# Patient Record
Sex: Female | Born: 1945 | Race: White | Hispanic: No | Marital: Married | Smoking: Never smoker
Health system: Southern US, Community
[De-identification: ages and names within clinical notes are randomized; demographics above are authoritative.]

## PROBLEM LIST (undated history)

## (undated) ENCOUNTER — Emergency Department (HOSPITAL_BASED_OUTPATIENT_CLINIC_OR_DEPARTMENT_OTHER): Admission: EM | Payer: Medicare HMO

## (undated) DIAGNOSIS — I340 Nonrheumatic mitral (valve) insufficiency: Secondary | ICD-10-CM

## (undated) DIAGNOSIS — E7800 Pure hypercholesterolemia, unspecified: Secondary | ICD-10-CM

## (undated) DIAGNOSIS — J3489 Other specified disorders of nose and nasal sinuses: Secondary | ICD-10-CM

## (undated) DIAGNOSIS — R51 Headache: Secondary | ICD-10-CM

## (undated) DIAGNOSIS — F329 Major depressive disorder, single episode, unspecified: Secondary | ICD-10-CM

## (undated) DIAGNOSIS — G4730 Sleep apnea, unspecified: Secondary | ICD-10-CM

## (undated) DIAGNOSIS — I10 Essential (primary) hypertension: Secondary | ICD-10-CM

## (undated) DIAGNOSIS — I2510 Atherosclerotic heart disease of native coronary artery without angina pectoris: Secondary | ICD-10-CM

## (undated) DIAGNOSIS — F0390 Unspecified dementia without behavioral disturbance: Secondary | ICD-10-CM

## (undated) DIAGNOSIS — E119 Type 2 diabetes mellitus without complications: Secondary | ICD-10-CM

## (undated) DIAGNOSIS — E039 Hypothyroidism, unspecified: Secondary | ICD-10-CM

## (undated) DIAGNOSIS — M1990 Unspecified osteoarthritis, unspecified site: Secondary | ICD-10-CM

## (undated) DIAGNOSIS — K219 Gastro-esophageal reflux disease without esophagitis: Secondary | ICD-10-CM

## (undated) DIAGNOSIS — F418 Other specified anxiety disorders: Secondary | ICD-10-CM

## (undated) DIAGNOSIS — I700 Atherosclerosis of aorta: Secondary | ICD-10-CM

## (undated) DIAGNOSIS — R011 Cardiac murmur, unspecified: Secondary | ICD-10-CM

## (undated) HISTORY — DX: Atherosclerotic heart disease of native coronary artery without angina pectoris: I25.10

## (undated) HISTORY — PX: BREAST CYST EXCISION: SHX579

---

## 1997-07-13 ENCOUNTER — Observation Stay (HOSPITAL_COMMUNITY): Admission: EM | Admit: 1997-07-13 | Discharge: 1997-07-14 | Payer: Self-pay | Admitting: Emergency Medicine

## 1998-01-17 ENCOUNTER — Encounter: Payer: Self-pay | Admitting: Gastroenterology

## 1998-01-17 ENCOUNTER — Ambulatory Visit (HOSPITAL_COMMUNITY): Admission: RE | Admit: 1998-01-17 | Discharge: 1998-01-17 | Payer: Self-pay | Admitting: Gastroenterology

## 1998-02-07 ENCOUNTER — Ambulatory Visit (HOSPITAL_COMMUNITY): Admission: RE | Admit: 1998-02-07 | Discharge: 1998-02-07 | Payer: Self-pay

## 1998-02-28 ENCOUNTER — Ambulatory Visit (HOSPITAL_COMMUNITY): Admission: RE | Admit: 1998-02-28 | Discharge: 1998-03-01 | Payer: Self-pay

## 2010-10-15 ENCOUNTER — Other Ambulatory Visit: Payer: Self-pay | Admitting: Internal Medicine

## 2010-10-15 DIAGNOSIS — G44009 Cluster headache syndrome, unspecified, not intractable: Secondary | ICD-10-CM

## 2010-10-17 ENCOUNTER — Ambulatory Visit
Admission: RE | Admit: 2010-10-17 | Discharge: 2010-10-17 | Disposition: A | Discharge status: Home / Self Care | Payer: BC Managed Care – PPO | Source: Ambulatory Visit | Attending: Internal Medicine | Admitting: Internal Medicine

## 2010-10-17 DIAGNOSIS — G44009 Cluster headache syndrome, unspecified, not intractable: Secondary | ICD-10-CM

## 2010-12-05 ENCOUNTER — Encounter (HOSPITAL_COMMUNITY): Payer: Self-pay

## 2011-02-06 ENCOUNTER — Encounter (HOSPITAL_COMMUNITY): Payer: Self-pay

## 2011-02-07 NOTE — H&P (Signed)
Danielle [name] is an 66 y.o. female.    Chief Complaint: left Hip OA and pain   HPI: Pt is a 66 y.o. female complaining of left hip pain for 5 years. Pain had continually increased since the beginning, significantly increasing over the last couple of weeks. X-rays in the clinic show end-stage arthritic changes of the left hip. Pt has tried various conservative treatments which have failed to alleviate their symptoms including intraarticular injections and NSAIDs. Various options are discussed with the patient. Risks, benefits and expectations were discussed with the patient. Patient understand the risks, benefits and expectations and wishes to proceed with surgery.   PCP:  No primary provider on file.  D/C Plans:  Home with HHPT  Post-op Meds:   Rx given for ASA, Robaxin, Iron, Colace and MiraLax  Tranexamic Acid:  To be given  PMH: HTN Anxiety / Depression Sleep Apnea Glaucoma Heart Murmur GERD Thyroid Disease   PSH: T&A Hysterectomy, partial Removal of ovaries,  fibroid tumors Appendectomy Discectomy C5-C6 Cholecystectomy Right THA   Social History: Denies the use of tobacco. Admits to the rare alcohol drink.  Allergies:  Allergies  Allergen Reactions  . Codeine Nausea Only  . Compazine (Prochlorperazine) Swelling and Other (See Comments)    Face and tongue swelling  Ceftin - rash  Medications: No current facility-administered medications for this encounter.   Current Outpatient Prescriptions  Medication Sig Dispense Refill  . atenolol (TENORMIN) 25 MG tablet Take 25 mg by mouth every morning.        . calcium-vitamin D (OSCAL WITH D) 500-200 MG-UNIT per tablet Take 1 tablet by mouth 2 (two) times daily.        . cetirizine (ZYRTEC) 10 MG tablet Take 10 mg by mouth daily as needed. For allergies       . DULoxetine (CYMBALTA) 30 MG capsule Take 30 mg by mouth every morning.        . hydrochlorothiazide (MICROZIDE) 12.5 MG capsule Take 12.5 mg by mouth every  morning.        Marland Kitchen HYDROcodone-acetaminophen (VICODIN) 5-500 MG per tablet Take 1 tablet by mouth every 6 (six) hours as needed. For hip pain       . levothyroxine (SYNTHROID, LEVOTHROID) 112 MCG tablet Take 112 mcg by mouth every morning.        . magnesium oxide (MAG-OX) 400 MG tablet Take 400 mg by mouth 2 (two) times daily.        Marland Kitchen omeprazole (PRILOSEC) 20 MG capsule Take 20 mg by mouth daily.        . simvastatin (ZOCOR) 40 MG tablet Take 40 mg by mouth every evening.        . travoprost, benzalkonium, (TRAVATAN) 0.004 % ophthalmic solution Place 1 drop into both eyes at bedtime.         ROS: Review of Systems  Constitutional: Negative.   HENT: Negative.   Eyes: Negative.   Respiratory: Positive for shortness of breath (on exertion).   Cardiovascular: Positive for palpitations.  Gastrointestinal: Positive for heartburn.  Genitourinary: Negative.   Musculoskeletal: Positive for joint pain.  Skin: Negative.   Neurological: Negative.   Endo/Heme/Allergies: Negative.   Psychiatric/Behavioral: Negative.      Physican Exam: Physical Exam  Constitutional: She is oriented to person, place, and time and well-developed, well-nourished, and in no distress.  HENT:  Head: Normocephalic and atraumatic.  Nose: Nose normal.  Mouth/Throat: Oropharynx is clear and moist.  Eyes: Pupils are equal, round, and  reactive to light.  Neck: Neck supple. No JVD present. No tracheal deviation present. No thyromegaly present.  Cardiovascular: Normal rate, regular rhythm and intact distal pulses.   Murmur heard. Pulmonary/Chest: Effort normal and breath sounds normal. No stridor.  Abdominal: Soft. There is no tenderness. There is no guarding.  Musculoskeletal:       Left hip: She exhibits decreased range of motion (Good internal rotation. Decreased external rotation, abduction and flexion.), decreased strength, tenderness and bony tenderness. She exhibits no swelling.  Lymphadenopathy:    She has no  cervical adenopathy.  Neurological: She is alert and oriented to person, place, and time.  Skin: Skin is warm and dry. No rash noted. No erythema. No pallor.  Psychiatric: Affect normal.     Assessment/Plan Assessment: left Hip OA and pain   Plan: Patient will undergo a left total hip arthroplasty, anterior approach on 02/17/2011. Risks benefits and expectation were discussed with the patient. Patient understand risks, benefits and expectation and wishes to proceed.   Anastasio Auerbach Braileigh Landenberger   PAC  02/07/2011, 7:29 PM

## 2011-02-11 ENCOUNTER — Inpatient Hospital Stay (HOSPITAL_COMMUNITY): Admission: RE | Admit: 2011-02-11 | Payer: BC Managed Care – PPO | Source: Ambulatory Visit

## 2011-02-13 ENCOUNTER — Encounter (HOSPITAL_COMMUNITY): Payer: Self-pay

## 2011-02-13 ENCOUNTER — Encounter (HOSPITAL_COMMUNITY)
Admission: RE | Admit: 2011-02-13 | Discharge: 2011-02-13 | Disposition: A | Discharge status: Home / Self Care | Payer: Medicare Other | Source: Ambulatory Visit | Attending: Orthopedic Surgery | Admitting: Orthopedic Surgery

## 2011-02-13 ENCOUNTER — Ambulatory Visit (HOSPITAL_COMMUNITY)
Admission: RE | Admit: 2011-02-13 | Discharge: 2011-02-13 | Disposition: A | Discharge status: Home / Self Care | Payer: Medicare Other | Source: Ambulatory Visit | Attending: Orthopedic Surgery | Admitting: Orthopedic Surgery

## 2011-02-13 ENCOUNTER — Other Ambulatory Visit: Payer: Self-pay

## 2011-02-13 DIAGNOSIS — K219 Gastro-esophageal reflux disease without esophagitis: Secondary | ICD-10-CM

## 2011-02-13 DIAGNOSIS — Z01810 Encounter for preprocedural cardiovascular examination: Secondary | ICD-10-CM | POA: Insufficient documentation

## 2011-02-13 DIAGNOSIS — I10 Essential (primary) hypertension: Secondary | ICD-10-CM | POA: Diagnosis present

## 2011-02-13 DIAGNOSIS — Z01818 Encounter for other preprocedural examination: Secondary | ICD-10-CM | POA: Diagnosis not present

## 2011-02-13 DIAGNOSIS — R011 Cardiac murmur, unspecified: Secondary | ICD-10-CM

## 2011-02-13 DIAGNOSIS — Z01812 Encounter for preprocedural laboratory examination: Secondary | ICD-10-CM | POA: Diagnosis not present

## 2011-02-13 DIAGNOSIS — M1990 Unspecified osteoarthritis, unspecified site: Secondary | ICD-10-CM

## 2011-02-13 DIAGNOSIS — E039 Hypothyroidism, unspecified: Secondary | ICD-10-CM

## 2011-02-13 DIAGNOSIS — G4730 Sleep apnea, unspecified: Secondary | ICD-10-CM

## 2011-02-13 DIAGNOSIS — M169 Osteoarthritis of hip, unspecified: Secondary | ICD-10-CM | POA: Diagnosis not present

## 2011-02-13 DIAGNOSIS — Z01811 Encounter for preprocedural respiratory examination: Secondary | ICD-10-CM | POA: Diagnosis not present

## 2011-02-13 DIAGNOSIS — J3489 Other specified disorders of nose and nasal sinuses: Secondary | ICD-10-CM

## 2011-02-13 DIAGNOSIS — M1610 Unilateral primary osteoarthritis, unspecified hip: Secondary | ICD-10-CM | POA: Insufficient documentation

## 2011-02-13 DIAGNOSIS — G4733 Obstructive sleep apnea (adult) (pediatric): Secondary | ICD-10-CM | POA: Diagnosis present

## 2011-02-13 DIAGNOSIS — R51 Headache: Secondary | ICD-10-CM

## 2011-02-13 HISTORY — DX: Unspecified osteoarthritis, unspecified site: M19.90

## 2011-02-13 HISTORY — DX: Headache: R51

## 2011-02-13 HISTORY — DX: Sleep apnea, unspecified: G47.30

## 2011-02-13 HISTORY — DX: Other specified disorders of nose and nasal sinuses: J34.89

## 2011-02-13 HISTORY — PX: GUM SURGERY: SHX658

## 2011-02-13 HISTORY — PX: JOINT REPLACEMENT: SHX530

## 2011-02-13 HISTORY — PX: CERVICAL FUSION: SHX112

## 2011-02-13 HISTORY — DX: Essential (primary) hypertension: I10

## 2011-02-13 HISTORY — DX: Gastro-esophageal reflux disease without esophagitis: K21.9

## 2011-02-13 HISTORY — DX: Cardiac murmur, unspecified: R01.1

## 2011-02-13 HISTORY — PX: ABDOMINAL HYSTERECTOMY: SHX81

## 2011-02-13 HISTORY — DX: Hypothyroidism, unspecified: E03.9

## 2011-02-13 HISTORY — PX: CHOLECYSTECTOMY: SHX55

## 2011-02-13 HISTORY — PX: APPENDECTOMY: SHX54

## 2011-02-13 HISTORY — PX: TONSILLECTOMY: SUR1361

## 2011-02-13 HISTORY — PX: TUBAL LIGATION: SHX77

## 2011-02-13 LAB — DIFFERENTIAL
Basophils Absolute: 0.1 10*3/uL (ref 0.0–0.1)
Eosinophils Relative: 4 % (ref 0–5)
Lymphocytes Relative: 40 % (ref 12–46)
Neutro Abs: 2.6 10*3/uL (ref 1.7–7.7)
Neutrophils Relative %: 47 % (ref 43–77)

## 2011-02-13 LAB — URINE MICROSCOPIC-ADD ON

## 2011-02-13 LAB — URINALYSIS, ROUTINE W REFLEX MICROSCOPIC
Bilirubin Urine: NEGATIVE
Hgb urine dipstick: NEGATIVE
Ketones, ur: NEGATIVE mg/dL
Specific Gravity, Urine: 1.019 (ref 1.005–1.030)
Urobilinogen, UA: 0.2 mg/dL (ref 0.0–1.0)

## 2011-02-13 LAB — CBC
MCV: 88.3 fL (ref 78.0–100.0)
Platelets: 275 10*3/uL (ref 150–400)
RBC: 4.69 MIL/uL (ref 3.87–5.11)
RDW: 13.2 % (ref 11.5–15.5)
WBC: 5.4 10*3/uL (ref 4.0–10.5)

## 2011-02-13 LAB — BASIC METABOLIC PANEL
Calcium: 9.8 mg/dL (ref 8.4–10.5)
Creatinine, Ser: 0.64 mg/dL (ref 0.50–1.10)
GFR calc Af Amer: >90 mL/min (ref >90)
GFR calc non Af Amer: >90 mL/min (ref >90)
Sodium: 138 mEq/L (ref 135–145)

## 2011-02-13 LAB — PROTIME-INR
INR: 0.91 (ref 0.00–1.49)
Prothrombin Time: 12.4 seconds (ref 11.6–15.2)

## 2011-02-13 LAB — APTT: aPTT: 34 seconds (ref 24–37)

## 2011-02-13 LAB — SURGICAL PCR SCREEN: Staphylococcus aureus: NEGATIVE

## 2011-02-13 MED ORDER — CHLORHEXIDINE GLUCONATE 4 % EX LIQD
60.0000 mL | Freq: Once | CUTANEOUS | Status: DC
  Filled 2011-02-13: qty 60

## 2011-02-13 NOTE — Patient Instructions (Signed)
21 Danielle [name]  02/13/2011   Your procedure is scheduled on: 02-17-11  Report to [facility] at   0630     AM.  Call this number if you have problems the morning of surgery: [phone]   Remember:   Do not eat food:After Midnight.  May have clear liquids:up to 6 Hours before arrival. 11 Midnight.  Clear liquids include soda, tea, black coffee, apple or grape juice, broth.  Take these medicines the morning of surgery with A SIP OF WATER: Atenolol, Zyrtec, Cymbalta, Levothyroxine, Prislosec. Use and bring Travatan eye drops. Bring Cpap mask (not machine).   Do not wear jewelry, make-up or nail polish.  Do not wear lotions, powders, or perfumes. You may wear deodorant.  Do not shave 48 hours prior to surgery.  Do not bring valuables to the hospital.  Contacts, dentures or bridgework may not be worn into surgery.  Leave suitcase in the car. After surgery it may be brought to your room.  For patients admitted to the hospital, checkout time is 11:00 AM the day of discharge.   Patients discharged the day of surgery will not be allowed to drive home.  Name and phone number of your driver: fiancee or son  Special Instructions: CHG Shower Use Special Wash: 1/2 bottle night before surgery and 1/2 bottle morning of surgery.   Please read over the following fact sheets that you were given: MRSA Information, Blood Transfusion fact sheet , Incentive Spirometry Instruction

## 2011-02-13 NOTE — Pre-Procedure Instructions (Addendum)
02-13-11 EKG/ CXR done today. 02-13-11 Dr. Leta Jungling given update per phone-pt. States previous intubation issues with past surgeries(sleep apnea,cervical fusion,small mouth)-no specifics given.Pt. Does desire spinal anesthesia. Dr. Leta Jungling says pt. Can be seen AM of

## 2011-02-16 DIAGNOSIS — E785 Hyperlipidemia, unspecified: Secondary | ICD-10-CM | POA: Diagnosis not present

## 2011-02-16 DIAGNOSIS — E039 Hypothyroidism, unspecified: Secondary | ICD-10-CM | POA: Diagnosis not present

## 2011-02-17 ENCOUNTER — Encounter (HOSPITAL_COMMUNITY)
Admission: RE | Disposition: A | Discharge status: Home Health | Payer: Self-pay | Source: Ambulatory Visit | Attending: Orthopedic Surgery

## 2011-02-17 ENCOUNTER — Inpatient Hospital Stay (HOSPITAL_COMMUNITY): Payer: Medicare Other | Admitting: Anesthesiology

## 2011-02-17 ENCOUNTER — Ambulatory Visit (HOSPITAL_COMMUNITY): Payer: Medicare Other

## 2011-02-17 ENCOUNTER — Inpatient Hospital Stay (HOSPITAL_COMMUNITY)
Admission: RE | Admit: 2011-02-17 | Discharge: 2011-02-19 | DRG: 470 | Disposition: A | Discharge status: Home Health | Payer: Medicare Other | Source: Ambulatory Visit | Attending: Orthopedic Surgery | Admitting: Orthopedic Surgery

## 2011-02-17 ENCOUNTER — Encounter (HOSPITAL_COMMUNITY): Payer: Self-pay | Admitting: Anesthesiology

## 2011-02-17 ENCOUNTER — Encounter (HOSPITAL_COMMUNITY): Payer: Self-pay | Admitting: *Deleted

## 2011-02-17 DIAGNOSIS — M1610 Unilateral primary osteoarthritis, unspecified hip: Secondary | ICD-10-CM | POA: Diagnosis not present

## 2011-02-17 DIAGNOSIS — F341 Dysthymic disorder: Secondary | ICD-10-CM | POA: Diagnosis present

## 2011-02-17 DIAGNOSIS — Z96649 Presence of unspecified artificial hip joint: Secondary | ICD-10-CM

## 2011-02-17 DIAGNOSIS — H409 Unspecified glaucoma: Secondary | ICD-10-CM | POA: Diagnosis present

## 2011-02-17 DIAGNOSIS — Z79899 Other long term (current) drug therapy: Secondary | ICD-10-CM

## 2011-02-17 DIAGNOSIS — M169 Osteoarthritis of hip, unspecified: Principal | ICD-10-CM | POA: Diagnosis present

## 2011-02-17 DIAGNOSIS — R011 Cardiac murmur, unspecified: Secondary | ICD-10-CM | POA: Diagnosis present

## 2011-02-17 DIAGNOSIS — G4730 Sleep apnea, unspecified: Secondary | ICD-10-CM | POA: Diagnosis not present

## 2011-02-17 DIAGNOSIS — I10 Essential (primary) hypertension: Secondary | ICD-10-CM | POA: Diagnosis not present

## 2011-02-17 DIAGNOSIS — E039 Hypothyroidism, unspecified: Secondary | ICD-10-CM | POA: Diagnosis present

## 2011-02-17 DIAGNOSIS — Z471 Aftercare following joint replacement surgery: Secondary | ICD-10-CM | POA: Diagnosis not present

## 2011-02-17 HISTORY — PX: TOTAL HIP ARTHROPLASTY: SHX124

## 2011-02-17 LAB — TYPE AND SCREEN: Antibody Screen: NEGATIVE

## 2011-02-17 SURGERY — ARTHROPLASTY, HIP, TOTAL, ANTERIOR APPROACH
Anesthesia: Spinal | Site: Hip | Laterality: Left | Wound class: Clean

## 2011-02-17 MED ORDER — PANTOPRAZOLE SODIUM 40 MG PO TBEC
40.0000 mg | DELAYED_RELEASE_TABLET | Freq: Every day | ORAL | Status: DC
  Administered 2011-02-18 – 2011-02-19 (×2): 40 mg via ORAL
  Filled 2011-02-17 (×2): qty 1

## 2011-02-17 MED ORDER — FENTANYL CITRATE 0.05 MG/ML IJ SOLN
INTRAMUSCULAR | Status: DC | PRN
  Administered 2011-02-17: 100 ug via INTRAVENOUS

## 2011-02-17 MED ORDER — CEFAZOLIN SODIUM-DEXTROSE 2-3 GM-% IV SOLR: 2.0000 g | Freq: Once | INTRAVENOUS | Status: DC

## 2011-02-17 MED ORDER — ONDANSETRON HCL 4 MG/2ML IJ SOLN
INTRAMUSCULAR | Status: DC | PRN
  Administered 2011-02-17: 4 mg via INTRAVENOUS

## 2011-02-17 MED ORDER — PROMETHAZINE HCL 25 MG/ML IJ SOLN: 6.2500 mg | INTRAMUSCULAR | Status: DC | PRN

## 2011-02-17 MED ORDER — METHOCARBAMOL 100 MG/ML IJ SOLN
500.0000 mg | Freq: Four times a day (QID) | INTRAVENOUS | Status: DC | PRN
  Administered 2011-02-17: 500 mg via INTRAVENOUS
  Filled 2011-02-17: qty 5

## 2011-02-17 MED ORDER — LIDOCAINE HCL (CARDIAC) 20 MG/ML IV SOLN
INTRAVENOUS | Status: DC | PRN
  Administered 2011-02-17: 100 mg via INTRAVENOUS

## 2011-02-17 MED ORDER — ACETAMINOPHEN 10 MG/ML IV SOLN
INTRAVENOUS | Status: DC | PRN
  Administered 2011-02-17: 1000 mg via INTRAVENOUS

## 2011-02-17 MED ORDER — HETASTARCH-ELECTROLYTES 6 % IV SOLN
INTRAVENOUS | Status: DC | PRN
  Administered 2011-02-17: 10:00:00 via INTRAVENOUS

## 2011-02-17 MED ORDER — KETAMINE HCL 10 MG/ML IJ SOLN
INTRAMUSCULAR | Status: DC | PRN
  Administered 2011-02-17 (×2): 10 mg via INTRAVENOUS

## 2011-02-17 MED ORDER — PROPOFOL 10 MG/ML IV BOLUS
INTRAVENOUS | Status: DC | PRN
  Administered 2011-02-17: 20 mg via INTRAVENOUS

## 2011-02-17 MED ORDER — ZOLPIDEM TARTRATE 5 MG PO TABS: 5.0000 mg | ORAL_TABLET | Freq: Every evening | ORAL | Status: DC | PRN

## 2011-02-17 MED ORDER — ONDANSETRON HCL 4 MG/2ML IJ SOLN: 4.0000 mg | Freq: Four times a day (QID) | INTRAMUSCULAR | Status: DC | PRN

## 2011-02-17 MED ORDER — MIDAZOLAM HCL 5 MG/5ML IJ SOLN
INTRAMUSCULAR | Status: DC | PRN
  Administered 2011-02-17: 1 mg via INTRAVENOUS
  Administered 2011-02-17: 2 mg via INTRAVENOUS
  Administered 2011-02-17: 1 mg via INTRAVENOUS

## 2011-02-17 MED ORDER — RIVAROXABAN 10 MG PO TABS
10.0000 mg | ORAL_TABLET | ORAL | Status: DC
  Administered 2011-02-18 – 2011-02-19 (×2): 10 mg via ORAL
  Filled 2011-02-17 (×2): qty 1

## 2011-02-17 MED ORDER — HYDROMORPHONE HCL PF 1 MG/ML IJ SOLN
INTRAMUSCULAR | Status: AC
  Administered 2011-02-18: 1 mg via INTRAVENOUS
  Filled 2011-02-17: qty 1

## 2011-02-17 MED ORDER — CLINDAMYCIN PHOSPHATE 600 MG/50ML IV SOLN
INTRAVENOUS | Status: DC | PRN
  Administered 2011-02-17: 600 mg via INTRAVENOUS

## 2011-02-17 MED ORDER — HYDROCODONE-ACETAMINOPHEN 7.5-325 MG PO TABS
1.0000 | ORAL_TABLET | ORAL | Status: DC
  Administered 2011-02-18 – 2011-02-19 (×8): 2 via ORAL
  Filled 2011-02-17 (×8): qty 2

## 2011-02-17 MED ORDER — EPHEDRINE SULFATE 50 MG/ML IJ SOLN
INTRAMUSCULAR | Status: DC | PRN
  Administered 2011-02-17 (×3): 5 mg via INTRAVENOUS

## 2011-02-17 MED ORDER — MENTHOL 3 MG MT LOZG
1.0000 | LOZENGE | OROMUCOSAL | Status: DC | PRN
  Administered 2011-02-17: 3 mg via ORAL
  Filled 2011-02-17: qty 9

## 2011-02-17 MED ORDER — DIPHENHYDRAMINE HCL 25 MG PO CAPS
25.0000 mg | ORAL_CAPSULE | Freq: Four times a day (QID) | ORAL | Status: DC | PRN
  Administered 2011-02-18 – 2011-02-19 (×2): 25 mg via ORAL
  Filled 2011-02-17 (×2): qty 1

## 2011-02-17 MED ORDER — METHOCARBAMOL 500 MG PO TABS
500.0000 mg | ORAL_TABLET | Freq: Four times a day (QID) | ORAL | Status: DC | PRN
  Administered 2011-02-17 – 2011-02-19 (×2): 500 mg via ORAL
  Filled 2011-02-17 (×2): qty 1

## 2011-02-17 MED ORDER — METOCLOPRAMIDE HCL 10 MG PO TABS: 5.0000 mg | ORAL_TABLET | Freq: Three times a day (TID) | ORAL | Status: DC | PRN

## 2011-02-17 MED ORDER — PHENOL 1.4 % MT LIQD
1.0000 | OROMUCOSAL | Status: DC | PRN
  Filled 2011-02-17: qty 177

## 2011-02-17 MED ORDER — METOCLOPRAMIDE HCL 5 MG/ML IJ SOLN: 5.0000 mg | Freq: Three times a day (TID) | INTRAMUSCULAR | Status: DC | PRN

## 2011-02-17 MED ORDER — HYDROCODONE-ACETAMINOPHEN 7.5-325 MG PO TABS
1.0000 | ORAL_TABLET | ORAL | Status: DC
  Administered 2011-02-17 (×2): 2 via ORAL
  Filled 2011-02-17 (×2): qty 2

## 2011-02-17 MED ORDER — LORATADINE 10 MG PO TABS
10.0000 mg | ORAL_TABLET | Freq: Every day | ORAL | Status: DC
  Administered 2011-02-18 – 2011-02-19 (×2): 10 mg via ORAL
  Filled 2011-02-17 (×3): qty 1

## 2011-02-17 MED ORDER — FERROUS SULFATE 325 (65 FE) MG PO TABS
325.0000 mg | ORAL_TABLET | Freq: Three times a day (TID) | ORAL | Status: DC
  Administered 2011-02-17 – 2011-02-19 (×7): 325 mg via ORAL
  Filled 2011-02-17 (×8): qty 1

## 2011-02-17 MED ORDER — DOCUSATE SODIUM 100 MG PO CAPS
100.0000 mg | ORAL_CAPSULE | Freq: Two times a day (BID) | ORAL | Status: DC
  Administered 2011-02-17 – 2011-02-19 (×5): 100 mg via ORAL
  Filled 2011-02-17 (×6): qty 1

## 2011-02-17 MED ORDER — HYDROMORPHONE HCL PF 1 MG/ML IJ SOLN
0.2500 mg | INTRAMUSCULAR | Status: DC | PRN
  Administered 2011-02-17 (×2): 0.5 mg via INTRAVENOUS

## 2011-02-17 MED ORDER — CLINDAMYCIN PHOSPHATE 600 MG/50ML IV SOLN
600.0000 mg | Freq: Four times a day (QID) | INTRAVENOUS | Status: AC
  Administered 2011-02-17 – 2011-02-18 (×3): 600 mg via INTRAVENOUS
  Filled 2011-02-17 (×3): qty 50

## 2011-02-17 MED ORDER — LEVOTHYROXINE SODIUM 112 MCG PO TABS
112.0000 ug | ORAL_TABLET | Freq: Every day | ORAL | Status: DC
  Administered 2011-02-18 – 2011-02-19 (×2): 112 ug via ORAL
  Filled 2011-02-17 (×3): qty 1

## 2011-02-17 MED ORDER — TRANEXAMIC ACID 100 MG/ML IV SOLN
1400.0000 mg | Freq: Once | INTRAVENOUS | Status: AC
  Administered 2011-02-17: 1400 mg via INTRAVENOUS
  Filled 2011-02-17: qty 14

## 2011-02-17 MED ORDER — SODIUM CHLORIDE 0.9 % IV SOLN: INTRAVENOUS | Status: DC

## 2011-02-17 MED ORDER — DEXAMETHASONE SODIUM PHOSPHATE 10 MG/ML IJ SOLN
10.0000 mg | Freq: Once | INTRAMUSCULAR | Status: AC
  Administered 2011-02-18: 10 mg via INTRAVENOUS
  Filled 2011-02-17: qty 1

## 2011-02-17 MED ORDER — ONDANSETRON HCL 4 MG PO TABS: 4.0000 mg | ORAL_TABLET | Freq: Four times a day (QID) | ORAL | Status: DC | PRN

## 2011-02-17 MED ORDER — LACTATED RINGERS IV SOLN
INTRAVENOUS | Status: DC | PRN
  Administered 2011-02-17 (×2): via INTRAVENOUS

## 2011-02-17 MED ORDER — ACETAMINOPHEN 325 MG PO TABS: 650.0000 mg | ORAL_TABLET | Freq: Four times a day (QID) | ORAL | Status: DC | PRN

## 2011-02-17 MED ORDER — ALUM & MAG HYDROXIDE-SIMETH 200-200-20 MG/5ML PO SUSP: 30.0000 mL | ORAL | Status: DC | PRN

## 2011-02-17 MED ORDER — DEXAMETHASONE SODIUM PHOSPHATE 4 MG/ML IJ SOLN
INTRAMUSCULAR | Status: DC | PRN
  Administered 2011-02-17: 10 mg via INTRAVENOUS

## 2011-02-17 MED ORDER — SODIUM CHLORIDE 0.9 % IV SOLN
100.0000 mL/h | INTRAVENOUS | Status: DC
  Administered 2011-02-17 – 2011-02-18 (×3): 100 mL/h via INTRAVENOUS
  Filled 2011-02-17 (×9): qty 1000

## 2011-02-17 MED ORDER — POLYETHYLENE GLYCOL 3350 17 G PO PACK
17.0000 g | PACK | Freq: Two times a day (BID) | ORAL | Status: DC
  Administered 2011-02-17 – 2011-02-19 (×5): 17 g via ORAL
  Filled 2011-02-17 (×6): qty 1

## 2011-02-17 MED ORDER — SIMVASTATIN 40 MG PO TABS
40.0000 mg | ORAL_TABLET | Freq: Every evening | ORAL | Status: DC
  Administered 2011-02-18: 40 mg via ORAL
  Filled 2011-02-17 (×3): qty 1

## 2011-02-17 MED ORDER — ACETAMINOPHEN 650 MG RE SUPP: 650.0000 mg | Freq: Four times a day (QID) | RECTAL | Status: DC | PRN

## 2011-02-17 MED ORDER — BISACODYL 5 MG PO TBEC: 5.0000 mg | DELAYED_RELEASE_TABLET | Freq: Every day | ORAL | Status: DC | PRN

## 2011-02-17 MED ORDER — PROPOFOL 10 MG/ML IV EMUL
INTRAVENOUS | Status: DC | PRN
  Administered 2011-02-17: 50 ug/kg/min via INTRAVENOUS

## 2011-02-17 MED ORDER — DULOXETINE HCL 30 MG PO CPEP
30.0000 mg | ORAL_CAPSULE | Freq: Every day | ORAL | Status: DC
  Administered 2011-02-18 – 2011-02-19 (×2): 30 mg via ORAL
  Filled 2011-02-17 (×3): qty 1

## 2011-02-17 MED ORDER — TRAVOPROST (BAK FREE) 0.004 % OP SOLN
1.0000 [drp] | Freq: Every day | OPHTHALMIC | Status: DC
  Administered 2011-02-17: 1 [drp] via OPHTHALMIC
  Filled 2011-02-17: qty 2.5

## 2011-02-17 MED ORDER — ATENOLOL 25 MG PO TABS
25.0000 mg | ORAL_TABLET | Freq: Every day | ORAL | Status: DC
  Administered 2011-02-19: 25 mg via ORAL
  Filled 2011-02-17 (×3): qty 1

## 2011-02-17 MED ORDER — HYDROCHLOROTHIAZIDE 12.5 MG PO CAPS
12.5000 mg | ORAL_CAPSULE | Freq: Every day | ORAL | Status: DC
  Filled 2011-02-17 (×3): qty 1

## 2011-02-17 MED ORDER — FLEET ENEMA 7-19 GM/118ML RE ENEM: 1.0000 | ENEMA | Freq: Once | RECTAL | Status: AC | PRN

## 2011-02-17 MED ORDER — HYDROMORPHONE HCL PF 1 MG/ML IJ SOLN
0.5000 mg | INTRAMUSCULAR | Status: DC | PRN
  Administered 2011-02-17 – 2011-02-18 (×4): 1 mg via INTRAVENOUS
  Filled 2011-02-17 (×4): qty 1

## 2011-02-17 MED ORDER — CEFAZOLIN SODIUM-DEXTROSE 2-3 GM-% IV SOLR: 2.0000 g | Freq: Four times a day (QID) | INTRAVENOUS | Status: DC

## 2011-02-17 MED ORDER — DEXAMETHASONE SODIUM PHOSPHATE 10 MG/ML IJ SOLN
10.0000 mg | Freq: Once | INTRAMUSCULAR | Status: DC
  Filled 2011-02-17: qty 1

## 2011-02-17 SURGICAL SUPPLY — 42 items
ADH SKN CLS APL DERMABOND .7 (GAUZE/BANDAGES/DRESSINGS) ×1
BAG SPEC THK2 15X12 ZIP CLS (MISCELLANEOUS) ×2
BAG ZIPLOCK 12X15 (MISCELLANEOUS) ×4 IMPLANT
BLADE SAW SGTL 18X1.27X75 (BLADE) ×2 IMPLANT
CELLS DAT CNTRL 66122 CELL SVR (MISCELLANEOUS) ×1 IMPLANT
CLOTH BEACON ORANGE TIMEOUT ST (SAFETY) ×2 IMPLANT
DERMABOND ADVANCED (GAUZE/BANDAGES/DRESSINGS) ×1
DERMABOND ADVANCED .7 DNX12 (GAUZE/BANDAGES/DRESSINGS) ×1 IMPLANT
DRAPE C-ARM 42X72 X-RAY (DRAPES) ×2 IMPLANT
DRAPE STERI IOBAN 125X83 (DRAPES) ×2 IMPLANT
DRAPE U-SHAPE 47X51 STRL (DRAPES) ×6 IMPLANT
DRSG AQUACEL AG ADV 3.5X10 (GAUZE/BANDAGES/DRESSINGS) ×2 IMPLANT
DRSG TEGADERM 4X4.75 (GAUZE/BANDAGES/DRESSINGS) ×1 IMPLANT
DURAPREP 26ML APPLICATOR (WOUND CARE) ×2 IMPLANT
ELECT BLADE TIP CTD 4 INCH (ELECTRODE) ×3 IMPLANT
ELECT REM PT RETURN 9FT ADLT (ELECTROSURGICAL) ×2
ELECTRODE REM PT RTRN 9FT ADLT (ELECTROSURGICAL) ×1 IMPLANT
EVACUATOR 1/8 PVC DRAIN (DRAIN) IMPLANT
FACESHIELD LNG OPTICON STERILE (SAFETY) ×7 IMPLANT
GAUZE SPONGE 2X2 8PLY STRL LF (GAUZE/BANDAGES/DRESSINGS) ×1 IMPLANT
GLOVE BIOGEL PI IND STRL 7.5 (GLOVE) ×1 IMPLANT
GLOVE BIOGEL PI IND STRL 8 (GLOVE) ×1 IMPLANT
GLOVE BIOGEL PI INDICATOR 7.5 (GLOVE) ×1
GLOVE BIOGEL PI INDICATOR 8 (GLOVE) ×1
GLOVE ECLIPSE 8.0 STRL XLNG CF (GLOVE) ×2 IMPLANT
GLOVE ORTHO TXT STRL SZ7.5 (GLOVE) ×4 IMPLANT
GOWN BRE IMP PREV XXLGXLNG (GOWN DISPOSABLE) ×3 IMPLANT
GOWN STRL NON-REIN LRG LVL3 (GOWN DISPOSABLE) ×3 IMPLANT
KIT BASIN OR (CUSTOM PROCEDURE TRAY) ×2 IMPLANT
PACK TOTAL JOINT (CUSTOM PROCEDURE TRAY) ×2 IMPLANT
PADDING CAST COTTON 6X4 STRL (CAST SUPPLIES) ×2 IMPLANT
PENCIL BUTTON HOLSTER BLD 10FT (ELECTRODE) ×1 IMPLANT
RETRACTOR WND ALEXIS 18 MED (MISCELLANEOUS) ×1 IMPLANT
RTRCTR WOUND ALEXIS 18CM MED (MISCELLANEOUS) ×2
SPONGE GAUZE 2X2 STER 10/PKG (GAUZE/BANDAGES/DRESSINGS) ×1
SUCTION FRAZIER 12FR DISP (SUCTIONS) ×2 IMPLANT
SUT MNCRL AB 4-0 PS2 18 (SUTURE) ×2 IMPLANT
SUT VIC AB 1 CT1 36 (SUTURE) ×8 IMPLANT
SUT VIC AB 2-0 CT1 27 (SUTURE) ×4
SUT VIC AB 2-0 CT1 TAPERPNT 27 (SUTURE) ×2 IMPLANT
TOWEL OR 17X26 10 PK STRL BLUE (TOWEL DISPOSABLE) ×4 IMPLANT
TRAY FOLEY CATH 14FRSI W/METER (CATHETERS) ×2 IMPLANT

## 2011-02-17 NOTE — Anesthesia Preprocedure Evaluation (Signed)
Anesthesia Evaluation  Patient identified by MRN, date of birth, ID band Patient awake    Reviewed: Allergy & Precautions, H&P , NPO status , Patient's Chart, lab work & pertinent test results, reviewed documented beta blocker date and time   History of Anesthesia Complications (+) DIFFICULT AIRWAY  Airway  TM Distance: <3 FB Neck ROM: Limited    Dental  (+) Dental Advisory Given   Pulmonary sleep apnea and Continuous Positive Airway Pressure Ventilation ,  clear to auscultation        Cardiovascular hypertension, Pt. on medications Regular  Denies cardiac symptoms   Neuro/Psych Negative Neurological ROS  Negative Psych ROS   GI/Hepatic negative GI ROS, Neg liver ROS,   Endo/Other  Hypothyroidism Thyroid replacement  Renal/GU negative Renal ROS  Genitourinary negative   Musculoskeletal negative musculoskeletal ROS (+)   Abdominal   Peds negative pediatric ROS (+)  Hematology negative hematology ROS (+)   Anesthesia Other Findings Upper front caps  Reproductive/Obstetrics negative OB ROS                           Anesthesia Physical Anesthesia Plan  ASA: III  Anesthesia Plan: Spinal   Post-op Pain Management:    Induction: Intravenous  Airway Management Planned: Mask  Additional Equipment:   Intra-op Plan:   Post-operative Plan:   Informed Consent:   Dental advisory given  Plan Discussed with: CRNA and Surgeon  Anesthesia Plan Comments:         Anesthesia Quick Evaluation

## 2011-02-17 NOTE — Op Note (Signed)
NAME:  Danielle [name]                ACCOUNT NO.: [id number]      MEDICAL RECORD NO.: [id number]      FACILITY:  [facility]      PHYSICIAN:  Durene Romans D  DATE OF BIRTH:  August 30, 1945     DATE OF PROCEDURE:  02/17/2011                                 OPERATIVE REPORT         PREOPERATIVE DIAGNOSIS: Left  hip osteoarthritis.      POSTOPERATIVE DIAGNOSIS:  Left osteoarthritis.      PROCEDURE:  Left total hip replacement through an anterior approach   utilizing DePuy THR system, component size 52 pinnacle cup, a size 36+4 neutral   Altrex liner, a size 2Hi Tri Lock stem with a 36+1.5 delta ceramic   ball.      SURGEON:  Madlyn Frankel. Charlann Boxer, M.D.      ASSISTANT:  Lanney Gins, PA      ANESTHESIA:  General.      SPECIMENS:  None.      COMPLICATIONS:  None.      BLOOD LOSS:  500 cc     DRAINS:  One Hemovac.      INDICATION OF THE PROCEDURE:  Danielle [name] is a 66 y.o. female who had   presented to office for evaluation of left hip pain.  Radiographs revealed   progressive degenerative changes with bone-on-bone   articulation to the  hip joint.  The patient had painful limited range of   motion significantly affecting their overall quality of life.  The patient was failing to    respond to conservative measures, and at this point was ready   to proceed with more definitive measures.  The patient has noted progressive   degenerative changes in his hip, progressive problems and dysfunction   with regarding the hip prior to surgery.  Consent was obtained for   benefit of pain relief.  Specific risk of infection, DVT, component   failure, dislocation, need for revision surgery, as well discussion of   the anterior versus posterior approach were reviewed.  Consent was   obtained for benefit of anterior pain relief through an anterior   approach.      PROCEDURE IN DETAIL:  The patient was brought to operative theater.   Once adequate anesthesia, preoperative antibiotics, 600mg  of  Clindamycin administered.   The patient was positioned supine on the OSI Hanna table.  Once adequate   padding of boney process was carried out, we had predraped out the hip, and  used fluoroscopy to confirm orientation of the pelvis and position.      The left hip was then prepped and draped from proximal iliac crest to   mid thigh with shower curtain technique.      Time-out was performed identifying the patient, planned procedure, and   extremity.     An incision was then made 2 cm distal and lateral to the   anterior superior iliac spine extending over the orientation of the   tensor fascia lata muscle and sharp dissection was carried down to the   fascia of the muscle and protractor placed in the soft tissues.      The fascia was then incised.  The muscle belly was identified and swept   laterally  and retractor placed along the superior neck.  Following   cauterization of the circumflex vessels and removing some pericapsular   fat, a second cobra retractor was placed on the inferior neck.  A third   retractor was placed on the anterior acetabulum after elevating the   anterior rectus.  A L-capsulotomy was along the line of the   superior neck to the trochanteric fossa, then extended proximally and   distally.  Tag sutures were placed and the retractors were then placed   intracapsular.  We then identified the trochanteric fossa and   orientation of my neck cut, confirmed this radiographically   and then made a neck osteotomy with the femur on traction.  The femoral   head was removed without difficulty or complication.  Traction was let   off and retractors were placed posterior and anterior around the   acetabulum.      The labrum and foveal tissue were debrided.  I began reaming with a 45mm   reamer and reamed up to 51mm reamer with good bony bed preparation and a 52mm   cup was chosen.  The final 52 Pinnacle cup was then impacted under fluoroscopy  to confirm the depth of  penetration and orientation with respect to   abduction.  A screw was placed followed by the hole eliminator.  The final   36+4 Altrex liner was impacted with good visualized rim fit.  The cup was positioned anatomically within the acetabular portion of the pelvis.      At this point, the femur was rolled at 80 degrees.  Further capsule was   released off the inferior aspect of the femoral neck.  I then   released the superior capsule proximally.  The hook was placed laterally   along the femur and elevated manually and held in position with the bed   hook.  The leg was then extended and adducted with the leg rolled to 100   degrees of external rotation.  Once the proximal femur was fully   exposed, I used a box osteotome to set orientation.  I then began   broaching with the starting chili pepper broach and passed this by hand and then broached up to 2.  With the 2 broach in place I chose a high offset neck and did a trial reduction.  The offset was appropriate, leg lengths   appeared to be equal, confirmed radiographically.   Given these findings, I went ahead and dislocated the hip, repositioned all   retractors and positioned the right hip in the extended and abducted position.  The final 2Hi  Tri Lock stem was   chosen and it was impacted down to the level of neck cut.  Based on this   and the trial reduction, a 36+1.5 delta ceramic ball was chosen and   impacted onto a clean and dry trunnion, and the hip was reduced.  The   hip had been irrigated throughout the case again at this point.  I did   reapproximate the superior capsular leaflet to the anterior leaflet   using #1 Vicryl, placed a medium Hemovac drain deep.  The fascia of the   tensor fascia lata muscle was then reapproximated using #1 Vicryl.  The   remaining wound was closed with 2-0 Vicryl and running 4-0 Monocryl.   The hip was cleaned, dried, and dressed sterilely using Dermabond and   Aquacel dressing.  Drain site  dressed separately.  She was then brought  to recovery room in stable condition tolerating the procedure well.    Lanney Gins, PA-C was present for the entirety of the case involved from   preoperative positioning, perioperative retractor management, general   facilitation of the case, as well as primary wound closure as assistant.            Madlyn Frankel Charlann Boxer, M.D.            MDO/MEDQ  D:  11/04/2010  T:  11/04/2010  Job:  [id number]      Electronically Signed by Durene Romans M.D. on 11/10/2010 09:15:38 AM

## 2011-02-17 NOTE — Anesthesia Postprocedure Evaluation (Signed)
  Anesthesia Post-op Note  Patient: Danielle [name]  Procedure(s) Performed:  TOTAL HIP ARTHROPLASTY ANTERIOR APPROACH  Patient Location: PACU  Anesthesia Type: Spinal  Level of Consciousness: oriented and sedated  Airway and Oxygen Therapy: Patient Spontanous Breathing and Patient connected to nasal cannula oxygen  Post-op Pain: none  Post-op Assessment: Post-op Vital signs reviewed, Patient's Cardiovascular Status Stable, Respiratory Function Stable and Patent Airway  Post-op Vital Signs: stable  Complications: No apparent anesthesia complications

## 2011-02-17 NOTE — Anesthesia Postprocedure Evaluation (Signed)
  Anesthesia Post-op Note  Patient: Danielle [name]  Procedure(s) Performed:  TOTAL HIP ARTHROPLASTY ANTERIOR APPROACH  Patient Location: PACU  Anesthesia Type: General  Level of Consciousness: oriented and sedated  Airway and Oxygen Therapy: Patient Spontanous Breathing and Patient connected to nasal cannula oxygen  Post-op Pain: mild  Post-op Assessment: Post-op Vital signs reviewed, Patient's Cardiovascular Status Stable, Respiratory Function Stable and Patent Airway  Post-op Vital Signs: stable  Complications: No apparent anesthesia complications

## 2011-02-17 NOTE — Progress Notes (Signed)
Utilization review completed.  

## 2011-02-17 NOTE — Interval H&P Note (Signed)
History and Physical Interval Note:  02/17/2011 8:42 AM  Danielle [name]  has presented today for surgery, with the diagnosis of Osteoarthritis of the Left Hip  The various methods of treatment have been discussed with the patient and family. After consideration of risks, benefits and other options for treatment, the patient has consented to  Procedure(s:LEFT TOTAL HIP ARTHROPLASTY ANTERIOR APPROACH as a surgical intervention .  The patients' history has been reviewed, patient examined, no change in status, stable for surgery.  I have reviewed the patients' chart and labs.  Questions were answered to the patient's satisfaction.     [name]

## 2011-02-17 NOTE — Transfer of Care (Signed)
Immediate Anesthesia Transfer of Care Note  Patient: Danielle [name]  Procedure(s) Performed:  TOTAL HIP ARTHROPLASTY ANTERIOR APPROACH  Patient Location: PACU  Anesthesia Type: Regional  Level of Consciousness: awake and alert   Airway & Oxygen Therapy: Patient Spontanous Breathing and Patient connected to nasal cannula oxygen  Post-op Assessment: Report given to PACU RN and Post -op Vital signs reviewed and stable  Post vital signs: Reviewed and stable  Complications: No apparent anesthesia complications

## 2011-02-17 NOTE — Anesthesia Procedure Notes (Signed)
Spinal  Patient location during procedure: OR Start time: 02/17/2011 9:07 AM End time: 02/17/2011 9:17 AM Staffing Anesthesiologist: Lestine Box B Performed by: anesthesiologist  Preanesthetic Checklist Completed: patient identified, site marked, surgical consent, pre-op evaluation, timeout performed, IV checked, risks and benefits discussed and monitors and equipment checked Spinal Block Patient position: sitting Prep: Betadine Patient monitoring: heart rate, cardiac monitor, continuous pulse ox and blood pressure Approach: right paramedian Location: L3-4 Injection technique: single-shot Needle Needle type: Quincke  Needle gauge: 25 G Needle length: 9 cm Needle insertion depth: 3 cm Assessment Sensory level: T6 Additional Notes [id number], 2014-06

## 2011-02-18 LAB — BASIC METABOLIC PANEL
BUN: 11 mg/dL (ref 6–23)
Calcium: 8.6 mg/dL (ref 8.4–10.5)
Creatinine, Ser: 0.66 mg/dL (ref 0.50–1.10)
GFR calc non Af Amer: >90 mL/min (ref >90)
Glucose, Bld: 134 mg/dL — ABNORMAL HIGH (ref 70–99)
Potassium: 4.9 mEq/L (ref 3.5–5.1)

## 2011-02-18 LAB — CBC
HCT: 32.5 % — ABNORMAL LOW (ref 36.0–46.0)
Hemoglobin: 10.5 g/dL — ABNORMAL LOW (ref 12.0–15.0)
MCH: 29.7 pg (ref 26.0–34.0)
MCHC: 32.3 g/dL (ref 30.0–36.0)
MCV: 92.1 fL (ref 78.0–100.0)

## 2011-02-18 NOTE — Progress Notes (Signed)
Subjective: 1 Day Post-Op Procedure(s) (LRB): TOTAL HIP ARTHROPLASTY ANTERIOR APPROACH (Left)   Patient reports pain as mild. Pain is well controlled. No events.   Objective:   VITALS:   Filed Vitals:   02/18/11 0830  BP: 96/61  Pulse: 87  Temp: 97.7 F (36.5 C)  Resp: 15    Neurovascular intact Dorsiflexion/Plantar flexion intact Incision: dressing C/D/I No cellulitis present Compartment soft  LABS  Basename 02/18/11 0437  HGB 10.5*  HCT 32.5*  WBC 11.9*  PLT 231     Basename 02/18/11 0437  NA 135  K 4.9  BUN 11  CREATININE 0.66  GLUCOSE 134*     Assessment/Plan: 1 Day Post-Op Procedure(s) (LRB): TOTAL HIP ARTHROPLASTY ANTERIOR APPROACH (Left)   Foley cath d/'ed HV drain d/c'ed Advance diet Up with therapy D/C IV fluids Plan for discharge tomorrow to home if continues to do well.   Danielle [name]   PAC  02/18/2011, 9:00 AM

## 2011-02-18 NOTE — Progress Notes (Signed)
Physical Therapy Treatment Patient Details Name: Danielle [name] MRN: [id number] DOB: 1945-12-31 Today's Date: 02/18/2011  PT Assessment/Plan  PT - Assessment/Plan Comments on Treatment Session: pt feeling better this pm PT Frequency: 7X/week Follow Up Recommendations: Home health PT Equipment Recommended: None recommended by PT PT Goals  Acute Rehab PT Goals Pt will go Supine/Side to Sit: with modified independence PT Goal: Supine/Side to Sit - Progress: Progressing toward goal Pt will go Sit to Supine/Side: with modified independence PT Goal: Sit to Supine/Side - Progress: Progressing toward goal Pt will go Sit to Stand: with modified independence PT Goal: Sit to Stand - Progress: Progressing toward goal Pt will go Stand to Sit: with modified independence PT Goal: Stand to Sit - Progress: Progressing toward goal Pt will Ambulate: 51 - 150 feet;with rolling walker;with supervision;with modified independence PT Goal: Ambulate - Progress: Progressing toward goal  PT Treatment Precautions/Restrictions  Precautions Precautions:  (NONE) Restrictions Weight Bearing Restrictions: No LLE Weight Bearing: Weight bearing as tolerated Mobility (including Balance) Bed Mobility Supine to Sit: 4: Min assist;HOB flat Supine to Sit Details (indicate cue type and reason): min with LLE Sitting - Scoot to Edge of Bed: 5: Supervision Sit to Supine: HOB flat;4: Min assist Sit to Supine - Details (indicate cue type and reason): min assist with LLE Transfers Sit to Stand: 4: Min assist;From chair/3-in-1;With upper extremity assist Sit to Stand Details (indicate cue type and reason): cues for hand placement Stand to Sit: 4: Min assist;With upper extremity assist;To bed Stand to Sit Details: cues for hand placement Ambulation/Gait Ambulation/Gait Assistance: 4: Min assist;5: Supervision Ambulation/Gait Assistance Details (indicate cue type and reason): verbal cues for RW placement and  sequence Ambulation Distance (Feet): 240 Feet Assistive device: Rolling walker Gait Pattern: Step-to pattern;Antalgic Gait velocity: slow but increased from am    Exercise    End of Session PT - End of Session Equipment Utilized During Treatment: Gait belt Activity Tolerance: Patient tolerated treatment well Patient left: in bed;with call bell in reach;with family/visitor present Nurse Communication: Mobility status for transfers;Mobility status for ambulation General Behavior During Session: [facility] for tasks performed  [facility] 02/18/2011, 3:37 PM

## 2011-02-18 NOTE — Evaluation (Signed)
Physical Therapy Evaluation Patient Details Name: Danielle [name] MRN: [id number] DOB: Apr 25, 1945 Today's Date: 02/18/2011  Problem List:  Patient Active Problem List  Diagnoses  . S/P left hip replacement    Past Medical History:  Past Medical History  Diagnosis Date  . Difficult intubation 02-13-11    with surgery -multiple times  . Glaucoma 02-13-11    bil. tx. eye drops daily  . Sleep apnea 02-13-11    Hx. sleep apnea-uses cpap since 10'12  . Heart murmur 02-13-11    was told-no issues with this  . Sinus drainage 02-13-11    uses Zyrtec as needed  . Hypothyroidism 02-13-11    tx. with Levothyroxine  . GERD (gastroesophageal reflux disease) 02-13-11    tx. Omeprazole  . Headache 02-13-11    past hx. migraines, none recent  . Arthritis 02-13-11    osteoarthritis, hips, knees,s/p Cervical fusion(DDD)  . Hypertension 02-13-11    tx. Atenolol   Past Surgical History:  Past Surgical History  Procedure Date  . Abdominal hysterectomy 02-13-11  . Joint replacement 02-13-11    RTHA-a yr ago in [place]  . Cervical fusion 02-13-11    '92- retained hardware  . Cholecystectomy 02-13-11    '98-lap. galbladder removal due to stones  . Tubal ligation 02-13-11  . Appendectomy 02-13-11    Lap. removal '92  . Tonsillectomy 02-13-11    child  . Gum surgery 02-13-11    "small mouth"    PT Assessment/Plan/Recommendation PT Assessment Clinical Impression Statement: pt limited by nausea and dizziness this am; will benefit from PT to maximize independence for home PT Recommendation/Assessment: Patient will need skilled PT in the acute care venue PT Problem List: Decreased strength;Decreased range of motion;Decreased activity tolerance;Decreased mobility;Decreased knowledge of use of DME Barriers to Discharge: None PT Therapy Diagnosis : Difficulty walking PT Plan PT Frequency: 7X/week PT Treatment/Interventions: DME instruction;Gait training;Stair training;Functional mobility training;Therapeutic  activities;Therapeutic exercise;Patient/family education PT Recommendation Follow Up Recommendations: Home health PT Equipment Recommended: None recommended by PT PT Goals  Acute Rehab PT Goals PT Goal Formulation: With patient Time For Goal Achievement: 6 days Pt will go Supine/Side to Sit: with modified independence PT Goal: Supine/Side to Sit - Progress: Goal set today Pt will go Sit to Supine/Side: with modified independence PT Goal: Sit to Supine/Side - Progress: Goal set today Pt will go Sit to Stand: with modified independence PT Goal: Sit to Stand - Progress: Goal set today Pt will go Stand to Sit: with modified independence PT Goal: Stand to Sit - Progress: Goal set today Pt will Ambulate: 51 - 150 feet;with rolling walker;with supervision;with modified independence PT Goal: Ambulate - Progress: Goal set today Pt will Go Up / Down Stairs: 1-2 stairs;with min assist;with rolling walker PT Goal: Up/Down Stairs - Progress: Goal set today Pt will Perform Home Exercise Program: with supervision, verbal cues required/provided PT Goal: Perform Home Exercise Program - Progress: Goal set today  PT Evaluation Precautions/Restrictions  Precautions Precautions:  (NONE) Restrictions LLE Weight Bearing: Weight bearing as tolerated Prior Functioning  Home Living Lives With:  ( fiance) Receives Help From:  (fiance) Type of Home: Apartment Home Layout: One level Home Access: Other (comment) (curb only) Bathroom Shower/Tub: Tub/shower unit Home Adaptive Equipment: Walker - rolling;Bedside commode/3-in-1;Straight cane Prior Function Level of Independence: Independent with transfers;Requires assistive device for independence;Independent with gait;Independent with basic ADLs (uses cane occasionally) Able to Take Stairs?: Yes Cognition Cognition Arousal/Alertness: Awake/alert Overall Cognitive Status: Appears within functional limits for tasks  assessed Orientation Level: Oriented  X4 Sensation/Coordination   Extremity Assessment RUE Assessment RUE Assessment: Within Functional Limits LUE Assessment LUE Assessment: Within Functional Limits RLE Assessment RLE Assessment: Within Functional Limits (pt reports she does not "trust" R hip since THA 6yr ago) LLE Assessment LLE Assessment:  (AAROM WFL; >3/5hip, knee; ankle WFL) Mobility (including Balance) Bed Mobility Bed Mobility: Yes Supine to Sit: 4: Min assist;HOB flat Supine to Sit Details (indicate cue type and reason): min with LLE Sitting - Scoot to Edge of Bed: 5: Supervision Transfers Transfers: Yes Sit to Stand: 4: Min assist;From bed;From chair/3-in-1;With upper extremity assist Sit to Stand Details (indicate cue type and reason): cues for hand placement Stand to Sit: 4: Min assist;With armrests;With upper extremity assist;To chair/3-in-1 Stand to Sit Details: cues for hand placement Ambulation/Gait Ambulation/Gait: Yes Ambulation/Gait Assistance: 4: Min assist Ambulation/Gait Assistance Details (indicate cue type and reason): verbal cues for RW placement and sequence Ambulation Distance (Feet): 10 Feet (16) Assistive device: Rolling walker Gait Pattern: Step-to pattern;Antalgic Gait velocity: decreased    Exercise  Total Joint Exercises Ankle Circles/Pumps: AROM;10 reps;Both Quad Sets:  (further ther ex deferred d/t nausea) End of Session PT - End of Session Activity Tolerance: Treatment limited secondary to medical complications (Comment);Patient limited by fatigue (dizziness) Patient left: in chair;with call bell in reach;with family/visitor present Nurse Communication:  (nausea and dizziness; BP 101/63 EOB) General Behavior During Session: [facility] for tasks performed Cognition: [facility] for tasks performed  [facility] 02/18/2011, 11:00 AM

## 2011-02-19 LAB — CBC
HCT: 36.7 % (ref 36.0–46.0)
Hemoglobin: 11.8 g/dL — ABNORMAL LOW (ref 12.0–15.0)
MCV: 90.8 fL (ref 78.0–100.0)
RBC: 4.04 MIL/uL (ref 3.87–5.11)
WBC: 13.3 10*3/uL — ABNORMAL HIGH (ref 4.0–10.5)

## 2011-02-19 LAB — BASIC METABOLIC PANEL
BUN: 9 mg/dL (ref 6–23)
CO2: 26 mEq/L (ref 19–32)
Chloride: 106 mEq/L (ref 96–112)
Creatinine, Ser: 0.58 mg/dL (ref 0.50–1.10)
Potassium: 4.7 mEq/L (ref 3.5–5.1)

## 2011-02-19 MED ORDER — ASPIRIN EC 325 MG PO TBEC: 325.0000 mg | DELAYED_RELEASE_TABLET | Freq: Two times a day (BID) | ORAL | Status: AC

## 2011-02-19 MED ORDER — HYDROCODONE-ACETAMINOPHEN 7.5-325 MG PO TABS: 1.0000 | ORAL_TABLET | ORAL | Status: AC

## 2011-02-19 MED ORDER — FERROUS SULFATE 325 (65 FE) MG PO TABS: 325.0000 mg | ORAL_TABLET | Freq: Three times a day (TID) | ORAL | Status: DC

## 2011-02-19 MED ORDER — DIPHENHYDRAMINE HCL 25 MG PO CAPS: 25.0000 mg | ORAL_CAPSULE | Freq: Four times a day (QID) | ORAL | Status: AC | PRN

## 2011-02-19 MED ORDER — METHOCARBAMOL 500 MG PO TABS: 500.0000 mg | ORAL_TABLET | Freq: Four times a day (QID) | ORAL | Status: AC | PRN

## 2011-02-19 MED ORDER — POLYETHYLENE GLYCOL 3350 17 G PO PACK: 17.0000 g | PACK | Freq: Two times a day (BID) | ORAL | Status: AC

## 2011-02-19 MED ORDER — DSS 100 MG PO CAPS: 100.0000 mg | ORAL_CAPSULE | Freq: Two times a day (BID) | ORAL | Status: AC

## 2011-02-19 NOTE — Plan of Care (Signed)
Problem: Consults Goal: Diagnosis- Total Joint Replacement Outcome: Completed/Met Date Met:  02/19/11 Primary Total Hip Left Anterior

## 2011-02-19 NOTE — Progress Notes (Signed)
Subjective: 2 Days Post-Op Procedure(s) (LRB): TOTAL HIP ARTHROPLASTY ANTERIOR APPROACH (Left)   Patient reports pain as mild. Some redness, controlled with Benadryl. Otherwise no events.  Objective:   VITALS:   Filed Vitals:   02/19/11 0615  BP: 146/82  Pulse: 110  Temp: 97.9 F (36.6 C)  Resp: 16    Neurovascular intact Dorsiflexion/Plantar flexion intact Incision: dressing C/D/I No cellulitis present Compartment soft  LABS  Basename 02/19/11 0343 02/18/11 0437  HGB 11.8* 10.5*  HCT 36.7 32.5*  WBC 13.3* 11.9*  PLT 220 231     Basename 02/19/11 0343 02/18/11 0437  NA 140 135  K 4.7 4.9  BUN 9 11  CREATININE 0.58 0.66  GLUCOSE 135* 134*     Assessment/Plan: 2 Days Post-Op Procedure(s) (LRB): TOTAL HIP ARTHROPLASTY ANTERIOR APPROACH (Left)   Up with therapy Discharge home with home health   Anastasio Auerbach. Tyonna Talerico   PAC  02/19/2011, 7:44 AM

## 2011-02-19 NOTE — Progress Notes (Signed)
Physical Therapy Treatment Patient Details Name: Danielle [name] MRN: [id number] DOB: 06-25-1945 Today's Date: 02/19/2011  PT Assessment/Plan  PT - Assessment/Plan Comments on Treatment Session: pt feels ready to  D/C home today, progressing well PT Plan: Discharge plan remains appropriate;Frequency remains appropriate Follow Up Recommendations: Home health PT Equipment Recommended: None recommended by PT PT Goals  Acute Rehab PT Goals Pt will go Sit to Stand: with modified independence PT Goal: Sit to Stand - Progress: Met Pt will go Stand to Sit: with modified independence PT Goal: Stand to Sit - Progress: Met Pt will Ambulate: 51 - 150 feet;with rolling walker;with supervision;with modified independence PT Goal: Ambulate - Progress: Met Pt will Go Up / Down Stairs: 1-2 stairs;with min assist;with rolling walker PT Goal: Up/Down Stairs - Progress: Met Pt will Perform Home Exercise Program: with supervision, verbal cues required/provided PT Goal: Perform Home Exercise Program - Progress: Met  PT Treatment Precautions/Restrictions  Precautions Precautions:  (NONE) Restrictions Weight Bearing Restrictions: Yes LLE Weight Bearing: Weight bearing as tolerated Mobility (including Balance) Transfers Sit to Stand: 5: Supervision;6: Modified independent (Device/Increase time) Stand to Sit: 6: Modified independent (Device/Increase time) Ambulation/Gait Ambulation/Gait Assistance: 5: Supervision;6: Modified independent (Device/Increase time) Ambulation/Gait Assistance Details (indicate cue type and reason): initial cues to push RW Ambulation Distance (Feet): 170 Feet Assistive device: Rolling walker Gait Pattern: Step-through pattern;Step-to pattern Stairs: Yes Stair Management Technique: Backwards;With walker Number of Stairs: 1     Exercise  Total Joint Exercises Ankle Circles/Pumps: AROM;10 reps;Both Heel Slides: AAROM;Left;10 reps End of Session PT - End of  Session Equipment Utilized During Treatment: Gait belt Activity Tolerance: Patient tolerated treatment well Patient left: in chair;with call bell in reach;with family/visitor present General Behavior During Session: [facility] for tasks performed Cognition: [facility] for tasks performed  [facility] 02/19/2011, 9:53 AM

## 2011-02-19 NOTE — Progress Notes (Signed)
  CARE MANAGEMENT NOTE 02/19/2011  Patient:  Danielle [name], Danielle [name]   Account Number:  [id number]  Date Initiated:  02/19/2011  Documentation initiated by:  Colleen Can  Subjective/Objective Assessment:   dx Osteoarthritiis left hip; total hip replacemnt-anterior approach     Action/Plan:   Pt is planning to home in [place] where fiance and son will be caregivers. She already has RW, cane and commode seat. Home Health services were pre-arranged with Genevieve Norlander prior to admission, Choice offered' pt wants [place].   Anticipated DC Date:  02/19/2011   Anticipated DC Plan:  HOME W HOME HEALTH SERVICES  In-house referral  NA      DC Planning Services  CM consult      [facility] Choice  HOME HEALTH   Choice offered to / List presented to:  C-1 Patient   DME arranged  NA      DME agency  NA     HH arranged  HH-2 PT      [facility] agency  [facility]   Status of service:  Completed, signed off Medicare Important Message given?  NA - LOS <3 / Initial given by admissions (If response is "NO", the following Medicare IM given date fields will be blank) Date Medicare IM given:   Date Additional Medicare IM given:    Discharge Disposition:  HOME W HOME HEALTH SERVICES  Per UR Regulation:    Comments:  List of HH agencies placed in shadow chart. HH services will start 02/20/2011.

## 2011-02-19 NOTE — Evaluation (Addendum)
Occupational Therapy Evaluation Patient Details Name: Danielle [name] MRN: [id number] DOB: 27-Jun-1945 Today's Date: 02/19/2011  Problem List:  Patient Active Problem List  Diagnoses  . S/P left hip replacement    Past Medical History:  Past Medical History  Diagnosis Date  . Difficult intubation 02-13-11    with surgery -multiple times  . Glaucoma 02-13-11    bil. tx. eye drops daily  . Sleep apnea 02-13-11    Hx. sleep apnea-uses cpap since 10'12  . Heart murmur 02-13-11    was told-no issues with this  . Sinus drainage 02-13-11    uses Zyrtec as needed  . Hypothyroidism 02-13-11    tx. with Levothyroxine  . GERD (gastroesophageal reflux disease) 02-13-11    tx. Omeprazole  . Headache 02-13-11    past hx. migraines, none recent  . Arthritis 02-13-11    osteoarthritis, hips, knees,s/p Cervical fusion(DDD)  . Hypertension 02-13-11    tx. Atenolol   Past Surgical History:  Past Surgical History  Procedure Date  . Abdominal hysterectomy 02-13-11  . Joint replacement 02-13-11    RTHA-a yr ago in [place]  . Cervical fusion 02-13-11    '92- retained hardware  . Cholecystectomy 02-13-11    '98-lap. galbladder removal due to stones  . Tubal ligation 02-13-11  . Appendectomy 02-13-11    Lap. removal '92  . Tonsillectomy 02-13-11    child  . Gum surgery 02-13-11    "small mouth"    OT Assessment/Plan/Recommendation OT Assessment Clinical Impression Statement: Pt will benefit from skilled OT services to increase independence with ADL prior to discharge.  OT Recommendation/Assessment: Patient will need skilled OT in the acute care venue OT Problem List: Decreased strength;Decreased knowledge of use of DME or AE;Pain OT Therapy Diagnosis : Generalized weakness;Acute pain OT Plan OT Frequency: Min 2X/week OT Treatment/Interventions: Self-care/ADL training;Therapeutic activities;DME and/or AE instruction;Patient/family education OT Recommendation Follow Up Recommendations: No OT follow up Equipment  Recommended: Tub/shower bench Individuals Consulted Consulted and Agree with Results and Recommendations: Patient OT Goals Acute Rehab OT Goals OT Goal Formulation: With patient/family Time For Goal Achievement: 7 days ADL Goals Pt Will Perform Grooming: Standing at sink;with supervision ADL Goal: Grooming - Progress: Goal set today Pt Will Transfer to Toilet: with supervision;Ambulation;with DME;3-in-1 ADL Goal: Toilet Transfer - Progress: Goal set today Pt Will Perform Toileting - Clothing Manipulation: with supervision;Standing ADL Goal: Toileting - Clothing Manipulation - Progress: Goal set today Pt Will Perform Tub/Shower Transfer: supervision withTransfer tub bench ADL Goal: Tub/Shower Transfer - Progress: Goal set today  OT Evaluation Precautions/Restrictions  Precautions Precautions:  (NONE) Restrictions Weight Bearing Restrictions: No LLE Weight Bearing: Weight bearing as tolerated Prior Functioning Home Living Lives With:  ( fiance) Receives Help From: Other (Comment) (fiance) Type of Home: Apartment Home Layout: One level Home Access: Other (comment) (curb only) Bathroom Shower/Tub: Engineer, manufacturing systems: Standard Home Adaptive Equipment: Walker - rolling;Bedside commode/3-in-1;Straight cane Additional Comments: pt states she has a shower chair but it is stored in the back of a storage unit and is not accessible right now. Prior Function Level of Independence: Independent with transfers;Requires assistive device for independence;Independent with gait;Independent with basic ADLs (uses cane occasionally) ADL ADL Eating/Feeding: Simulated;Independent Where Assessed - Eating/Feeding: Chair Grooming: Set up;Wash/dry hands;Performed Where Assessed - Grooming: Sitting, chair Upper Body Bathing: Simulated;Chest;Right arm;Left arm;Abdomen;Set up Where Assessed - Upper Body Bathing: Sitting, bed;Unsupported Lower Body Bathing: Simulated;Minimal  assistance Where Assessed - Lower Body Bathing: Sit to stand from bed Upper Body  Dressing: Simulated;Set up Where Assessed - Upper Body Dressing: Sitting, bed;Unsupported Lower Body Dressing: Simulated;Moderate assistance Lower Body Dressing Details (indicate cue type and reason): has a reacher at home and states she knows how to use it to start pants over legs.  Where Assessed - Lower Body Dressing: Sit to stand from bed Toilet Transfer: Performed;Minimal assistance Toilet Transfer Details (indicate cue type and reason): min guard assist Toilet Transfer Method: Ambulating Toilet Transfer Equipment: Raised toilet seat with arms (or 3-in-1 over toilet);Other (comment) (RW) Toileting - Clothing Manipulation: Simulated;Minimal assistance Toileting - Clothing Manipulation Details (indicate cue type and reason): min guard assist Where Assessed - Toileting Clothing Manipulation: Standing Toileting - Hygiene: Performed;Independent Where Assessed - Toileting Hygiene: Sit on 3-in-1 or toilet Tub/Shower Transfer: Not assessed Tub/Shower Transfer Method: Not assessed Equipment Used: Rolling walker ADL Comments: Pt states she feels alittle weak but did well with activity. Her fiance came in for the end of eval. She would like a tubbech for discharge. Informed Genevieve Norlander who will discuss cost with her. Verbally reviewed and demonstrated technique for tub transfer with bench and pt verbalized  understanding. Pt has a 3in1 already. Fiance can assist with ADL as needed at discharge also. Vision/Perception    Cognition Cognition Arousal/Alertness: Awake/alert Overall Cognitive Status: Appears within functional limits for tasks assessed Orientation Level: Oriented X4 Sensation/Coordination Sensation Light Touch: Appears Intact Extremity Assessment RUE Assessment RUE Assessment: Within Functional Limits LUE Assessment LUE Assessment: Within Functional Limits Mobility  Bed Mobility Bed Mobility:  Yes Supine to Sit: 5: Supervision;HOB elevated (Comment degrees) Sitting - Scoot to Edge of Bed: 5: Supervision Transfers Sit to Stand: 5: Supervision;From bed Stand to Sit: 5: Supervision;To chair/3-in-1 Stand to Sit Details: min verbal cues for hand placement Exercises Total Joint Exercises Ankle Circles/Pumps: AROM;10 reps;Both Heel Slides: AAROM;Left;10 reps End of Session OT - End of Session Equipment Utilized During Treatment: Other (comment) (RW) Activity Tolerance: Patient tolerated treatment well Patient left: in chair;with call bell in reach;with family/visitor present General Behavior During Session: [facility] for tasks performed Cognition: [facility] for tasks performed   Lennox Laity [phone] 02/19/2011, 12:29 PM

## 2011-02-19 NOTE — Progress Notes (Signed)
Prescription for norco given to pt, placed in pt's hands.  D/c instructions given and explained to pt.  Pt stable and ready for d/c.  Pt d/c home with family at this time.

## 2011-02-21 DIAGNOSIS — I10 Essential (primary) hypertension: Secondary | ICD-10-CM | POA: Diagnosis not present

## 2011-02-21 DIAGNOSIS — F329 Major depressive disorder, single episode, unspecified: Secondary | ICD-10-CM | POA: Diagnosis not present

## 2011-02-21 DIAGNOSIS — Z471 Aftercare following joint replacement surgery: Secondary | ICD-10-CM | POA: Diagnosis not present

## 2011-02-21 DIAGNOSIS — M169 Osteoarthritis of hip, unspecified: Secondary | ICD-10-CM | POA: Diagnosis not present

## 2011-02-21 DIAGNOSIS — IMO0001 Reserved for inherently not codable concepts without codable children: Secondary | ICD-10-CM | POA: Diagnosis not present

## 2011-02-21 DIAGNOSIS — Z96649 Presence of unspecified artificial hip joint: Secondary | ICD-10-CM | POA: Diagnosis not present

## 2011-02-23 DIAGNOSIS — Z96649 Presence of unspecified artificial hip joint: Secondary | ICD-10-CM | POA: Diagnosis not present

## 2011-02-23 DIAGNOSIS — F329 Major depressive disorder, single episode, unspecified: Secondary | ICD-10-CM | POA: Diagnosis not present

## 2011-02-23 DIAGNOSIS — I10 Essential (primary) hypertension: Secondary | ICD-10-CM | POA: Diagnosis not present

## 2011-02-23 DIAGNOSIS — Z471 Aftercare following joint replacement surgery: Secondary | ICD-10-CM | POA: Diagnosis not present

## 2011-02-23 DIAGNOSIS — IMO0001 Reserved for inherently not codable concepts without codable children: Secondary | ICD-10-CM | POA: Diagnosis not present

## 2011-02-23 NOTE — Discharge Summary (Signed)
Physician Discharge Summary  Patient ID: Danielle [name] MRN: [id number] DOB/AGE: [id number] 65 y.o.  Admit date: 02/17/2011 Discharge date: 02/19/2011  Procedures:  Procedure(s) (LRB): TOTAL HIP ARTHROPLASTY ANTERIOR APPROACH (Left)  Attending Physician: Dr. Durene Romans  Admission Diagnoses: Left hip OA and pain    Discharge Diagnoses:  Principal Problem:  *S/P left hip replacement HTN  Anxiety / Depression  Sleep Apnea  Glaucoma  Heart Murmur  GERD  Thyroid Disease  HPI: Pt is a 66 y.o. female complaining of left hip pain for 5 years. Pain had continually increased since the beginning, significantly increasing over the last couple of weeks. X-rays in the clinic show end-stage arthritic changes of the left hip. Pt has tried various conservative treatments which have failed to alleviate their symptoms including intraarticular injections and NSAIDs. Various options are discussed with the patient. Risks, benefits and expectations were discussed with the patient. Patient understand the risks, benefits and expectations and wishes to proceed with surgery.   PCP: No primary provider on file.   Discharged Condition: good  Hospital Course:  Patient underwent the above stated procedure on 02/17/2011. Patient tolerated the procedure well and brought to the recovery room in good condition and subsequently to the floor.  POD #1 BP: 96/61 ; Pulse: 87 ; Temp: 97.7 F (36.5 C) ; Resp: 15  Pt's foley was removed, as well as the hemovac drain removed. IV was changed to a saline lock. Patient reports pain as mild. Pain is well controlled. No events. Neurovascular intact, dorsiflexion/plantar flexion intact, incision: dressing C/D/I, no cellulitis present and compartment soft.  LABS  Basename  02/18/11 0437   HGB  10.5*   HCT  32.5*    POD #2  BP: 146/82 ; Pulse: 110 ; Temp: 97.9 F (36.6 C) ; Resp: 16  Patient reports pain as mild. Some redness, controlled with Benadryl. Otherwise no  events. Discharged home with HHPT. Neurovascular intact, dorsiflexion/plantar flexion intact, incision: dressing C/D/I, no cellulitis present and compartment soft.  LABS  Basename  02/19/11 0343   HGB  11.8*   HCT  36.7    Discharge Exam: Extremities: Homans sign is negative, no sign of DVT, no edema, redness or tenderness in the calves or thighs and no ulcers, gangrene or trophic changes  Disposition: Home-Health Care Svc   with follow up in 2 weeks  Follow-up Information    Follow up with OLIN,Laira Penninger D in 2 weeks.   Contact information:   [facility] [address], [address] [place] [address] [phone]          Discharge Orders    Future Orders Please Complete By Expires   Diet - low sodium heart healthy      Call MD / Call 911      Comments:   If you experience chest pain or shortness of breath, CALL 911 and be transported to the hospital emergency room.  If you develope a fever above 101 F, pus (white drainage) or increased drainage or redness at the wound, or calf pain, call your surgeon's office.   Discharge instructions      Comments:   Maintain surgical dressing for 8 days, then replace with gauze and tape. Keep the area dry and clean until follow up. Follow up in 2 weeks at [facility]. Call with any questions or concerns.     Constipation Prevention      Comments:   Drink plenty of fluids.  Prune juice may be helpful.  You may use a stool softener, such as Colace (over the counter) 100 mg twice a day.  Use MiraLax (over the counter) for constipation as needed.   Increase activity slowly as tolerated      Weight Bearing as taught in Physical Therapy      Comments:   Use a walker or crutches as instructed.   Driving restrictions      Comments:   No driving for 4 weeks   Change dressing      Comments:   Maintain surgical dressing for 8 days, then replace with 4x4 guaze and tape. Keep the area dry and clean.    TED hose      Comments:   Use stockings (TED hose) for 2 weeks on both leg(s).  You may remove them at night for sleeping.      Discharge Medication List as of 02/19/2011 12:55 PM    START taking these medications   Details  aspirin EC 325 MG tablet Take 1 tablet (325 mg total) by mouth 2 (two) times daily. X 4 weeks, Starting 02/19/2011, Until Sun 03/01/11, No Print    diphenhydrAMINE (BENADRYL) 25 mg capsule Take 1 capsule (25 mg total) by mouth every 6 (six) hours as needed for itching, allergies or sleep., Starting 02/19/2011, Until Sun 03/01/11, No Print    docusate sodium 100 MG CAPS Take 100 mg by mouth 2 (two) times daily., Starting 02/19/2011, Until Sun 03/01/11, No Print    ferrous sulfate 325 (65 FE) MG tablet Take 1 tablet (325 mg total) by mouth 3 (three) times daily after meals., Starting 02/19/2011, Until Fri 02/19/12, No Print    HYDROcodone-acetaminophen (NORCO) 7.5-325 MG per tablet Take 1-2 tablets by mouth every 4 (four) hours., Starting 02/19/2011, Until Sun 03/01/11, Print    methocarbamol (ROBAXIN) 500 MG tablet Take 1 tablet (500 mg total) by mouth every 6 (six) hours as needed (muscle spasms)., Starting 02/19/2011, Until Sun 03/01/11, No Print    polyethylene glycol (MIRALAX / GLYCOLAX) packet Take 17 g by mouth 2 (two) times daily., Starting 02/19/2011, Until Sun 02/22/11, No Print      CONTINUE these medications which have NOT CHANGED   Details  atenolol (TENORMIN) 25 MG tablet Take 25 mg by mouth every morning.  , Until Discontinued, Historical Med    DULoxetine (CYMBALTA) 30 MG capsule Take 30 mg by mouth every morning.  , Until Discontinued, Historical Med    levothyroxine (SYNTHROID, LEVOTHROID) 112 MCG tablet Take 112 mcg by mouth every morning.  , Until Discontinued, Historical Med    omeprazole (PRILOSEC) 20 MG capsule Take 20 mg by mouth daily.  , Until Discontinued, Historical Med    simvastatin (ZOCOR) 40 MG tablet Take 40 mg by mouth every evening.  , Until  Discontinued, Historical Med    travoprost, benzalkonium, (TRAVATAN) 0.004 % ophthalmic solution Place 1 drop into both eyes at bedtime.  , Until Discontinued, Historical Med    calcium-vitamin D (OSCAL WITH D) 500-200 MG-UNIT per tablet Take 1 tablet by mouth 2 (two) times daily.  , Until Discontinued, Historical Med    cetirizine (ZYRTEC) 10 MG tablet Take 10 mg by mouth daily as needed. For allergies , Until Discontinued, Historical Med    hydrochlorothiazide (MICROZIDE) 12.5 MG capsule Take 12.5 mg by mouth every morning.  , Until Discontinued, Historical Med    magnesium oxide (MAG-OX) 400 MG tablet Take 400 mg by mouth 2 (two) times daily.  , Until Discontinued, Historical Med  STOP taking these medications     HYDROcodone-acetaminophen (VICODIN) 5-500 MG per tablet Comments:  Reason for Stopping:          Signed: Anastasio Auerbach. Ryosuke Ericksen   PAC  02/23/2011, 9:41 AM

## 2011-02-26 DIAGNOSIS — I10 Essential (primary) hypertension: Secondary | ICD-10-CM | POA: Diagnosis not present

## 2011-02-26 DIAGNOSIS — Z471 Aftercare following joint replacement surgery: Secondary | ICD-10-CM | POA: Diagnosis not present

## 2011-02-26 DIAGNOSIS — Z96649 Presence of unspecified artificial hip joint: Secondary | ICD-10-CM | POA: Diagnosis not present

## 2011-02-26 DIAGNOSIS — F329 Major depressive disorder, single episode, unspecified: Secondary | ICD-10-CM | POA: Diagnosis not present

## 2011-02-26 DIAGNOSIS — IMO0001 Reserved for inherently not codable concepts without codable children: Secondary | ICD-10-CM | POA: Diagnosis not present

## 2011-02-27 DIAGNOSIS — IMO0001 Reserved for inherently not codable concepts without codable children: Secondary | ICD-10-CM | POA: Diagnosis not present

## 2011-02-27 DIAGNOSIS — Z96649 Presence of unspecified artificial hip joint: Secondary | ICD-10-CM | POA: Diagnosis not present

## 2011-02-27 DIAGNOSIS — F329 Major depressive disorder, single episode, unspecified: Secondary | ICD-10-CM | POA: Diagnosis not present

## 2011-02-27 DIAGNOSIS — Z471 Aftercare following joint replacement surgery: Secondary | ICD-10-CM | POA: Diagnosis not present

## 2011-02-27 DIAGNOSIS — I10 Essential (primary) hypertension: Secondary | ICD-10-CM | POA: Diagnosis not present

## 2011-03-02 DIAGNOSIS — Z471 Aftercare following joint replacement surgery: Secondary | ICD-10-CM | POA: Diagnosis not present

## 2011-03-02 DIAGNOSIS — I10 Essential (primary) hypertension: Secondary | ICD-10-CM | POA: Diagnosis not present

## 2011-03-02 DIAGNOSIS — IMO0001 Reserved for inherently not codable concepts without codable children: Secondary | ICD-10-CM | POA: Diagnosis not present

## 2011-03-02 DIAGNOSIS — F329 Major depressive disorder, single episode, unspecified: Secondary | ICD-10-CM | POA: Diagnosis not present

## 2011-03-02 DIAGNOSIS — Z96649 Presence of unspecified artificial hip joint: Secondary | ICD-10-CM | POA: Diagnosis not present

## 2011-03-04 DIAGNOSIS — Z96649 Presence of unspecified artificial hip joint: Secondary | ICD-10-CM | POA: Diagnosis not present

## 2011-03-04 DIAGNOSIS — I10 Essential (primary) hypertension: Secondary | ICD-10-CM | POA: Diagnosis not present

## 2011-03-04 DIAGNOSIS — Z471 Aftercare following joint replacement surgery: Secondary | ICD-10-CM | POA: Diagnosis not present

## 2011-03-04 DIAGNOSIS — F329 Major depressive disorder, single episode, unspecified: Secondary | ICD-10-CM | POA: Diagnosis not present

## 2011-03-04 DIAGNOSIS — IMO0001 Reserved for inherently not codable concepts without codable children: Secondary | ICD-10-CM | POA: Diagnosis not present

## 2011-03-05 DIAGNOSIS — IMO0001 Reserved for inherently not codable concepts without codable children: Secondary | ICD-10-CM | POA: Diagnosis not present

## 2011-03-05 DIAGNOSIS — F329 Major depressive disorder, single episode, unspecified: Secondary | ICD-10-CM | POA: Diagnosis not present

## 2011-03-05 DIAGNOSIS — Z471 Aftercare following joint replacement surgery: Secondary | ICD-10-CM | POA: Diagnosis not present

## 2011-03-05 DIAGNOSIS — I10 Essential (primary) hypertension: Secondary | ICD-10-CM | POA: Diagnosis not present

## 2011-03-05 DIAGNOSIS — Z96649 Presence of unspecified artificial hip joint: Secondary | ICD-10-CM | POA: Diagnosis not present

## 2011-03-09 DIAGNOSIS — Z471 Aftercare following joint replacement surgery: Secondary | ICD-10-CM | POA: Diagnosis not present

## 2011-03-09 DIAGNOSIS — I10 Essential (primary) hypertension: Secondary | ICD-10-CM | POA: Diagnosis not present

## 2011-03-09 DIAGNOSIS — F329 Major depressive disorder, single episode, unspecified: Secondary | ICD-10-CM | POA: Diagnosis not present

## 2011-03-09 DIAGNOSIS — IMO0001 Reserved for inherently not codable concepts without codable children: Secondary | ICD-10-CM | POA: Diagnosis not present

## 2011-03-09 DIAGNOSIS — Z96649 Presence of unspecified artificial hip joint: Secondary | ICD-10-CM | POA: Diagnosis not present

## 2011-03-10 DIAGNOSIS — Z96649 Presence of unspecified artificial hip joint: Secondary | ICD-10-CM | POA: Diagnosis not present

## 2011-03-10 DIAGNOSIS — F329 Major depressive disorder, single episode, unspecified: Secondary | ICD-10-CM | POA: Diagnosis not present

## 2011-03-10 DIAGNOSIS — I10 Essential (primary) hypertension: Secondary | ICD-10-CM | POA: Diagnosis not present

## 2011-03-10 DIAGNOSIS — Z471 Aftercare following joint replacement surgery: Secondary | ICD-10-CM | POA: Diagnosis not present

## 2011-03-10 DIAGNOSIS — IMO0001 Reserved for inherently not codable concepts without codable children: Secondary | ICD-10-CM | POA: Diagnosis not present

## 2011-03-12 ENCOUNTER — Encounter (HOSPITAL_COMMUNITY): Payer: Self-pay | Admitting: Orthopedic Surgery

## 2011-03-12 DIAGNOSIS — F329 Major depressive disorder, single episode, unspecified: Secondary | ICD-10-CM | POA: Diagnosis not present

## 2011-03-12 DIAGNOSIS — Z471 Aftercare following joint replacement surgery: Secondary | ICD-10-CM | POA: Diagnosis not present

## 2011-03-12 DIAGNOSIS — Z96649 Presence of unspecified artificial hip joint: Secondary | ICD-10-CM | POA: Diagnosis not present

## 2011-03-12 DIAGNOSIS — IMO0001 Reserved for inherently not codable concepts without codable children: Secondary | ICD-10-CM | POA: Diagnosis not present

## 2011-03-12 DIAGNOSIS — I10 Essential (primary) hypertension: Secondary | ICD-10-CM | POA: Diagnosis not present

## 2011-03-16 DIAGNOSIS — Z96649 Presence of unspecified artificial hip joint: Secondary | ICD-10-CM | POA: Diagnosis not present

## 2011-03-16 DIAGNOSIS — Z471 Aftercare following joint replacement surgery: Secondary | ICD-10-CM | POA: Diagnosis not present

## 2011-03-16 DIAGNOSIS — IMO0001 Reserved for inherently not codable concepts without codable children: Secondary | ICD-10-CM | POA: Diagnosis not present

## 2011-03-16 DIAGNOSIS — F329 Major depressive disorder, single episode, unspecified: Secondary | ICD-10-CM | POA: Diagnosis not present

## 2011-03-16 DIAGNOSIS — I10 Essential (primary) hypertension: Secondary | ICD-10-CM | POA: Diagnosis not present

## 2011-03-18 DIAGNOSIS — F329 Major depressive disorder, single episode, unspecified: Secondary | ICD-10-CM | POA: Diagnosis not present

## 2011-03-18 DIAGNOSIS — IMO0001 Reserved for inherently not codable concepts without codable children: Secondary | ICD-10-CM | POA: Diagnosis not present

## 2011-03-18 DIAGNOSIS — I10 Essential (primary) hypertension: Secondary | ICD-10-CM | POA: Diagnosis not present

## 2011-03-18 DIAGNOSIS — Z471 Aftercare following joint replacement surgery: Secondary | ICD-10-CM | POA: Diagnosis not present

## 2011-03-18 DIAGNOSIS — Z96649 Presence of unspecified artificial hip joint: Secondary | ICD-10-CM | POA: Diagnosis not present

## 2011-03-19 DIAGNOSIS — IMO0001 Reserved for inherently not codable concepts without codable children: Secondary | ICD-10-CM | POA: Diagnosis not present

## 2011-03-19 DIAGNOSIS — I10 Essential (primary) hypertension: Secondary | ICD-10-CM | POA: Diagnosis not present

## 2011-03-19 DIAGNOSIS — Z471 Aftercare following joint replacement surgery: Secondary | ICD-10-CM | POA: Diagnosis not present

## 2011-03-19 DIAGNOSIS — Z96649 Presence of unspecified artificial hip joint: Secondary | ICD-10-CM | POA: Diagnosis not present

## 2011-03-19 DIAGNOSIS — F329 Major depressive disorder, single episode, unspecified: Secondary | ICD-10-CM | POA: Diagnosis not present

## 2011-04-16 DIAGNOSIS — M169 Osteoarthritis of hip, unspecified: Secondary | ICD-10-CM | POA: Diagnosis not present

## 2011-05-14 DIAGNOSIS — G4733 Obstructive sleep apnea (adult) (pediatric): Secondary | ICD-10-CM | POA: Diagnosis not present

## 2011-05-27 DIAGNOSIS — M169 Osteoarthritis of hip, unspecified: Secondary | ICD-10-CM | POA: Diagnosis not present

## 2011-06-01 DIAGNOSIS — M2550 Pain in unspecified joint: Secondary | ICD-10-CM | POA: Diagnosis not present

## 2011-06-01 DIAGNOSIS — R609 Edema, unspecified: Secondary | ICD-10-CM | POA: Diagnosis not present

## 2011-06-01 DIAGNOSIS — R5383 Other fatigue: Secondary | ICD-10-CM | POA: Diagnosis not present

## 2011-06-01 DIAGNOSIS — R5381 Other malaise: Secondary | ICD-10-CM | POA: Diagnosis not present

## 2011-06-01 DIAGNOSIS — M1990 Unspecified osteoarthritis, unspecified site: Secondary | ICD-10-CM | POA: Diagnosis not present

## 2011-06-22 ENCOUNTER — Other Ambulatory Visit: Payer: Self-pay | Admitting: Rheumatology

## 2011-06-22 DIAGNOSIS — M1990 Unspecified osteoarthritis, unspecified site: Secondary | ICD-10-CM | POA: Diagnosis not present

## 2011-06-22 DIAGNOSIS — M064 Inflammatory polyarthropathy: Secondary | ICD-10-CM | POA: Diagnosis not present

## 2011-06-22 DIAGNOSIS — M2550 Pain in unspecified joint: Secondary | ICD-10-CM | POA: Diagnosis not present

## 2011-06-26 ENCOUNTER — Ambulatory Visit
Admission: RE | Admit: 2011-06-26 | Discharge: 2011-06-26 | Disposition: A | Discharge status: Home / Self Care | Payer: No Typology Code available for payment source | Source: Ambulatory Visit | Attending: Rheumatology | Admitting: Rheumatology

## 2011-06-26 DIAGNOSIS — M25579 Pain in unspecified ankle and joints of unspecified foot: Secondary | ICD-10-CM | POA: Diagnosis not present

## 2011-06-26 DIAGNOSIS — M19079 Primary osteoarthritis, unspecified ankle and foot: Secondary | ICD-10-CM | POA: Diagnosis not present

## 2011-06-26 DIAGNOSIS — M064 Inflammatory polyarthropathy: Secondary | ICD-10-CM

## 2011-06-26 DIAGNOSIS — M7730 Calcaneal spur, unspecified foot: Secondary | ICD-10-CM | POA: Diagnosis not present

## 2011-06-26 MED ORDER — GADOBENATE DIMEGLUMINE 529 MG/ML IV SOLN
20.0000 mL | Freq: Once | INTRAVENOUS | Status: AC | PRN
  Administered 2011-06-26: 20 mL via INTRAVENOUS

## 2011-06-29 ENCOUNTER — Other Ambulatory Visit: Payer: Self-pay

## 2011-06-29 ENCOUNTER — Encounter (HOSPITAL_COMMUNITY): Payer: Self-pay

## 2011-06-29 ENCOUNTER — Emergency Department (HOSPITAL_COMMUNITY)
Admission: EM | Admit: 2011-06-29 | Discharge: 2011-06-29 | Disposition: A | Discharge status: Home / Self Care | Payer: Medicare Other | Attending: Emergency Medicine | Admitting: Emergency Medicine

## 2011-06-29 DIAGNOSIS — M7730 Calcaneal spur, unspecified foot: Secondary | ICD-10-CM | POA: Insufficient documentation

## 2011-06-29 DIAGNOSIS — I10 Essential (primary) hypertension: Secondary | ICD-10-CM | POA: Diagnosis not present

## 2011-06-29 DIAGNOSIS — M159 Polyosteoarthritis, unspecified: Secondary | ICD-10-CM | POA: Insufficient documentation

## 2011-06-29 DIAGNOSIS — G4730 Sleep apnea, unspecified: Secondary | ICD-10-CM | POA: Diagnosis not present

## 2011-06-29 DIAGNOSIS — E039 Hypothyroidism, unspecified: Secondary | ICD-10-CM | POA: Insufficient documentation

## 2011-06-29 DIAGNOSIS — R1013 Epigastric pain: Secondary | ICD-10-CM | POA: Diagnosis not present

## 2011-06-29 DIAGNOSIS — K219 Gastro-esophageal reflux disease without esophagitis: Secondary | ICD-10-CM | POA: Insufficient documentation

## 2011-06-29 DIAGNOSIS — R109 Unspecified abdominal pain: Secondary | ICD-10-CM

## 2011-06-29 LAB — COMPREHENSIVE METABOLIC PANEL
ALT: 14 U/L (ref 0–35)
BUN: 12 mg/dL (ref 6–23)
CO2: 26 mEq/L (ref 19–32)
Calcium: 10 mg/dL (ref 8.4–10.5)
Creatinine, Ser: 0.65 mg/dL (ref 0.50–1.10)
GFR calc Af Amer: >90 mL/min (ref >90)
GFR calc non Af Amer: >90 mL/min (ref >90)
Glucose, Bld: 92 mg/dL (ref 70–99)
Sodium: 139 mEq/L (ref 135–145)
Total Protein: 7.2 g/dL (ref 6.0–8.3)

## 2011-06-29 LAB — POCT I-STAT, CHEM 8
BUN: 12 mg/dL (ref 6–23)
Calcium, Ion: 1.29 mmol/L (ref 1.12–1.32)
Chloride: 103 mEq/L (ref 96–112)
Glucose, Bld: 99 mg/dL (ref 70–99)
TCO2: 25 mmol/L (ref 0–100)

## 2011-06-29 LAB — DIFFERENTIAL
Basophils Absolute: 0.1 10*3/uL (ref 0.0–0.1)
Eosinophils Relative: 4 % (ref 0–5)
Lymphocytes Relative: 30 % (ref 12–46)
Monocytes Absolute: 0.8 10*3/uL (ref 0.1–1.0)
Monocytes Relative: 9 % (ref 3–12)

## 2011-06-29 LAB — CBC
HCT: 43.7 % (ref 36.0–46.0)
HCT: 43.1 % (ref 36.0–46.0)
Hemoglobin: 15 g/dL (ref 12.0–15.0)
MCH: 29.5 pg (ref 26.0–34.0)
MCHC: 34.3 g/dL (ref 30.0–36.0)
MCV: 86.2 fL (ref 78.0–100.0)
MCV: 86 fL (ref 78.0–100.0)
RBC: 5.01 MIL/uL (ref 3.87–5.11)
RDW: 13.7 % (ref 11.5–15.5)
WBC: 8.6 10*3/uL (ref 4.0–10.5)
WBC: 8.8 10*3/uL (ref 4.0–10.5)

## 2011-06-29 MED ORDER — GI COCKTAIL ~~LOC~~
30.0000 mL | Freq: Once | ORAL | Status: AC
  Administered 2011-06-29: 30 mL via ORAL
  Filled 2011-06-29: qty 30

## 2011-06-29 NOTE — Discharge Instructions (Signed)
Take omeprazole 20 mg daily. Use Maalox 2 tablespoons after meals and at bedtime. Call Dr. Clent Ridges tomorrow to schedule an office appointment for this week. You may need to see a gastroenterologist if not improving. Return if your condition worsens for any reason

## 2011-06-29 NOTE — ED Notes (Signed)
Report received from [place], [place].  Assumed care of this patient at this time.

## 2011-06-29 NOTE — ED Notes (Signed)
Pt presented to the Er with complaint of epigastric pain onset yesterday that went away then came back with nausea. Pt has a hx of acid reflux. Pt stated that she took TUMS but did not help. Pt denies any chest discomfort.

## 2011-06-29 NOTE — ED Provider Notes (Signed)
History     CSN: [id number]  Arrival date & time 06/29/11  1012   First MD Initiated Contact with Patient 06/29/11 1340      Chief Complaint  Patient presents with  . Abdominal Pain    (Consider location/radiation/quality/duration/timing/severity/associated sxs/prior treatment) HPI Complains of Epigastric pain, nonradiating onset gradually last night. Pain feels similar to pain when she had before she got her gallbladder out. Treated herself with TUMS, without relief relief no nausea no vomiting last bowel movement yesterday, normal no chest pain symptoms not worse with exertion she has not eaten since pain started however she is slightly hungry now. Last bowel movement yesterday normal no fever no other associated symptoms. Symptoms not made worse by exertion presently symptoms mild as compared to earlier today  Past Medical History  Diagnosis Date  . Difficult intubation 02-13-11    with surgery -multiple times  . Glaucoma 02-13-11    bil. tx. eye drops daily  . Sleep apnea 02-13-11    Hx. sleep apnea-uses cpap since 10'12  . Heart murmur 02-13-11    was told-no issues with this  . Sinus drainage 02-13-11    uses Zyrtec as needed  . Hypothyroidism 02-13-11    tx. with Levothyroxine  . GERD (gastroesophageal reflux disease) 02-13-11    tx. Omeprazole  . Headache 02-13-11    past hx. migraines, none recent  . Arthritis 02-13-11    osteoarthritis, hips, knees,s/p Cervical fusion(DDD)  . Hypertension 02-13-11    tx. Atenolol    Past Surgical History  Procedure Date  . Abdominal hysterectomy 02-13-11  . Joint replacement 02-13-11    RTHA-a yr ago in [place]  . Cervical fusion 02-13-11    '92- retained hardware  . Cholecystectomy 02-13-11    '98-lap. galbladder removal due to stones  . Tubal ligation 02-13-11  . Appendectomy 02-13-11    Lap. removal '92  . Tonsillectomy 02-13-11    child  . Gum surgery 02-13-11    "small mouth"  . Total hip arthroplasty 02/17/2011    Procedure: TOTAL HIP  ARTHROPLASTY ANTERIOR APPROACH;  Surgeon: [name], MD;  Location: [facility];  Service: Orthopedics;  Laterality: Left;    No family history on file.  History  Substance Use Topics  . Smoking status: Never Smoker   . Smokeless tobacco: Not on file  . Alcohol Use: Yes     rare    OB History    Grav Para Term Preterm Abortions TAB SAB Ect Mult Living                  Review of Systems  Gastrointestinal: Positive for abdominal pain.    Allergies  Ceftin; Codeine; and Compazine  Home Medications   Current Outpatient Rx  Name Route Sig Dispense Refill  . ATENOLOL 25 MG PO TABS Oral Take 25 mg by mouth every morning.      Marland Kitchen CALCIUM CARBONATE-VITAMIN D 500-200 MG-UNIT PO TABS Oral Take 1 tablet by mouth 2 (two) times daily.      Marland Kitchen CETIRIZINE HCL 10 MG PO TABS Oral Take 10 mg by mouth daily as needed. For allergies     . DULOXETINE HCL 30 MG PO CPEP Oral Take 30 mg by mouth every morning.      Marland Kitchen FERROUS SULFATE 325 (65 FE) MG PO TABS Oral Take 1 tablet (325 mg total) by mouth 3 (three) times daily after meals.    Marland Kitchen HYDROCHLOROTHIAZIDE 12.5 MG PO CAPS Oral Take 12.5  mg by mouth every morning.      Marland Kitchen LEVOTHYROXINE SODIUM 112 MCG PO TABS Oral Take 112 mcg by mouth every morning.      Marland Kitchen MAGNESIUM OXIDE 400 MG PO TABS Oral Take 400 mg by mouth 2 (two) times daily.      Marland Kitchen OMEPRAZOLE 20 MG PO CPDR Oral Take 20 mg by mouth daily.      Marland Kitchen SIMVASTATIN 40 MG PO TABS Oral Take 40 mg by mouth every evening.      . TRAVOPROST 0.004 % OP SOLN Both Eyes Place 1 drop into both eyes at bedtime.        BP 148/78  Pulse 70  Temp 98.5 F (36.9 C) (Oral)  Resp 16  Ht 5\' 3"  (1.6 m)  Wt 210 lb (95.255 kg)  BMI 37.20 kg/m2  SpO2 95%  Physical Exam  Nursing note and vitals reviewed. Constitutional: She appears well-developed and well-nourished.  HENT:  Head: Normocephalic and atraumatic.  Eyes: Conjunctivae are normal. Pupils are equal, round, and reactive to light.  Neck: Neck supple. No  tracheal deviation present. No thyromegaly present.  Cardiovascular: Normal rate and regular rhythm.   No murmur heard. Pulmonary/Chest: Effort normal and breath sounds normal.  Abdominal: Soft. Bowel sounds are normal. She exhibits no distension and no mass. There is tenderness. There is no rebound and no guarding.       Tenderness at epigastrium, obese  Musculoskeletal: Normal range of motion. She exhibits no edema and no tenderness.  Neurological: She is alert. Coordination normal.  Skin: Skin is warm and dry. No rash noted.  Psychiatric: She has a normal mood and affect.   Date: 06/29/2011  Rate: 75  Rhythm: normal sinus rhythm  QRS Axis: normal  Intervals: normal  ST/T Wave abnormalities: normal  Conduction Disutrbances: none  Narrative Interpretation: unremarkable  Unchanged from every 2/1 /2013    ED Course  Procedures (including critical care time) 3 PM pain improved after treatment with GI cocktail 4:10 PM patient feels improved and ready to home  Labs Reviewed  POCT I-STAT, CHEM 8 - Abnormal; Notable for the following:    Hemoglobin 15.6 (*)     All other components within normal limits  CBC  DIFFERENTIAL   No results found.   No diagnosis found.  Results for orders placed during the hospital encounter of 06/29/11  CBC      Component Value Range   WBC 8.6  4.0 - 10.5 K/uL   RBC 5.07  3.87 - 5.11 MIL/uL   Hemoglobin 15.0  12.0 - 15.0 g/dL   HCT 40.9  81.1 - 91.4 %   MCV 86.2  78.0 - 100.0 fL   MCH 29.6  26.0 - 34.0 pg   MCHC 34.3  30.0 - 36.0 g/dL   RDW 78.2  95.6 - 21.3 %   Platelets 284  150 - 400 K/uL  DIFFERENTIAL      Component Value Range   Neutrophils Relative 57  43 - 77 %   Neutro Abs 4.9  1.7 - 7.7 K/uL   Lymphocytes Relative 30  12 - 46 %   Lymphs Abs 2.5  0.7 - 4.0 K/uL   Monocytes Relative 9  3 - 12 %   Monocytes Absolute 0.8  0.1 - 1.0 K/uL   Eosinophils Relative 4  0 - 5 %   Eosinophils Absolute 0.4  0.0 - 0.7 K/uL   Basophils  Relative 1  0 - 1 %  Basophils Absolute 0.1  0.0 - 0.1 K/uL  POCT I-STAT, CHEM 8      Component Value Range   Sodium 140  135 - 145 mEq/L   Potassium 4.5  3.5 - 5.1 mEq/L   Chloride 103  96 - 112 mEq/L   BUN 12  6 - 23 mg/dL   Creatinine, Ser 6.21  0.50 - 1.10 mg/dL   Glucose, Bld 99  70 - 99 mg/dL   Calcium, Ion 3.08  6.57 - 1.32 mmol/L   TCO2 25  0 - 100 mmol/L   Hemoglobin 15.6 (*) 12.0 - 15.0 g/dL   HCT 84.6  96.2 - 95.2 %  COMPREHENSIVE METABOLIC PANEL      Component Value Range   Sodium 139  135 - 145 mEq/L   Potassium 4.2  3.5 - 5.1 mEq/L   Chloride 101  96 - 112 mEq/L   CO2 26  19 - 32 mEq/L   Glucose, Bld 92  70 - 99 mg/dL   BUN 12  6 - 23 mg/dL   Creatinine, Ser 8.41  0.50 - 1.10 mg/dL   Calcium 32.4  8.4 - 40.1 mg/dL   Total Protein 7.2  6.0 - 8.3 g/dL   Albumin 4.2  3.5 - 5.2 g/dL   AST 16  0 - 37 U/L   ALT 14  0 - 35 U/L   Alkaline Phosphatase 91  39 - 117 U/L   Total Bilirubin 0.4  0.3 - 1.2 mg/dL   GFR calc non Af Amer >90  >90 mL/min   GFR calc Af Amer >90  >90 mL/min  LIPASE, BLOOD      Component Value Range   Lipase 16  11 - 59 U/L  CBC      Component Value Range   WBC 8.8  4.0 - 10.5 K/uL   RBC 5.01  3.87 - 5.11 MIL/uL   Hemoglobin 14.8  12.0 - 15.0 g/dL   HCT 02.7  25.3 - 66.4 %   MCV 86.0  78.0 - 100.0 fL   MCH 29.5  26.0 - 34.0 pg   MCHC 34.3  30.0 - 36.0 g/dL   RDW 40.3  47.4 - 25.9 %   Platelets 260  150 - 400 K/uL  POCT I-STAT TROPONIN I      Component Value Range   Troponin i, poc 0.00  0.00 - 0.08 ng/mL   Comment 3            Mr Ankle Left W Wo Contrast  06/26/2011  *RADIOLOGY REPORT*  Clinical Data: Left ankle and foot pain.  Swelling.  Query arthropathy.  MRI OF THE LEFT ANKLE WITHOUT AND WITH CONTRAST  Technique:  Multiplanar, multisequence MR imaging was performed both before and after administration of intravenous contrast.  Contrast: 20mL MULTIHANCE GADOBENATE DIMEGLUMINE 529 MG/ML IV SOLN  Comparison: 06/01/2011  Findings: Plantar  calcaneal spurring noted.  The Achilles tendon appears normal.  Subtalar joints appear unremarkable.  A type 2 accessory navicular is present without internal edema. Degenerative arthropathy with low-level associated edema noted at the articulations of the middle and lateral cuneiforms with their respective metatarsals.  Inferior tibiofibular ligaments appear intact.  The anterior talofibular ligament is slightly indistinct but overall the lateral ligamentous complex is thought to be intact.  Deltoid ligament and spring ligament appear unremarkable.  A flattened contour of the peroneus brevis posterior to the lateral malleolus suggests a short segment longitudinal tear.  The remaining flexor tendons of the  ankle appear intact.  A tiny focus of subcortical edema along the medial talar dome appears degenerative on image 8 of series 8.  The plafond appears normal.  Subcutaneous edema is present along the dorsum of the proximal foot.  A small degenerative appearing subcortical lesion is present along the base of the first metatarsal on image 4 of series 10.  No obvious erosion characteristic of gout noted.  Subcutaneous edema is present overlying the malleoli.  IMPRESSION:  1.  Plantar calcaneal spur noted without overt plantar fasciitis. 2.  Suspected longitudinal tear of a short segment of the peroneus brevis tendon below the lateral malleolus. 3.  Degenerative lesions and degenerative appearing subcortical edema noted along the medial portion of the Lisfranc joint, without erosive findings specific for gout. 4.  Subcutaneous edema dorsal to the ankle and superficial to the malleoli.  Original Report Authenticated By: Dellia Cloud, M.D.     MDM  Strongly doubt cardiac etiology of symptoms i.e. highly atypical symptoms, normal EKG normal troponin after approximately one day of symptoms Symptoms of suggestive of GI etiology Differential diagnosis includes gastritis, peptic ulcer disease, esophagitis,  nonspecific Plan Maalox 2 tablespoons after meals and at bedtime Patient is to use omeprazole 20 mg daily she uses it now only as an as needed basis Followup with Dr. Clent Ridges Diagnosis abdominal pain        Doug Sou, MD 06/29/11 1617

## 2011-06-29 NOTE — ED Notes (Signed)
Patient stable. Patient verbalized understanding of discharge instructions.  Follow-up discussed with the patient at this time.

## 2011-06-29 NOTE — ED Notes (Signed)
Pt very tense and jumpy unable to get IV after 3 trys

## 2011-06-29 NOTE — ED Notes (Signed)
Mid upper  Epigastric pain since testerday nausea no vomiting no radiation

## 2011-07-01 ENCOUNTER — Other Ambulatory Visit: Payer: Self-pay | Admitting: Gastroenterology

## 2011-07-01 DIAGNOSIS — K219 Gastro-esophageal reflux disease without esophagitis: Secondary | ICD-10-CM | POA: Diagnosis not present

## 2011-07-01 DIAGNOSIS — A048 Other specified bacterial intestinal infections: Secondary | ICD-10-CM | POA: Diagnosis not present

## 2011-07-01 DIAGNOSIS — R1013 Epigastric pain: Secondary | ICD-10-CM

## 2011-07-01 DIAGNOSIS — R109 Unspecified abdominal pain: Secondary | ICD-10-CM | POA: Diagnosis not present

## 2011-07-06 ENCOUNTER — Ambulatory Visit
Admission: RE | Admit: 2011-07-06 | Discharge: 2011-07-06 | Disposition: A | Discharge status: Home / Self Care | Payer: Medicare Other | Source: Ambulatory Visit | Attending: Gastroenterology | Admitting: Gastroenterology

## 2011-07-06 DIAGNOSIS — K219 Gastro-esophageal reflux disease without esophagitis: Secondary | ICD-10-CM | POA: Diagnosis not present

## 2011-07-06 DIAGNOSIS — R1013 Epigastric pain: Secondary | ICD-10-CM

## 2011-07-06 DIAGNOSIS — R110 Nausea: Secondary | ICD-10-CM | POA: Diagnosis not present

## 2011-07-06 DIAGNOSIS — K7689 Other specified diseases of liver: Secondary | ICD-10-CM | POA: Diagnosis not present

## 2011-08-14 DIAGNOSIS — E039 Hypothyroidism, unspecified: Secondary | ICD-10-CM | POA: Diagnosis not present

## 2011-08-14 DIAGNOSIS — G5600 Carpal tunnel syndrome, unspecified upper limb: Secondary | ICD-10-CM | POA: Diagnosis not present

## 2011-08-14 DIAGNOSIS — R609 Edema, unspecified: Secondary | ICD-10-CM | POA: Diagnosis not present

## 2011-08-14 DIAGNOSIS — K219 Gastro-esophageal reflux disease without esophagitis: Secondary | ICD-10-CM | POA: Diagnosis not present

## 2011-08-14 DIAGNOSIS — E669 Obesity, unspecified: Secondary | ICD-10-CM | POA: Diagnosis not present

## 2011-08-14 DIAGNOSIS — E785 Hyperlipidemia, unspecified: Secondary | ICD-10-CM | POA: Diagnosis not present

## 2011-08-14 DIAGNOSIS — F329 Major depressive disorder, single episode, unspecified: Secondary | ICD-10-CM | POA: Diagnosis not present

## 2011-08-14 DIAGNOSIS — M1990 Unspecified osteoarthritis, unspecified site: Secondary | ICD-10-CM | POA: Diagnosis not present

## 2011-08-14 DIAGNOSIS — M2550 Pain in unspecified joint: Secondary | ICD-10-CM | POA: Diagnosis not present

## 2011-08-14 DIAGNOSIS — I10 Essential (primary) hypertension: Secondary | ICD-10-CM | POA: Diagnosis not present

## 2011-11-13 ENCOUNTER — Other Ambulatory Visit: Payer: Self-pay | Admitting: Family Medicine

## 2011-11-13 ENCOUNTER — Other Ambulatory Visit: Payer: Self-pay | Admitting: Internal Medicine

## 2011-11-13 DIAGNOSIS — Z23 Encounter for immunization: Secondary | ICD-10-CM | POA: Diagnosis not present

## 2011-11-13 DIAGNOSIS — E669 Obesity, unspecified: Secondary | ICD-10-CM | POA: Diagnosis not present

## 2011-11-13 DIAGNOSIS — Z1231 Encounter for screening mammogram for malignant neoplasm of breast: Secondary | ICD-10-CM

## 2011-11-13 DIAGNOSIS — M949 Disorder of cartilage, unspecified: Secondary | ICD-10-CM | POA: Diagnosis not present

## 2011-11-13 DIAGNOSIS — I10 Essential (primary) hypertension: Secondary | ICD-10-CM | POA: Diagnosis not present

## 2011-11-13 DIAGNOSIS — M899 Disorder of bone, unspecified: Secondary | ICD-10-CM

## 2011-11-13 DIAGNOSIS — E785 Hyperlipidemia, unspecified: Secondary | ICD-10-CM | POA: Diagnosis not present

## 2011-11-16 DIAGNOSIS — M1990 Unspecified osteoarthritis, unspecified site: Secondary | ICD-10-CM | POA: Diagnosis not present

## 2011-11-16 DIAGNOSIS — M2550 Pain in unspecified joint: Secondary | ICD-10-CM | POA: Diagnosis not present

## 2011-11-16 DIAGNOSIS — Z79899 Other long term (current) drug therapy: Secondary | ICD-10-CM | POA: Diagnosis not present

## 2011-11-16 DIAGNOSIS — G5600 Carpal tunnel syndrome, unspecified upper limb: Secondary | ICD-10-CM | POA: Diagnosis not present

## 2011-12-23 ENCOUNTER — Ambulatory Visit
Admission: RE | Admit: 2011-12-23 | Discharge: 2011-12-23 | Disposition: A | Discharge status: Home / Self Care | Payer: Medicare Other | Source: Ambulatory Visit | Attending: Internal Medicine | Admitting: Internal Medicine

## 2011-12-23 DIAGNOSIS — M949 Disorder of cartilage, unspecified: Secondary | ICD-10-CM

## 2011-12-23 DIAGNOSIS — Z1231 Encounter for screening mammogram for malignant neoplasm of breast: Secondary | ICD-10-CM | POA: Diagnosis not present

## 2011-12-23 DIAGNOSIS — M899 Disorder of bone, unspecified: Secondary | ICD-10-CM | POA: Diagnosis not present

## 2012-08-27 IMAGING — RF DG UGI W/ HIGH DENSITY W/KUB
19 of 24 series · 19 of 24 positions shown · non-contrast
Comparison: Today's ultrasound..

CLINICAL DATA: Epigastric pain.

UPPER GI SERIES WITH KUB
TECHNIQUE: Routine upper GI series was performed with thin and
high density
Fluoroscopy Time: 4.3 minutes

[Series 1: run · 1 of 1 slices shown (1 of 19)]
[im 1/1]
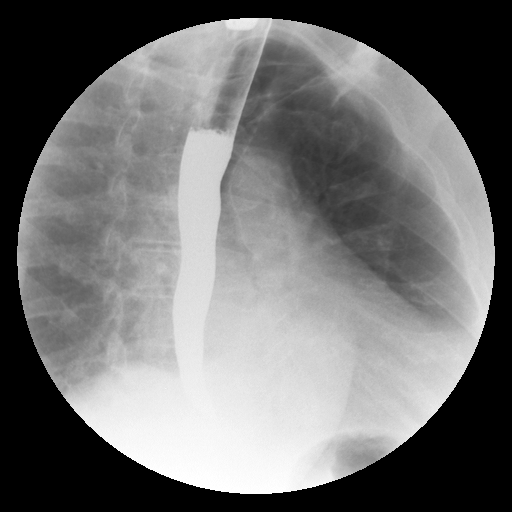

[Series 2: run · 1 of 1 slices shown (2 of 19)]
[im 1/1]
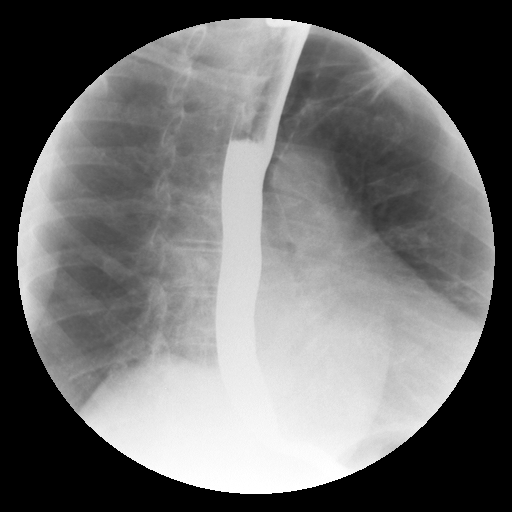

[Series 4: run · 1 of 1 slices shown (3 of 19)]
[im 1/1]
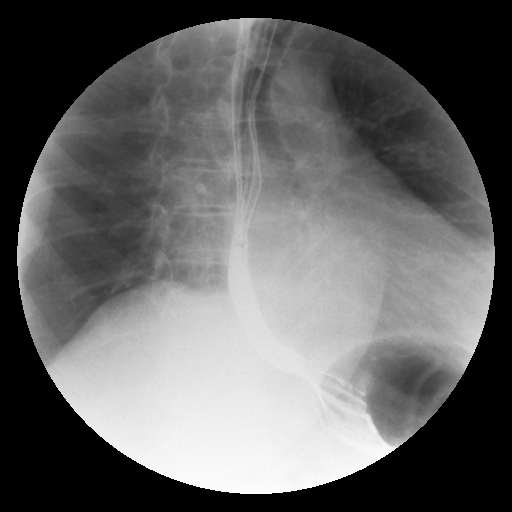

[Series 5: run · 1 of 1 slices shown (4 of 19)]
[im 1/1]
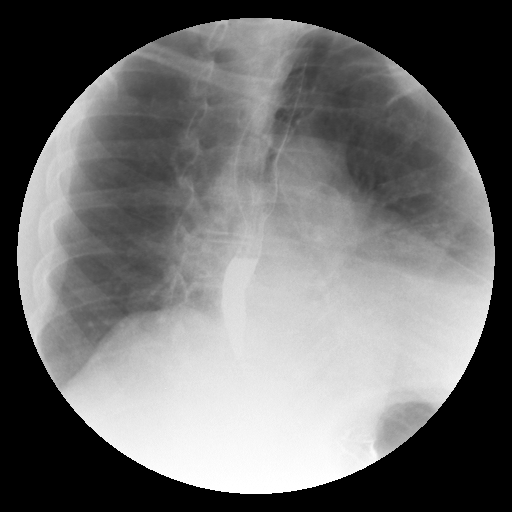

[Series 6: run · 1 of 1 slices shown (5 of 19)]
[im 1/1]
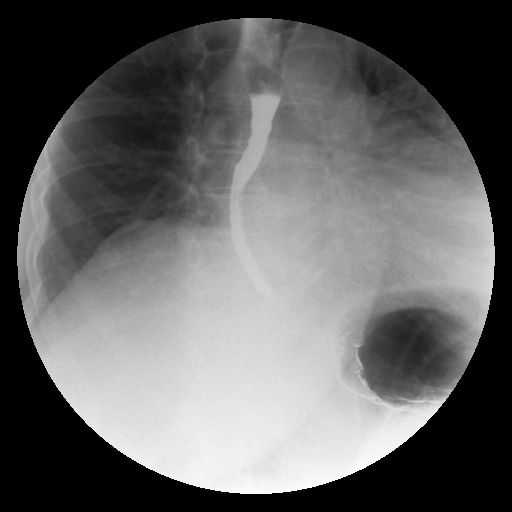

[Series 7: run · 1 of 1 slices shown (6 of 19)]
[im 1/1]
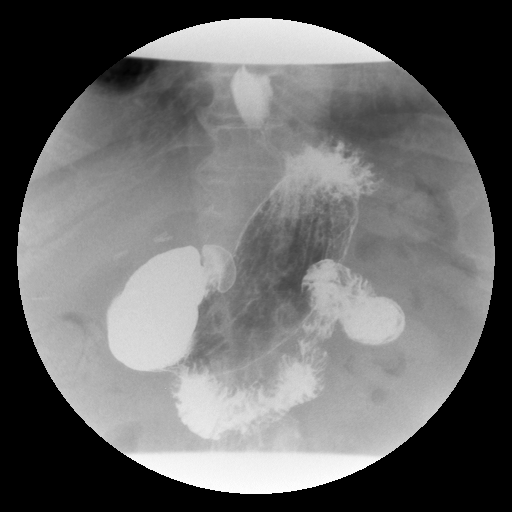

[Series 9: run · 1 of 1 slices shown (7 of 19)]
[im 1/1]
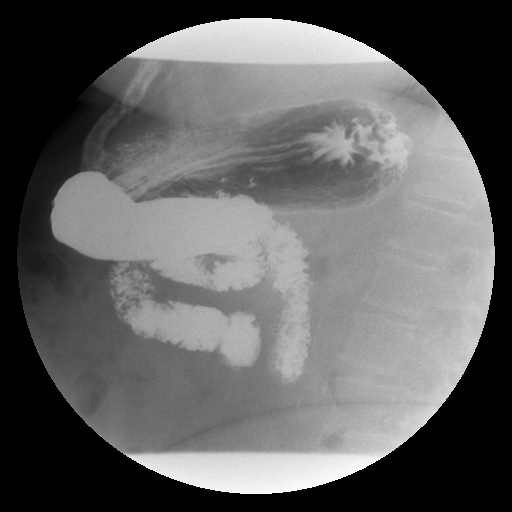

[Series 10: run · 1 of 1 slices shown (8 of 19)]
[im 1/1]
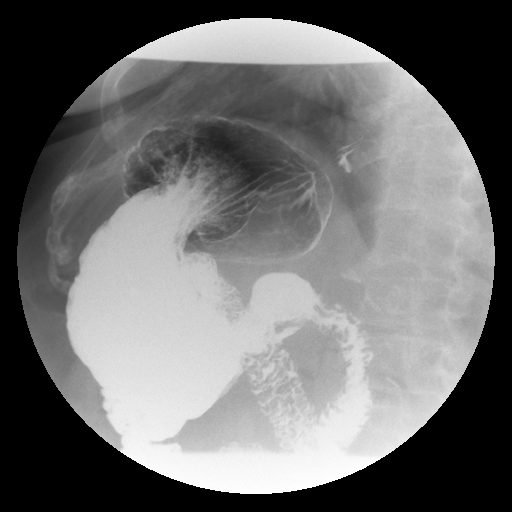

[Series 11: run · 1 of 1 slices shown (9 of 19)]
[im 1/1]
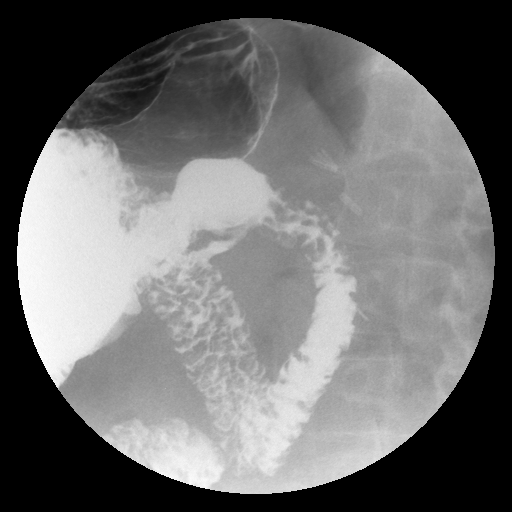

[Series 13: run · 1 of 1 slices shown (10 of 19)]
[im 1/1]
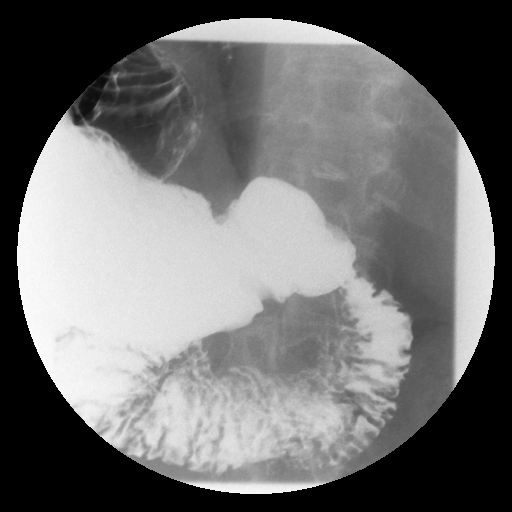

[Series 14: run · 1 of 1 slices shown (11 of 19)]
[im 1/1]
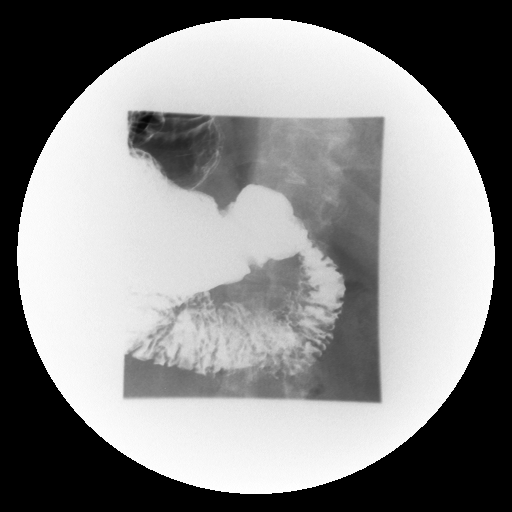

[Series 15: run · 1 of 1 slices shown (12 of 19)]
[im 1/1]
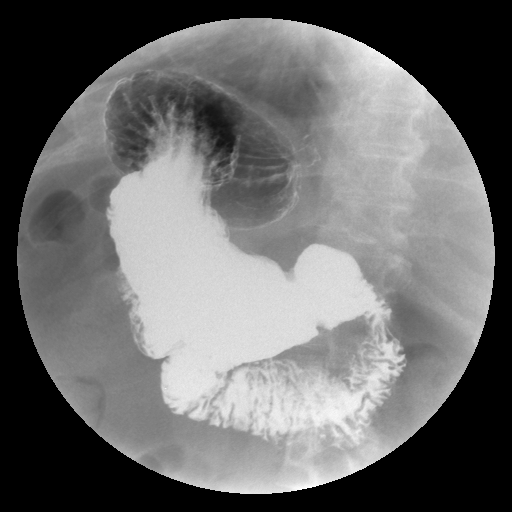

[Series 16: run · 1 of 1 slices shown (13 of 19)]
[im 1/1]
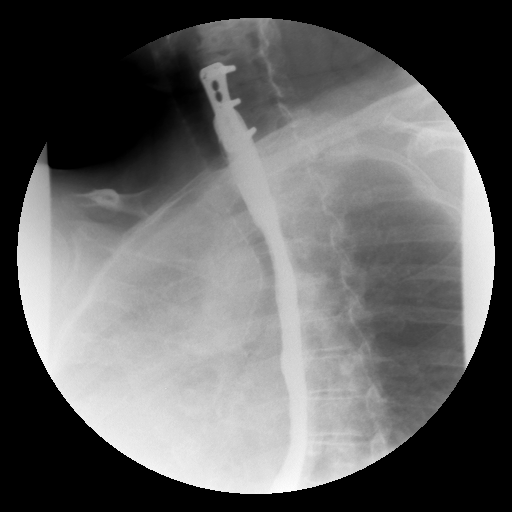

[Series 18: run · 1 of 1 slices shown (14 of 19)]
[im 1/1]
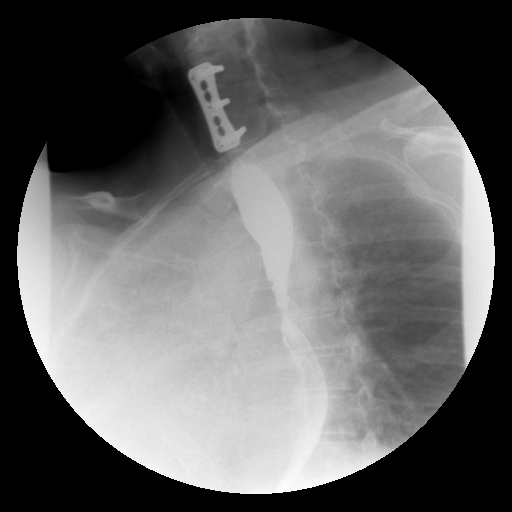

[Series 19: run · 1 of 1 slices shown (15 of 19)]
[im 1/1]
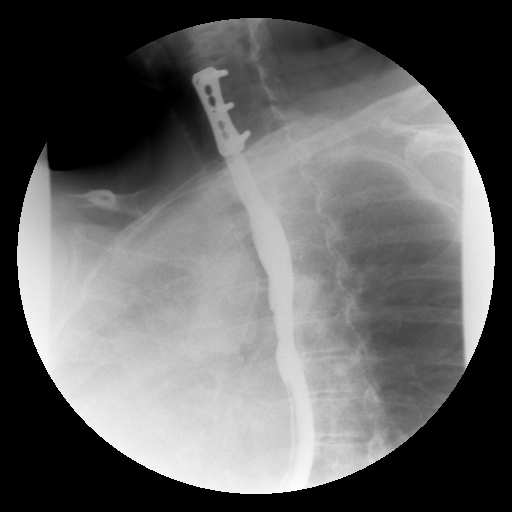

[Series 20: run · 1 of 1 slices shown (16 of 19)]
[im 1/1]
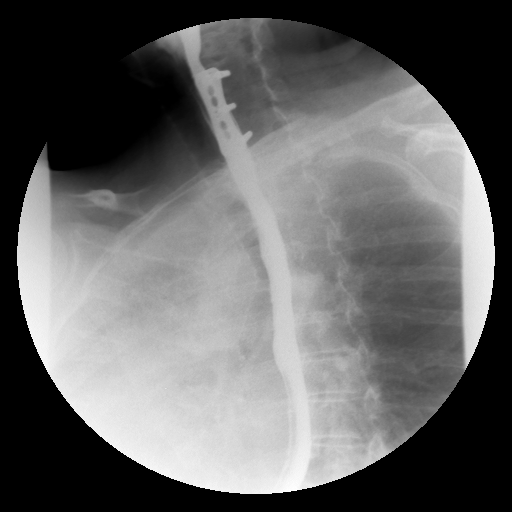

[Series 21: run · 1 of 1 slices shown (17 of 19)]
[im 1/1]
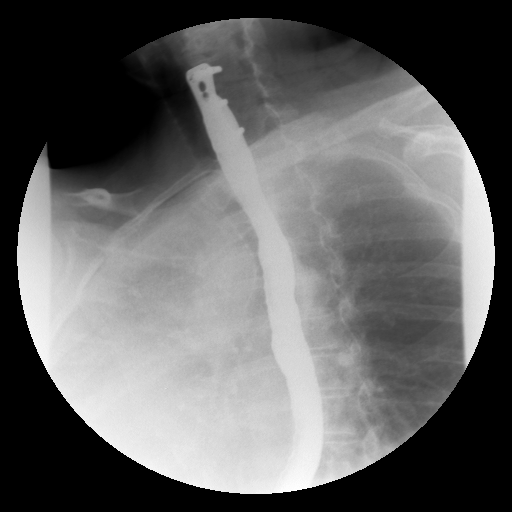

[Series 23: run · 1 of 1 slices shown (18 of 19)]
[im 1/1]
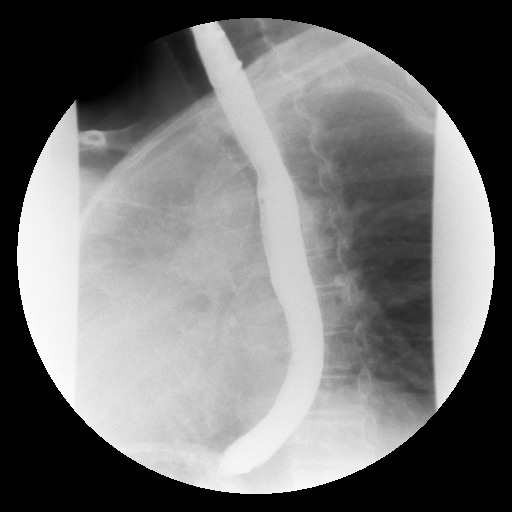

[Series 24: run · 1 of 1 slices shown (19 of 19)]
[im 1/1]
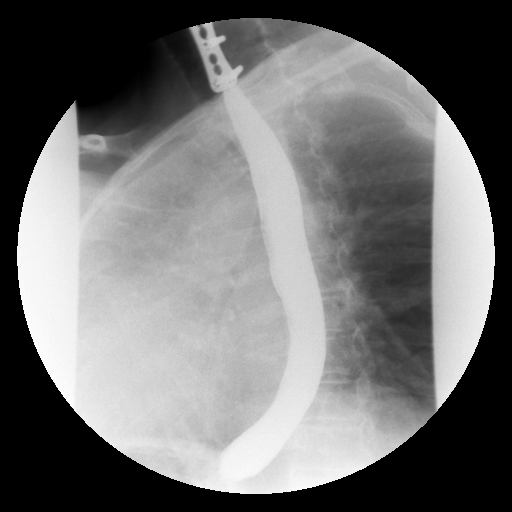

[19 of 24 positions shown; findings below may reference images not displayed]

FINDINGS: Pre procedure scout film demonstrates bilateral hip
arthroplasties.  Non obstructive bowel gas pattern.
Cholecystectomy clips.  Increased density projecting over the left
iliac wing is likely within the overlying bowel.  Absent on
02/17/2011.

Double contrast evaluation of the esophagus demonstrates no mucosal
abnormality.

Double contrast evaluation of the stomach demonstrates no evidence
of ulcer or mass.  The duodenal bulb and C-loop are within normal
limits.

Evaluation of primary peristalsis demonstrates incomplete primary
peristaltic wave with contrast stasis throughout the mid and lower
thoracic esophagus.

Full column evaluation of the esophagus demonstrates surgical
changes in the lower cervical spine.  No focal narrowing or
stricture.

There is minimal gastroesophageal reflux on the "water siphon
test."
IMPRESSION: 1. Moderate esophageal dysmotility, likely presbyesophagus.
2.  Minimal gastroesophageal reflux.

## 2012-09-28 ENCOUNTER — Other Ambulatory Visit: Payer: Self-pay | Admitting: Internal Medicine

## 2012-09-28 ENCOUNTER — Other Ambulatory Visit: Payer: Self-pay | Admitting: *Deleted

## 2012-09-28 DIAGNOSIS — F329 Major depressive disorder, single episode, unspecified: Secondary | ICD-10-CM | POA: Diagnosis not present

## 2012-09-28 DIAGNOSIS — N6320 Unspecified lump in the left breast, unspecified quadrant: Secondary | ICD-10-CM

## 2012-09-28 DIAGNOSIS — L723 Sebaceous cyst: Secondary | ICD-10-CM | POA: Diagnosis not present

## 2012-09-28 DIAGNOSIS — E039 Hypothyroidism, unspecified: Secondary | ICD-10-CM | POA: Diagnosis not present

## 2012-09-28 DIAGNOSIS — I10 Essential (primary) hypertension: Secondary | ICD-10-CM | POA: Diagnosis not present

## 2012-09-28 DIAGNOSIS — K219 Gastro-esophageal reflux disease without esophagitis: Secondary | ICD-10-CM | POA: Diagnosis not present

## 2012-09-28 DIAGNOSIS — E785 Hyperlipidemia, unspecified: Secondary | ICD-10-CM | POA: Diagnosis not present

## 2012-09-28 DIAGNOSIS — R609 Edema, unspecified: Secondary | ICD-10-CM | POA: Diagnosis not present

## 2012-09-28 DIAGNOSIS — N63 Unspecified lump in unspecified breast: Secondary | ICD-10-CM | POA: Diagnosis not present

## 2012-10-12 ENCOUNTER — Other Ambulatory Visit: Payer: Medicare Other

## 2012-10-18 ENCOUNTER — Ambulatory Visit
Admission: RE | Admit: 2012-10-18 | Discharge: 2012-10-18 | Disposition: A | Discharge status: Home / Self Care | Payer: Medicare Other | Source: Ambulatory Visit | Attending: Internal Medicine | Admitting: Internal Medicine

## 2012-10-18 DIAGNOSIS — N6320 Unspecified lump in the left breast, unspecified quadrant: Secondary | ICD-10-CM

## 2012-10-18 DIAGNOSIS — N6009 Solitary cyst of unspecified breast: Secondary | ICD-10-CM | POA: Diagnosis not present

## 2012-11-04 DIAGNOSIS — Z23 Encounter for immunization: Secondary | ICD-10-CM | POA: Diagnosis not present

## 2013-01-11 ENCOUNTER — Other Ambulatory Visit: Payer: Self-pay

## 2013-01-11 DIAGNOSIS — Z1231 Encounter for screening mammogram for malignant neoplasm of breast: Secondary | ICD-10-CM

## 2013-01-24 DIAGNOSIS — Z23 Encounter for immunization: Secondary | ICD-10-CM | POA: Diagnosis not present

## 2013-01-24 DIAGNOSIS — E039 Hypothyroidism, unspecified: Secondary | ICD-10-CM | POA: Diagnosis not present

## 2013-01-24 DIAGNOSIS — Z0000 Encounter for general adult medical examination without abnormal findings: Secondary | ICD-10-CM | POA: Diagnosis not present

## 2013-01-24 DIAGNOSIS — I10 Essential (primary) hypertension: Secondary | ICD-10-CM | POA: Diagnosis not present

## 2013-01-24 DIAGNOSIS — K219 Gastro-esophageal reflux disease without esophagitis: Secondary | ICD-10-CM | POA: Diagnosis not present

## 2013-01-24 DIAGNOSIS — F329 Major depressive disorder, single episode, unspecified: Secondary | ICD-10-CM | POA: Diagnosis not present

## 2013-02-07 ENCOUNTER — Ambulatory Visit
Admission: RE | Admit: 2013-02-07 | Discharge: 2013-02-07 | Disposition: A | Discharge status: Home / Self Care | Payer: Medicare Other | Source: Ambulatory Visit

## 2013-02-07 DIAGNOSIS — Z1231 Encounter for screening mammogram for malignant neoplasm of breast: Secondary | ICD-10-CM | POA: Diagnosis not present

## 2013-02-09 ENCOUNTER — Other Ambulatory Visit: Payer: Self-pay | Admitting: Internal Medicine

## 2013-02-09 DIAGNOSIS — R928 Other abnormal and inconclusive findings on diagnostic imaging of breast: Secondary | ICD-10-CM

## 2013-02-15 ENCOUNTER — Ambulatory Visit
Admission: RE | Admit: 2013-02-15 | Discharge: 2013-02-15 | Disposition: A | Discharge status: Home / Self Care | Payer: Medicare Other | Source: Ambulatory Visit | Attending: Internal Medicine | Admitting: Internal Medicine

## 2013-02-15 ENCOUNTER — Other Ambulatory Visit: Payer: Self-pay | Admitting: Internal Medicine

## 2013-02-15 DIAGNOSIS — R928 Other abnormal and inconclusive findings on diagnostic imaging of breast: Secondary | ICD-10-CM

## 2013-02-23 ENCOUNTER — Other Ambulatory Visit: Payer: Self-pay | Admitting: Internal Medicine

## 2013-02-23 ENCOUNTER — Ambulatory Visit
Admission: RE | Admit: 2013-02-23 | Discharge: 2013-02-23 | Disposition: A | Discharge status: Home / Self Care | Payer: Medicare Other | Source: Ambulatory Visit | Attending: Internal Medicine | Admitting: Internal Medicine

## 2013-02-23 DIAGNOSIS — M25579 Pain in unspecified ankle and joints of unspecified foot: Secondary | ICD-10-CM | POA: Diagnosis not present

## 2013-02-23 DIAGNOSIS — M79609 Pain in unspecified limb: Secondary | ICD-10-CM

## 2013-03-20 DIAGNOSIS — N390 Urinary tract infection, site not specified: Secondary | ICD-10-CM | POA: Diagnosis not present

## 2013-04-21 ENCOUNTER — Emergency Department (HOSPITAL_COMMUNITY): Payer: Medicare Other

## 2013-04-21 ENCOUNTER — Encounter (HOSPITAL_COMMUNITY): Payer: Self-pay | Admitting: Emergency Medicine

## 2013-04-21 ENCOUNTER — Emergency Department (HOSPITAL_COMMUNITY)
Admission: EM | Admit: 2013-04-21 | Discharge: 2013-04-21 | Disposition: A | Discharge status: Home / Self Care | Payer: Medicare Other | Attending: Emergency Medicine | Admitting: Emergency Medicine

## 2013-04-21 DIAGNOSIS — S8263XA Displaced fracture of lateral malleolus of unspecified fibula, initial encounter for closed fracture: Secondary | ICD-10-CM | POA: Insufficient documentation

## 2013-04-21 DIAGNOSIS — M1710 Unilateral primary osteoarthritis, unspecified knee: Secondary | ICD-10-CM | POA: Insufficient documentation

## 2013-04-21 DIAGNOSIS — Z7982 Long term (current) use of aspirin: Secondary | ICD-10-CM | POA: Insufficient documentation

## 2013-04-21 DIAGNOSIS — S92309A Fracture of unspecified metatarsal bone(s), unspecified foot, initial encounter for closed fracture: Secondary | ICD-10-CM | POA: Insufficient documentation

## 2013-04-21 DIAGNOSIS — G43909 Migraine, unspecified, not intractable, without status migrainosus: Secondary | ICD-10-CM | POA: Diagnosis not present

## 2013-04-21 DIAGNOSIS — K219 Gastro-esophageal reflux disease without esophagitis: Secondary | ICD-10-CM | POA: Diagnosis not present

## 2013-04-21 DIAGNOSIS — I10 Essential (primary) hypertension: Secondary | ICD-10-CM | POA: Insufficient documentation

## 2013-04-21 DIAGNOSIS — H409 Unspecified glaucoma: Secondary | ICD-10-CM | POA: Insufficient documentation

## 2013-04-21 DIAGNOSIS — IMO0002 Reserved for concepts with insufficient information to code with codable children: Secondary | ICD-10-CM | POA: Insufficient documentation

## 2013-04-21 DIAGNOSIS — Z79899 Other long term (current) drug therapy: Secondary | ICD-10-CM | POA: Insufficient documentation

## 2013-04-21 DIAGNOSIS — E039 Hypothyroidism, unspecified: Secondary | ICD-10-CM | POA: Diagnosis not present

## 2013-04-21 DIAGNOSIS — Y9301 Activity, walking, marching and hiking: Secondary | ICD-10-CM | POA: Insufficient documentation

## 2013-04-21 DIAGNOSIS — X500XXA Overexertion from strenuous movement or load, initial encounter: Secondary | ICD-10-CM | POA: Insufficient documentation

## 2013-04-21 DIAGNOSIS — G4730 Sleep apnea, unspecified: Secondary | ICD-10-CM | POA: Diagnosis not present

## 2013-04-21 DIAGNOSIS — M169 Osteoarthritis of hip, unspecified: Secondary | ICD-10-CM | POA: Insufficient documentation

## 2013-04-21 DIAGNOSIS — W108XXA Fall (on) (from) other stairs and steps, initial encounter: Secondary | ICD-10-CM | POA: Insufficient documentation

## 2013-04-21 DIAGNOSIS — M1610 Unilateral primary osteoarthritis, unspecified hip: Secondary | ICD-10-CM | POA: Insufficient documentation

## 2013-04-21 DIAGNOSIS — R011 Cardiac murmur, unspecified: Secondary | ICD-10-CM | POA: Diagnosis not present

## 2013-04-21 DIAGNOSIS — W1809XA Striking against other object with subsequent fall, initial encounter: Secondary | ICD-10-CM | POA: Insufficient documentation

## 2013-04-21 DIAGNOSIS — Y9289 Other specified places as the place of occurrence of the external cause: Secondary | ICD-10-CM | POA: Insufficient documentation

## 2013-04-21 MED ORDER — HYDROCODONE-ACETAMINOPHEN 5-325 MG PO TABS
1.0000 | ORAL_TABLET | ORAL | Status: DC | PRN
Start: 2013-04-21 — End: 2015-05-16

## 2013-04-21 MED ORDER — HYDROCODONE-ACETAMINOPHEN 5-325 MG PO TABS
2.0000 | ORAL_TABLET | Freq: Once | ORAL | Status: AC
  Administered 2013-04-21: 2 via ORAL
  Filled 2013-04-21: qty 2

## 2013-04-21 NOTE — Discharge Instructions (Signed)
Take motrin for pain.   For severe pain, take vicodin as prescribed.   You cannot drive until see orthopedic doctor.   Do not take off splint. Don't get it wet.   Use crutches.   See orthopedic doctor next week.   Return to ER if you have severe pain, toes turning blue.

## 2013-04-21 NOTE — ED Provider Notes (Signed)
CSN: [id number]     Arrival date & time 04/21/13  1552 History   First MD Initiated Contact with Patient 04/21/13 1631     Chief Complaint  Patient presents with  . Fall  . Ankle Pain     (Consider location/radiation/quality/duration/timing/severity/associated sxs/prior Treatment) The history is provided by the patient.  Danielle [name] is a 68 y.o. female hx of sleep apnea, GERD here with fall. She twisted her right ankle when going down the steps and fell down two steps. Also fell backwards and hit the back of her head. Unable to walk afterwards. Denies chest pain, abdominal pain, back pain. Mild headache but no vomiting.     Past Medical History  Diagnosis Date  . Difficult intubation 02-13-11    with surgery -multiple times  . Glaucoma 02-13-11    bil. tx. eye drops daily  . Sleep apnea 02-13-11    Hx. sleep apnea-uses cpap since 10'12  . Heart murmur 02-13-11    was told-no issues with this  . Sinus drainage 02-13-11    uses Zyrtec as needed  . Hypothyroidism 02-13-11    tx. with Levothyroxine  . GERD (gastroesophageal reflux disease) 02-13-11    tx. Omeprazole  . Headache(784.0) 02-13-11    past hx. migraines, none recent  . Arthritis 02-13-11    osteoarthritis, hips, knees,s/p Cervical fusion(DDD)  . Hypertension 02-13-11    tx. Atenolol   Past Surgical History  Procedure Laterality Date  . Abdominal hysterectomy  02-13-11  . Joint replacement  02-13-11    RTHA-a yr ago in [place]  . Cervical fusion  02-13-11    '92- retained hardware  . Cholecystectomy  02-13-11    '98-lap. galbladder removal due to stones  . Tubal ligation  02-13-11  . Appendectomy  02-13-11    Lap. removal '92  . Tonsillectomy  02-13-11    child  . Gum surgery  02-13-11    "small mouth"  . Total hip arthroplasty  02/17/2011    Procedure: TOTAL HIP ARTHROPLASTY ANTERIOR APPROACH;  Surgeon: Mauri Pole, MD;  Location: [facility];  Service: Orthopedics;  Laterality: Left;   No family history on file. History  Substance  Use Topics  . Smoking status: Never Smoker   . Smokeless tobacco: Not on file  . Alcohol Use: Yes     Comment: rare   OB History   Grav Para Term Preterm Abortions TAB SAB Ect Mult Living                 Review of Systems  Musculoskeletal:       R ankle pain   All other systems reviewed and are negative.     Allergies  Ceftin; Codeine; and Compazine  Home Medications   Current Outpatient Rx  Name  Route  Sig  Dispense  Refill  . aspirin EC 81 MG tablet   Oral   Take 81 mg by mouth every evening.         Marland Kitchen atenolol (TENORMIN) 25 MG tablet   Oral   Take 25 mg by mouth every morning.           . calcium-vitamin D (OSCAL WITH D) 500-200 MG-UNIT per tablet   Oral   Take 1 tablet by mouth 2 (two) times daily.           . cetirizine (ZYRTEC) 10 MG tablet   Oral   Take 10 mg by mouth daily as needed. For allergies          .  DULoxetine (CYMBALTA) 60 MG capsule   Oral   Take 60 mg by mouth daily.         Marland Kitchen levothyroxine (SYNTHROID, LEVOTHROID) 112 MCG tablet   Oral   Take 112 mcg by mouth every morning.           Marland Kitchen omeprazole (PRILOSEC) 20 MG capsule   Oral   Take 20 mg by mouth daily as needed. For heart burn         . rosuvastatin (CRESTOR) 10 MG tablet   Oral   Take 10 mg by mouth daily.         . travoprost, benzalkonium, (TRAVATAN) 0.004 % ophthalmic solution   Both Eyes   Place 1 drop into both eyes at bedtime.           Marland Kitchen HYDROcodone-acetaminophen (NORCO/VICODIN) 5-325 MG per tablet   Oral   Take 1-2 tablets by mouth every 4 (four) hours as needed.   15 tablet   0    BP 115/56  Pulse 93  Temp(Src) 98.2 F (36.8 C) (Oral)  Resp 18  SpO2 91% Physical Exam  Nursing note and vitals reviewed. Constitutional: She is oriented to person, place, and time. She appears well-developed and well-nourished.  Uncomfortable   HENT:  Head: Normocephalic.  Mouth/Throat: Oropharynx is clear and moist.  No scalp hematoma   Eyes:  Conjunctivae are normal. Pupils are equal, round, and reactive to light.  Neck: Normal range of motion. Neck supple.  Cardiovascular: Normal rate, regular rhythm and normal heart sounds.   Pulmonary/Chest: Effort normal and breath sounds normal. No respiratory distress. She has no wheezes. She has no rales.  Abdominal: Soft. Bowel sounds are normal. She exhibits no distension. There is no tenderness. There is no rebound and no guarding.  Musculoskeletal:  R ankle swollen, tender on the lateral malleolus. Also tender 3rd to 5th toes. No tenderness tib/fib/knee/femur/hip. 2+ pulses   Neurological: She is alert and oriented to person, place, and time. No cranial nerve deficit. Coordination normal.  Skin: Skin is warm and dry.  Psychiatric: She has a normal mood and affect. Her behavior is normal. Judgment and thought content normal.    ED Course  Procedures (including critical care time) Labs Review Labs Reviewed - No data to display Imaging Review Dg Ankle Complete Right  04/21/2013   CLINICAL DATA:  Fall.  Ankle pain.  EXAM: RIGHT ANKLE - COMPLETE 3+ VIEW  COMPARISON:  Right foot radiographs 06/01/2011  FINDINGS: There is soft tissue swelling about the ankle, particularly laterally. There is a nondisplaced fracture through the lateral malleolus. Distal tibia appears intact. No dislocation is seen. Bones are osteopenic. Small plantar calcaneal enthesophyte is noted.  IMPRESSION: Nondisplaced lateral malleolus fracture.  Soft tissue swelling.   Electronically Signed   By: Logan Bores   On: 04/21/2013 17:40   Dg Foot Complete Right  04/21/2013   CLINICAL DATA:  Patient fell down steps today and injured right foot and ankle, pain through third fourth and fifth metatarsals of right foot  EXAM: RIGHT FOOT COMPLETE - 3+ VIEW  COMPARISON:  None.  FINDINGS: Small heel spur. Minimally displaced fracture involving the head of the fifth metatarsal with an oblique fracture through the distal aspect of the  bone. Distal fracture fragment is minimally displaced medially. No other fractures. Mild degenerative change first metatarsophalangeal joint.  IMPRESSION: Minimally displaced fracture distal fifth metatarsal extending into the metatarsophalangeal joint.   Electronically Signed   By: Skipper Cliche  M.D.   On: 04/21/2013 17:32     EKG Interpretation None      MDM   Final diagnoses:  Lateral malleolar fracture  Metatarsal bone fracture   Danielle [name] is a 68 y.o. female here with R ankle and foot injury. Did hit her head but not on anticoagulants and has normal neuro exam so no need for CT. xrays showed lateral malleolus fracture and minimally displaced fracture and  distal fifth metatarsal fracture. I called Dr. Berenice Primas, who recommend splint and outpatient f/u.    Wandra Arthurs, MD 04/21/13 [phone]

## 2013-04-21 NOTE — ED Notes (Signed)
Patient reports she fell when she was walking down steps and missed the bottom two. The left ankle is visibly swollen but patient reports this is "normal" for her. The right is grossly swollen compared to the left and patient is unable to move her last three toes. Bilateral ankles elevated. Norco given. Patient resting comfortably at this time.

## 2013-04-21 NOTE — ED Notes (Signed)
Pt was walking down a dark hallway and didn't realize there where two steps down and fell twisting her right ankle, which is swollen upon assessment in triage.  Pt states that she hit her head on concrete steps but denies LOC or hematoma.

## 2013-04-26 DIAGNOSIS — M25579 Pain in unspecified ankle and joints of unspecified foot: Secondary | ICD-10-CM | POA: Diagnosis not present

## 2013-05-16 DIAGNOSIS — N390 Urinary tract infection, site not specified: Secondary | ICD-10-CM | POA: Diagnosis not present

## 2013-05-17 DIAGNOSIS — M25579 Pain in unspecified ankle and joints of unspecified foot: Secondary | ICD-10-CM | POA: Diagnosis not present

## 2013-06-14 DIAGNOSIS — M25579 Pain in unspecified ankle and joints of unspecified foot: Secondary | ICD-10-CM | POA: Diagnosis not present

## 2013-07-25 DIAGNOSIS — I10 Essential (primary) hypertension: Secondary | ICD-10-CM | POA: Diagnosis not present

## 2013-07-25 DIAGNOSIS — L723 Sebaceous cyst: Secondary | ICD-10-CM | POA: Diagnosis not present

## 2013-07-25 DIAGNOSIS — Z1331 Encounter for screening for depression: Secondary | ICD-10-CM | POA: Diagnosis not present

## 2013-07-25 DIAGNOSIS — F4321 Adjustment disorder with depressed mood: Secondary | ICD-10-CM | POA: Diagnosis not present

## 2013-07-27 ENCOUNTER — Encounter (INDEPENDENT_AMBULATORY_CARE_PROVIDER_SITE_OTHER): Payer: Self-pay | Admitting: General Surgery

## 2013-07-27 ENCOUNTER — Ambulatory Visit (INDEPENDENT_AMBULATORY_CARE_PROVIDER_SITE_OTHER): Payer: Medicare Other | Admitting: General Surgery

## 2013-07-27 VITALS — BP 112/80 | HR 64 | Temp 99.4°F | Resp 14 | Ht 62.0 in | Wt 192.6 lb

## 2013-07-27 DIAGNOSIS — N6089 Other benign mammary dysplasias of unspecified breast: Secondary | ICD-10-CM | POA: Diagnosis not present

## 2013-07-27 DIAGNOSIS — N6082 Other benign mammary dysplasias of left breast: Secondary | ICD-10-CM

## 2013-07-27 MED ORDER — DOXYCYCLINE HYCLATE 100 MG PO TABS: 100.0000 mg | ORAL_TABLET | Freq: Two times a day (BID) | ORAL | Status: DC

## 2013-07-27 NOTE — Patient Instructions (Signed)
Epidermal Cyst An epidermal cyst is sometimes called a sebaceous cyst, epidermal inclusion cyst, or infundibular cyst. These cysts usually contain a substance that looks "pasty" or "cheesy" and may have a bad smell. This substance is a protein called keratin. Epidermal cysts are usually found on the face, neck, or trunk. They may also occur in the vaginal area or other parts of the genitalia of both men and women. Epidermal cysts are usually small, painless, slow-growing bumps or lumps that move freely under the skin. It is important not to try to pop them. This may cause an infection and lead to tenderness and swelling. CAUSES  Epidermal cysts may be caused by a deep penetrating injury to the skin or a plugged hair follicle, often associated with acne. SYMPTOMS  Epidermal cysts can become inflamed and cause:  Redness.  Tenderness.  Increased temperature of the skin over the bumps or lumps.  Grayish-white, bad smelling material that drains from the bump or lump. DIAGNOSIS  Epidermal cysts are easily diagnosed by your caregiver during an exam. Rarely, a tissue sample (biopsy) may be taken to rule out other conditions that may resemble epidermal cysts. TREATMENT   Epidermal cysts often get better and disappear on their own. They are rarely ever cancerous.  If a cyst becomes infected, it may become inflamed and tender. This may require opening and draining the cyst. Treatment with antibiotics may be necessary. When the infection is gone, the cyst may be removed with minor surgery.  Small, inflamed cysts can often be treated with antibiotics or by injecting steroid medicines.  Sometimes, epidermal cysts become large and bothersome. If this happens, surgical removal in your caregiver's office may be necessary. HOME CARE INSTRUCTIONS  Only take over-the-counter or prescription medicines as directed by your caregiver.  Take your antibiotics as directed. Finish them even if you start to feel  better. SEEK MEDICAL CARE IF:   Your cyst becomes tender, red, or swollen.  Your condition is not improving or is getting worse.  You have any other questions or concerns. MAKE SURE YOU:  Understand these instructions.  Will watch your condition.  Will get help right away if you are not doing well or get worse. Document Released: 11/30/2003 Document Revised: 03/23/2011 Document Reviewed: 07/07/2010 [employer] Patient Information 2015 [employer]. This information is not intended to replace advice given to you by your health care provider. Make sure you discuss any questions you have with your health care provider.  

## 2013-07-27 NOTE — Progress Notes (Signed)
Patient ID: Danielle [name], female   DOB: [birth year], 68 y.o.   MRN: [id number]  Chief Complaint  Patient presents with  . Mass    new pt- eval chest wall mass    HPI Danielle [name] is a 68 y.o. female.   HPI 68 yo female referred by Dr Kelton Pillar for evaluation of left breast cyst. Patient states that this cyst has been there for at least 6 months. It initially started as the size of a pencil eraser tip. But it has increased in size over the past couple months. It does cause her some discomfort especially where her bra rubs against it. It has never drained any fluid. It has been slightly red for the past couple of days. It does itch. She denies any fever, chills, weight loss. She denies any other soft tissue lumps. She denies any lymphadenopathy. She states that she had a mammogram and it was normal. Past Medical History  Diagnosis Date  . Difficult intubation 02-13-11    with surgery -multiple times  . Glaucoma 02-13-11    bil. tx. eye drops daily  . Sleep apnea 02-13-11    Hx. sleep apnea-uses cpap since 10'12  . Heart murmur 02-13-11    was told-no issues with this  . Sinus drainage 02-13-11    uses Zyrtec as needed  . Hypothyroidism 02-13-11    tx. with Levothyroxine  . GERD (gastroesophageal reflux disease) 02-13-11    tx. Omeprazole  . Headache(784.0) 02-13-11    past hx. migraines, none recent  . Arthritis 02-13-11    osteoarthritis, hips, knees,s/p Cervical fusion(DDD)  . Hypertension 02-13-11    tx. Atenolol    Past Surgical History  Procedure Laterality Date  . Abdominal hysterectomy  02-13-11  . Joint replacement  02-13-11    RTHA-a yr ago in [place]  . Cervical fusion  02-13-11    '92- retained hardware  . Cholecystectomy  02-13-11    '98-lap. galbladder removal due to stones  . Tubal ligation  02-13-11  . Appendectomy  02-13-11    Lap. removal '92  . Tonsillectomy  02-13-11    child  . Gum surgery  02-13-11    "small mouth"  . Total hip arthroplasty  02/17/2011    Procedure: TOTAL  HIP ARTHROPLASTY ANTERIOR APPROACH;  Surgeon: Mauri Pole, MD;  Location: [facility];  Service: Orthopedics;  Laterality: Left;    Family History  Problem Relation Age of Onset  . Cancer Mother     lung    Social History History  Substance Use Topics  . Smoking status: Never Smoker   . Smokeless tobacco: Not on file  . Alcohol Use: Yes     Comment: rare    Allergies  Allergen Reactions  . Ceftin [Cefuroxime Axetil] Hives and Swelling    Swelling of face  . Codeine Nausea Only  . Compazine [Prochlorperazine] Swelling and Other (See Comments)    Face and tongue swelling    Current Outpatient Prescriptions  Medication Sig Dispense Refill  . aspirin EC 81 MG tablet Take 81 mg by mouth every evening.      Marland Kitchen atenolol (TENORMIN) 25 MG tablet Take 25 mg by mouth every morning.        . calcium-vitamin D (OSCAL WITH D) 500-200 MG-UNIT per tablet Take 1 tablet by mouth 2 (two) times daily.        . cetirizine (ZYRTEC) 10 MG tablet Take 10 mg by mouth daily as needed. For allergies       .  DULoxetine (CYMBALTA) 60 MG capsule Take 60 mg by mouth daily.      . hydrochlorothiazide (HYDRODIURIL) 50 MG tablet Take 50 mg by mouth daily.      Marland Kitchen HYDROcodone-acetaminophen (NORCO/VICODIN) 5-325 MG per tablet Take 1-2 tablets by mouth every 4 (four) hours as needed.  15 tablet  0  . levothyroxine (SYNTHROID, LEVOTHROID) 112 MCG tablet Take 112 mcg by mouth every morning.        Marland Kitchen omeprazole (PRILOSEC) 20 MG capsule Take 20 mg by mouth daily as needed. For heart burn      . rosuvastatin (CRESTOR) 10 MG tablet Take 10 mg by mouth daily.      . travoprost, benzalkonium, (TRAVATAN) 0.004 % ophthalmic solution Place 1 drop into both eyes at bedtime.        . vitamin B-12 (CYANOCOBALAMIN) 500 MCG tablet Take 500 mcg by mouth daily.      Marland Kitchen doxycycline (VIBRA-TABS) 100 MG tablet Take 1 tablet (100 mg total) by mouth 2 (two) times daily.  10 tablet  0   No current facility-administered medications for  this visit.    Review of Systems Review of Systems  Constitutional: Negative for fever, activity change, appetite change and unexpected weight change.  HENT: Negative for nosebleeds and trouble swallowing.        Some nasal congestion  Eyes: Negative for photophobia and visual disturbance.  Respiratory: Negative for chest tightness and shortness of breath.   Cardiovascular: Negative for chest pain and leg swelling.       Denies CP, SOB, orthopnea, PND, DOE  Genitourinary: Negative for dysuria and difficulty urinating.  Musculoskeletal: Negative for arthralgias.       Some OA pains  Skin: Negative for pallor and rash.  Neurological: Negative for dizziness, seizures, facial asymmetry and numbness.       Denies TIA and amaurosis fugax   Hematological: Negative for adenopathy. Does not bruise/bleed easily.  Psychiatric/Behavioral: Negative for behavioral problems and agitation.    Blood pressure 112/80, pulse 64, temperature 99.4 F (37.4 C), temperature source Oral, resp. rate 14, height 5\' 2"  (1.575 m), weight 192 lb 9.6 oz (87.363 kg).  Physical Exam Physical Exam  Vitals reviewed. Constitutional: She is oriented to person, place, and time. She appears well-developed and well-nourished. No distress.  HENT:  Head: Normocephalic and atraumatic.  Right Ear: External ear normal.  Left Ear: External ear normal.  Eyes: Conjunctivae are normal. No scleral icterus.  Neck: Normal range of motion. Neck supple. No tracheal deviation present. No thyromegaly present.  Cardiovascular: Normal rate and normal heart sounds.   Pulmonary/Chest: Effort normal and breath sounds normal. No stridor. No respiratory distress. She has no wheezes. Left breast exhibits no inverted nipple, no mass, no nipple discharge, no skin change and no tenderness. Breasts are symmetrical.    Left medial breast/chest wall epidermoid inclusion cyst measuring 2 x 3 cm. There is a slight erythema of about 1 cm. There is  no fluctuance. It is mobile. A well circumscribed. Central pore.  Musculoskeletal: She exhibits no edema and no tenderness.  Lymphadenopathy:    She has no cervical adenopathy.       Left axillary: No lateral adenopathy present.      Right: No supraclavicular adenopathy present.       Left: No supraclavicular adenopathy present.  Neurological: She is alert and oriented to person, place, and time. She exhibits normal muscle tone.  Skin: Skin is warm and dry. No rash noted. She is  not diaphoretic. No erythema.  Psychiatric: She has a normal mood and affect. Her behavior is normal. Judgment and thought content normal.    Data Reviewed preop anesthesia note PCP office note  Assessment    Medial Left Breast Epidermoid Inclusion Cyst (sebaceous cyst)     Plan    We discussed the etiology and management of sebaceous cysts (epidermoid inclusion cysts). The patient was given educational material. We discussed that these lesions can become infected at times. We discussed the signs and symptoms of infection. We discussed the only way to eliminate these lesions is to surgically excise the cyst and its wall in its entirety.   We discussed observation versus surgical excision. We discussed the risks and benefits of surgery including but not limited to bleeding, infection, injury to surrounding structures, scarring, cosmetic concerns, blood clot formation, anesthesia issues, possible recurrence, and the typical postoperative course.   The patient has elected to proceed with excision of the chest wall sebaceous cyst in the near future. I am going to give her 5 days of doxycycline because of the mild cellulitis.  The scheduler's for contact her in the next few days to schedule surgery. We will do this under monitored anesthesia care  Leighton Ruff. Redmond Pulling, MD, FACS General, Bariatric, & Minimally Invasive Surgery [facility], [place]          [facility] M 07/27/2013, 10:43 AM

## 2013-08-24 DIAGNOSIS — Z01818 Encounter for other preprocedural examination: Secondary | ICD-10-CM | POA: Diagnosis not present

## 2013-08-29 ENCOUNTER — Other Ambulatory Visit (INDEPENDENT_AMBULATORY_CARE_PROVIDER_SITE_OTHER): Payer: Self-pay

## 2013-08-29 DIAGNOSIS — L723 Sebaceous cyst: Secondary | ICD-10-CM | POA: Diagnosis not present

## 2013-08-29 MED ORDER — HYDROCODONE-ACETAMINOPHEN 5-325 MG PO TABS: 1.0000 | ORAL_TABLET | ORAL | Status: DC | PRN

## 2014-03-15 DIAGNOSIS — I10 Essential (primary) hypertension: Secondary | ICD-10-CM | POA: Diagnosis not present

## 2014-03-15 DIAGNOSIS — R6889 Other general symptoms and signs: Secondary | ICD-10-CM | POA: Diagnosis not present

## 2014-03-15 DIAGNOSIS — Z23 Encounter for immunization: Secondary | ICD-10-CM | POA: Diagnosis not present

## 2014-03-15 DIAGNOSIS — E785 Hyperlipidemia, unspecified: Secondary | ICD-10-CM | POA: Diagnosis not present

## 2014-03-15 DIAGNOSIS — F329 Major depressive disorder, single episode, unspecified: Secondary | ICD-10-CM | POA: Diagnosis not present

## 2014-04-18 ENCOUNTER — Other Ambulatory Visit: Payer: Self-pay

## 2014-04-18 DIAGNOSIS — Z1231 Encounter for screening mammogram for malignant neoplasm of breast: Secondary | ICD-10-CM

## 2014-05-30 ENCOUNTER — Ambulatory Visit
Admission: RE | Admit: 2014-05-30 | Discharge: 2014-05-30 | Disposition: A | Discharge status: Home / Self Care | Payer: Medicare Other | Source: Ambulatory Visit

## 2014-05-30 ENCOUNTER — Ambulatory Visit: Payer: Medicare Other

## 2014-05-30 DIAGNOSIS — Z1231 Encounter for screening mammogram for malignant neoplasm of breast: Secondary | ICD-10-CM

## 2014-06-01 ENCOUNTER — Other Ambulatory Visit: Payer: Self-pay | Admitting: Internal Medicine

## 2014-06-01 DIAGNOSIS — R928 Other abnormal and inconclusive findings on diagnostic imaging of breast: Secondary | ICD-10-CM

## 2014-06-07 ENCOUNTER — Ambulatory Visit
Admission: RE | Admit: 2014-06-07 | Discharge: 2014-06-07 | Disposition: A | Discharge status: Home / Self Care | Payer: Medicare Other | Source: Ambulatory Visit | Attending: Internal Medicine | Admitting: Internal Medicine

## 2014-06-07 DIAGNOSIS — N6002 Solitary cyst of left breast: Secondary | ICD-10-CM | POA: Diagnosis not present

## 2014-06-07 DIAGNOSIS — R928 Other abnormal and inconclusive findings on diagnostic imaging of breast: Secondary | ICD-10-CM

## 2014-06-07 DIAGNOSIS — N63 Unspecified lump in breast: Secondary | ICD-10-CM | POA: Diagnosis not present

## 2014-09-20 DIAGNOSIS — R600 Localized edema: Secondary | ICD-10-CM | POA: Diagnosis not present

## 2014-09-20 DIAGNOSIS — F329 Major depressive disorder, single episode, unspecified: Secondary | ICD-10-CM | POA: Diagnosis not present

## 2014-09-20 DIAGNOSIS — M79674 Pain in right toe(s): Secondary | ICD-10-CM | POA: Diagnosis not present

## 2014-09-20 DIAGNOSIS — M8588 Other specified disorders of bone density and structure, other site: Secondary | ICD-10-CM | POA: Diagnosis not present

## 2014-09-20 DIAGNOSIS — N898 Other specified noninflammatory disorders of vagina: Secondary | ICD-10-CM | POA: Diagnosis not present

## 2014-09-20 DIAGNOSIS — E039 Hypothyroidism, unspecified: Secondary | ICD-10-CM | POA: Diagnosis not present

## 2014-09-20 DIAGNOSIS — Z1389 Encounter for screening for other disorder: Secondary | ICD-10-CM | POA: Diagnosis not present

## 2014-09-20 DIAGNOSIS — E785 Hyperlipidemia, unspecified: Secondary | ICD-10-CM | POA: Diagnosis not present

## 2014-09-20 DIAGNOSIS — I10 Essential (primary) hypertension: Secondary | ICD-10-CM | POA: Diagnosis not present

## 2014-09-20 DIAGNOSIS — Z0000 Encounter for general adult medical examination without abnormal findings: Secondary | ICD-10-CM | POA: Diagnosis not present

## 2014-09-24 DIAGNOSIS — M859 Disorder of bone density and structure, unspecified: Secondary | ICD-10-CM | POA: Diagnosis not present

## 2014-09-24 DIAGNOSIS — M8589 Other specified disorders of bone density and structure, multiple sites: Secondary | ICD-10-CM | POA: Diagnosis not present

## 2014-11-12 ENCOUNTER — Ambulatory Visit
Admission: RE | Admit: 2014-11-12 | Discharge: 2014-11-12 | Disposition: A | Discharge status: Home / Self Care | Payer: Medicare Other | Source: Ambulatory Visit | Attending: Nurse Practitioner | Admitting: Nurse Practitioner

## 2014-11-12 ENCOUNTER — Other Ambulatory Visit: Payer: Self-pay | Admitting: Nurse Practitioner

## 2014-11-12 DIAGNOSIS — M25561 Pain in right knee: Secondary | ICD-10-CM

## 2014-11-12 DIAGNOSIS — Z23 Encounter for immunization: Secondary | ICD-10-CM | POA: Diagnosis not present

## 2014-11-12 DIAGNOSIS — M7989 Other specified soft tissue disorders: Secondary | ICD-10-CM | POA: Diagnosis not present

## 2014-11-22 DIAGNOSIS — M25561 Pain in right knee: Secondary | ICD-10-CM | POA: Diagnosis not present

## 2015-01-10 DIAGNOSIS — M25561 Pain in right knee: Secondary | ICD-10-CM | POA: Diagnosis not present

## 2015-01-22 DIAGNOSIS — M25561 Pain in right knee: Secondary | ICD-10-CM | POA: Diagnosis not present

## 2015-01-28 DIAGNOSIS — M94261 Chondromalacia, right knee: Secondary | ICD-10-CM | POA: Diagnosis not present

## 2015-01-28 DIAGNOSIS — M25561 Pain in right knee: Secondary | ICD-10-CM | POA: Diagnosis not present

## 2015-01-28 DIAGNOSIS — S83281A Other tear of lateral meniscus, current injury, right knee, initial encounter: Secondary | ICD-10-CM | POA: Diagnosis not present

## 2015-02-06 DIAGNOSIS — M4806 Spinal stenosis, lumbar region: Secondary | ICD-10-CM | POA: Diagnosis not present

## 2015-02-06 DIAGNOSIS — M25562 Pain in left knee: Secondary | ICD-10-CM | POA: Diagnosis not present

## 2015-02-06 DIAGNOSIS — M2241 Chondromalacia patellae, right knee: Secondary | ICD-10-CM | POA: Diagnosis not present

## 2015-02-06 DIAGNOSIS — M2242 Chondromalacia patellae, left knee: Secondary | ICD-10-CM | POA: Diagnosis not present

## 2015-02-06 DIAGNOSIS — M4316 Spondylolisthesis, lumbar region: Secondary | ICD-10-CM | POA: Diagnosis not present

## 2015-02-06 DIAGNOSIS — M25561 Pain in right knee: Secondary | ICD-10-CM | POA: Diagnosis not present

## 2015-02-08 DIAGNOSIS — M25561 Pain in right knee: Secondary | ICD-10-CM | POA: Diagnosis not present

## 2015-02-08 DIAGNOSIS — M2242 Chondromalacia patellae, left knee: Secondary | ICD-10-CM | POA: Diagnosis not present

## 2015-02-08 DIAGNOSIS — M2241 Chondromalacia patellae, right knee: Secondary | ICD-10-CM | POA: Diagnosis not present

## 2015-02-08 DIAGNOSIS — M25562 Pain in left knee: Secondary | ICD-10-CM | POA: Diagnosis not present

## 2015-02-18 DIAGNOSIS — M2241 Chondromalacia patellae, right knee: Secondary | ICD-10-CM | POA: Diagnosis not present

## 2015-02-18 DIAGNOSIS — M2242 Chondromalacia patellae, left knee: Secondary | ICD-10-CM | POA: Diagnosis not present

## 2015-02-18 DIAGNOSIS — M25562 Pain in left knee: Secondary | ICD-10-CM | POA: Diagnosis not present

## 2015-02-18 DIAGNOSIS — M25561 Pain in right knee: Secondary | ICD-10-CM | POA: Diagnosis not present

## 2015-02-20 DIAGNOSIS — M25562 Pain in left knee: Secondary | ICD-10-CM | POA: Diagnosis not present

## 2015-02-20 DIAGNOSIS — M2241 Chondromalacia patellae, right knee: Secondary | ICD-10-CM | POA: Diagnosis not present

## 2015-02-20 DIAGNOSIS — M25561 Pain in right knee: Secondary | ICD-10-CM | POA: Diagnosis not present

## 2015-02-20 DIAGNOSIS — M2242 Chondromalacia patellae, left knee: Secondary | ICD-10-CM | POA: Diagnosis not present

## 2015-02-26 DIAGNOSIS — M25561 Pain in right knee: Secondary | ICD-10-CM | POA: Diagnosis not present

## 2015-02-26 DIAGNOSIS — M2241 Chondromalacia patellae, right knee: Secondary | ICD-10-CM | POA: Diagnosis not present

## 2015-02-26 DIAGNOSIS — M25562 Pain in left knee: Secondary | ICD-10-CM | POA: Diagnosis not present

## 2015-02-26 DIAGNOSIS — M2242 Chondromalacia patellae, left knee: Secondary | ICD-10-CM | POA: Diagnosis not present

## 2015-02-28 DIAGNOSIS — M2242 Chondromalacia patellae, left knee: Secondary | ICD-10-CM | POA: Diagnosis not present

## 2015-02-28 DIAGNOSIS — M2241 Chondromalacia patellae, right knee: Secondary | ICD-10-CM | POA: Diagnosis not present

## 2015-02-28 DIAGNOSIS — M25561 Pain in right knee: Secondary | ICD-10-CM | POA: Diagnosis not present

## 2015-02-28 DIAGNOSIS — M25562 Pain in left knee: Secondary | ICD-10-CM | POA: Diagnosis not present

## 2015-03-27 DIAGNOSIS — M4806 Spinal stenosis, lumbar region: Secondary | ICD-10-CM | POA: Diagnosis not present

## 2015-03-27 DIAGNOSIS — M4310 Spondylolisthesis, site unspecified: Secondary | ICD-10-CM | POA: Diagnosis not present

## 2015-04-15 DIAGNOSIS — M25562 Pain in left knee: Secondary | ICD-10-CM | POA: Diagnosis not present

## 2015-04-15 DIAGNOSIS — M2242 Chondromalacia patellae, left knee: Secondary | ICD-10-CM | POA: Diagnosis not present

## 2015-04-15 DIAGNOSIS — M2241 Chondromalacia patellae, right knee: Secondary | ICD-10-CM | POA: Diagnosis not present

## 2015-04-15 DIAGNOSIS — M25561 Pain in right knee: Secondary | ICD-10-CM | POA: Diagnosis not present

## 2015-04-16 DIAGNOSIS — M2242 Chondromalacia patellae, left knee: Secondary | ICD-10-CM | POA: Diagnosis not present

## 2015-04-16 DIAGNOSIS — M2241 Chondromalacia patellae, right knee: Secondary | ICD-10-CM | POA: Diagnosis not present

## 2015-04-16 DIAGNOSIS — M25562 Pain in left knee: Secondary | ICD-10-CM | POA: Diagnosis not present

## 2015-04-16 DIAGNOSIS — M25561 Pain in right knee: Secondary | ICD-10-CM | POA: Diagnosis not present

## 2015-04-18 DIAGNOSIS — M25562 Pain in left knee: Secondary | ICD-10-CM | POA: Diagnosis not present

## 2015-04-18 DIAGNOSIS — M25561 Pain in right knee: Secondary | ICD-10-CM | POA: Diagnosis not present

## 2015-04-18 DIAGNOSIS — M2241 Chondromalacia patellae, right knee: Secondary | ICD-10-CM | POA: Diagnosis not present

## 2015-04-18 DIAGNOSIS — M2242 Chondromalacia patellae, left knee: Secondary | ICD-10-CM | POA: Diagnosis not present

## 2015-04-26 DIAGNOSIS — M2242 Chondromalacia patellae, left knee: Secondary | ICD-10-CM | POA: Diagnosis not present

## 2015-04-26 DIAGNOSIS — M25562 Pain in left knee: Secondary | ICD-10-CM | POA: Diagnosis not present

## 2015-04-26 DIAGNOSIS — M2241 Chondromalacia patellae, right knee: Secondary | ICD-10-CM | POA: Diagnosis not present

## 2015-04-26 DIAGNOSIS — M25561 Pain in right knee: Secondary | ICD-10-CM | POA: Diagnosis not present

## 2015-04-29 DIAGNOSIS — M25562 Pain in left knee: Secondary | ICD-10-CM | POA: Diagnosis not present

## 2015-04-29 DIAGNOSIS — M2242 Chondromalacia patellae, left knee: Secondary | ICD-10-CM | POA: Diagnosis not present

## 2015-04-29 DIAGNOSIS — M2241 Chondromalacia patellae, right knee: Secondary | ICD-10-CM | POA: Diagnosis not present

## 2015-04-29 DIAGNOSIS — M25561 Pain in right knee: Secondary | ICD-10-CM | POA: Diagnosis not present

## 2015-04-30 DIAGNOSIS — M25561 Pain in right knee: Secondary | ICD-10-CM | POA: Diagnosis not present

## 2015-04-30 DIAGNOSIS — M2242 Chondromalacia patellae, left knee: Secondary | ICD-10-CM | POA: Diagnosis not present

## 2015-04-30 DIAGNOSIS — M2241 Chondromalacia patellae, right knee: Secondary | ICD-10-CM | POA: Diagnosis not present

## 2015-04-30 DIAGNOSIS — M25562 Pain in left knee: Secondary | ICD-10-CM | POA: Diagnosis not present

## 2015-05-02 DIAGNOSIS — M2241 Chondromalacia patellae, right knee: Secondary | ICD-10-CM | POA: Diagnosis not present

## 2015-05-02 DIAGNOSIS — M2242 Chondromalacia patellae, left knee: Secondary | ICD-10-CM | POA: Diagnosis not present

## 2015-05-02 DIAGNOSIS — M25562 Pain in left knee: Secondary | ICD-10-CM | POA: Diagnosis not present

## 2015-05-02 DIAGNOSIS — M25561 Pain in right knee: Secondary | ICD-10-CM | POA: Diagnosis not present

## 2015-05-06 DIAGNOSIS — M2242 Chondromalacia patellae, left knee: Secondary | ICD-10-CM | POA: Diagnosis not present

## 2015-05-06 DIAGNOSIS — M2241 Chondromalacia patellae, right knee: Secondary | ICD-10-CM | POA: Diagnosis not present

## 2015-05-06 DIAGNOSIS — M25561 Pain in right knee: Secondary | ICD-10-CM | POA: Diagnosis not present

## 2015-05-06 DIAGNOSIS — M25562 Pain in left knee: Secondary | ICD-10-CM | POA: Diagnosis not present

## 2015-05-07 DIAGNOSIS — M25561 Pain in right knee: Secondary | ICD-10-CM | POA: Diagnosis not present

## 2015-05-07 DIAGNOSIS — M25562 Pain in left knee: Secondary | ICD-10-CM | POA: Diagnosis not present

## 2015-05-07 DIAGNOSIS — M2242 Chondromalacia patellae, left knee: Secondary | ICD-10-CM | POA: Diagnosis not present

## 2015-05-07 DIAGNOSIS — M2241 Chondromalacia patellae, right knee: Secondary | ICD-10-CM | POA: Diagnosis not present

## 2015-05-09 DIAGNOSIS — L255 Unspecified contact dermatitis due to plants, except food: Secondary | ICD-10-CM | POA: Diagnosis not present

## 2015-05-16 ENCOUNTER — Emergency Department (HOSPITAL_COMMUNITY): Payer: Medicare Other

## 2015-05-16 ENCOUNTER — Observation Stay (HOSPITAL_COMMUNITY)
Admission: EM | Admit: 2015-05-16 | Discharge: 2015-05-17 | Disposition: A | Discharge status: Home / Self Care | Payer: Medicare Other | Attending: Specialist | Admitting: Specialist

## 2015-05-16 ENCOUNTER — Encounter (HOSPITAL_COMMUNITY): Payer: Self-pay | Admitting: *Deleted

## 2015-05-16 DIAGNOSIS — E039 Hypothyroidism, unspecified: Secondary | ICD-10-CM | POA: Diagnosis not present

## 2015-05-16 DIAGNOSIS — Z9989 Dependence on other enabling machines and devices: Secondary | ICD-10-CM | POA: Insufficient documentation

## 2015-05-16 DIAGNOSIS — Z79899 Other long term (current) drug therapy: Secondary | ICD-10-CM | POA: Insufficient documentation

## 2015-05-16 DIAGNOSIS — S82121A Displaced fracture of lateral condyle of right tibia, initial encounter for closed fracture: Secondary | ICD-10-CM | POA: Diagnosis not present

## 2015-05-16 DIAGNOSIS — Z96642 Presence of left artificial hip joint: Secondary | ICD-10-CM | POA: Diagnosis not present

## 2015-05-16 DIAGNOSIS — Z79891 Long term (current) use of opiate analgesic: Secondary | ICD-10-CM | POA: Diagnosis not present

## 2015-05-16 DIAGNOSIS — S82209A Unspecified fracture of shaft of unspecified tibia, initial encounter for closed fracture: Secondary | ICD-10-CM | POA: Diagnosis present

## 2015-05-16 DIAGNOSIS — G4730 Sleep apnea, unspecified: Secondary | ICD-10-CM | POA: Diagnosis not present

## 2015-05-16 DIAGNOSIS — E7800 Pure hypercholesterolemia, unspecified: Secondary | ICD-10-CM | POA: Diagnosis not present

## 2015-05-16 DIAGNOSIS — H409 Unspecified glaucoma: Secondary | ICD-10-CM | POA: Diagnosis not present

## 2015-05-16 DIAGNOSIS — S82141A Displaced bicondylar fracture of right tibia, initial encounter for closed fracture: Principal | ICD-10-CM | POA: Insufficient documentation

## 2015-05-16 DIAGNOSIS — W010XXA Fall on same level from slipping, tripping and stumbling without subsequent striking against object, initial encounter: Secondary | ICD-10-CM | POA: Diagnosis not present

## 2015-05-16 DIAGNOSIS — K219 Gastro-esophageal reflux disease without esophagitis: Secondary | ICD-10-CM | POA: Insufficient documentation

## 2015-05-16 DIAGNOSIS — M170 Bilateral primary osteoarthritis of knee: Secondary | ICD-10-CM | POA: Insufficient documentation

## 2015-05-16 DIAGNOSIS — Z7982 Long term (current) use of aspirin: Secondary | ICD-10-CM | POA: Diagnosis not present

## 2015-05-16 DIAGNOSIS — I10 Essential (primary) hypertension: Secondary | ICD-10-CM | POA: Diagnosis not present

## 2015-05-16 DIAGNOSIS — M160 Bilateral primary osteoarthritis of hip: Secondary | ICD-10-CM | POA: Insufficient documentation

## 2015-05-16 DIAGNOSIS — M25561 Pain in right knee: Secondary | ICD-10-CM | POA: Diagnosis present

## 2015-05-16 DIAGNOSIS — S8991XA Unspecified injury of right lower leg, initial encounter: Secondary | ICD-10-CM | POA: Diagnosis not present

## 2015-05-16 DIAGNOSIS — W108XXA Fall (on) (from) other stairs and steps, initial encounter: Secondary | ICD-10-CM | POA: Insufficient documentation

## 2015-05-16 DIAGNOSIS — T148 Other injury of unspecified body region: Secondary | ICD-10-CM | POA: Diagnosis not present

## 2015-05-16 DIAGNOSIS — M7989 Other specified soft tissue disorders: Secondary | ICD-10-CM | POA: Diagnosis not present

## 2015-05-16 DIAGNOSIS — S82143A Displaced bicondylar fracture of unspecified tibia, initial encounter for closed fracture: Secondary | ICD-10-CM | POA: Diagnosis present

## 2015-05-16 HISTORY — DX: Pure hypercholesterolemia, unspecified: E78.00

## 2015-05-16 LAB — BASIC METABOLIC PANEL
Anion gap: 12 (ref 5–15)
BUN: 23 mg/dL — ABNORMAL HIGH (ref 6–20)
CALCIUM: 8.8 mg/dL — AB (ref 8.9–10.3)
CHLORIDE: 100 mmol/L — AB (ref 101–111)
CO2: 26 mmol/L (ref 22–32)
Creatinine, Ser: 0.81 mg/dL (ref 0.44–1.00)
GFR calc Af Amer: >60 mL/min (ref >60)
GFR calc non Af Amer: >60 mL/min (ref >60)
Glucose, Bld: 136 mg/dL — ABNORMAL HIGH (ref 65–99)
Potassium: 3.9 mmol/L (ref 3.5–5.1)
Sodium: 138 mmol/L (ref 135–145)

## 2015-05-16 LAB — CBC
HEMATOCRIT: 42.8 % (ref 36.0–46.0)
HEMOGLOBIN: 14.1 g/dL (ref 12.0–15.0)
MCH: 30.1 pg (ref 26.0–34.0)
MCHC: 32.9 g/dL (ref 30.0–36.0)
MCV: 91.3 fL (ref 78.0–100.0)
Platelets: 209 10*3/uL (ref 150–400)
RBC: 4.69 MIL/uL (ref 3.87–5.11)
RDW: 13.7 % (ref 11.5–15.5)
WBC: 15.5 10*3/uL — ABNORMAL HIGH (ref 4.0–10.5)

## 2015-05-16 MED ORDER — LEVOTHYROXINE SODIUM 112 MCG PO TABS
112.0000 ug | ORAL_TABLET | Freq: Every day | ORAL | Status: DC
  Administered 2015-05-17: 112 ug via ORAL
  Filled 2015-05-16 (×2): qty 1

## 2015-05-16 MED ORDER — TRAVOPROST 0.004 % OP SOLN: 1.0000 [drp] | Freq: Every day | OPHTHALMIC | Status: DC

## 2015-05-16 MED ORDER — ONDANSETRON HCL 4 MG/2ML IJ SOLN
4.0000 mg | Freq: Once | INTRAMUSCULAR | Status: AC
  Administered 2015-05-16: 4 mg via INTRAVENOUS
  Filled 2015-05-16: qty 2

## 2015-05-16 MED ORDER — PANTOPRAZOLE SODIUM 40 MG PO TBEC
40.0000 mg | DELAYED_RELEASE_TABLET | Freq: Every day | ORAL | Status: DC
  Administered 2015-05-16 – 2015-05-17 (×2): 40 mg via ORAL
  Filled 2015-05-16 (×2): qty 1

## 2015-05-16 MED ORDER — SODIUM CHLORIDE 0.9% FLUSH
3.0000 mL | Freq: Two times a day (BID) | INTRAVENOUS | Status: DC
  Administered 2015-05-16 – 2015-05-17 (×2): 3 mL via INTRAVENOUS

## 2015-05-16 MED ORDER — SENNOSIDES-DOCUSATE SODIUM 8.6-50 MG PO TABS: 1.0000 | ORAL_TABLET | Freq: Every evening | ORAL | Status: DC | PRN

## 2015-05-16 MED ORDER — HYDROCHLOROTHIAZIDE 25 MG PO TABS
25.0000 mg | ORAL_TABLET | Freq: Every day | ORAL | Status: DC
  Administered 2015-05-16 – 2015-05-17 (×2): 25 mg via ORAL
  Filled 2015-05-16 (×2): qty 1

## 2015-05-16 MED ORDER — SODIUM CHLORIDE 0.9 % IV SOLN: 250.0000 mL | INTRAVENOUS | Status: DC | PRN

## 2015-05-16 MED ORDER — OXYCODONE HCL 5 MG PO TABS
5.0000 mg | ORAL_TABLET | ORAL | Status: DC | PRN
  Administered 2015-05-16 – 2015-05-17 (×2): 5 mg via ORAL
  Filled 2015-05-16 (×2): qty 1

## 2015-05-16 MED ORDER — MORPHINE SULFATE (PF) 4 MG/ML IV SOLN
4.0000 mg | Freq: Once | INTRAVENOUS | Status: AC
  Administered 2015-05-16: 4 mg via INTRAVENOUS
  Filled 2015-05-16: qty 1

## 2015-05-16 MED ORDER — LEVOTHYROXINE SODIUM 112 MCG PO CAPS: 112.0000 ug | ORAL_CAPSULE | Freq: Every day | ORAL | Status: DC

## 2015-05-16 MED ORDER — DULOXETINE HCL 60 MG PO CPEP
60.0000 mg | ORAL_CAPSULE | Freq: Every day | ORAL | Status: DC
  Administered 2015-05-16 – 2015-05-17 (×2): 60 mg via ORAL
  Filled 2015-05-16 (×2): qty 1

## 2015-05-16 MED ORDER — BISACODYL 5 MG PO TBEC
5.0000 mg | DELAYED_RELEASE_TABLET | Freq: Every day | ORAL | Status: DC | PRN
  Filled 2015-05-16: qty 1

## 2015-05-16 MED ORDER — ROSUVASTATIN CALCIUM 20 MG PO TABS
20.0000 mg | ORAL_TABLET | Freq: Every day | ORAL | Status: DC
  Administered 2015-05-16 – 2015-05-17 (×2): 20 mg via ORAL
  Filled 2015-05-16 (×2): qty 1

## 2015-05-16 MED ORDER — MAGNESIUM CITRATE PO SOLN
1.0000 | Freq: Once | ORAL | Status: DC | PRN
Start: 2015-05-16 — End: 2015-05-17

## 2015-05-16 MED ORDER — CYANOCOBALAMIN 500 MCG PO TABS
500.0000 ug | ORAL_TABLET | Freq: Every day | ORAL | Status: DC
  Administered 2015-05-16 – 2015-05-17 (×2): 500 ug via ORAL
  Filled 2015-05-16 (×2): qty 1

## 2015-05-16 MED ORDER — OXYCODONE HCL 5 MG PO TABS
5.0000 mg | ORAL_TABLET | Freq: Once | ORAL | Status: AC
  Administered 2015-05-16: 5 mg via ORAL
  Filled 2015-05-16: qty 1

## 2015-05-16 MED ORDER — LATANOPROST 0.005 % OP SOLN
1.0000 [drp] | Freq: Every day | OPHTHALMIC | Status: DC
  Administered 2015-05-16: 1 [drp] via OPHTHALMIC
  Filled 2015-05-16: qty 2.5

## 2015-05-16 MED ORDER — TRAVOPROST (BAK FREE) 0.004 % OP SOLN
1.0000 [drp] | Freq: Every day | OPHTHALMIC | Status: DC
  Filled 2015-05-16: qty 2.5

## 2015-05-16 MED ORDER — CALCIUM CARBONATE-VITAMIN D 500-200 MG-UNIT PO TABS
1.0000 | ORAL_TABLET | Freq: Two times a day (BID) | ORAL | Status: DC
  Administered 2015-05-16 – 2015-05-17 (×2): 1 via ORAL
  Filled 2015-05-16 (×3): qty 1

## 2015-05-16 MED ORDER — ASPIRIN EC 325 MG PO TBEC
325.0000 mg | DELAYED_RELEASE_TABLET | Freq: Every day | ORAL | Status: DC
  Filled 2015-05-16: qty 1

## 2015-05-16 MED ORDER — HYDROMORPHONE HCL 1 MG/ML IJ SOLN
0.5000 mg | INTRAMUSCULAR | Status: DC | PRN
  Administered 2015-05-16 – 2015-05-17 (×4): 0.5 mg via INTRAVENOUS
  Filled 2015-05-16 (×4): qty 1

## 2015-05-16 MED ORDER — DOCUSATE SODIUM 100 MG PO CAPS
100.0000 mg | ORAL_CAPSULE | Freq: Two times a day (BID) | ORAL | Status: DC
  Administered 2015-05-16 – 2015-05-17 (×2): 100 mg via ORAL

## 2015-05-16 MED ORDER — SODIUM CHLORIDE 0.9% FLUSH: 3.0000 mL | INTRAVENOUS | Status: DC | PRN

## 2015-05-16 MED ORDER — VITAMIN B-12 500 MCG PO TABS: 500.0000 ug | ORAL_TABLET | Freq: Every day | ORAL | Status: DC

## 2015-05-16 MED ORDER — ENOXAPARIN SODIUM 40 MG/0.4ML ~~LOC~~ SOLN
40.0000 mg | SUBCUTANEOUS | Status: DC
  Administered 2015-05-17: 40 mg via SUBCUTANEOUS
  Filled 2015-05-16 (×2): qty 0.4

## 2015-05-16 MED ORDER — ATENOLOL 25 MG PO TABS
25.0000 mg | ORAL_TABLET | Freq: Every day | ORAL | Status: DC
  Administered 2015-05-17: 25 mg via ORAL
  Filled 2015-05-16: qty 1

## 2015-05-16 MED ORDER — LORATADINE 10 MG PO TABS
10.0000 mg | ORAL_TABLET | Freq: Every day | ORAL | Status: DC
  Administered 2015-05-16 – 2015-05-17 (×2): 10 mg via ORAL
  Filled 2015-05-16 (×2): qty 1

## 2015-05-16 NOTE — ED Provider Notes (Signed)
CSN: [id number]     Arrival date & time 05/16/15  [id number] History   First MD Initiated Contact with Patient 05/16/15 0645     Chief Complaint  Patient presents with  . Knee Pain   (Consider location/radiation/quality/duration/timing/severity/associated sxs/prior Treatment) HPI  70 y.o. female presents to the Emergency Department today via EMS s/p mechanical fall and complaining of right knee pain. Pt states that she was ambulating down the stairs when she slipped on the carpet due to being barefooted. She went down three steps. No head trauma/LOC. Notes brunt of impact on right knee with her weight collapsing on top of it and bending backwards. Notes hx of meniscal tear and currently undergoing PT for that knee. No surgeries to the area. Notes pain is 9/10 and throbbing with localized tenderness to lateral aspect. Notes nausea en route via EMS and given zofran. No CP/SOB/ABD pain. No emesis. No headaches. No fevers. No other symptoms noted.   Past Medical History  Diagnosis Date  . Difficult intubation 02-13-11    with surgery -multiple times  . Glaucoma 02-13-11    bil. tx. eye drops daily  . Sleep apnea 02-13-11    Hx. sleep apnea-uses cpap since 10'12  . Heart murmur 02-13-11    was told-no issues with this  . Sinus drainage 02-13-11    uses Zyrtec as needed  . Hypothyroidism 02-13-11    tx. with Levothyroxine  . GERD (gastroesophageal reflux disease) 02-13-11    tx. Omeprazole  . Headache(784.0) 02-13-11    past hx. migraines, none recent  . Arthritis 02-13-11    osteoarthritis, hips, knees,s/p Cervical fusion(DDD)  . Hypertension 02-13-11    tx. Atenolol  . Hypercholesterolemia    Past Surgical History  Procedure Laterality Date  . Abdominal hysterectomy  02-13-11  . Joint replacement  02-13-11    RTHA-a yr ago in [place]  . Cervical fusion  02-13-11    '92- retained hardware  . Cholecystectomy  02-13-11    '98-lap. galbladder removal due to stones  . Tubal ligation  02-13-11  . Appendectomy   02-13-11    Lap. removal '92  . Tonsillectomy  02-13-11    child  . Gum surgery  02-13-11    "small mouth"  . Total hip arthroplasty  02/17/2011    Procedure: TOTAL HIP ARTHROPLASTY ANTERIOR APPROACH;  Surgeon: Mauri Pole, MD;  Location: [facility];  Service: Orthopedics;  Laterality: Left;   Family History  Problem Relation Age of Onset  . Cancer Mother     lung   Social History  Substance Use Topics  . Smoking status: Never Smoker   . Smokeless tobacco: None  . Alcohol Use: Yes     Comment: rare   OB History    No data available     Review of Systems  Constitutional: Negative for fever.  Gastrointestinal: Positive for nausea. Negative for vomiting.  Musculoskeletal: Positive for joint swelling and arthralgias.  ROS reviewed and all are negative for acute change except as noted in the HPI.  Allergies  Ceftin; Codeine; and Compazine  Home Medications   Prior to Admission medications   Medication Sig Start Date End Date Taking? Authorizing Provider  aspirin EC 81 MG tablet Take 81 mg by mouth every evening.    Historical Provider, MD  atenolol (TENORMIN) 25 MG tablet Take 25 mg by mouth every morning.      Historical Provider, MD  calcium-vitamin D (OSCAL WITH D) 500-200 MG-UNIT per tablet Take 1  tablet by mouth 2 (two) times daily.      Historical Provider, MD  cetirizine (ZYRTEC) 10 MG tablet Take 10 mg by mouth daily as needed. For allergies     Historical Provider, MD  doxycycline (VIBRA-TABS) 100 MG tablet Take 1 tablet (100 mg total) by mouth 2 (two) times daily. 07/27/13   Greer Pickerel, MD  DULoxetine (CYMBALTA) 60 MG capsule Take 60 mg by mouth daily.    Historical Provider, MD  hydrochlorothiazide (HYDRODIURIL) 50 MG tablet Take 50 mg by mouth daily.    Historical Provider, MD  HYDROcodone-acetaminophen (NORCO/VICODIN) 5-325 MG per tablet Take 1-2 tablets by mouth every 4 (four) hours as needed. 04/21/13   Wandra Arthurs, MD  HYDROcodone-acetaminophen (NORCO/VICODIN) 5-325 MG  per tablet Take 1-2 tablets by mouth every 4 (four) hours as needed for moderate pain or severe pain. 08/29/13   Greer Pickerel, MD  levothyroxine (SYNTHROID, LEVOTHROID) 112 MCG tablet Take 112 mcg by mouth every morning.      Historical Provider, MD  omeprazole (PRILOSEC) 20 MG capsule Take 20 mg by mouth daily as needed. For heart burn    Historical Provider, MD  rosuvastatin (CRESTOR) 10 MG tablet Take 10 mg by mouth daily.    Historical Provider, MD  travoprost, benzalkonium, (TRAVATAN) 0.004 % ophthalmic solution Place 1 drop into both eyes at bedtime.      Historical Provider, MD  vitamin B-12 (CYANOCOBALAMIN) 500 MCG tablet Take 500 mcg by mouth daily.    Historical Provider, MD   BP 116/54 mmHg  Pulse 73  Temp(Src) 97.8 F (36.6 C) (Oral)  Resp 18  SpO2 96%   Physical Exam  Constitutional: She is oriented to person, place, and time. She appears well-developed and well-nourished.  HENT:  Head: Normocephalic and atraumatic.  Eyes: EOM are normal.  Neck: Normal range of motion.  Cardiovascular: Normal rate and regular rhythm.   Pulmonary/Chest: Effort normal.  Abdominal: Soft.  Musculoskeletal:       Right knee: She exhibits decreased range of motion and swelling. She exhibits no ecchymosis and no deformity.  Negative anterior/poster drawer bilaterally. Negative ballottement test. No varus or valgus laxity. No crepitus. Pain with flexion and extension. TTP on lateral aspect with mild swelling noted. Distal pulses intact. Cap refill <2sec. Motor/sensation intact. Compartments soft    Neurological: She is alert and oriented to person, place, and time.  Skin: Skin is warm and dry.  Psychiatric: She has a normal mood and affect. Her behavior is normal. Thought content normal.  Nursing note and vitals reviewed.  ED Course  Procedures (including critical care time) Labs Review Labs Reviewed - No data to display  Imaging Review Ct Knee Right Wo Contrast  05/16/2015  CLINICAL DATA:   [place] today and injured right knee. Pain and swelling. Evaluate lateral tibial plateau fracture. EXAM: CT OF THE right KNEE WITHOUT CONTRAST TECHNIQUE: Multidetector CT imaging of the right knee was performed according to the standard protocol. Multiplanar CT image reconstructions were also generated. COMPARISON:  Radiographs 05/16/2015 FINDINGS: There are vertical/longitudinal fractures involving the lateral tibial plateau without significant displacement. There is minimal depression involving the posterior aspect of the tibial plateau estimated at 3 mm. The medial tibial plateau is intact. No femur, fibula or patella fracture. Associated large lipohemarthrosis. The quadriceps pelvic tendons are intact. No obvious ligamentous injury. IMPRESSION: Longitudinal/vertical fractures involving the lateral tibial plateau with minimal depression posteriorly estimated at 3 mm. No other significant bony findings. Associated large lipohemarthrosis. Grossly intact  ligamentous structures. Electronically Signed   By: Marijo Sanes M.D.   On: 05/16/2015 09:06   Dg Knee Complete 4 Views Right  05/16/2015  CLINICAL DATA:  Fall.  Initial evaluation. EXAM: RIGHT KNEE - COMPLETE 4+ VIEW COMPARISON:  11/12/2014. FINDINGS: Moderate knee joint effusion noted. Slight depressed fracture of the lateral tibial plateau appears to be present. Linear lucency noted Reading longitudinally in the upper tibia on oblique view only is most likely related overlying fat plane. Diffuse degenerative change present. IMPRESSION: 1. Slightly displaced fracture of the lateral tibial plateau. Moderate knee joint effusion. 2. Tricompartment degenerative change. Electronically Signed   By: Marcello Moores  Register   On: 05/16/2015 07:51   I have personally reviewed and evaluated these images and lab results as part of my medical decision-making.   EKG Interpretation None      MDM  I have reviewed and evaluated the relevant imaging studies.  I have reviewed  the relevant previous healthcare records. I obtained HPI from historian.  ED Course:  Assessment: Pt is a 70yF who presents s/p mechanical fall vis EMS. Localized right knee pain on lateral aspect. Hx of meniscal tear with PT and no surgeries. On exam, pt in NAD. Nontoxic/nonseptic appearing. VSS. Afebrile. Right knee limited ROM due to pain. No laxity of ligaments appreciated. TTP on lateral aspect with mild swelling noted. XR showed lateral tibial plateau fracture with minimal displacement. Pt is neurovascularly intact. Compartments are soft. Given analgesia in ED. Plan is to DC home with knee immobilizer and crutches. CT of right knee ordered.   Consult with Ortho Dr. Tonita Cong who will see patient on admission due to pt unable to ambulate due to pain.    Disposition/Plan:  Admit Pt acknowledges and agrees with plan  Supervising Physician Sharlett Iles, MD   Final diagnoses:  Tibial plateau fracture, right, closed, initial encounter      Shary Decamp, PA-C 05/16/15 [place], MD 05/16/15 2142

## 2015-05-16 NOTE — Care Management Obs Status (Signed)
[facility] NOTIFICATION   Patient Details  Name: Danielle [name] MRN: [id number] Date of Birth: July 23, 1945   Medicare Observation Status Notification Given:   (r/o right knee fracture)    Robbie Lis, RN 05/16/2015, 12:18 PM

## 2015-05-16 NOTE — ED Notes (Signed)
MADE TWO UNSUCCESSFUL BLOOD DRAW ATTEMPTS

## 2015-05-16 NOTE — ED Notes (Signed)
Attempted to ambulated patient with the help of NT. Patient able to sit on the side of the bed. Patient unable to get to a standing position with the use of crutches or assistance from myself or NT. PA made aware.

## 2015-05-16 NOTE — ED Notes (Signed)
Bed: [id number] Expected date:  Expected time:  Means of arrival:  Comments: EMS 70 yo female fall down 3 stairs-pain right knee

## 2015-05-16 NOTE — ED Notes (Signed)
Patient transported to CT 

## 2015-05-16 NOTE — H&P (Signed)
FLORNCE [name] is an 70 y.o. female.   Chief Complaint: Right knee pain HPI: [place] this AM  Past Medical History  Diagnosis Date  . Difficult intubation 02-13-11    with surgery -multiple times  . Glaucoma 02-13-11    bil. tx. eye drops daily  . Sleep apnea 02-13-11    Hx. sleep apnea-uses cpap since 10'12  . Heart murmur 02-13-11    was told-no issues with this  . Sinus drainage 02-13-11    uses Zyrtec as needed  . Hypothyroidism 02-13-11    tx. with Levothyroxine  . GERD (gastroesophageal reflux disease) 02-13-11    tx. Omeprazole  . Headache(784.0) 02-13-11    past hx. migraines, none recent  . Arthritis 02-13-11    osteoarthritis, hips, knees,s/p Cervical fusion(DDD)  . Hypertension 02-13-11    tx. Atenolol  . Hypercholesterolemia     Past Surgical History  Procedure Laterality Date  . Abdominal hysterectomy  02-13-11  . Joint replacement  02-13-11    RTHA-a yr ago in [place]  . Cervical fusion  02-13-11    '92- retained hardware  . Cholecystectomy  02-13-11    '98-lap. galbladder removal due to stones  . Tubal ligation  02-13-11  . Appendectomy  02-13-11    Lap. removal '92  . Tonsillectomy  02-13-11    child  . Gum surgery  02-13-11    "small mouth"  . Total hip arthroplasty  02/17/2011    Procedure: TOTAL HIP ARTHROPLASTY ANTERIOR APPROACH;  Surgeon: Mauri Pole, MD;  Location: [facility];  Service: Orthopedics;  Laterality: Left;    Family History  Problem Relation Age of Onset  . Cancer Mother     lung   Social History:  reports that she has never smoked. She does not have any smokeless tobacco history on file. She reports that she drinks alcohol. She reports that she does not use illicit drugs.  Allergies:  Allergies  Allergen Reactions  . Ceftin [Cefuroxime Axetil] Hives and Swelling    Swelling of face  . Codeine Nausea Only  . Compazine [Prochlorperazine] Swelling and Other (See Comments)    Face and tongue swelling     (Not in a hospital admission)  Results for orders  placed or performed during the hospital encounter of 05/16/15 (from the past 48 hour(s))  CBC     Status: Abnormal   Collection Time: 05/16/15 10:57 AM  Result Value Ref Range   WBC 15.5 (H) 4.0 - 10.5 K/uL   RBC 4.69 3.87 - 5.11 MIL/uL   Hemoglobin 14.1 12.0 - 15.0 g/dL   HCT 42.8 36.0 - 46.0 %   MCV 91.3 78.0 - 100.0 fL   MCH 30.1 26.0 - 34.0 pg   MCHC 32.9 30.0 - 36.0 g/dL   RDW 13.7 11.5 - 15.5 %   Platelets 209 150 - 400 K/uL  Basic metabolic panel     Status: Abnormal   Collection Time: 05/16/15 10:57 AM  Result Value Ref Range   Sodium 138 135 - 145 mmol/L   Potassium 3.9 3.5 - 5.1 mmol/L   Chloride 100 (L) 101 - 111 mmol/L   CO2 26 22 - 32 mmol/L   Glucose, Bld 136 (H) 65 - 99 mg/dL   BUN 23 (H) 6 - 20 mg/dL   Creatinine, Ser 0.81 0.44 - 1.00 mg/dL   Calcium 8.8 (L) 8.9 - 10.3 mg/dL   GFR calc non Af Amer >60 >60 mL/min   GFR calc Af Amer >  60 >60 mL/min    Comment: (NOTE) The eGFR has been calculated using the CKD EPI equation. This calculation has not been validated in all clinical situations. eGFR's persistently <60 mL/min signify possible Chronic Kidney Disease.    Anion gap 12 5 - 15   Ct Knee Right Wo Contrast  05/16/2015  CLINICAL DATA:  [place] today and injured right knee. Pain and swelling. Evaluate lateral tibial plateau fracture. EXAM: CT OF THE right KNEE WITHOUT CONTRAST TECHNIQUE: Multidetector CT imaging of the right knee was performed according to the standard protocol. Multiplanar CT image reconstructions were also generated. COMPARISON:  Radiographs 05/16/2015 FINDINGS: There are vertical/longitudinal fractures involving the lateral tibial plateau without significant displacement. There is minimal depression involving the posterior aspect of the tibial plateau estimated at 3 mm. The medial tibial plateau is intact. No femur, fibula or patella fracture. Associated large lipohemarthrosis. The quadriceps pelvic tendons are intact. No obvious ligamentous injury.  IMPRESSION: Longitudinal/vertical fractures involving the lateral tibial plateau with minimal depression posteriorly estimated at 3 mm. No other significant bony findings. Associated large lipohemarthrosis. Grossly intact ligamentous structures. Electronically Signed   By: Marijo Sanes M.D.   On: 05/16/2015 09:06   Dg Knee Complete 4 Views Right  05/16/2015  CLINICAL DATA:  Fall.  Initial evaluation. EXAM: RIGHT KNEE - COMPLETE 4+ VIEW COMPARISON:  11/12/2014. FINDINGS: Moderate knee joint effusion noted. Slight depressed fracture of the lateral tibial plateau appears to be present. Linear lucency noted Reading longitudinally in the upper tibia on oblique view only is most likely related overlying fat plane. Diffuse degenerative change present. IMPRESSION: 1. Slightly displaced fracture of the lateral tibial plateau. Moderate knee joint effusion. 2. Tricompartment degenerative change. Electronically Signed   By: Marcello Moores  Register   On: 05/16/2015 07:51    Review of Systems  Musculoskeletal: Positive for joint pain.  All other systems reviewed and are negative.   Blood pressure 104/49, pulse 81, temperature 98.1 F (36.7 C), temperature source Oral, resp. rate 18, SpO2 96 %. Physical Exam  Vitals reviewed. Constitutional: She is oriented to person, place, and time. She appears well-developed.  HENT:  Head: Normocephalic.  Eyes: Pupils are equal, round, and reactive to light.  Neck: Normal range of motion.  Cardiovascular: Normal rate.   Respiratory: Effort normal.  GI: Soft.  Musculoskeletal:  Right knee pain with palpation. Compartments soft. Neurovascularly intact.   Neurological: She is alert and oriented to person, place, and time.  Skin: Skin is warm and dry.  Psychiatric: She has a normal mood and affect.     Assessment/Plan Minimally displaced tibial plateau fracture. Ambulatory disfunction. Plan conservative. NWB. Admit. D/C disposition. Discussed with  patient.  Danielle Hai, MD 05/16/2015, 11:52 AM

## 2015-05-16 NOTE — ED Notes (Signed)
Pt can go to floor at 12:38

## 2015-05-16 NOTE — ED Notes (Signed)
Pt arrives to the ER via EMS after falling down approx 3 stairs this am; pt states that she just slipped and lost her footing; pt c/o rt knee pain; pt states that her leg went behind her and out to the side; pt c/o pain to the outer knee area; pt denies LOC; pt denies neck or back pain; pt c/o nausea en route to the ER and was given 4mg  Zofran IV; pt denies nausea currently; pt refused pain medications en route due to making her nauseous

## 2015-05-16 NOTE — Care Management Note (Signed)
Case Management Note  Patient Details  Name: Danielle [name] MRN: [id number] Date of Birth: 10-04-1945  Subjective/Objective:         Right knee injury pt states She informed CM after reviewing home health list for [facility] that she believes [place] provided services to her after past surgeries and her choice would be to have them follow her for d/c needs if they can verify they are her previous agency            Action/Plan: CM reviewed in details medicare guidelines, home health [facility]) (length of stay in home, types of [facility] staff available, coverage, primary caregiver, up to 24 hrs before services may be started) and Private duty nursing (PDN-coverage, length of stay in the home types of staff available). CM reviewed availability of [facility] to assist pcp to get pt to snf (if desired disposition) from the community level. CM provided pt/family with a list of [place] home health agencies and PDN.   CM provided pt with pt belonging bag to place MOON and magazines in Pt provided with 2 ice packs per her request and assisted with raising side rails to re position her in bed  Sent message to [facility] to check to see if they provided pt previous services and to follow for d/c needs Tim replied pt to be followed for d/c needs    Expected Discharge Date:   (unknown)               Expected Discharge Plan:     In-House Referral:   na  Discharge planning Services   home health   Post Acute Care Choice:   home health  Choice offered to:   pt  DME Arranged:    DME Agency:   [facility]   HH Arranged:    [place] Agency:   [facility]  Status of Service:   completed      Additional Comments:  Robbie Lis, RN 05/16/2015, 12:30 PM

## 2015-05-17 DIAGNOSIS — W010XXA Fall on same level from slipping, tripping and stumbling without subsequent striking against object, initial encounter: Secondary | ICD-10-CM | POA: Diagnosis not present

## 2015-05-17 DIAGNOSIS — S82141A Displaced bicondylar fracture of right tibia, initial encounter for closed fracture: Secondary | ICD-10-CM | POA: Diagnosis not present

## 2015-05-17 DIAGNOSIS — S82121A Displaced fracture of lateral condyle of right tibia, initial encounter for closed fracture: Secondary | ICD-10-CM | POA: Diagnosis not present

## 2015-05-17 MED ORDER — METOCLOPRAMIDE HCL 5 MG/ML IJ SOLN
5.0000 mg | Freq: Four times a day (QID) | INTRAMUSCULAR | Status: DC | PRN
  Administered 2015-05-17: 5 mg via INTRAVENOUS
  Administered 2015-05-17: 10 mg via INTRAVENOUS
  Filled 2015-05-17 (×2): qty 2

## 2015-05-17 MED ORDER — DOCUSATE SODIUM 100 MG PO CAPS: 100.0000 mg | ORAL_CAPSULE | Freq: Two times a day (BID) | ORAL | Status: DC

## 2015-05-17 MED ORDER — SODIUM CHLORIDE 0.9 % IV SOLN
8.0000 mg | Freq: Four times a day (QID) | INTRAVENOUS | Status: DC | PRN
  Filled 2015-05-17: qty 4

## 2015-05-17 MED ORDER — OXYCODONE-ACETAMINOPHEN 5-325 MG PO TABS: 1.0000 | ORAL_TABLET | Freq: Four times a day (QID) | ORAL | Status: DC | PRN

## 2015-05-17 NOTE — Clinical Social Work Placement (Signed)
   CLINICAL SOCIAL WORK PLACEMENT  NOTE  Date:  05/17/2015  Patient Details  Name: Danielle [name] MRN: [id number] Date of Birth: 05-Jul-1945  Clinical Social Work is seeking post-discharge placement for this patient at the [place] level of care (*CSW will initial, date and re-position this form in  chart as items are completed):      Patient/family provided with [place] Work Department's list of facilities offering this level of care within the geographic area requested by the patient (or if unable, by the patient's family).      Patient/family informed of their freedom to choose among providers that offer the needed level of care, that participate in Medicare, Medicaid or managed care program needed by the patient, have an available bed and are willing to accept the patient.      Patient/family informed of [facility]'s ownership interest in [facility] and [facility], as well as of the fact that they are under no obligation to receive care at these facilities.  PASRR submitted to EDS on 05/17/15     PASRR number received on 05/17/15     Existing PASRR number confirmed on       FL2 transmitted to all facilities in geographic area requested by pt/family on 05/17/15     FL2 transmitted to all facilities within larger geographic area on       Patient informed that his/her managed care company has contracts with or will negotiate with certain facilities, including the following:            Patient/family informed of bed offers received.  Patient chooses bed at       Physician recommends and patient chooses bed at      Patient to be transferred to   on  .  Patient to be transferred to facility by       Patient family notified on   of transfer.  Name of family member notified:        PHYSICIAN Please sign FL2     Additional Comment:    _______________________________________________ Drema Balzarine D, LCSW 05/17/2015,  2:07 PM

## 2015-05-17 NOTE — NC FL2 (Deleted)
[place] LEVEL OF CARE SCREENING TOOL     IDENTIFICATION  Patient Name: Danielle [name] Birthdate: December 22, 1945 Sex: female Admission Date (Current Location): 05/16/2015  [facility] and [place] Number:  Herbalist and Address:  [facility],  [place] [address], [place]      Provider Number: [id number]  Attending Physician Name and Address:  Susa Day, MD  Relative Name and Phone Number:       Current Level of Care: Hospital Recommended Level of Care: [place] Prior Approval Number:    Date Approved/Denied:   PASRR Number:    Discharge Plan: SNF    Current Diagnoses: Patient Active Problem List   Diagnosis Date Noted  . Tibial plateau fracture 05/16/2015  . Closed tibial fracture 05/16/2015  . Sebaceous cyst of breast 07/27/2013  . 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    Orientation RESPIRATION BLADDER Height & Weight     Self, Time, Situation, Place  Normal, Other (Comment) (CPAP at bedtime) Incontinent Weight: 180 lb (81.647 kg) Height:  5\' 4"  (162.6 cm)  BEHAVIORAL SYMPTOMS/MOOD NEUROLOGICAL BOWEL NUTRITION STATUS      Continent    AMBULATORY STATUS COMMUNICATION OF NEEDS Skin   Limited Assist Verbally Normal                       Personal Care Assistance Level of Assistance  Bathing, Feeding, Dressing Bathing Assistance: Limited assistance Feeding assistance: Independent Dressing Assistance: Limited assistance     Functional Limitations Info  Sight, Hearing, Speech Sight Info: Adequate Hearing Info: Adequate Speech Info: Adequate    SPECIAL CARE FACTORS FREQUENCY   PT                    Contractures      Additional Factors Info   Allergies: Ceftin, Codeine, Compazine               Current Medications (05/17/2015):  This is the current hospital active medication list Current Facility-Administered Medications  Medication Dose Route Frequency Provider Last Rate Last  Dose  . 0.9 %  sodium chloride infusion  250 mL Intravenous PRN Susa Day, MD      . atenolol (TENORMIN) tablet 25 mg  25 mg Oral Daily Susa Day, MD   25 mg at 05/17/15 1020  . bisacodyl (DULCOLAX) EC tablet 5 mg  5 mg Oral Daily PRN Susa Day, MD      . calcium-vitamin D (OSCAL WITH D) 500-200 MG-UNIT per tablet 1 tablet  1 tablet Oral BID Susa Day, MD   1 tablet at 05/17/15 1020  . cyanocobalamin tablet 500 mcg  500 mcg Oral Daily Susa Day, MD   500 mcg at 05/17/15 1020  . docusate sodium (COLACE) capsule 100 mg  100 mg Oral BID Susa Day, MD   100 mg at 05/17/15 1019  . DULoxetine (CYMBALTA) DR capsule 60 mg  60 mg Oral Daily Susa Day, MD   60 mg at 05/17/15 1020  . enoxaparin (LOVENOX) injection 40 mg  40 mg Subcutaneous Q24H Susa Day, MD   40 mg at 05/17/15 [id number]  . hydrochlorothiazide (HYDRODIURIL) tablet 25 mg  25 mg Oral Daily Susa Day, MD   25 mg at 05/17/15 1020  . HYDROmorphone (DILAUDID) injection 0.5 mg  0.5 mg Intravenous Q3H PRN Susa Day, MD   0.5 mg at 05/17/15 0823  . latanoprost (XALATAN) 0.005 % ophthalmic solution 1 drop  1  drop Both Eyes QHS Susa Day, MD   1 drop at 05/16/15 2203  . levothyroxine (SYNTHROID, LEVOTHROID) tablet 112 mcg  112 mcg Oral QAC breakfast Susa Day, MD   112 mcg at 05/17/15 [phone]  . loratadine (CLARITIN) tablet 10 mg  10 mg Oral Daily Susa Day, MD   10 mg at 05/17/15 1020  . magnesium citrate solution 1 Bottle  1 Bottle Oral Once PRN Susa Day, MD      . metoCLOPramide (REGLAN) injection 5-10 mg  5-10 mg Intravenous Q6H PRN Cecilie Kicks, PA-C   10 mg at 05/17/15 [id number]  . ondansetron (ZOFRAN) 8 mg in sodium chloride 0.9 % 50 mL IVPB  8 mg Intravenous Q6H PRN Cecilie Kicks, PA-C      . oxyCODONE (Oxy IR/ROXICODONE) immediate release tablet 5 mg  5 mg Oral Q4H PRN Susa Day, MD   5 mg at 05/17/15 1226  . pantoprazole (PROTONIX) EC tablet 40 mg  40 mg Oral Daily Susa Day, MD   40  mg at 05/17/15 1020  . rosuvastatin (CRESTOR) tablet 20 mg  20 mg Oral Daily Susa Day, MD   20 mg at 05/17/15 1019  . senna-docusate (Senokot-S) tablet 1 tablet  1 tablet Oral QHS PRN Susa Day, MD      . sodium chloride flush (NS) 0.9 % injection 3 mL  3 mL Intravenous Q12H Susa Day, MD   3 mL at 05/17/15 1024  . sodium chloride flush (NS) 0.9 % injection 3 mL  3 mL Intravenous PRN Susa Day, MD         Discharge Medications: Please see discharge summary for a list of discharge medications.  Relevant Imaging Results:  Relevant Lab Results:   Additional Information  [id number]  **Patient would like to private pay one week of SNF. Pt will likely be ready for discharge on Saturday.  [place], [place], [place]

## 2015-05-17 NOTE — Progress Notes (Signed)
CSW met with pt to confirm d/c plan. Pt reports that she will go home at d/c. Pt declined paying out of pocket for ST Rehab. RNCM will assist with d/c / planning.  Werner Lean LCSW [phone]

## 2015-05-17 NOTE — Evaluation (Signed)
Physical Therapy Evaluation Patient Details Name: Danielle [name] MRN: [id number] DOB: 1945-06-19 Today's Date: 05/17/2015   History of Present Illness  Pt s/p fall with R tibial plateau fx and with hx of R THR and Cervical fusion  Clinical Impression  Pt with hx of R tib plateau fx and presents with functional mobility limitations 2* pain, nausea and NWB on R LE.  Pt plans dc home and would benefit from use of short-term WC and follow up HHPT.    Follow Up Recommendations Home health PT    Equipment Recommendations  Wheelchair (measurements PT) (with elevating leg rests)    Recommendations for Other Services       Precautions / Restrictions Precautions Precautions: Fall Required Braces or Orthoses: Knee Immobilizer - Right Knee Immobilizer - Right: Other (comment) (No specific orders listed) Restrictions Weight Bearing Restrictions: Yes RLE Weight Bearing: Non weight bearing      Mobility  Bed Mobility Overal bed mobility: Needs Assistance Bed Mobility: Supine to Sit;Sit to Supine     Supine to sit: Min assist Sit to supine: Min assist   General bed mobility comments: cues for sequence and use of L LE to self assist.  Min assist to manage R LE  Transfers Overall transfer level: Needs assistance Equipment used: Rolling walker (2 wheeled) Transfers: Sit to/from Stand Sit to Stand: Min assist         General transfer comment: cues for LE management and use of UEs to self assist  Ambulation/Gait             General Gait Details: Pt stood only at EOB but returned to sitting with c/o overwhelming nausea.  Pt maintained NWB on R while standing  Stairs            Wheelchair Mobility    Modified Rankin (Stroke Patients Only)       Balance Overall balance assessment: Needs assistance Sitting-balance support: Feet supported;No upper extremity supported Sitting balance-Leahy Scale: Good     Standing balance support: Bilateral upper extremity  supported Standing balance-Leahy Scale: Poor                               Pertinent Vitals/Pain Pain Assessment: 0-10 Pain Score: 3  Pain Location: R knee Pain Descriptors / Indicators: Aching Pain Intervention(s): Limited activity within patient's tolerance;Monitored during session;Premedicated before session;Ice applied    Home Living Family/patient expects to be discharged to:: Private residence Living Arrangements: Spouse/significant other Available Help at Discharge: Family Type of Home: House Home Access: Stairs to enter   Technical brewer of Steps: 1 Home Layout: Able to live on main level with bedroom/bathroom Home Equipment: Walker - 2 wheels;Bedside commode      Prior Function Level of Independence: Independent               Hand Dominance        Extremity/Trunk Assessment   Upper Extremity Assessment: Overall WFL for tasks assessed           Lower Extremity Assessment: RLE deficits/detail      Cervical / Trunk Assessment: Normal  Communication   Communication: No difficulties  Cognition Arousal/Alertness: Awake/alert Behavior During Therapy: WFL for tasks assessed/performed Overall Cognitive Status: Within Functional Limits for tasks assessed                      General Comments      Exercises General Exercises -  Lower Extremity Ankle Circles/Pumps: AROM;15 reps;Supine      Assessment/Plan    PT Assessment Patient needs continued PT services  PT Diagnosis Difficulty walking   PT Problem List Decreased strength;Decreased range of motion;Decreased activity tolerance;Decreased balance;Decreased mobility;Decreased knowledge of use of DME;Pain;Obesity;Decreased knowledge of precautions  PT Treatment Interventions DME instruction;Gait training;Stair training;Functional mobility training;Therapeutic activities;Therapeutic exercise;Balance training;Patient/family education   PT Goals (Current goals can be found  in the Care Plan section) Acute Rehab PT Goals Patient Stated Goal: Regain IND PT Goal Formulation: With patient Time For Goal Achievement: 05/20/15 Potential to Achieve Goals: Fair    Frequency Min 3X/week   Barriers to discharge        Co-evaluation               End of Session Equipment Utilized During Treatment: Gait belt;Right knee immobilizer Activity Tolerance: Other (comment) (ltd by nausea) Patient left: in bed;with call bell/phone within reach Nurse Communication: Mobility status;Other (comment) (nausea)    Functional Assessment Tool Used: Clinical judgement Functional Limitation: Mobility: Walking and moving around Mobility: Walking and Moving Around Current Status [phone]): At least 20 percent but less than 40 percent impaired, limited or restricted Mobility: Walking and Moving Around Goal Status [phone]): At least 1 percent but less than 20 percent impaired, limited or restricted    Time: 1058-1130 PT Time Calculation (min) (ACUTE ONLY): 32 min   Charges:   PT Evaluation $PT Eval Low Complexity: 1 Procedure     PT G Codes:   PT G-Codes **NOT FOR INPATIENT CLASS** Functional Assessment Tool Used: Clinical judgement Functional Limitation: Mobility: Walking and moving around Mobility: Walking and Moving Around Current Status [id number]): At least 20 percent but less than 40 percent impaired, limited or restricted Mobility: Walking and Moving Around Goal Status [phone]): At least 1 percent but less than 20 percent impaired, limited or restricted    Jamere Stidham 05/17/2015, 3:13 PM

## 2015-05-17 NOTE — Progress Notes (Signed)
[place] is providing the following services: Wheelchair (to be delivered to patient's hospital room)  If patient discharges after hours, please call [phone].   Linward Headland 05/17/2015, 3:39 PM

## 2015-05-17 NOTE — Progress Notes (Signed)
CSW consulted for possible SNF placement.  PT recommendations are pending. Pt has been admitted under Observation status. CSW met with pt to explain that her insurance will not cover SNF placement due to Observation status. CSW offered to assist with pvt pay placement if PT recommends SNF. Pt declined placement if insurance will not cover cost. CSW will remain available to assist with d/c planning in case pt changes her mind in the event PT does recommend ST Rehab placement.  Werner Lean LCSW [phone]

## 2015-05-17 NOTE — NC FL2 (Signed)
[place] LEVEL OF CARE SCREENING TOOL     IDENTIFICATION  Patient Name: Danielle [name] Birthdate: 12/30/1945 Sex: female Admission Date (Current Location): 05/16/2015  [facility] and [place] Number:  Herbalist and Address:  [facility],  [place], [place]      Provider Number: [id number]  Attending Physician Name and Address:  Susa Day, MD  Relative Name and Phone Number:       Current Level of Care: Hospital Recommended Level of Care: [place] Prior Approval Number:    Date Approved/Denied:   PASRR Number:   [id number]   Discharge Plan: SNF    Current Diagnoses: Patient Active Problem List   Diagnosis Date Noted  . Tibial plateau fracture 05/16/2015  . Closed tibial fracture 05/16/2015  . Sebaceous cyst of breast 07/27/2013  . 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    Orientation RESPIRATION BLADDER Height & Weight     Self, Time, Situation, Place  Normal, Other (Comment) (CPAP at bedtime) Incontinent Weight: 180 lb (81.647 kg) Height:  5\' 4"  (162.6 cm)  BEHAVIORAL SYMPTOMS/MOOD NEUROLOGICAL BOWEL NUTRITION STATUS      Continent    AMBULATORY STATUS COMMUNICATION OF NEEDS Skin   Limited Assist Verbally Normal                       Personal Care Assistance Level of Assistance  Bathing, Feeding, Dressing Bathing Assistance: Limited assistance Feeding assistance: Independent Dressing Assistance: Limited assistance     Functional Limitations Info  Sight, Hearing, Speech Sight Info: Adequate Hearing Info: Adequate Speech Info: Adequate    SPECIAL CARE FACTORS FREQUENCY                       Contractures      Additional Factors Info  Allergies   Allergies Info: Ceftin, Codeine, Compazine           Current Medications (05/17/2015):  This is the current hospital active medication list Current Facility-Administered Medications  Medication Dose Route Frequency  Provider Last Rate Last Dose  . 0.9 %  sodium chloride infusion  250 mL Intravenous PRN Susa Day, MD      . atenolol (TENORMIN) tablet 25 mg  25 mg Oral Daily Susa Day, MD   25 mg at 05/17/15 1020  . bisacodyl (DULCOLAX) EC tablet 5 mg  5 mg Oral Daily PRN Susa Day, MD      . calcium-vitamin D (OSCAL WITH D) 500-200 MG-UNIT per tablet 1 tablet  1 tablet Oral BID Susa Day, MD   1 tablet at 05/17/15 1020  . cyanocobalamin tablet 500 mcg  500 mcg Oral Daily Susa Day, MD   500 mcg at 05/17/15 1020  . docusate sodium (COLACE) capsule 100 mg  100 mg Oral BID Susa Day, MD   100 mg at 05/17/15 1019  . DULoxetine (CYMBALTA) DR capsule 60 mg  60 mg Oral Daily Susa Day, MD   60 mg at 05/17/15 1020  . enoxaparin (LOVENOX) injection 40 mg  40 mg Subcutaneous Q24H Susa Day, MD   40 mg at 05/17/15 [id number]  . hydrochlorothiazide (HYDRODIURIL) tablet 25 mg  25 mg Oral Daily Susa Day, MD   25 mg at 05/17/15 1020  . HYDROmorphone (DILAUDID) injection 0.5 mg  0.5 mg Intravenous Q3H PRN Susa Day, MD   0.5 mg at 05/17/15 0823  . latanoprost (XALATAN) 0.005 % ophthalmic solution 1 drop  1 drop Both Eyes QHS Susa Day, MD   1 drop at 05/16/15 2203  . levothyroxine (SYNTHROID, LEVOTHROID) tablet 112 mcg  112 mcg Oral QAC breakfast Susa Day, MD   112 mcg at 05/17/15 [phone]  . loratadine (CLARITIN) tablet 10 mg  10 mg Oral Daily Susa Day, MD   10 mg at 05/17/15 1020  . magnesium citrate solution 1 Bottle  1 Bottle Oral Once PRN Susa Day, MD      . metoCLOPramide (REGLAN) injection 5-10 mg  5-10 mg Intravenous Q6H PRN Cecilie Kicks, PA-C   10 mg at 05/17/15 [id number]  . ondansetron (ZOFRAN) 8 mg in sodium chloride 0.9 % 50 mL IVPB  8 mg Intravenous Q6H PRN Cecilie Kicks, PA-C      . oxyCODONE (Oxy IR/ROXICODONE) immediate release tablet 5 mg  5 mg Oral Q4H PRN Susa Day, MD   5 mg at 05/17/15 1226  . pantoprazole (PROTONIX) EC tablet 40 mg  40 mg Oral Daily  Susa Day, MD   40 mg at 05/17/15 1020  . rosuvastatin (CRESTOR) tablet 20 mg  20 mg Oral Daily Susa Day, MD   20 mg at 05/17/15 1019  . senna-docusate (Senokot-S) tablet 1 tablet  1 tablet Oral QHS PRN Susa Day, MD      . sodium chloride flush (NS) 0.9 % injection 3 mL  3 mL Intravenous Q12H Susa Day, MD   3 mL at 05/17/15 1024  . sodium chloride flush (NS) 0.9 % injection 3 mL  3 mL Intravenous PRN Susa Day, MD         Discharge Medications: Please see discharge summary for a list of discharge medications.  Relevant Imaging Results:  Relevant Lab Results:   Additional Information SSN: [id number]  [place], [place]

## 2015-05-17 NOTE — Discharge Summary (Signed)
Patient ID: Danielle [name] MRN: [id number] DOB/AGE: 1945/02/22 70 y.o.  Admit date: 05/16/2015 Discharge date: 05/17/2015  Admission Diagnoses:  Active Problems:   Tibial plateau fracture   Closed tibial fracture   Discharge Diagnoses:  Same  Past Medical History  Diagnosis Date  . Difficult intubation 02-13-11    with surgery -multiple times  . Glaucoma 02-13-11    bil. tx. eye drops daily  . Sleep apnea 02-13-11    Hx. sleep apnea-uses cpap since 10'12  . Heart murmur 02-13-11    was told-no issues with this  . Sinus drainage 02-13-11    uses Zyrtec as needed  . Hypothyroidism 02-13-11    tx. with Levothyroxine  . GERD (gastroesophageal reflux disease) 02-13-11    tx. Omeprazole  . Headache(784.0) 02-13-11    past hx. migraines, none recent  . Arthritis 02-13-11    osteoarthritis, hips, knees,s/p Cervical fusion(DDD)  . Hypertension 02-13-11    tx. Atenolol  . Hypercholesterolemia     Surgeries:  on * No surgery found *   Consultants: Treatment Team:  Susa Day, MD  Discharged Condition: Improved  Hospital Course: Danielle [name] is an 70 y.o. female who was admitted 05/16/2015 for non-operative treatment of right lateral tibial plateau fracture Patient has severe unremitting pain that affects sleep, daily activities, and work/hobbies.   Patient was given sequential compression devices, early ambulation, and chemoprophylaxis to prevent DVT.  Patient benefited maximally from hospital stay and there were no complications.    Recent vital signs: Patient Vitals for the past 24 hrs:  BP Temp Temp src Pulse Resp SpO2 Height Weight  05/17/15 1420 (!) 119/56 mmHg 99.2 F (37.3 C) Oral 94 18 96 % - -  05/17/15 0529 116/73 mmHg 99 F (37.2 C) Oral (!) 104 18 90 % - -  05/17/15 0300 - - - - - - 5\' 4"  (1.626 m) 81.647 kg (180 lb)  05/16/15 2227 - - - - - 90 % - -  05/16/15 2223 131/82 mmHg 98.6 F (37 C) Oral (!) 112 18 (!) 85 % - -     Recent laboratory studies:  Recent  Labs  05/16/15 1057  WBC 15.5*  HGB 14.1  HCT 42.8  PLT 209  NA 138  K 3.9  CL 100*  CO2 26  BUN 23*  CREATININE 0.81  GLUCOSE 136*  CALCIUM 8.8*     Discharge Medications:     Medication List    TAKE these medications        atenolol 25 MG tablet  Commonly known as:  TENORMIN  Take 25 mg by mouth every morning.     calcium-vitamin D 500-200 MG-UNIT tablet  Commonly known as:  OSCAL WITH D  Take 1 tablet by mouth 2 (two) times daily.     cetirizine 10 MG tablet  Commonly known as:  ZYRTEC  Take 10 mg by mouth daily as needed for allergies.     docusate sodium 100 MG capsule  Commonly known as:  COLACE  Take 1 capsule (100 mg total) by mouth 2 (two) times daily.     DULoxetine 60 MG capsule  Commonly known as:  CYMBALTA  Take 60 mg by mouth daily.     hydrochlorothiazide 25 MG tablet  Commonly known as:  HYDRODIURIL  Take 25 mg by mouth daily.     omeprazole 20 MG capsule  Commonly known as:  PRILOSEC  Take 20 mg by mouth daily as needed (for heartburn).  oxyCODONE-acetaminophen 5-325 MG tablet  Commonly known as:  ROXICET  Take 1 tablet by mouth every 6 (six) hours as needed for severe pain.     rosuvastatin 20 MG tablet  Commonly known as:  CRESTOR  Take 20 mg by mouth daily.     TIROSINT 112 MCG Caps  Generic drug:  Levothyroxine Sodium  Take 112 mcg by mouth daily before breakfast.     travoprost (benzalkonium) 0.004 % ophthalmic solution  Commonly known as:  TRAVATAN  Place 1 drop into both eyes at bedtime.     vitamin B-12 500 MCG tablet  Commonly known as:  CYANOCOBALAMIN  Take 500 mcg by mouth daily. Reported on 05/16/2015        Diagnostic Studies: Ct Knee Right Wo Contrast  05/16/2015  CLINICAL DATA:  [place] today and injured right knee. Pain and swelling. Evaluate lateral tibial plateau fracture. EXAM: CT OF THE right KNEE WITHOUT CONTRAST TECHNIQUE: Multidetector CT imaging of the right knee was performed according to the standard  protocol. Multiplanar CT image reconstructions were also generated. COMPARISON:  Radiographs 05/16/2015 FINDINGS: There are vertical/longitudinal fractures involving the lateral tibial plateau without significant displacement. There is minimal depression involving the posterior aspect of the tibial plateau estimated at 3 mm. The medial tibial plateau is intact. No femur, fibula or patella fracture. Associated large lipohemarthrosis. The quadriceps pelvic tendons are intact. No obvious ligamentous injury. IMPRESSION: Longitudinal/vertical fractures involving the lateral tibial plateau with minimal depression posteriorly estimated at 3 mm. No other significant bony findings. Associated large lipohemarthrosis. Grossly intact ligamentous structures. Electronically Signed   By: Marijo Sanes M.D.   On: 05/16/2015 09:06   Dg Knee Complete 4 Views Right  05/16/2015  CLINICAL DATA:  Fall.  Initial evaluation. EXAM: RIGHT KNEE - COMPLETE 4+ VIEW COMPARISON:  11/12/2014. FINDINGS: Moderate knee joint effusion noted. Slight depressed fracture of the lateral tibial plateau appears to be present. Linear lucency noted Reading longitudinally in the upper tibia on oblique view only is most likely related overlying fat plane. Diffuse degenerative change present. IMPRESSION: 1. Slightly displaced fracture of the lateral tibial plateau. Moderate knee joint effusion. 2. Tricompartment degenerative change. Electronically Signed   By: Marcello Moores  Register   On: 05/16/2015 07:51    Disposition: 01-Home or Self Care vs. SNF      Discharge Instructions    Non weight bearing    Complete by:  As directed   Laterality:  right  Extremity:  Lower           Follow-up Information    Follow up with [facility].   Why:  Home Health Physical Therapy   Contact information:   [address] [place] [address] [phone]        Signed: Cecilie Kicks. 05/17/2015, 2:48 PM

## 2015-05-17 NOTE — Care Management Note (Signed)
Case Management Note  Patient Details  Name: Danielle [name] MRN: [id number] Date of Birth: 03/17/1945  Subjective/Objective:     R minimally displaced tibial plateau fx               Action/Plan: Discharge Planning:  NCM spoke to pt and friend, Ed Engineer, technical sales at bedside. And offered choice for [facility]. [facility] was arranged by previous NCM. Pt agreeable to [facility] for [facility] PT. Pt states she has RW at home. Requesting light weight manual wheelchair for home. Contacted [facility] DME rep for wheelchair. NCM contacted [employer] to check rehab benefits, per [employer] pt would need 3 night IP qualifying stay for them to cover rehab. Pt states she is willing to see what cost will be for a one week stay at rehab and she pay out of pocket. Contacted CSW with updates.    Expected Discharge Date:  05/18/2015            Expected Discharge Plan:  [place]  In-House Referral:  Clinical Social Work  Discharge planning Services  CM Consult  Post Acute Care Choice:  Home Health Choice offered to:  Patient  DME Arranged:  Youth worker wheelchair with seat cushion DME Agency:  [place]:  PT [place] Agency:  [place]  Status of Service:  Completed, signed off  Medicare Important Message Given:    Date Medicare IM Given:    Medicare IM give by:    Date Additional Medicare IM Given:    Additional Medicare Important Message give by:     If discussed at [place] of Stay Meetings, dates discussed:    Additional Comments:  Erenest Rasher, RN 05/17/2015, 1:20 PM

## 2015-05-17 NOTE — Care Management (Signed)
Required Medicare progress note for Wheelchair:  Patient suffers from Right minimally displaced tibial plateau fracture which impairs their ability to perform daily activities like toileting, walking in the home. A walker or cane will not resolve  issue with performing activities of daily living. A wheelchair will allow patient to safely perform daily activities.  Patient self-propels the wheelchair while engaging in frequent activities such as meals which cannot be performed in a standard or lightweight wheelchair due to the weight of the chair. Accessories: elevating leg rests (ELRs), wheel locks, extensions and anti-tippers. Needs leg extention for right leg, seat cushion   Jonnie Finner RN CCM Case Mgmt phone [phone]

## 2015-05-17 NOTE — Progress Notes (Addendum)
Subjective: R minimally displaced tibial plateau fx Pt was unable to be D/C'd home from ER Reports she couldn't tolerate knee immobilizer due to pain Dilaudid caused nausea. Hasn't had pain meds since 8pm last night noting nausea this AM. Has not eaten yet. Has not been OOB yet. Reports her bedroom is upstairs at home but has option to sleep on sofa.  Objective: Vital signs in last 24 hours: Temp:  [98.1 F (36.7 C)-99 F (37.2 C)] 99 F (37.2 C) (05/05 0529) Pulse Rate:  [75-112] 104 (05/05 0529) Resp:  [16-18] 18 (05/05 0529) BP: (97-131)/(42-82) 116/73 mmHg (05/05 0529) SpO2:  [85 %-99 %] 90 % (05/05 0529) Weight:  [81.647 kg (180 lb)] 81.647 kg (180 lb) (05/05 0300)  Intake/Output from previous day: 05/04 0701 - 05/05 0700 In: 720 [P.O.:720] Out: 350 [Urine:350] Intake/Output this shift:     Recent Labs  05/16/15 1057  HGB 14.1    Recent Labs  05/16/15 1057  WBC 15.5*  RBC 4.69  HCT 42.8  PLT 209    Recent Labs  05/16/15 1057  NA 138  K 3.9  CL 100*  CO2 26  BUN 23*  CREATININE 0.81  GLUCOSE 136*  CALCIUM 8.8*   No results for input(s): LABPT, INR in the last 72 hours.  Neurologically intact ABD soft Neurovascular intact Sensation intact distally Intact pulses distally Dorsiflexion/Plantar flexion intact No cellulitis present Compartment soft no sign of DVT  Assessment/Plan: Minimally displaced R lateral tibial plateau fx- treating nonsurgically Remain NWB RLE Immobilizer for support RLE as tolerated Ice and elevate to reduce swelling Up with PT today for eval then plan for D/C home with HHPT or to SNF depending on PT recommendations Social work consulted for possible SNF placement Rx on chart Will discuss with Dr. Mliss Fritz, JACLYN M. 05/17/2015, 8:26 AM   No pain with dorsiflexion or plantarflexion of foot or great toe. Compartments soft .NVI. tol no IV. Will get WC

## 2015-05-21 DIAGNOSIS — M170 Bilateral primary osteoarthritis of knee: Secondary | ICD-10-CM | POA: Diagnosis not present

## 2015-05-21 DIAGNOSIS — M519 Unspecified thoracic, thoracolumbar and lumbosacral intervertebral disc disorder: Secondary | ICD-10-CM | POA: Diagnosis not present

## 2015-05-21 DIAGNOSIS — H409 Unspecified glaucoma: Secondary | ICD-10-CM | POA: Diagnosis not present

## 2015-05-21 DIAGNOSIS — M160 Bilateral primary osteoarthritis of hip: Secondary | ICD-10-CM | POA: Diagnosis not present

## 2015-05-21 DIAGNOSIS — W19XXXD Unspecified fall, subsequent encounter: Secondary | ICD-10-CM | POA: Diagnosis not present

## 2015-05-21 DIAGNOSIS — S82141D Displaced bicondylar fracture of right tibia, subsequent encounter for closed fracture with routine healing: Secondary | ICD-10-CM | POA: Diagnosis not present

## 2015-05-24 DIAGNOSIS — F419 Anxiety disorder, unspecified: Secondary | ICD-10-CM | POA: Diagnosis not present

## 2015-05-24 DIAGNOSIS — F329 Major depressive disorder, single episode, unspecified: Secondary | ICD-10-CM | POA: Diagnosis not present

## 2015-05-27 DIAGNOSIS — M160 Bilateral primary osteoarthritis of hip: Secondary | ICD-10-CM | POA: Diagnosis not present

## 2015-05-27 DIAGNOSIS — S82141D Displaced bicondylar fracture of right tibia, subsequent encounter for closed fracture with routine healing: Secondary | ICD-10-CM | POA: Diagnosis not present

## 2015-05-27 DIAGNOSIS — H409 Unspecified glaucoma: Secondary | ICD-10-CM | POA: Diagnosis not present

## 2015-05-27 DIAGNOSIS — M170 Bilateral primary osteoarthritis of knee: Secondary | ICD-10-CM | POA: Diagnosis not present

## 2015-05-27 DIAGNOSIS — M519 Unspecified thoracic, thoracolumbar and lumbosacral intervertebral disc disorder: Secondary | ICD-10-CM | POA: Diagnosis not present

## 2015-05-27 DIAGNOSIS — W19XXXD Unspecified fall, subsequent encounter: Secondary | ICD-10-CM | POA: Diagnosis not present

## 2015-05-30 ENCOUNTER — Encounter (HOSPITAL_COMMUNITY): Payer: Self-pay | Admitting: Emergency Medicine

## 2015-05-30 ENCOUNTER — Observation Stay (HOSPITAL_COMMUNITY): Payer: Medicare Other

## 2015-05-30 ENCOUNTER — Emergency Department (HOSPITAL_COMMUNITY): Payer: Medicare Other

## 2015-05-30 ENCOUNTER — Inpatient Hospital Stay (HOSPITAL_COMMUNITY)
Admission: EM | Admit: 2015-05-30 | Discharge: 2015-06-03 | DRG: 291 | Disposition: A | Discharge status: Home Health | Payer: Medicare Other | Attending: Internal Medicine | Admitting: Internal Medicine

## 2015-05-30 DIAGNOSIS — Z888 Allergy status to other drugs, medicaments and biological substances status: Secondary | ICD-10-CM

## 2015-05-30 DIAGNOSIS — J9601 Acute respiratory failure with hypoxia: Secondary | ICD-10-CM | POA: Diagnosis not present

## 2015-05-30 DIAGNOSIS — W1830XA Fall on same level, unspecified, initial encounter: Secondary | ICD-10-CM | POA: Diagnosis present

## 2015-05-30 DIAGNOSIS — E785 Hyperlipidemia, unspecified: Secondary | ICD-10-CM | POA: Diagnosis present

## 2015-05-30 DIAGNOSIS — F418 Other specified anxiety disorders: Secondary | ICD-10-CM | POA: Diagnosis present

## 2015-05-30 DIAGNOSIS — G4730 Sleep apnea, unspecified: Secondary | ICD-10-CM | POA: Diagnosis present

## 2015-05-30 DIAGNOSIS — J189 Pneumonia, unspecified organism: Secondary | ICD-10-CM | POA: Diagnosis not present

## 2015-05-30 DIAGNOSIS — A419 Sepsis, unspecified organism: Secondary | ICD-10-CM | POA: Diagnosis not present

## 2015-05-30 DIAGNOSIS — I700 Atherosclerosis of aorta: Secondary | ICD-10-CM

## 2015-05-30 DIAGNOSIS — R600 Localized edema: Secondary | ICD-10-CM

## 2015-05-30 DIAGNOSIS — S82141A Displaced bicondylar fracture of right tibia, initial encounter for closed fracture: Secondary | ICD-10-CM | POA: Diagnosis not present

## 2015-05-30 DIAGNOSIS — I110 Hypertensive heart disease with heart failure: Principal | ICD-10-CM | POA: Diagnosis present

## 2015-05-30 DIAGNOSIS — I10 Essential (primary) hypertension: Secondary | ICD-10-CM | POA: Diagnosis not present

## 2015-05-30 DIAGNOSIS — F32A Depression, unspecified: Secondary | ICD-10-CM | POA: Diagnosis present

## 2015-05-30 DIAGNOSIS — R0600 Dyspnea, unspecified: Secondary | ICD-10-CM | POA: Diagnosis present

## 2015-05-30 DIAGNOSIS — Z885 Allergy status to narcotic agent status: Secondary | ICD-10-CM

## 2015-05-30 DIAGNOSIS — R609 Edema, unspecified: Secondary | ICD-10-CM | POA: Diagnosis present

## 2015-05-30 DIAGNOSIS — G4733 Obstructive sleep apnea (adult) (pediatric): Secondary | ICD-10-CM | POA: Diagnosis present

## 2015-05-30 DIAGNOSIS — Z981 Arthrodesis status: Secondary | ICD-10-CM

## 2015-05-30 DIAGNOSIS — F329 Major depressive disorder, single episode, unspecified: Secondary | ICD-10-CM | POA: Diagnosis present

## 2015-05-30 DIAGNOSIS — R0602 Shortness of breath: Secondary | ICD-10-CM | POA: Diagnosis not present

## 2015-05-30 DIAGNOSIS — S82143A Displaced bicondylar fracture of unspecified tibia, initial encounter for closed fracture: Secondary | ICD-10-CM | POA: Diagnosis present

## 2015-05-30 DIAGNOSIS — S82291D Other fracture of shaft of right tibia, subsequent encounter for closed fracture with routine healing: Secondary | ICD-10-CM | POA: Diagnosis not present

## 2015-05-30 DIAGNOSIS — E039 Hypothyroidism, unspecified: Secondary | ICD-10-CM | POA: Diagnosis present

## 2015-05-30 DIAGNOSIS — Z79899 Other long term (current) drug therapy: Secondary | ICD-10-CM

## 2015-05-30 DIAGNOSIS — I5030 Unspecified diastolic (congestive) heart failure: Secondary | ICD-10-CM | POA: Diagnosis not present

## 2015-05-30 DIAGNOSIS — K219 Gastro-esophageal reflux disease without esophagitis: Secondary | ICD-10-CM | POA: Insufficient documentation

## 2015-05-30 DIAGNOSIS — E669 Obesity, unspecified: Secondary | ICD-10-CM | POA: Diagnosis present

## 2015-05-30 DIAGNOSIS — R05 Cough: Secondary | ICD-10-CM | POA: Diagnosis not present

## 2015-05-30 DIAGNOSIS — S82209A Unspecified fracture of shaft of unspecified tibia, initial encounter for closed fracture: Secondary | ICD-10-CM | POA: Diagnosis present

## 2015-05-30 DIAGNOSIS — F419 Anxiety disorder, unspecified: Secondary | ICD-10-CM | POA: Diagnosis present

## 2015-05-30 DIAGNOSIS — Z881 Allergy status to other antibiotic agents status: Secondary | ICD-10-CM

## 2015-05-30 DIAGNOSIS — E7800 Pure hypercholesterolemia, unspecified: Secondary | ICD-10-CM | POA: Diagnosis present

## 2015-05-30 DIAGNOSIS — Z9049 Acquired absence of other specified parts of digestive tract: Secondary | ICD-10-CM

## 2015-05-30 HISTORY — DX: Other specified anxiety disorders: F41.8

## 2015-05-30 HISTORY — DX: Atherosclerosis of aorta: I70.0

## 2015-05-30 LAB — CBC
HCT: 38 % (ref 36.0–46.0)
Hemoglobin: 12.6 g/dL (ref 12.0–15.0)
MCH: 30.4 pg (ref 26.0–34.0)
MCHC: 33.2 g/dL (ref 30.0–36.0)
MCV: 91.6 fL (ref 78.0–100.0)
PLATELETS: 358 10*3/uL (ref 150–400)
RBC: 4.15 MIL/uL (ref 3.87–5.11)
RDW: 13.9 % (ref 11.5–15.5)
WBC: 6.5 10*3/uL (ref 4.0–10.5)

## 2015-05-30 LAB — BASIC METABOLIC PANEL
Anion gap: 8 (ref 5–15)
BUN: 13 mg/dL (ref 6–20)
CALCIUM: 9.4 mg/dL (ref 8.9–10.3)
CHLORIDE: 102 mmol/L (ref 101–111)
CO2: 28 mmol/L (ref 22–32)
CREATININE: 0.91 mg/dL (ref 0.44–1.00)
GFR calc non Af Amer: >60 mL/min (ref >60)
Glucose, Bld: 159 mg/dL — ABNORMAL HIGH (ref 65–99)
Potassium: 4 mmol/L (ref 3.5–5.1)
SODIUM: 138 mmol/L (ref 135–145)

## 2015-05-30 LAB — URINALYSIS, ROUTINE W REFLEX MICROSCOPIC
Bilirubin Urine: NEGATIVE
Glucose, UA: NEGATIVE mg/dL
Hgb urine dipstick: NEGATIVE
Ketones, ur: NEGATIVE mg/dL
NITRITE: NEGATIVE
PROTEIN: NEGATIVE mg/dL
Specific Gravity, Urine: 1.019 (ref 1.005–1.030)
pH: 6 (ref 5.0–8.0)

## 2015-05-30 LAB — TROPONIN I: Troponin I: <0.03 ng/mL (ref <0.031)

## 2015-05-30 LAB — PROTIME-INR
INR: 1.06 (ref 0.00–1.49)
PROTHROMBIN TIME: 13.6 s (ref 11.6–15.2)

## 2015-05-30 LAB — URINE MICROSCOPIC-ADD ON

## 2015-05-30 LAB — BRAIN NATRIURETIC PEPTIDE: B NATRIURETIC PEPTIDE 5: 11.3 pg/mL (ref 0.0–100.0)

## 2015-05-30 MED ORDER — OXYCODONE-ACETAMINOPHEN 5-325 MG PO TABS
1.0000 | ORAL_TABLET | Freq: Four times a day (QID) | ORAL | Status: DC | PRN
  Administered 2015-05-31 – 2015-06-01 (×2): 1 via ORAL
  Filled 2015-05-30 (×4): qty 1

## 2015-05-30 MED ORDER — ENOXAPARIN SODIUM 40 MG/0.4ML ~~LOC~~ SOLN
40.0000 mg | Freq: Every day | SUBCUTANEOUS | Status: DC
  Administered 2015-05-30 – 2015-06-02 (×4): 40 mg via SUBCUTANEOUS
  Filled 2015-05-30 (×4): qty 0.4

## 2015-05-30 MED ORDER — DOCUSATE SODIUM 100 MG PO CAPS
100.0000 mg | ORAL_CAPSULE | Freq: Two times a day (BID) | ORAL | Status: DC | PRN
  Administered 2015-06-02: 100 mg via ORAL
  Filled 2015-05-30: qty 1

## 2015-05-30 MED ORDER — CALCIUM CARBONATE-VITAMIN D 500-200 MG-UNIT PO TABS
1.0000 | ORAL_TABLET | Freq: Two times a day (BID) | ORAL | Status: DC
  Administered 2015-05-31 – 2015-06-03 (×7): 1 via ORAL
  Filled 2015-05-30 (×7): qty 1

## 2015-05-30 MED ORDER — LEVOFLOXACIN IN D5W 750 MG/150ML IV SOLN
750.0000 mg | Freq: Every day | INTRAVENOUS | Status: DC
  Administered 2015-05-30 – 2015-06-01 (×3): 750 mg via INTRAVENOUS
  Filled 2015-05-30 (×3): qty 150

## 2015-05-30 MED ORDER — ALPRAZOLAM 0.25 MG PO TABS
0.2500 mg | ORAL_TABLET | Freq: Once | ORAL | Status: AC
  Administered 2015-05-30: 0.25 mg via ORAL
  Filled 2015-05-30: qty 1

## 2015-05-30 MED ORDER — VANCOMYCIN HCL 10 G IV SOLR
1500.0000 mg | Freq: Once | INTRAVENOUS | Status: AC
  Administered 2015-05-30: 1500 mg via INTRAVENOUS
  Filled 2015-05-30: qty 1500

## 2015-05-30 MED ORDER — SODIUM CHLORIDE 0.9 % IV SOLN
INTRAVENOUS | Status: DC
  Administered 2015-05-30 – 2015-05-31 (×2): via INTRAVENOUS

## 2015-05-30 MED ORDER — DM-GUAIFENESIN ER 30-600 MG PO TB12
1.0000 | ORAL_TABLET | Freq: Two times a day (BID) | ORAL | Status: DC
  Administered 2015-05-30 – 2015-06-03 (×8): 1 via ORAL
  Filled 2015-05-30 (×8): qty 1

## 2015-05-30 MED ORDER — DEXTROSE 5 % IV SOLN
2.0000 g | Freq: Once | INTRAVENOUS | Status: AC
  Administered 2015-05-30: 2 g via INTRAVENOUS
  Filled 2015-05-30: qty 2

## 2015-05-30 MED ORDER — SODIUM CHLORIDE 0.9 % IV BOLUS (SEPSIS)
500.0000 mL | Freq: Once | INTRAVENOUS | Status: AC
  Administered 2015-05-30: 500 mL via INTRAVENOUS

## 2015-05-30 MED ORDER — PANTOPRAZOLE SODIUM 40 MG PO TBEC
40.0000 mg | DELAYED_RELEASE_TABLET | Freq: Every day | ORAL | Status: DC
  Administered 2015-05-30 – 2015-06-03 (×5): 40 mg via ORAL
  Filled 2015-05-30 (×5): qty 1

## 2015-05-30 MED ORDER — ALPRAZOLAM 0.25 MG PO TABS
0.2500 mg | ORAL_TABLET | Freq: Two times a day (BID) | ORAL | Status: DC | PRN
  Administered 2015-05-31 – 2015-06-01 (×2): 0.25 mg via ORAL
  Filled 2015-05-30 (×2): qty 1

## 2015-05-30 MED ORDER — CYANOCOBALAMIN 500 MCG PO TABS
500.0000 ug | ORAL_TABLET | Freq: Every day | ORAL | Status: DC
  Administered 2015-05-31 – 2015-06-03 (×4): 500 ug via ORAL
  Filled 2015-05-30 (×4): qty 1

## 2015-05-30 MED ORDER — LEVOTHYROXINE SODIUM 112 MCG PO TABS
112.0000 ug | ORAL_TABLET | Freq: Every day | ORAL | Status: DC
  Administered 2015-05-31: 112 ug via ORAL
  Filled 2015-05-30 (×2): qty 1

## 2015-05-30 MED ORDER — LORATADINE 10 MG PO TABS: 10.0000 mg | ORAL_TABLET | Freq: Every day | ORAL | Status: DC

## 2015-05-30 MED ORDER — DULOXETINE HCL 60 MG PO CPEP
60.0000 mg | ORAL_CAPSULE | Freq: Every day | ORAL | Status: DC
  Administered 2015-05-30 – 2015-06-03 (×5): 60 mg via ORAL
  Filled 2015-05-30 (×5): qty 1

## 2015-05-30 MED ORDER — ROSUVASTATIN CALCIUM 20 MG PO TABS
20.0000 mg | ORAL_TABLET | Freq: Every day | ORAL | Status: DC
  Administered 2015-05-31 – 2015-06-03 (×4): 20 mg via ORAL
  Filled 2015-05-30 (×4): qty 1

## 2015-05-30 MED ORDER — ATENOLOL 25 MG PO TABS
25.0000 mg | ORAL_TABLET | Freq: Every day | ORAL | Status: DC
  Administered 2015-05-31 – 2015-06-03 (×4): 25 mg via ORAL
  Filled 2015-05-30 (×5): qty 1

## 2015-05-30 MED ORDER — IPRATROPIUM-ALBUTEROL 0.5-2.5 (3) MG/3ML IN SOLN: 3.0000 mL | RESPIRATORY_TRACT | Status: DC | PRN

## 2015-05-30 MED ORDER — HYDRALAZINE HCL 20 MG/ML IJ SOLN: 5.0000 mg | INTRAMUSCULAR | Status: DC | PRN

## 2015-05-30 MED ORDER — VANCOMYCIN HCL 10 G IV SOLR: 1250.0000 mg | Freq: Two times a day (BID) | INTRAVENOUS | Status: DC

## 2015-05-30 MED ORDER — LORATADINE 10 MG PO TABS
10.0000 mg | ORAL_TABLET | Freq: Every day | ORAL | Status: DC
  Administered 2015-05-30 – 2015-06-03 (×5): 10 mg via ORAL
  Filled 2015-05-30 (×5): qty 1

## 2015-05-30 MED ORDER — IPRATROPIUM-ALBUTEROL 0.5-2.5 (3) MG/3ML IN SOLN
3.0000 mL | Freq: Once | RESPIRATORY_TRACT | Status: AC
Start: 2015-05-30 — End: 2015-05-30
  Administered 2015-05-30: 3 mL via RESPIRATORY_TRACT
  Filled 2015-05-30: qty 3

## 2015-05-30 MED ORDER — ALBUTEROL SULFATE (2.5 MG/3ML) 0.083% IN NEBU: 5.0000 mg | INHALATION_SOLUTION | Freq: Once | RESPIRATORY_TRACT | Status: DC

## 2015-05-30 NOTE — ED Notes (Signed)
Patient transported to CT 

## 2015-05-30 NOTE — ED Notes (Signed)
Unable to obtain 2nd set of blood culture, pt requested not to be stuck again.

## 2015-05-30 NOTE — ED Provider Notes (Signed)
CSN: [id number]     Arrival date & time 05/30/15  1600 History   First MD Initiated Contact with Patient 05/30/15 1726     Chief Complaint  Patient presents with  . Shortness of Breath     (Consider location/radiation/quality/duration/timing/severity/associated sxs/prior Treatment) HPI   Danielle [name] is a 70 year old femaleHistory of sleep apnea, hypertension, hypercholesterolemia, GERD, hypothyroidism, presents emergency department for evaluation of shortness of breath. 2 weeks ago she was in same ER status post mechanical fall where she was found to have a tibia plateau fracture, and was admitted overnight. today she was following up with her orthopedic surgeon, and complained of 1 week of progressive shortness of breath with dry cough with generalized weakness and fatigue with exertion. Dr. Maxie Better sent her to the ER to rule out PE.  She has not been taking a baby aspirin, she has never had any DVTs or PEs before, no anticoagulant use. She denies any pulmonary disease history including no asthma, no smoking history.  She does have history of obstructive sleep apnea. She denies any productive cough, fever, wheeze. She states that 3 nights ago she experienced sudden onset central chest pain that was severe and sharp, without radiation, lasting several hours w/o rating or alleviating factors, pain spontaneously resolved. She has had no recurrence of chest pain and currently denies chest pain.  She reports some swelling of her bilateral lower extremities, she states that her left lower extremities always swollen she denies any erythema, and currently she states that both of her feet are improved from her baseline edema.  Her shortness of breath is worse with exertion and she believes it is slightly worse when laying flat.  She denies fevers, sweats or chills but states she has been having hot flashes that feel like boiling water being poured over her, she states this is new over the past 2-3 weeks.   She also complains of urinary frequency which she attributes towards her HCTZ.  She denies dysuria, hematuria, flank pain, nausea, vomiting.  No other acute complaints  Past Medical History  Diagnosis Date  . Difficult intubation 02-13-11    with surgery -multiple times  . Glaucoma 02-13-11    bil. tx. eye drops daily  . Sleep apnea 02-13-11    Hx. sleep apnea-uses cpap since 10'12  . Heart murmur 02-13-11    was told-no issues with this  . Sinus drainage 02-13-11    uses Zyrtec as needed  . Hypothyroidism 02-13-11    tx. with Levothyroxine  . GERD (gastroesophageal reflux disease) 02-13-11    tx. Omeprazole  . Headache(784.0) 02-13-11    past hx. migraines, none recent  . Arthritis 02-13-11    osteoarthritis, hips, knees,s/p Cervical fusion(DDD)  . Hypertension 02-13-11    tx. Atenolol  . Hypercholesterolemia   . Depression with anxiety    Past Surgical History  Procedure Laterality Date  . Abdominal hysterectomy  02-13-11  . Joint replacement  02-13-11    RTHA-a yr ago in [place]  . Cervical fusion  02-13-11    '92- retained hardware  . Cholecystectomy  02-13-11    '98-lap. galbladder removal due to stones  . Tubal ligation  02-13-11  . Appendectomy  02-13-11    Lap. removal '92  . Tonsillectomy  02-13-11    child  . Gum surgery  02-13-11    "small mouth"  . Total hip arthroplasty  02/17/2011    Procedure: TOTAL HIP ARTHROPLASTY ANTERIOR APPROACH;  Surgeon: Mauri Pole, MD;  Location: [facility];  Service: Orthopedics;  Laterality: Left;   Family History  Problem Relation Age of Onset  . Cancer Mother     lung   Social History  Substance Use Topics  . Smoking status: Never Smoker   . Smokeless tobacco: None  . Alcohol Use: Yes     Comment: rare   OB History    No data available     Review of Systems  Constitutional: Positive for fatigue. Negative for fever, chills, diaphoresis, activity change and appetite change.  Eyes: Negative.   Endocrine: Negative.   Skin: Negative.  Negative for  color change, pallor, rash and wound.  Neurological: Negative.  Negative for numbness.  All other systems reviewed and are negative.     Allergies  Ceftin; Codeine; and Compazine  Home Medications   Prior to Admission medications   Medication Sig Start Date End Date Taking? Authorizing Provider  ALPRAZolam (XANAX) 0.25 MG tablet TK 1 T PO BID PRN ANXIETY 05/24/15  Yes Historical Provider, MD  atenolol (TENORMIN) 25 MG tablet Take 25 mg by mouth every morning.     Yes Historical Provider, MD  calcium-vitamin D (OSCAL WITH D) 500-200 MG-UNIT per tablet Take 1 tablet by mouth 2 (two) times daily.     Yes Historical Provider, MD  cetirizine (ZYRTEC) 10 MG tablet Take 10 mg by mouth daily as needed for allergies.    Yes Historical Provider, MD  DULoxetine (CYMBALTA) 60 MG capsule Take 60 mg by mouth daily.   Yes Historical Provider, MD  hydrochlorothiazide (HYDRODIURIL) 25 MG tablet Take 25 mg by mouth daily.   Yes Historical Provider, MD  Levothyroxine Sodium (TIROSINT) 112 MCG CAPS Take 112 mcg by mouth daily before breakfast.   Yes Historical Provider, MD  loratadine (ALLERGY) 10 MG tablet Take 10 mg by mouth daily.   Yes Historical Provider, MD  omeprazole (PRILOSEC) 20 MG capsule Take 20 mg by mouth daily as needed (for heartburn).    Yes Historical Provider, MD  rosuvastatin (CRESTOR) 20 MG tablet Take 20 mg by mouth daily.   Yes Historical Provider, MD  vitamin B-12 (CYANOCOBALAMIN) 500 MCG tablet Take 500 mcg by mouth daily. Reported on 05/16/2015   Yes Historical Provider, MD  docusate sodium (COLACE) 100 MG capsule Take 1 capsule (100 mg total) by mouth 2 (two) times daily. Patient not taking: Reported on 05/30/2015 05/17/15   Cecilie Kicks, PA-C  ondansetron (ZOFRAN-ODT) 8 MG disintegrating tablet DIS 1 T ON THE TONGUE Q 6 H PRF NAUSEA 05/17/15   Historical Provider, MD  oxyCODONE-acetaminophen (ROXICET) 5-325 MG tablet Take 1 tablet by mouth every 6 (six) hours as needed for severe  pain. 05/17/15   Cecilie Kicks, PA-C   BP 107/54 mmHg  Pulse 115  Temp(Src) 98.6 F (37 C) (Oral)  Resp 27  Ht 5\' 3"  (1.6 m)  Wt 90.719 kg  BMI 35.44 kg/m2  SpO2 90% Physical Exam  Constitutional: She is oriented to person, place, and time. She appears well-developed and well-nourished. No distress.  HENT:  Head: Normocephalic and atraumatic.  Nose: Nose normal.  Mouth/Throat: Oropharynx is clear and moist.  Eyes: Conjunctivae and EOM are normal. Pupils are equal, round, and reactive to light. Right eye exhibits no discharge. Left eye exhibits no discharge. No scleral icterus.  Neck: Normal range of motion. No JVD present. No tracheal deviation present. No thyromegaly present.  Cardiovascular: Normal rate, regular rhythm, normal heart sounds and intact distal pulses.  Exam  reveals no gallop and no friction rub.   No murmur heard. Mild bilateral pedal edema without pitting edema, no pretibial edema  Pulmonary/Chest: Tachypnea noted. No respiratory distress. She has no wheezes. She has rales. She exhibits no tenderness.  Coarse bibasilar rales, patient can speak only 1-2 words at a time after minimal exertion, tachypneic at rest, no rhonchi, no wheeze  Abdominal: Soft. Bowel sounds are normal. She exhibits no distension and no mass. There is no tenderness. There is no rebound and no guarding.  Musculoskeletal: She exhibits tenderness.  Right LE in foam brace  Lymphadenopathy:    She has no cervical adenopathy.  Neurological: She is alert and oriented to person, place, and time. She has normal reflexes. No cranial nerve deficit. She exhibits normal muscle tone. Coordination normal.  Skin: Skin is warm and dry. No rash noted. She is not diaphoretic. No erythema. No pallor.  Psychiatric: She has a normal mood and affect. Her behavior is normal. Judgment and thought content normal.  Nursing note and vitals reviewed.   ED Course  Procedures (including critical care time) Labs  Review Labs Reviewed  BASIC METABOLIC PANEL - Abnormal; Notable for the following:    Glucose, Bld 159 (*)    All other components within normal limits  URINALYSIS, ROUTINE W REFLEX MICROSCOPIC (NOT AT [facility]) - Abnormal; Notable for the following:    APPearance CLOUDY (*)    Leukocytes, UA MODERATE (*)    All other components within normal limits  URINE MICROSCOPIC-ADD ON - Abnormal; Notable for the following:    Squamous Epithelial / LPF 6-30 (*)    Bacteria, UA MANY (*)    All other components within normal limits  CULTURE, BLOOD (ROUTINE X 2)  CULTURE, BLOOD (ROUTINE X 2)  RESPIRATORY PANEL BY PCR  CULTURE, EXPECTORATED SPUTUM-ASSESSMENT  GRAM STAIN  URINE CULTURE  CBC  TROPONIN I  PROTIME-INR  BRAIN NATRIURETIC PEPTIDE  HIV ANTIBODY (ROUTINE TESTING)  STREP PNEUMONIAE URINARY ANTIGEN  LEGIONELLA PNEUMOPHILA SEROGP 1 UR AG  LACTIC ACID, PLASMA  LACTIC ACID, PLASMA  PROCALCITONIN  TSH  LIPID PANEL    Imaging Review Dg Chest 2 View  05/30/2015  CLINICAL DATA:  Shortness of breath and dry cough for 1 week. History of hypertension. EXAM: CHEST  2 VIEW COMPARISON:  Chest x-ray dated 02/13/2011. FINDINGS: Study is hypoinspiratory with crowding of the perihilar and bibasilar bronchovascular markings. Given the low lung volumes, lungs appear clear. No confluent opacity to suggest a developing pneumonia. No pleural effusion or pneumothorax seen. Heart size is within normal limits. Overall cardiomediastinal silhouette is stable in size and configuration. Anterior cervical fusion hardware appears stable in position. No acute osseous abnormality seen. IMPRESSION: Low lung volumes. No evidence of acute cardiopulmonary abnormality. No pneumonia seen. Electronically Signed   By: Franki Cabot M.D.   On: 05/30/2015 17:34   Ct Angio Chest Pe W/cm &/or Wo Cm  05/30/2015  CLINICAL DATA:  Shortness of breath and dyspnea with exertion. Tibial plateau fracture 2 weeks ago. EXAM: CT ANGIOGRAPHY  CHEST WITH CONTRAST TECHNIQUE: Multidetector CT imaging of the chest was performed using the standard protocol during bolus administration of intravenous contrast. Multiplanar CT image reconstructions and MIPs were obtained to evaluate the vascular anatomy. CONTRAST:  65 cc Isovue 370 COMPARISON:  None. FINDINGS: Mediastinum/Lymph Nodes: Some of the most peripheral segmental and subsegmental pulmonary artery branches are difficult to definitively characterize due to patient breathing motion artifact. There is no pulmonary embolism identified within the main,  lobar or central segmental pulmonary arteries. Mild cardiomegaly. No pericardial effusion. Scattered atherosclerotic changes are seen along the walls of the normal-caliber thoracic aorta. No aortic aneurysm or dissection. No mass or enlarged lymph nodes seen within the mediastinum. Trachea and central bronchi are unremarkable. Lungs/Pleura: Small patchy consolidations are seen throughout both lungs, some ground-glass density. No pleural effusion or pneumothorax. Upper abdomen: No acute findings.  Status post cholecystectomy. Musculoskeletal: Anterior cervical fusion hardware partially imaged in the lower cervical spine. Mild degenerative spurring throughout the thoracic spine. No acute - appearing osseous abnormality. Review of the MIP images confirms the above findings. IMPRESSION: 1. No pulmonary embolism seen, with mild study limitations detailed above. 2. Mild cardiomegaly. 3. No aortic aneurysm or dissection. 4. Patchy small consolidations scattered throughout both lungs, some of which are ground-glass density. The consolidations at each lung base are most likely atelectasis, less likely pneumonia or aspiration. Differential for the remainder of the scattered small consolidations (most being ground-glass density) includes atypical pneumonias such as viral or fungal, interstitial pneumonias, edema related to volume overload/CHF, chronic interstitial  diseases, hypersensitivity pneumonitis, and respiratory bronchiolitis. Electronically Signed   By: Franki Cabot M.D.   On: 05/30/2015 19:07   I have personally reviewed and evaluated these images and lab results as part of my medical decision-making.   EKG Interpretation None      MDM   70 y/o female, s/p tibial plateau fracture 2 weeks ago,  Sent to the ER by Dr. Maxie Better, her orthopedist surgeon, for rule out PE for 1 week of progressive shortness of breath, dyspnea on exertion weakness and fatigue with minimal exertion, dry cough.  BMP, CBC, trop, Pt-INR, BNP, chest x-ray, EKG, monitoring and CT angio ordered Pt appears very SOB after transferring into ER gurney.  At ER triage initial pulse ox is 91% on room air she is mildly tachycardic heart rate 105.  She is tachypneic at time of exam, and later required supplemental O2 to maintain sat's at rest.  Urinalysis is pertinent for immediate bacteria, moderate leukocytes, although it is a contaminated sample. Chemistry unremarkable, cell counts normal, no leukocytosis.  CT engineer negative for pulmonary embolism, small patchy consolidations scattered throughout both lungs, with consolidation of lung bases. Radiology favors lung base consolidation and atelectasis and remaining consolidations include wide differential such as atypical pneumonia, interstitial pneumonia, fluid overload/CHF edema, chronic interstitial disease, etc. Patient has been recently in the hospital and has had a dry cough, blood cultures obtained and patient was given age IV antibiotics, more likely atypical pneumonia. BNP resulted was negative, CHF unlikely.  Pt will be admitted for dyspnea with hypoxia.  Dr. Blaine Hamper to admit to tele.  Final diagnoses:  Dyspnea  Sepsis ([place])  Hypothyroidism, unspecified hypothyroidism type       Delsa Grana, PA-C 05/30/15 2224  Charlesetta Shanks, MD 05/30/15 2325

## 2015-05-30 NOTE — ED Notes (Signed)
Pt being sent by Bean MD r/o PE.  C/o SOB and dyspnea w/ exertion.  Pt had tibial plateau fracture x 2 weeks ago and has not been taking aspirin.

## 2015-05-30 NOTE — ED Notes (Signed)
Pt reports sob for the past week accompanied by a dry cough. Also having weakness/fatigue with movement.

## 2015-05-30 NOTE — Progress Notes (Signed)
Pharmacy Antibiotic Note  Danielle [name] is a 70 y.o. female admitted on 05/30/2015 with suspected HCAP. Aztreonam is ordered x 1 and pharmacy has been consulted for Vancomycin dosing.  Plan: Vancomycin 1500mg  x 1 then 1250mg  IV every 12 hours.  Goal trough 15-20 mcg/mL.  F/u admission orders for antibiotics. Measure Vanc trough at steady state. Follow up renal fxn, culture results, and clinical course.   Height: 5\' 3"  (160 cm) Weight: 200 lb (90.719 kg) IBW/kg (Calculated) : 52.4  Temp (24hrs), Avg:98.8 F (37.1 C), Min:98.6 F (37 C), Max:98.9 F (37.2 C)   Recent Labs Lab 05/30/15 1730  WBC 6.5  CREATININE 0.91    Estimated Creatinine Clearance: 62.4 mL/min (by C-G formula based on Cr of 0.91).    Allergies  Allergen Reactions  . Ceftin [Cefuroxime Axetil] Hives and Swelling    Swelling of face  . Codeine Nausea Only  . Compazine [Prochlorperazine] Swelling and Other (See Comments)    Face and tongue swelling    Antimicrobials this admission: Azactam x 1  Vanc 5/18 >>   Dose adjustments this admission:  Microbiology results: BCx: UCx:  Sputum:  MRSA PCR:  Thank you for allowing pharmacy to be a part of this patient's care.  Romeo Rabon, PharmD, pager [phone]. 05/30/2015,8:36 PM.

## 2015-05-30 NOTE — Discharge Instructions (Addendum)
Nonweightbearing Aspirin 325mg  twice daily to prevent clots

## 2015-05-30 NOTE — H&P (Addendum)
History and Physical    Danielle [name] [id number] DOB: [birth year] DOA: 05/30/2015  Referring MD/NP/PA:   PCP: Lottie Dawson, MD   Patient coming from:  At his baseline, she is independent for most of his ADL.        Chief Complaint: Shortness of breath and generalized weakness  HPI: Danielle [name] is a 70 y.o. female with medical history significant of hypertension, hyperlipidemia, GERD, hypothyroidism, depression, anxiety, OSA on CPAP, history of difficult intubation, recent right tibial plateau fracture (did not have surgery), who presents with shortness of breath and generalized weakness.  Pt states that she was admitted to the hospital from 5/4-5/5 due to right tibial plateau fracture secondary to mechanical fall. She did not require surgery and has brace in right leg. Pt states that in the past 3 days, she has been having shortness of breath and dry cough. The shortness of breath is aggravated by exertion. She has generalized weakness, but no unilateral weakness, numbness or tingliness in her extremities. She has mild left foot swelling, which has been going on before leg fracture. Patient has dry cough, but no fever or chills. She states that she had one episode of chest pain 3 days ago, which lasted for about 2-3 hours, then resolved spontaneously. Patient states that her chest pain was severe, located in substernal area, nonradiating. She did not have new episodes of chest pain since then. Patient has mild nausea, but no vomiting, abdominal pain or diarrhea. She denies dysuria or burning on urination. She states that she always has urinary frequency, which is probably due to HCTZ use.  ED Course: pt was found to have negative troponin, BNP 11.3, INR 1.06, WBCs 6.5, temperature normal, has tachycardia, no tachypnea, oxygen desaturated to 91% on room air, positive urinalysis with moderate amount of leukocytes, electrolytes renal function okay. Chest x-ray showed low lung  volume, but no infiltration. CT angiogram is negative for PE, but showed possible atypical pneumonia (some of the most peripheral segmental and subsegmental pulmonary artery branches are difficult to definitivelycharacterize due to patient breathing motion artifact. There is no pulmonary embolism identified within the main, lobar or central segmental pulmonary arteries). Pt is placed on tele bed for obs.  Review of Systems:   General: no fevers, chills, no changes in body weight, has poor appetite, has fatigue HEENT: no blurry vision, hearing changes or sore throat Pulm: has dyspnea, coughing, no wheezing CV: has chest pain, no palpitations Abd: has nausea, no vomiting, abdominal pain, diarrhea, constipation GU: no dysuria, burning on urination, has increased urinary frequency, nohematuria  Ext: has left foot edema Neuro: no unilateral weakness, numbness, or tingling, no vision change or hearing loss Skin: no rash MSK: has right left pain from recent tibial plateau fracture. Heme: No easy bruising.  Travel history: No recent long distant travel.  Allergy:  Allergies  Allergen Reactions  . Ceftin [Cefuroxime Axetil] Hives and Swelling    Swelling of face  . Codeine Nausea Only  . Compazine [Prochlorperazine] Swelling and Other (See Comments)    Face and tongue swelling    Past Medical History  Diagnosis Date  . Difficult intubation 02-13-11    with surgery -multiple times  . Glaucoma 02-13-11    bil. tx. eye drops daily  . Sleep apnea 02-13-11    Hx. sleep apnea-uses cpap since 10'12  . Heart murmur 02-13-11    was told-no issues with this  . Sinus drainage 02-13-11    uses Zyrtec as  needed  . Hypothyroidism 02-13-11    tx. with Levothyroxine  . GERD (gastroesophageal reflux disease) 02-13-11    tx. Omeprazole  . Headache(784.0) 02-13-11    past hx. migraines, none recent  . Arthritis 02-13-11    osteoarthritis, hips, knees,s/p Cervical fusion(DDD)  . Hypertension 02-13-11    tx. Atenolol    . Hypercholesterolemia   . Depression with anxiety     Past Surgical History  Procedure Laterality Date  . Abdominal hysterectomy  02-13-11  . Joint replacement  02-13-11    RTHA-a yr ago in [place]  . Cervical fusion  02-13-11    '92- retained hardware  . Cholecystectomy  02-13-11    '98-lap. galbladder removal due to stones  . Tubal ligation  02-13-11  . Appendectomy  02-13-11    Lap. removal '92  . Tonsillectomy  02-13-11    child  . Gum surgery  02-13-11    "small mouth"  . Total hip arthroplasty  02/17/2011    Procedure: TOTAL HIP ARTHROPLASTY ANTERIOR APPROACH;  Surgeon: Mauri Pole, MD;  Location: [facility];  Service: Orthopedics;  Laterality: Left;    Social History:  reports that she has never smoked. She does not have any smokeless tobacco history on file. She reports that she drinks alcohol. She reports that she does not use illicit drugs.  Family History:  Family History  Problem Relation Age of Onset  . Cancer Mother     lung     Prior to Admission medications   Medication Sig Start Date End Date Taking? Authorizing Provider  ALPRAZolam (XANAX) 0.25 MG tablet TK 1 T PO BID PRN ANXIETY 05/24/15  Yes Historical Provider, MD  atenolol (TENORMIN) 25 MG tablet Take 25 mg by mouth every morning.     Yes Historical Provider, MD  calcium-vitamin D (OSCAL WITH D) 500-200 MG-UNIT per tablet Take 1 tablet by mouth 2 (two) times daily.     Yes Historical Provider, MD  cetirizine (ZYRTEC) 10 MG tablet Take 10 mg by mouth daily as needed for allergies.    Yes Historical Provider, MD  DULoxetine (CYMBALTA) 60 MG capsule Take 60 mg by mouth daily.   Yes Historical Provider, MD  hydrochlorothiazide (HYDRODIURIL) 25 MG tablet Take 25 mg by mouth daily.   Yes Historical Provider, MD  Levothyroxine Sodium (TIROSINT) 112 MCG CAPS Take 112 mcg by mouth daily before breakfast.   Yes Historical Provider, MD  loratadine (ALLERGY) 10 MG tablet Take 10 mg by mouth daily.   Yes Historical Provider, MD   omeprazole (PRILOSEC) 20 MG capsule Take 20 mg by mouth daily as needed (for heartburn).    Yes Historical Provider, MD  rosuvastatin (CRESTOR) 20 MG tablet Take 20 mg by mouth daily.   Yes Historical Provider, MD  vitamin B-12 (CYANOCOBALAMIN) 500 MCG tablet Take 500 mcg by mouth daily. Reported on 05/16/2015   Yes Historical Provider, MD  docusate sodium (COLACE) 100 MG capsule Take 1 capsule (100 mg total) by mouth 2 (two) times daily. Patient not taking: Reported on 05/30/2015 05/17/15   Cecilie Kicks, PA-C  ondansetron (ZOFRAN-ODT) 8 MG disintegrating tablet DIS 1 T ON THE TONGUE Q 6 H PRF NAUSEA 05/17/15   Historical Provider, MD  oxyCODONE-acetaminophen (ROXICET) 5-325 MG tablet Take 1 tablet by mouth every 6 (six) hours as needed for severe pain. 05/17/15   Cecilie Kicks, PA-C    Physical Exam: Filed Vitals:   05/30/15 1634 05/30/15 1800 05/30/15 2001 05/30/15 2059  BP: 113/54 91/30 108/32 107/54  Pulse: 105 99 108 115  Temp: 98.9 F (37.2 C)  98.6 F (37 C)   TempSrc: Oral  Oral   Resp: 20 13 19 27   Height: 5\' 3"  (1.6 m)     Weight: 90.719 kg (200 lb)     SpO2: 91% 95% 98% 90%   General: Not in acute distress HEENT:       Eyes: PERRL, EOMI, no scleral icterus.       ENT: No discharge from the ears and nose, no pharynx injection, no tonsillar enlargement.        Neck: No JVD, no bruit, no mass felt. Heme: No neck lymph node enlargement. Cardiac: S1/S2, RRR,Tachycardia, No murmurs, No gallops or rubs. Pulm: has coarse breathing sound. No rales, wheezing, rhonchi or rubs. Abd: Soft, nondistended, nontender, no rebound pain, no organomegaly, BS present. GU: No hematuria Ext: has mild left ankle edema. 2+DP/PT pulse bilaterally. Musculoskeletal: has brace in right leg for her recent tibial plateau fracture, still has mild tenderness. Skin: No rashes.  Neuro: Alert, oriented X3, cranial nerves II-XII grossly intact, moves all extremities normally. Psych: Patient is not  psychotic, no suicidal or hemocidal ideation.  Labs on Admission: I have personally reviewed following labs and imaging studies  CBC:  Recent Labs Lab 05/30/15 1730  WBC 6.5  HGB 12.6  HCT 38.0  MCV 91.6  PLT 123456   Basic Metabolic Panel:  Recent Labs Lab 05/30/15 1730  NA 138  K 4.0  CL 102  CO2 28  GLUCOSE 159*  BUN 13  CREATININE 0.91  CALCIUM 9.4   GFR: Estimated Creatinine Clearance: 62.4 mL/min (by C-G formula based on Cr of 0.91). Liver Function Tests: No results for input(s): AST, ALT, ALKPHOS, BILITOT, PROT, ALBUMIN in the last 168 hours. No results for input(s): LIPASE, AMYLASE in the last 168 hours. No results for input(s): AMMONIA in the last 168 hours. Coagulation Profile:  Recent Labs Lab 05/30/15 1730  INR 1.06   Cardiac Enzymes:  Recent Labs Lab 05/30/15 1742  TROPONINI <0.03   BNP (last 3 results) No results for input(s): PROBNP in the last 8760 hours. HbA1C: No results for input(s): HGBA1C in the last 72 hours. CBG: No results for input(s): GLUCAP in the last 168 hours. Lipid Profile: No results for input(s): CHOL, HDL, LDLCALC, TRIG, CHOLHDL, LDLDIRECT in the last 72 hours. Thyroid Function Tests: No results for input(s): TSH, T4TOTAL, FREET4, T3FREE, THYROIDAB in the last 72 hours. Anemia Panel: No results for input(s): VITAMINB12, FOLATE, FERRITIN, TIBC, IRON, RETICCTPCT in the last 72 hours. Urine analysis:    Component Value Date/Time   COLORURINE YELLOW 05/30/2015 1744   APPEARANCEUR CLOUDY* 05/30/2015 1744   LABSPEC 1.019 05/30/2015 1744   PHURINE 6.0 05/30/2015 1744   GLUCOSEU NEGATIVE 05/30/2015 1744   HGBUR NEGATIVE 05/30/2015 1744   [place] 05/30/2015 1744   [place] 05/30/2015 1744   PROTEINUR NEGATIVE 05/30/2015 1744   UROBILINOGEN 0.2 02/13/2011 1018   NITRITE NEGATIVE 05/30/2015 1744   LEUKOCYTESUR MODERATE* 05/30/2015 1744   Sepsis Labs: @LABRCNTIP (procalcitonin:4,lacticidven:4) )No  results found for this or any previous visit (from the past 240 hour(s)).   Radiological Exams on Admission: Dg Chest 2 View  05/30/2015  CLINICAL DATA:  Shortness of breath and dry cough for 1 week. History of hypertension. EXAM: CHEST  2 VIEW COMPARISON:  Chest x-ray dated 02/13/2011. FINDINGS: Study is hypoinspiratory with crowding of the perihilar and bibasilar bronchovascular markings. Given the  low lung volumes, lungs appear clear. No confluent opacity to suggest a developing pneumonia. No pleural effusion or pneumothorax seen. Heart size is within normal limits. Overall cardiomediastinal silhouette is stable in size and configuration. Anterior cervical fusion hardware appears stable in position. No acute osseous abnormality seen. IMPRESSION: Low lung volumes. No evidence of acute cardiopulmonary abnormality. No pneumonia seen. Electronically Signed   By: Franki Cabot M.D.   On: 05/30/2015 17:34   Ct Angio Chest Pe W/cm &/or Wo Cm  05/30/2015  CLINICAL DATA:  Shortness of breath and dyspnea with exertion. Tibial plateau fracture 2 weeks ago. EXAM: CT ANGIOGRAPHY CHEST WITH CONTRAST TECHNIQUE: Multidetector CT imaging of the chest was performed using the standard protocol during bolus administration of intravenous contrast. Multiplanar CT image reconstructions and MIPs were obtained to evaluate the vascular anatomy. CONTRAST:  65 cc Isovue 370 COMPARISON:  None. FINDINGS: Mediastinum/Lymph Nodes: Some of the most peripheral segmental and subsegmental pulmonary artery branches are difficult to definitively characterize due to patient breathing motion artifact. There is no pulmonary embolism identified within the main, lobar or central segmental pulmonary arteries. Mild cardiomegaly. No pericardial effusion. Scattered atherosclerotic changes are seen along the walls of the normal-caliber thoracic aorta. No aortic aneurysm or dissection. No mass or enlarged lymph nodes seen within the mediastinum. Trachea  and central bronchi are unremarkable. Lungs/Pleura: Small patchy consolidations are seen throughout both lungs, some ground-glass density. No pleural effusion or pneumothorax. Upper abdomen: No acute findings.  Status post cholecystectomy. Musculoskeletal: Anterior cervical fusion hardware partially imaged in the lower cervical spine. Mild degenerative spurring throughout the thoracic spine. No acute - appearing osseous abnormality. Review of the MIP images confirms the above findings. IMPRESSION: 1. No pulmonary embolism seen, with mild study limitations detailed above. 2. Mild cardiomegaly. 3. No aortic aneurysm or dissection. 4. Patchy small consolidations scattered throughout both lungs, some of which are ground-glass density. The consolidations at each lung base are most likely atelectasis, less likely pneumonia or aspiration. Differential for the remainder of the scattered small consolidations (most being ground-glass density) includes atypical pneumonias such as viral or fungal, interstitial pneumonias, edema related to volume overload/CHF, chronic interstitial diseases, hypersensitivity pneumonitis, and respiratory bronchiolitis. Electronically Signed   By: Franki Cabot M.D.   On: 05/30/2015 19:07     EKG: Independently reviewed. Sinus rhythm, QTC 455 come tachycardia, no ischemic change  Assessment/Plan Principal Problem:   Acute respiratory failure with hypoxia (HCC) Active Problems:   Tibial plateau fracture   Closed tibial fracture   Sleep apnea   Hypertension   Hypercholesterolemia   GERD (gastroesophageal reflux disease)   Atypical pneumonia   Dyspnea   Hypothyroidism   Depression with anxiety   Acute respiratory failure with hypoxia ([place]): this is likely due to atypical infection. Patient has normal BNP and no JVD, less likely to have congestive heart failure. CT angiogram is negative for PE though study was limited due to breathing motion artifact. Pt has tachycardia, but no  fever or leukocytosis. She does not meet criteria for sepsis, but pending lactate level. If lactate level is elevated, she will meet criteria for sepsis. Her blood pressure is soft 91/30, which improved to 108/32 after 500 mL normal saline bolus.   - Will place pt on Telemetry Bed for obs - IV Vancomycin and cefepime were started in ED, will change to Levaquin.  - Mucinex for cough  - DuoNeb Neb prn for SOB - Urine legionella and S. pneumococcal antigen - Follow up blood  culture x2, sputum culture and respiratory virus panel - will get Procalcitonin and trend lactic acid level - IVF: 500 mL of NS bolus in ED, followed by 100 mL per hour of NS  - LE venous Doppler  Addendum: lactic acid came elevated at 2.1, plus tachycardia, patient needs criteria for sepsis. -will given 1.5 L of NS now, and trend lactate level  Tibial plateau fracture: CT-scan on 05/16/15 showed longitudinal/vertical fractures involving the lateral tibial plateau with minimal depression posteriorly estimated at 3 mm. She was seen by ortho, Dr.Beane, did not require surgery. Has brace on. -po/ot -prn percoct for pain -pt may need some anticoagulants for short-term DVT prophylaxis at discharge  HTN: -hold HCTZ since pt's bp is soft -continue atenolol atenolol -IV hydralazine when necessary  OSA: -CPAP  HLD: Last LDL was not on record -Continue home medications: Crestor -Check FLP  GERD: -Protonix  Hypothyroidism: Last TSH was not on record -Continue home Synthroid -Check TSH  Depression and anxiety: Stable, no suicidal or homicidal ideations. -Continue home medications: When necessary Xanax, Cymbalta   DVT ppx:  SQ Lovenox Code Status: Full code Family Communication:  Yes, patient's son at bed side Disposition Plan:  Anticipate discharge back to previous home environment Consults called:  none Admission status: Obs / tele   Date of Service 05/30/2015    Ivor Costa [facility] Pager  [phone]  If 7PM-7AM, please contact night-coverage [email or web address] Password [email or web address] 05/30/2015, 10:06 PM

## 2015-05-31 ENCOUNTER — Observation Stay (HOSPITAL_BASED_OUTPATIENT_CLINIC_OR_DEPARTMENT_OTHER): Payer: Medicare Other

## 2015-05-31 DIAGNOSIS — Z79899 Other long term (current) drug therapy: Secondary | ICD-10-CM | POA: Diagnosis not present

## 2015-05-31 DIAGNOSIS — S82141A Displaced bicondylar fracture of right tibia, initial encounter for closed fracture: Secondary | ICD-10-CM | POA: Diagnosis present

## 2015-05-31 DIAGNOSIS — Z888 Allergy status to other drugs, medicaments and biological substances status: Secondary | ICD-10-CM | POA: Diagnosis not present

## 2015-05-31 DIAGNOSIS — Z885 Allergy status to narcotic agent status: Secondary | ICD-10-CM | POA: Diagnosis not present

## 2015-05-31 DIAGNOSIS — Z981 Arthrodesis status: Secondary | ICD-10-CM | POA: Diagnosis not present

## 2015-05-31 DIAGNOSIS — S82291D Other fracture of shaft of right tibia, subsequent encounter for closed fracture with routine healing: Secondary | ICD-10-CM | POA: Diagnosis not present

## 2015-05-31 DIAGNOSIS — W1830XA Fall on same level, unspecified, initial encounter: Secondary | ICD-10-CM | POA: Diagnosis present

## 2015-05-31 DIAGNOSIS — I5030 Unspecified diastolic (congestive) heart failure: Secondary | ICD-10-CM | POA: Diagnosis present

## 2015-05-31 DIAGNOSIS — J9601 Acute respiratory failure with hypoxia: Secondary | ICD-10-CM | POA: Diagnosis not present

## 2015-05-31 DIAGNOSIS — J189 Pneumonia, unspecified organism: Secondary | ICD-10-CM

## 2015-05-31 DIAGNOSIS — F418 Other specified anxiety disorders: Secondary | ICD-10-CM | POA: Diagnosis present

## 2015-05-31 DIAGNOSIS — E039 Hypothyroidism, unspecified: Secondary | ICD-10-CM | POA: Diagnosis present

## 2015-05-31 DIAGNOSIS — Z881 Allergy status to other antibiotic agents status: Secondary | ICD-10-CM | POA: Diagnosis not present

## 2015-05-31 DIAGNOSIS — E669 Obesity, unspecified: Secondary | ICD-10-CM | POA: Diagnosis present

## 2015-05-31 DIAGNOSIS — K219 Gastro-esophageal reflux disease without esophagitis: Secondary | ICD-10-CM

## 2015-05-31 DIAGNOSIS — R600 Localized edema: Secondary | ICD-10-CM | POA: Diagnosis not present

## 2015-05-31 DIAGNOSIS — R609 Edema, unspecified: Secondary | ICD-10-CM | POA: Diagnosis present

## 2015-05-31 DIAGNOSIS — F419 Anxiety disorder, unspecified: Secondary | ICD-10-CM | POA: Diagnosis present

## 2015-05-31 DIAGNOSIS — E7800 Pure hypercholesterolemia, unspecified: Secondary | ICD-10-CM | POA: Diagnosis present

## 2015-05-31 DIAGNOSIS — F329 Major depressive disorder, single episode, unspecified: Secondary | ICD-10-CM | POA: Diagnosis present

## 2015-05-31 DIAGNOSIS — E785 Hyperlipidemia, unspecified: Secondary | ICD-10-CM | POA: Diagnosis present

## 2015-05-31 DIAGNOSIS — R0602 Shortness of breath: Secondary | ICD-10-CM | POA: Diagnosis not present

## 2015-05-31 DIAGNOSIS — R0600 Dyspnea, unspecified: Secondary | ICD-10-CM | POA: Diagnosis not present

## 2015-05-31 DIAGNOSIS — I110 Hypertensive heart disease with heart failure: Secondary | ICD-10-CM | POA: Diagnosis present

## 2015-05-31 DIAGNOSIS — Z9049 Acquired absence of other specified parts of digestive tract: Secondary | ICD-10-CM | POA: Diagnosis not present

## 2015-05-31 DIAGNOSIS — G4733 Obstructive sleep apnea (adult) (pediatric): Secondary | ICD-10-CM | POA: Diagnosis present

## 2015-05-31 LAB — RESPIRATORY PANEL BY PCR
Adenovirus: NOT DETECTED
BORDETELLA PERTUSSIS-RVPCR: NOT DETECTED
CORONAVIRUS HKU1-RVPPCR: NOT DETECTED
CORONAVIRUS OC43-RVPPCR: NOT DETECTED
Chlamydophila pneumoniae: NOT DETECTED
Coronavirus 229E: NOT DETECTED
Coronavirus NL63: NOT DETECTED
INFLUENZA A H1 2009-RVPPR: NOT DETECTED
INFLUENZA A H1-RVPPCR: NOT DETECTED
INFLUENZA A H3-RVPPCR: NOT DETECTED
INFLUENZA A-RVPPCR: NOT DETECTED
Influenza B: NOT DETECTED
MYCOPLASMA PNEUMONIAE-RVPPCR: NOT DETECTED
Metapneumovirus: NOT DETECTED
PARAINFLUENZA VIRUS 1-RVPPCR: NOT DETECTED
Parainfluenza Virus 2: NOT DETECTED
Parainfluenza Virus 3: NOT DETECTED
Parainfluenza Virus 4: NOT DETECTED
RESPIRATORY SYNCYTIAL VIRUS-RVPPCR: NOT DETECTED
Rhinovirus / Enterovirus: NOT DETECTED

## 2015-05-31 LAB — TSH: TSH: 5.024 u[IU]/mL — AB (ref 0.350–4.500)

## 2015-05-31 LAB — PROCALCITONIN: Procalcitonin: <0.1 ng/mL

## 2015-05-31 LAB — LIPID PANEL
CHOLESTEROL: 148 mg/dL (ref 0–200)
HDL: 43 mg/dL (ref >40)
LDL Cholesterol: 76 mg/dL (ref 0–99)
TRIGLYCERIDES: 145 mg/dL (ref <150)
Total CHOL/HDL Ratio: 3.4 RATIO
VLDL: 29 mg/dL (ref 0–40)

## 2015-05-31 LAB — GLUCOSE, CAPILLARY: Glucose-Capillary: 126 mg/dL — ABNORMAL HIGH (ref 65–99)

## 2015-05-31 LAB — LACTIC ACID, PLASMA
LACTIC ACID, VENOUS: 1.5 mmol/L (ref 0.5–2.0)
LACTIC ACID, VENOUS: 2.1 mmol/L — AB (ref 0.5–2.0)

## 2015-05-31 LAB — STREP PNEUMONIAE URINARY ANTIGEN: Strep Pneumo Urinary Antigen: NEGATIVE

## 2015-05-31 MED ORDER — ACETAMINOPHEN 325 MG PO TABS: 650.0000 mg | ORAL_TABLET | Freq: Four times a day (QID) | ORAL | Status: DC | PRN

## 2015-05-31 MED ORDER — SODIUM CHLORIDE 0.9 % IV BOLUS (SEPSIS)
1500.0000 mL | Freq: Once | INTRAVENOUS | Status: AC
  Administered 2015-05-31: 1500 mL via INTRAVENOUS

## 2015-05-31 MED ORDER — LEVOTHYROXINE SODIUM 25 MCG PO TABS
125.0000 ug | ORAL_TABLET | Freq: Every day | ORAL | Status: DC
  Administered 2015-06-01 – 2015-06-03 (×3): 125 ug via ORAL
  Filled 2015-05-31 (×3): qty 1

## 2015-05-31 MED ORDER — IBUPROFEN 200 MG PO TABS
400.0000 mg | ORAL_TABLET | Freq: Four times a day (QID) | ORAL | Status: DC | PRN
  Administered 2015-05-31: 400 mg via ORAL
  Filled 2015-05-31: qty 2

## 2015-05-31 MED ORDER — FUROSEMIDE 10 MG/ML IJ SOLN
20.0000 mg | Freq: Two times a day (BID) | INTRAMUSCULAR | Status: DC
  Administered 2015-05-31 (×2): 20 mg via INTRAVENOUS
  Filled 2015-05-31 (×2): qty 2

## 2015-05-31 MED ORDER — POTASSIUM CHLORIDE CRYS ER 20 MEQ PO TBCR
40.0000 meq | EXTENDED_RELEASE_TABLET | Freq: Every day | ORAL | Status: DC
  Administered 2015-05-31: 40 meq via ORAL
  Filled 2015-05-31 (×2): qty 2

## 2015-05-31 NOTE — Progress Notes (Signed)
VASCULAR LAB PRELIMINARY  PRELIMINARY  PRELIMINARY  PRELIMINARY  Bilateral lower extremity venous duplex  completed.    Preliminary report:  Bilateral:  No evidence of DVT, superficial thrombosis, or Baker's Cyst.    Chancellor Vanderloop, RVT 05/31/2015, 9:53 AM

## 2015-05-31 NOTE — Progress Notes (Addendum)
Occupational Therapy Evaluation Patient Details Name: Danielle [name] MRN: [id number] DOB: 1946-01-09 Today's Date: 05/31/2015    History of Present Illness 70 y.o. female with medical history significant of hypertension, hyperlipidemia, GERD, hypothyroidism, depression, anxiety, OSA on CPAP, history of difficult intubation, recent right tibial plateau fracture (did not have surgery) and admitted for acute respiratory failure   Clinical Impression   Patient presents to OT with decreased ADL independence and safety due to above diagnoses and below functional limitations. She will benefit from skilled OT to maximize function and to facilitate a safe discharge. OT will follow.  Patient reports DOE/SOB with minimal activity and may benefit from energy conservation education.    Follow Up Recommendations  Supervision/Assistance - 24 hour;Home health OT    Equipment Recommendations  3 in 1 bedside comode;Tub/shower bench    Recommendations for Other Services       Precautions / Restrictions Precautions Precautions: Fall Required Braces or Orthoses: Knee Immobilizer - Right Restrictions Weight Bearing Restrictions: Yes RLE Weight Bearing: Non weight bearing      Mobility Bed Mobility            General bed mobility comments: up in recliner eating lunch  Transfers                 Balance                                            ADL Overall ADL's : Needs assistance/impaired Eating/Feeding: Independent;Sitting                                     General ADL Comments: Limited evaluation due to patient fatigued from PT session a few minutes prior to OT evaluation. Patient reports she has been manging ADLs at home with help from her family. She has been sleeping in a recliner in her family room and has access to half bath on first floor. She has gone upstairs with son's assistance in order to take a shower, but this is very  effortful. She reports she is getting a stair lift installed but that wiill not be completed for a couple of weeks. She reports she is limited both by her NWB status and her SOB/fatigue/weakness. May benefit from energy conservation education. Patient also reports her husband, who is her primary caregiver, has a "bad back" and she is worried he will get hurt trying to help her.     Vision     Perception     Praxis      Pertinent Vitals/Pain Pain Assessment: No/denies pain     Hand Dominance Left   Extremity/Trunk Assessment Upper Extremity Assessment Upper Extremity Assessment: Overall WFL for tasks assessed   Lower Extremity Assessment Lower Extremity Assessment: Defer to PT evaluation RLE Deficits / Details: remained in KI       Communication Communication Communication: No difficulties   Cognition Arousal/Alertness: Awake/alert Behavior During Therapy: WFL for tasks assessed/performed Overall Cognitive Status: Within Functional Limits for tasks assessed                     General Comments       Exercises       Shoulder Instructions      Home Living Family/patient expects to be discharged to:: Private residence  Living Arrangements: Spouse/significant other Available Help at Discharge: Family Type of Home: House Home Access: Stairs to enter [employer] of Steps: 1   Home Layout: Able to live on main level with bedroom/bathroom;1/2 bath on main level         Bathroom Toilet: Standard     Home Equipment: Walker - 2 wheels;Bedside commode;Wheelchair - Media planner Comments: they are in process of installing a stair lift inside home      Prior Functioning/Environment Level of Independence: Independent with assistive device(s);Needs assistance    ADL's / Homemaking Assistance Needed: family has been assisting with BADLs/IADLs        OT Diagnosis: Generalized weakness   OT Problem List: Decreased  strength;Decreased activity tolerance;Impaired balance (sitting and/or standing);Decreased knowledge of use of DME or AE;Decreased knowledge of precautions;Cardiopulmonary status limiting activity   OT Treatment/Interventions: Self-care/ADL training;Energy conservation;DME and/or AE instruction;Therapeutic activities;Patient/family education    OT Goals(Current goals can be found in the care plan section) Acute Rehab OT Goals Patient Stated Goal: none stated OT Goal Formulation: With patient Time For Goal Achievement: 06/14/15 Potential to Achieve Goals: Good  OT Frequency: Min 2X/week   Barriers to D/C:            Co-evaluation              End of Session Equipment Utilized During Treatment: Right knee immobilizer  Activity Tolerance: Patient limited by fatigue Patient left: in chair;with call bell/phone within reach;with chair alarm set;with family/visitor present   Time: [id number] OT Time Calculation (min): 12 min Charges:  OT General Charges $OT Visit: 1 Procedure OT Evaluation $OT Eval Low Complexity: 1 Procedure G-Codes: OT G-codes **NOT FOR INPATIENT CLASS** Functional Assessment Tool Used: clinical judgment Functional Limitation: Self care Self Care Current Status [id number]): At least 40 percent but less than 60 percent impaired, limited or restricted Self Care Goal Status [id number]): At least 1 percent but less than 20 percent impaired, limited or restricted  Danielle [name] A 05/31/2015, 1:57 PM

## 2015-05-31 NOTE — Procedures (Signed)
Placed patient on CPAP 5cmH20 for the night.  Patient is tolerating well at this time.

## 2015-05-31 NOTE — Evaluation (Signed)
Physical Therapy Evaluation Patient Details Name: Danielle [name] MRN: [id number] DOB: 19-Dec-1945 Today's Date: 05/31/2015   History of Present Illness  70 y.o. female with medical history significant of hypertension, hyperlipidemia, GERD, hypothyroidism, depression, anxiety, OSA on CPAP, history of difficult intubation, recent right tibial plateau fracture (did not have surgery) and admitted for acute respiratory failure  Clinical Impression  Pt admitted with above diagnosis. Pt currently with functional limitations due to the deficits listed below (see PT Problem List).  Pt will benefit from skilled PT to increase their independence and safety with mobility to allow discharge to the venue listed below.   Pt assisted to [facility] and then recliner.  Pt reports DOE and SOB with pivot to [facility].  Pt states she has been doing well with mobility at home since recent tibial plateau fx - has RW, w/c, scooter.     Follow Up Recommendations No PT follow up    Equipment Recommendations  None recommended by PT    Recommendations for Other Services       Precautions / Restrictions Precautions Precautions: Fall Required Braces or Orthoses: Knee Immobilizer - Right Restrictions Weight Bearing Restrictions: Yes RLE Weight Bearing: Non weight bearing      Mobility  Bed Mobility Overal bed mobility: Needs Assistance Bed Mobility: Supine to Sit     Supine to sit: Min assist;HOB elevated     General bed mobility comments: assist for R LE per pt request  Transfers Overall transfer level: Needs assistance Equipment used: Rolling walker (2 wheeled) Transfers: Sit to/from Stand Sit to Stand: Min guard         General transfer comment: verbal cues for UE positioning, maintains NWB well, pivoted to [facility] with RW with pt briefly used, better pivoting with armrests, pivoted back to bed and then to recliner, upon first pivot pt with increased WOB and feeling SOB so applied 2L O2 [place] (O2 line had been  connected to bipap machine) (RN aware)  Ambulation/Gait                Stairs            Wheelchair Mobility    Modified Rankin (Stroke Patients Only)       Balance                                             Pertinent Vitals/Pain Pain Assessment: No/denies pain    Home Living Family/patient expects to be discharged to:: Private residence Living Arrangements: Spouse/significant other Available Help at Discharge: Family Type of Home: House Home Access: Stairs to enter   Technical brewer of Steps: 1 Home Layout: Able to live on main level with bedroom/bathroom Home Equipment: Environmental consultant - 2 wheels;Bedside commode;Wheelchair - Press photographer      Prior Function Level of Independence: Independent with assistive device(s)               Hand Dominance        Extremity/Trunk Assessment               Lower Extremity Assessment: RLE deficits/detail RLE Deficits / Details: remained in [place]       Communication   Communication: No difficulties  Cognition Arousal/Alertness: Awake/alert Behavior During Therapy: WFL for tasks assessed/performed Overall Cognitive Status: Within Functional Limits for tasks assessed  General Comments      Exercises        Assessment/Plan    PT Assessment Patient needs continued PT services  PT Diagnosis Difficulty walking   PT Problem List Decreased strength;Decreased range of motion;Decreased activity tolerance;Decreased mobility;Decreased knowledge of use of DME;Decreased knowledge of precautions;Cardiopulmonary status limiting activity  PT Treatment Interventions DME instruction;Gait training;Stair training;Functional mobility training;Therapeutic activities;Therapeutic exercise;Balance training;Patient/family education;Wheelchair mobility training   PT Goals (Current goals can be found in the Care Plan section) Acute Rehab PT Goals PT Goal  Formulation: With patient Time For Goal Achievement: 06/07/15 Potential to Achieve Goals: Good    Frequency Min 3X/week   Barriers to discharge        Co-evaluation               End of Session Equipment Utilized During Treatment: Right knee immobilizer;Oxygen Activity Tolerance: Patient tolerated treatment well Patient left: with call bell/phone within reach;in chair;with nursing/sitter in room      Functional Assessment Tool Used: Clinical judgement Functional Limitation: Mobility: Walking and moving around Mobility: Walking and Moving Around Current Status [id number]): At least 20 percent but less than 40 percent impaired, limited or restricted Mobility: Walking and Moving Around Goal Status [phone]): At least 1 percent but less than 20 percent impaired, limited or restricted    Time: 1025-1100 PT Time Calculation (min) (ACUTE ONLY): 35 min   Charges:   PT Evaluation $PT Eval Low Complexity: 1 Procedure     PT G Codes:   PT G-Codes **NOT FOR INPATIENT CLASS** Functional Assessment Tool Used: Clinical judgement Functional Limitation: Mobility: Walking and moving around Mobility: Walking and Moving Around Current Status [id number]): At least 20 percent but less than 40 percent impaired, limited or restricted Mobility: Walking and Moving Around Goal Status [phone]): At least 1 percent but less than 20 percent impaired, limited or restricted    Ardine Iacovelli,KATHrine E 05/31/2015, 12:43 PM Carmelia Bake, PT, DPT 05/31/2015 Pager: [phone]

## 2015-05-31 NOTE — Procedures (Signed)
Patient refused CPAP for tonight.  She stated she did not tolerate it well last night.  Patient aware if she changes her mind to ask RN to call Respiratory.

## 2015-05-31 NOTE — Progress Notes (Signed)
CRITICAL VALUE ALERT  Critical value received:  Lactic acid 2.1  Date of notification:  0115  Time of notification:  0001  Critical value read back:Yes.    Nurse who received alert:  J.Anastasios Melander  MD notified (1st page):  K.Kirby  Time of first page:  0120  MD notified (2nd page):  Time of second page:  Responding MD:  X.NIU  Time MD responded:  [id number]

## 2015-05-31 NOTE — Progress Notes (Signed)
PROGRESS NOTE    Danielle [name]  [id number] DOB: Jun 10, 1945 DOA: 05/30/2015 PCP: Lottie Dawson, MD  Brief Narrative: Danielle [name] is a 70 y.o. female with medical history significant of hypertension, hyperlipidemia, GERD, hypothyroidism, depression, anxiety, OSA on CPAP, history of difficult intubation, recent right tibial plateau fracture (did not have surgery), who presented with shortness of breath and generalized weakness. Pt stated that she was admitted to the hospital from 5/4-5/5 due to right tibial plateau fracture secondary to mechanical fall. She did not require surgery and has brace in right leg. Pt reported that in the past 3 days, she has been having shortness of breath and dry cough, aggravated by exertion  Assessment & Plan:   Acute respiratory failure with hypoxia ([place]): - this is likely due to atypical infection - BNP is normal, but she is obese, hence could be falsely low, trace edema in legs and sacrum noted -stop IVF, IV lasix -low dose today, check ECHO -continue IV levaquin -lactate normalized, procalcitonin <0.10 -CTA negative for PE  Recent Tibial plateau fracture: CT-scan on 05/16/15 showed longitudinal/vertical fractures involving the lateral tibial plateau with minimal depression posteriorly estimated at 3 mm. She was managed by ortho, Dr.Beane, did not require surgery. Has brace on. -PT/OT, pain control -lovenox for DVT prophylaxis  HTN: -continue atenolol   OSA: -CPAP  HLD: -Continue Crestor  GERD: -Protonix  Hypothyroidism:  -TSH up, will increase synthroid dose to 170mcg  Depression and anxiety: . -Continue PRN Xanax, Cymbalta  DVT ppx: SQ Lovenox  Code Status: Full code Family Communication: None at bed side Disposition Plan: Home in few days  Antimicrobials: levaquin  Subjective: Still with some dyspnea Objective: Filed Vitals:   05/30/15 2353 05/31/15 0621 05/31/15 1046 05/31/15 1259  BP:  129/50 107/57  102/54  Pulse: 105 106  92  Temp:  97.5 F (36.4 C)  98.1 F (36.7 C)  TempSrc:  Oral  Oral  Resp: 20 20  20   Height:      Weight:      SpO2: 97% 95%  93%    Intake/Output Summary (Last 24 hours) at 05/31/15 1327 Last data filed at 05/31/15 1259  Gross per 24 hour  Intake   2116 ml  Output   1376 ml  Net    740 ml   Filed Weights   05/30/15 1634 05/30/15 2248  Weight: 90.719 kg (200 lb) 102.3 kg (225 lb 8.5 oz)    Examination:  General exam: Appears calm and comfortable, no distress Respiratory system: few basilar ronchi, Respiratory effort normal. Cardiovascular system: S1 & S2 heard, RRR. No JVD, murmurs, rubs, gallops or clicks. No pedal edema. Gastrointestinal system: Abdomen is nondistended, soft and nontender. No organomegaly or masses felt. Normal bowel sounds heard. Central nervous system: Alert and oriented. No focal neurological deficits. Extremities: R leg immobilized Skin: No rashes, lesions or ulcers Psychiatry: Judgement and insight appear normal. Mood & affect appropriate.     Data Reviewed: I have personally reviewed following labs and imaging studies  CBC:  Recent Labs Lab 05/30/15 1730  WBC 6.5  HGB 12.6  HCT 38.0  MCV 91.6  PLT [id number]   Basic Metabolic Panel:  Recent Labs Lab 05/30/15 1730  NA 138  K 4.0  CL 102  CO2 28  GLUCOSE 159*  BUN 13  CREATININE 0.91  CALCIUM 9.4   GFR: Estimated Creatinine Clearance: 66.7 mL/min (by C-G formula based on Cr of 0.91). Liver Function Tests: No results for  input(s): AST, ALT, ALKPHOS, BILITOT, PROT, ALBUMIN in the last 168 hours. No results for input(s): LIPASE, AMYLASE in the last 168 hours. No results for input(s): AMMONIA in the last 168 hours. Coagulation Profile:  Recent Labs Lab 05/30/15 1730  INR 1.06   Cardiac Enzymes:  Recent Labs Lab 05/30/15 1742  TROPONINI <0.03   BNP (last 3 results) No results for input(s): PROBNP in the last 8760 hours. HbA1C: No results for  input(s): HGBA1C in the last 72 hours. CBG: No results for input(s): GLUCAP in the last 168 hours. Lipid Profile:  Recent Labs  05/31/15 0509  CHOL 148  HDL 43  LDLCALC 76  TRIG 145  CHOLHDL 3.4   Thyroid Function Tests:  Recent Labs  05/31/15 0509  TSH 5.024*   Anemia Panel: No results for input(s): VITAMINB12, FOLATE, FERRITIN, TIBC, IRON, RETICCTPCT in the last 72 hours. Urine analysis:    Component Value Date/Time   COLORURINE YELLOW 05/30/2015 1744   APPEARANCEUR CLOUDY* 05/30/2015 1744   LABSPEC 1.019 05/30/2015 1744   PHURINE 6.0 05/30/2015 1744   GLUCOSEU NEGATIVE 05/30/2015 1744   HGBUR NEGATIVE 05/30/2015 1744   BILIRUBINUR NEGATIVE 05/30/2015 1744   [place] 05/30/2015 1744   PROTEINUR NEGATIVE 05/30/2015 1744   UROBILINOGEN 0.2 02/13/2011 1018   NITRITE NEGATIVE 05/30/2015 1744   LEUKOCYTESUR MODERATE* 05/30/2015 1744   Sepsis Labs: @LABRCNTIP (procalcitonin:4,lacticidven:4)  ) Recent Results (from the past 240 hour(s))  Respiratory Panel by PCR     Status: None   Collection Time: 05/30/15 11:02 PM  Result Value Ref Range Status   Adenovirus NOT DETECTED NOT DETECTED Final   Coronavirus 229E NOT DETECTED NOT DETECTED Final   Coronavirus HKU1 NOT DETECTED NOT DETECTED Final   Coronavirus NL63 NOT DETECTED NOT DETECTED Final   Coronavirus OC43 NOT DETECTED NOT DETECTED Final   Metapneumovirus NOT DETECTED NOT DETECTED Final   Rhinovirus / Enterovirus NOT DETECTED NOT DETECTED Final   Influenza A NOT DETECTED NOT DETECTED Final   Influenza A H1 NOT DETECTED NOT DETECTED Final   Influenza A H1 2009 NOT DETECTED NOT DETECTED Final   Influenza A H3 NOT DETECTED NOT DETECTED Final   Influenza B NOT DETECTED NOT DETECTED Final   Parainfluenza Virus 1 NOT DETECTED NOT DETECTED Final   Parainfluenza Virus 2 NOT DETECTED NOT DETECTED Final   Parainfluenza Virus 3 NOT DETECTED NOT DETECTED Final   Parainfluenza Virus 4 NOT DETECTED NOT DETECTED  Final   Respiratory Syncytial Virus NOT DETECTED NOT DETECTED Final   Bordetella pertussis NOT DETECTED NOT DETECTED Final   Chlamydophila pneumoniae NOT DETECTED NOT DETECTED Final   Mycoplasma pneumoniae NOT DETECTED NOT DETECTED Final         Radiology Studies: Dg Chest 2 View  05/30/2015  CLINICAL DATA:  Shortness of breath and dry cough for 1 week. History of hypertension. EXAM: CHEST  2 VIEW COMPARISON:  Chest x-ray dated 02/13/2011. FINDINGS: Study is hypoinspiratory with crowding of the perihilar and bibasilar bronchovascular markings. Given the low lung volumes, lungs appear clear. No confluent opacity to suggest a developing pneumonia. No pleural effusion or pneumothorax seen. Heart size is within normal limits. Overall cardiomediastinal silhouette is stable in size and configuration. Anterior cervical fusion hardware appears stable in position. No acute osseous abnormality seen. IMPRESSION: Low lung volumes. No evidence of acute cardiopulmonary abnormality. No pneumonia seen. Electronically Signed   By: Franki Cabot M.D.   On: 05/30/2015 17:34   Ct Angio Chest Pe W/cm &/or  Wo Cm  05/30/2015  CLINICAL DATA:  Shortness of breath and dyspnea with exertion. Tibial plateau fracture 2 weeks ago. EXAM: CT ANGIOGRAPHY CHEST WITH CONTRAST TECHNIQUE: Multidetector CT imaging of the chest was performed using the standard protocol during bolus administration of intravenous contrast. Multiplanar CT image reconstructions and MIPs were obtained to evaluate the vascular anatomy. CONTRAST:  65 cc Isovue 370 COMPARISON:  None. FINDINGS: Mediastinum/Lymph Nodes: Some of the most peripheral segmental and subsegmental pulmonary artery branches are difficult to definitively characterize due to patient breathing motion artifact. There is no pulmonary embolism identified within the main, lobar or central segmental pulmonary arteries. Mild cardiomegaly. No pericardial effusion. Scattered atherosclerotic changes  are seen along the walls of the normal-caliber thoracic aorta. No aortic aneurysm or dissection. No mass or enlarged lymph nodes seen within the mediastinum. Trachea and central bronchi are unremarkable. Lungs/Pleura: Small patchy consolidations are seen throughout both lungs, some ground-glass density. No pleural effusion or pneumothorax. Upper abdomen: No acute findings.  Status post cholecystectomy. Musculoskeletal: Anterior cervical fusion hardware partially imaged in the lower cervical spine. Mild degenerative spurring throughout the thoracic spine. No acute - appearing osseous abnormality. Review of the MIP images confirms the above findings. IMPRESSION: 1. No pulmonary embolism seen, with mild study limitations detailed above. 2. Mild cardiomegaly. 3. No aortic aneurysm or dissection. 4. Patchy small consolidations scattered throughout both lungs, some of which are ground-glass density. The consolidations at each lung base are most likely atelectasis, less likely pneumonia or aspiration. Differential for the remainder of the scattered small consolidations (most being ground-glass density) includes atypical pneumonias such as viral or fungal, interstitial pneumonias, edema related to volume overload/CHF, chronic interstitial diseases, hypersensitivity pneumonitis, and respiratory bronchiolitis. Electronically Signed   By: Franki Cabot M.D.   On: 05/30/2015 19:07        Scheduled Meds: . atenolol  25 mg Oral Daily  . calcium-vitamin D  1 tablet Oral BID  . cyanocobalamin  500 mcg Oral Daily  . dextromethorphan-guaiFENesin  1 tablet Oral BID  . DULoxetine  60 mg Oral Daily  . enoxaparin (LOVENOX) injection  40 mg Subcutaneous QHS  . furosemide  20 mg Intravenous Q12H  . levofloxacin (LEVAQUIN) IV  750 mg Intravenous QHS  . levothyroxine  112 mcg Oral QAC breakfast  . loratadine  10 mg Oral Daily  . pantoprazole  40 mg Oral Daily  . potassium chloride  40 mEq Oral Daily  . rosuvastatin  20 mg  Oral Daily   Continuous Infusions:       Time spent: 109min    Domenic Polite, MD [facility] Pager [phone]  If 7PM-7AM, please contact night-coverage [email or web address] Password [email or web address] 05/31/2015, 1:27 PM

## 2015-06-01 ENCOUNTER — Inpatient Hospital Stay (HOSPITAL_COMMUNITY): Payer: Medicare Other

## 2015-06-01 DIAGNOSIS — I340 Nonrheumatic mitral (valve) insufficiency: Secondary | ICD-10-CM

## 2015-06-01 DIAGNOSIS — R0600 Dyspnea, unspecified: Secondary | ICD-10-CM

## 2015-06-01 HISTORY — DX: Nonrheumatic mitral (valve) insufficiency: I34.0

## 2015-06-01 LAB — CBC
HEMATOCRIT: 32.4 % — AB (ref 36.0–46.0)
Hemoglobin: 10.6 g/dL — ABNORMAL LOW (ref 12.0–15.0)
MCH: 30.5 pg (ref 26.0–34.0)
MCHC: 32.7 g/dL (ref 30.0–36.0)
MCV: 93.4 fL (ref 78.0–100.0)
PLATELETS: 325 10*3/uL (ref 150–400)
RBC: 3.47 MIL/uL — ABNORMAL LOW (ref 3.87–5.11)
RDW: 14.1 % (ref 11.5–15.5)
WBC: 5.5 10*3/uL (ref 4.0–10.5)

## 2015-06-01 LAB — HIV ANTIBODY (ROUTINE TESTING W REFLEX): HIV SCREEN 4TH GENERATION: NONREACTIVE

## 2015-06-01 LAB — BASIC METABOLIC PANEL
ANION GAP: 4 — AB (ref 5–15)
BUN: 12 mg/dL (ref 6–20)
CALCIUM: 8.7 mg/dL — AB (ref 8.9–10.3)
CO2: 29 mmol/L (ref 22–32)
CREATININE: 0.98 mg/dL (ref 0.44–1.00)
Chloride: 107 mmol/L (ref 101–111)
GFR calc Af Amer: >60 mL/min (ref >60)
GFR, EST NON AFRICAN AMERICAN: 58 mL/min — AB (ref >60)
GLUCOSE: 128 mg/dL — AB (ref 65–99)
Potassium: 3.6 mmol/L (ref 3.5–5.1)
Sodium: 140 mmol/L (ref 135–145)

## 2015-06-01 LAB — ECHOCARDIOGRAM COMPLETE
Height: 63 in
WEIGHTICAEL: 3608.49 [oz_av]

## 2015-06-01 LAB — URINE CULTURE

## 2015-06-01 MED ORDER — FUROSEMIDE 40 MG PO TABS
40.0000 mg | ORAL_TABLET | Freq: Every day | ORAL | Status: AC
  Administered 2015-06-01: 40 mg via ORAL
  Filled 2015-06-01: qty 1

## 2015-06-01 MED ORDER — IBUPROFEN 200 MG PO TABS
400.0000 mg | ORAL_TABLET | ORAL | Status: DC | PRN
  Administered 2015-06-02 (×2): 400 mg via ORAL
  Filled 2015-06-01 (×2): qty 2

## 2015-06-01 MED ORDER — PERFLUTREN LIPID MICROSPHERE
1.0000 mL | INTRAVENOUS | Status: AC | PRN
  Administered 2015-06-01: 2 mL via INTRAVENOUS
  Filled 2015-06-01: qty 10

## 2015-06-01 MED ORDER — POTASSIUM CHLORIDE CRYS ER 20 MEQ PO TBCR
40.0000 meq | EXTENDED_RELEASE_TABLET | Freq: Two times a day (BID) | ORAL | Status: AC
  Administered 2015-06-01 (×2): 40 meq via ORAL
  Filled 2015-06-01: qty 2

## 2015-06-01 NOTE — Progress Notes (Signed)
  Echocardiogram 2D Echocardiogram with Defnity 3mL has been performed.  Darlina Sicilian M 06/01/2015, 10:08 AM

## 2015-06-01 NOTE — Progress Notes (Signed)
Pt refused CPAP.  States that she was unable to tolerate our CPAP last night.  Pt will have someone bring her CPAP from home tomorrow.

## 2015-06-01 NOTE — Progress Notes (Signed)
PROGRESS NOTE    Danielle [name]  [id number] DOB: 1945/11/19 DOA: 05/30/2015 PCP: Lottie Dawson, MD  Brief Narrative: Danielle [name] is a 70 y.o. female with medical history significant of hypertension, hyperlipidemia, GERD, hypothyroidism, depression, anxiety, OSA on CPAP, history of difficult intubation, recent right tibial plateau fracture (did not have surgery), who presented with shortness of breath and generalized weakness. Pt stated that she was admitted to the hospital from 5/4-5/5 due to right tibial plateau fracture secondary to mechanical fall. She did not require surgery and has brace in right leg. Pt reported that in the past 3 days, she has been having shortness of breath and dry cough, aggravated by exertion  Assessment & Plan:   Acute respiratory failure with hypoxia ([place]): - this is likely due to atypical infection and mild diastolic CHF - BNP is normal, but she is obese, hence could be falsely low, trace edema in legs and sacrum noted -improved with Abx and dose of IV lasix -FU ECHO, PO lasix today -continue IV levaquin -lactate normalized, procalcitonin <0.10 -CTA negative for PE  Recent Tibial plateau fracture: CT-scan on 05/16/15 showed longitudinal/vertical fractures involving the lateral tibial plateau with minimal depression posteriorly estimated at 3 mm. She was managed by ortho, Dr.Beane, did not require surgery. Has brace on. -PT/OT, pain control -lovenox for DVT prophylaxis  HTN: -continue atenolol   OSA: -CPAP  HLD: -Continue Crestor  GERD: -Protonix  Hypothyroidism:  -TSH up, increased synthroid dose to 117mcg  Depression and anxiety: . -Continue PRN Xanax, Cymbalta  DVT ppx: SQ Lovenox  Code Status: Full code Family Communication: None at bed side Disposition Plan: Home ? Tomorrow if stable  Antimicrobials: levaquin  Subjective: Breathing better  Objective: Filed Vitals:   05/31/15 1259 05/31/15 2112 06/01/15  0555 06/01/15 1028  BP: 102/54 116/49 100/65   Pulse: 92 90 83   Temp: 98.1 F (36.7 C) 98.4 F (36.9 C) 97.9 F (36.6 C)   TempSrc: Oral Oral Oral   Resp: 20 20 22    Height:      Weight:      SpO2: 93% 100% 99% 96%    Intake/Output Summary (Last 24 hours) at 06/01/15 1244 Last data filed at 05/31/15 1655  Gross per 24 hour  Intake    120 ml  Output   1050 ml  Net   -930 ml   Filed Weights   05/30/15 1634 05/30/15 2248  Weight: 90.719 kg (200 lb) 102.3 kg (225 lb 8.5 oz)    Examination:  General exam: Appears calm and comfortable, no distress Respiratory system: rare basilar ronchi, Respiratory effort normal. Cardiovascular system: S1 & S2 heard, RRR. No JVD, murmurs, rubs, gallops or clicks. No pedal edema. Gastrointestinal system: Abdomen is nondistended, soft and nontender. No organomegaly or masses felt. Normal bowel sounds heard. Central nervous system: Alert and oriented. No focal neurological deficits. Extremities: R leg immobilized Skin: No rashes, lesions or ulcers Psychiatry: Judgement and insight appear normal. Mood & affect appropriate.     Data Reviewed: I have personally reviewed following labs and imaging studies  CBC:  Recent Labs Lab 05/30/15 1730 06/01/15 0530  WBC 6.5 5.5  HGB 12.6 10.6*  HCT 38.0 32.4*  MCV 91.6 93.4  PLT 358 [id number]   Basic Metabolic Panel:  Recent Labs Lab 05/30/15 1730 06/01/15 0530  NA 138 140  K 4.0 3.6  CL 102 107  CO2 28 29  GLUCOSE 159* 128*  BUN 13 12  CREATININE 0.91  0.98  CALCIUM 9.4 8.7*   GFR: Estimated Creatinine Clearance: 61.9 mL/min (by C-G formula based on Cr of 0.98). Liver Function Tests: No results for input(s): AST, ALT, ALKPHOS, BILITOT, PROT, ALBUMIN in the last 168 hours. No results for input(s): LIPASE, AMYLASE in the last 168 hours. No results for input(s): AMMONIA in the last 168 hours. Coagulation Profile:  Recent Labs Lab 05/30/15 1730  INR 1.06   Cardiac Enzymes:  Recent  Labs Lab 05/30/15 1742  TROPONINI <0.03   BNP (last 3 results) No results for input(s): PROBNP in the last 8760 hours. HbA1C: No results for input(s): HGBA1C in the last 72 hours. CBG:  Recent Labs Lab 05/31/15 2115  GLUCAP 126*   Lipid Profile:  Recent Labs  05/31/15 0509  CHOL 148  HDL 43  LDLCALC 76  TRIG 145  CHOLHDL 3.4   Thyroid Function Tests:  Recent Labs  05/31/15 0509  TSH 5.024*   Anemia Panel: No results for input(s): VITAMINB12, FOLATE, FERRITIN, TIBC, IRON, RETICCTPCT in the last 72 hours. Urine analysis:    Component Value Date/Time   COLORURINE YELLOW 05/30/2015 1744   APPEARANCEUR CLOUDY* 05/30/2015 1744   LABSPEC 1.019 05/30/2015 1744   PHURINE 6.0 05/30/2015 1744   GLUCOSEU NEGATIVE 05/30/2015 1744   HGBUR NEGATIVE 05/30/2015 1744   [place] 05/30/2015 1744   [place] 05/30/2015 1744   PROTEINUR NEGATIVE 05/30/2015 1744   UROBILINOGEN 0.2 02/13/2011 1018   NITRITE NEGATIVE 05/30/2015 1744   LEUKOCYTESUR MODERATE* 05/30/2015 1744   Sepsis Labs: @LABRCNTIP (procalcitonin:4,lacticidven:4)  ) Recent Results (from the past 240 hour(s))  Culture, blood (Routine X 2) w Reflex to ID Panel     Status: None (Preliminary result)   Collection Time: 05/30/15  8:23 PM  Result Value Ref Range Status   Specimen Description RIGHT ANTECUBITAL  Final   Special Requests BOTTLES DRAWN AEROBIC AND ANAEROBIC 5CC  Final   Culture   Final    NO GROWTH 1 DAY Performed at [facility]    Report Status PENDING  Incomplete  Respiratory Panel by PCR     Status: None   Collection Time: 05/30/15 11:02 PM  Result Value Ref Range Status   Adenovirus NOT DETECTED NOT DETECTED Final   Coronavirus 229E NOT DETECTED NOT DETECTED Final   Coronavirus HKU1 NOT DETECTED NOT DETECTED Final   Coronavirus NL63 NOT DETECTED NOT DETECTED Final   Coronavirus OC43 NOT DETECTED NOT DETECTED Final   Metapneumovirus NOT DETECTED NOT DETECTED Final    Rhinovirus / Enterovirus NOT DETECTED NOT DETECTED Final   Influenza A NOT DETECTED NOT DETECTED Final   Influenza A H1 NOT DETECTED NOT DETECTED Final   Influenza A H1 2009 NOT DETECTED NOT DETECTED Final   Influenza A H3 NOT DETECTED NOT DETECTED Final   Influenza B NOT DETECTED NOT DETECTED Final   Parainfluenza Virus 1 NOT DETECTED NOT DETECTED Final   Parainfluenza Virus 2 NOT DETECTED NOT DETECTED Final   Parainfluenza Virus 3 NOT DETECTED NOT DETECTED Final   Parainfluenza Virus 4 NOT DETECTED NOT DETECTED Final   Respiratory Syncytial Virus NOT DETECTED NOT DETECTED Final   Bordetella pertussis NOT DETECTED NOT DETECTED Final   Chlamydophila pneumoniae NOT DETECTED NOT DETECTED Final   Mycoplasma pneumoniae NOT DETECTED NOT DETECTED Final  Urine culture     Status: Abnormal   Collection Time: 05/30/15 11:02 PM  Result Value Ref Range Status   Specimen Description URINE, CLEAN CATCH  Final   Special  Requests NONE  Final   Culture MULTIPLE SPECIES PRESENT, SUGGEST RECOLLECTION (A)  Final   Report Status 06/01/2015 FINAL  Final  Culture, blood (Routine X 2) w Reflex to ID Panel     Status: None (Preliminary result)   Collection Time: 05/31/15  5:09 AM  Result Value Ref Range Status   Specimen Description BLOOD LEFT ANTECUBITAL  Final   Special Requests BOTTLES DRAWN AEROBIC ONLY 4ML  Final   Culture   Final    NO GROWTH 1 DAY Performed at [facility]    Report Status PENDING  Incomplete         Radiology Studies: Dg Chest 2 View  05/30/2015  CLINICAL DATA:  Shortness of breath and dry cough for 1 week. History of hypertension. EXAM: CHEST  2 VIEW COMPARISON:  Chest x-ray dated 02/13/2011. FINDINGS: Study is hypoinspiratory with crowding of the perihilar and bibasilar bronchovascular markings. Given the low lung volumes, lungs appear clear. No confluent opacity to suggest a developing pneumonia. No pleural effusion or pneumothorax seen. Heart size is within  normal limits. Overall cardiomediastinal silhouette is stable in size and configuration. Anterior cervical fusion hardware appears stable in position. No acute osseous abnormality seen. IMPRESSION: Low lung volumes. No evidence of acute cardiopulmonary abnormality. No pneumonia seen. Electronically Signed   By: Franki Cabot M.D.   On: 05/30/2015 17:34   Ct Angio Chest Pe W/cm &/or Wo Cm  05/30/2015  CLINICAL DATA:  Shortness of breath and dyspnea with exertion. Tibial plateau fracture 2 weeks ago. EXAM: CT ANGIOGRAPHY CHEST WITH CONTRAST TECHNIQUE: Multidetector CT imaging of the chest was performed using the standard protocol during bolus administration of intravenous contrast. Multiplanar CT image reconstructions and MIPs were obtained to evaluate the vascular anatomy. CONTRAST:  65 cc Isovue 370 COMPARISON:  None. FINDINGS: Mediastinum/Lymph Nodes: Some of the most peripheral segmental and subsegmental pulmonary artery branches are difficult to definitively characterize due to patient breathing motion artifact. There is no pulmonary embolism identified within the main, lobar or central segmental pulmonary arteries. Mild cardiomegaly. No pericardial effusion. Scattered atherosclerotic changes are seen along the walls of the normal-caliber thoracic aorta. No aortic aneurysm or dissection. No mass or enlarged lymph nodes seen within the mediastinum. Trachea and central bronchi are unremarkable. Lungs/Pleura: Small patchy consolidations are seen throughout both lungs, some ground-glass density. No pleural effusion or pneumothorax. Upper abdomen: No acute findings.  Status post cholecystectomy. Musculoskeletal: Anterior cervical fusion hardware partially imaged in the lower cervical spine. Mild degenerative spurring throughout the thoracic spine. No acute - appearing osseous abnormality. Review of the MIP images confirms the above findings. IMPRESSION: 1. No pulmonary embolism seen, with mild study limitations  detailed above. 2. Mild cardiomegaly. 3. No aortic aneurysm or dissection. 4. Patchy small consolidations scattered throughout both lungs, some of which are ground-glass density. The consolidations at each lung base are most likely atelectasis, less likely pneumonia or aspiration. Differential for the remainder of the scattered small consolidations (most being ground-glass density) includes atypical pneumonias such as viral or fungal, interstitial pneumonias, edema related to volume overload/CHF, chronic interstitial diseases, hypersensitivity pneumonitis, and respiratory bronchiolitis. Electronically Signed   By: Franki Cabot M.D.   On: 05/30/2015 19:07        Scheduled Meds: . atenolol  25 mg Oral Daily  . calcium-vitamin D  1 tablet Oral BID  . cyanocobalamin  500 mcg Oral Daily  . dextromethorphan-guaiFENesin  1 tablet Oral BID  . DULoxetine  60 mg Oral  Daily  . enoxaparin (LOVENOX) injection  40 mg Subcutaneous QHS  . levofloxacin (LEVAQUIN) IV  750 mg Intravenous QHS  . levothyroxine  125 mcg Oral QAC breakfast  . loratadine  10 mg Oral Daily  . pantoprazole  40 mg Oral Daily  . potassium chloride  40 mEq Oral BID  . rosuvastatin  20 mg Oral Daily   Continuous Infusions:    LOS: 1 day    Time spent: 29min    Domenic Polite, MD [facility] Pager [phone]  If 7PM-7AM, please contact night-coverage [email or web address] Password [facility] 06/01/2015, 12:44 PM

## 2015-06-02 LAB — BASIC METABOLIC PANEL
ANION GAP: 7 (ref 5–15)
BUN: 12 mg/dL (ref 6–20)
CHLORIDE: 107 mmol/L (ref 101–111)
CO2: 25 mmol/L (ref 22–32)
CREATININE: 0.92 mg/dL (ref 0.44–1.00)
Calcium: 8.7 mg/dL — ABNORMAL LOW (ref 8.9–10.3)
GFR calc non Af Amer: >60 mL/min (ref >60)
Glucose, Bld: 132 mg/dL — ABNORMAL HIGH (ref 65–99)
Potassium: 4.2 mmol/L (ref 3.5–5.1)
SODIUM: 139 mmol/L (ref 135–145)

## 2015-06-02 LAB — CBC
HEMATOCRIT: 32.5 % — AB (ref 36.0–46.0)
HEMOGLOBIN: 10.7 g/dL — AB (ref 12.0–15.0)
MCH: 29.4 pg (ref 26.0–34.0)
MCHC: 32.9 g/dL (ref 30.0–36.0)
MCV: 89.3 fL (ref 78.0–100.0)
Platelets: 348 10*3/uL (ref 150–400)
RBC: 3.64 MIL/uL — AB (ref 3.87–5.11)
RDW: 13.7 % (ref 11.5–15.5)
WBC: 5.5 10*3/uL (ref 4.0–10.5)

## 2015-06-02 LAB — LEGIONELLA PNEUMOPHILA SEROGP 1 UR AG: L. pneumophila Serogp 1 Ur Ag: NEGATIVE

## 2015-06-02 MED ORDER — SENNOSIDES-DOCUSATE SODIUM 8.6-50 MG PO TABS: 1.0000 | ORAL_TABLET | Freq: Two times a day (BID) | ORAL | Status: DC | PRN

## 2015-06-02 MED ORDER — LEVOFLOXACIN 500 MG PO TABS: 500.0000 mg | ORAL_TABLET | Freq: Every day | ORAL | Status: DC

## 2015-06-02 MED ORDER — LEVOFLOXACIN 500 MG PO TABS
500.0000 mg | ORAL_TABLET | Freq: Every day | ORAL | Status: DC
  Administered 2015-06-02: 500 mg via ORAL
  Filled 2015-06-02: qty 1

## 2015-06-02 NOTE — Progress Notes (Signed)
SATURATION QUALIFICATIONS: (This note is used to comply with regulatory documentation for home oxygen)  Patient Saturations on Room Air at Rest = 86%  Patient Saturations on 2 Liters of oxygen while Ambulating = 96%  Please briefly explain why patient needs home oxygen:

## 2015-06-02 NOTE — Progress Notes (Signed)
Patient does not have a home CPAP machine in her room and she states that no one brought it to her today. However, the hospital CPAP and equipment remain at her bedside for use, but she continues to decline. RT will continue to follow.

## 2015-06-02 NOTE — Progress Notes (Signed)
PROGRESS NOTE    PRESTYN [name]  [id number] DOB: [birth year] DOA: 05/30/2015 PCP: Lottie Dawson, MD  Brief Narrative: Danielle [name] is a 70 y.o. female with medical history significant of hypertension, hyperlipidemia, GERD, hypothyroidism, depression, anxiety, OSA on CPAP, history of difficult intubation, recent right tibial plateau fracture (did not have surgery), who presented with shortness of breath and generalized weakness. Pt stated that she was admitted to the hospital from 5/4-5/5 due to right tibial plateau fracture secondary to mechanical fall. She did not require surgery and has brace in right leg. Pt reported that in the past 3 days, she has been having shortness of breath and dry cough, aggravated by exertion  Assessment & Plan:   Acute respiratory failure with hypoxia ([place]): - this is likely due to atypical infection and mild diastolic CHF - BNP is normal, but she is obese, hence could be falsely low, trace edema in legs and sacrum noted -improved with Abx and dose of IV lasix -2D ECHO with normal EF and grade 1DD, stop lasix now since BP soft, resume HCTZ at discharge -change IV levaquin to PO -lactate normalized, procalcitonin <0.10 -CTA negative for PE  Recent Tibial plateau fracture: CT-scan on 05/16/15 showed longitudinal/vertical fractures involving the lateral tibial plateau with minimal depression posteriorly estimated at 3 mm. She was managed by ortho, Dr.Beane, did not require surgery. Has brace on. -PT/OT, pain control -lovenox for DVT prophylaxis  HTN: -continue atenolol   OSA: -CPAP  HLD: -Continue Crestor  GERD: -Protonix  Hypothyroidism:  -TSH up, increased synthroid dose to 152mcg  Depression and anxiety: . -Continue PRN Xanax, Cymbalta  DVT ppx: SQ Lovenox  Code Status: Full code Family Communication: None at bed side Disposition Plan: Home ? Tomorrow if stable  Antimicrobials: levaquin  Subjective: Breathing  better, didn't get much sleep last pm  Objective: Filed Vitals:   06/02/15 0500 06/02/15 0918 06/02/15 0919 06/02/15 1009  BP: 94/48   106/50  Pulse: 103   93  Temp: 98.7 F (37.1 C)     TempSrc: Oral     Resp: 18     Height:      Weight:      SpO2: 97% 85% 92% 96%    Intake/Output Summary (Last 24 hours) at 06/02/15 1223 Last data filed at 06/02/15 [id number]  Gross per 24 hour  Intake    780 ml  Output   2300 ml  Net  -1520 ml   Filed Weights   05/30/15 1634 05/30/15 2248  Weight: 90.719 kg (200 lb) 102.3 kg (225 lb 8.5 oz)    Examination:  General exam: Appears calm and comfortable, no distress Respiratory system: rare basilar ronchi, Respiratory effort normal. Cardiovascular system: S1 & S2 heard, RRR. No JVD, murmurs, rubs, gallops or clicks. No pedal edema. Gastrointestinal system: Abdomen is nondistended, soft and nontender. No organomegaly or masses felt. Normal bowel sounds heard. Central nervous system: Alert and oriented. No focal neurological deficits. Extremities: R leg immobilized Skin: No rashes, lesions or ulcers Psychiatry: Judgement and insight appear normal. Mood & affect appropriate.     Data Reviewed: I have personally reviewed following labs and imaging studies  CBC:  Recent Labs Lab 05/30/15 1730 06/01/15 0530 06/02/15 0537  WBC 6.5 5.5 5.5  HGB 12.6 10.6* 10.7*  HCT 38.0 32.4* 32.5*  MCV 91.6 93.4 89.3  PLT 358 325 0000000   Basic Metabolic Panel:  Recent Labs Lab 05/30/15 1730 06/01/15 0530 06/02/15 0537  NA 138 140  139  K 4.0 3.6 4.2  CL 102 107 107  CO2 28 29 25   GLUCOSE 159* 128* 132*  BUN 13 12 12   CREATININE 0.91 0.98 0.92  CALCIUM 9.4 8.7* 8.7*   GFR: Estimated Creatinine Clearance: 66 mL/min (by C-G formula based on Cr of 0.92). Liver Function Tests: No results for input(s): AST, ALT, ALKPHOS, BILITOT, PROT, ALBUMIN in the last 168 hours. No results for input(s): LIPASE, AMYLASE in the last 168 hours. No results for  input(s): AMMONIA in the last 168 hours. Coagulation Profile:  Recent Labs Lab 05/30/15 1730  INR 1.06   Cardiac Enzymes:  Recent Labs Lab 05/30/15 1742  TROPONINI <0.03   BNP (last 3 results) No results for input(s): PROBNP in the last 8760 hours. HbA1C: No results for input(s): HGBA1C in the last 72 hours. CBG:  Recent Labs Lab 05/31/15 2115  GLUCAP 126*   Lipid Profile:  Recent Labs  05/31/15 0509  CHOL 148  HDL 43  LDLCALC 76  TRIG 145  CHOLHDL 3.4   Thyroid Function Tests:  Recent Labs  05/31/15 0509  TSH 5.024*   Anemia Panel: No results for input(s): VITAMINB12, FOLATE, FERRITIN, TIBC, IRON, RETICCTPCT in the last 72 hours. Urine analysis:    Component Value Date/Time   COLORURINE YELLOW 05/30/2015 1744   APPEARANCEUR CLOUDY* 05/30/2015 1744   LABSPEC 1.019 05/30/2015 1744   PHURINE 6.0 05/30/2015 [place] 05/30/2015 1744   HGBUR NEGATIVE 05/30/2015 1744   [place] 05/30/2015 1744   [place] 05/30/2015 1744   PROTEINUR NEGATIVE 05/30/2015 1744   UROBILINOGEN 0.2 02/13/2011 1018   NITRITE NEGATIVE 05/30/2015 1744   LEUKOCYTESUR MODERATE* 05/30/2015 1744   Sepsis Labs: @LABRCNTIP (procalcitonin:4,lacticidven:4)  ) Recent Results (from the past 240 hour(s))  Culture, blood (Routine X 2) w Reflex to ID Panel     Status: None (Preliminary result)   Collection Time: 05/30/15  8:23 PM  Result Value Ref Range Status   Specimen Description RIGHT ANTECUBITAL  Final   Special Requests BOTTLES DRAWN AEROBIC AND ANAEROBIC 5CC  Final   Culture   Final    NO GROWTH 2 DAYS Performed at [facility]    Report Status PENDING  Incomplete  Respiratory Panel by PCR     Status: None   Collection Time: 05/30/15 11:02 PM  Result Value Ref Range Status   Adenovirus NOT DETECTED NOT DETECTED Final   Coronavirus 229E NOT DETECTED NOT DETECTED Final   Coronavirus HKU1 NOT DETECTED NOT DETECTED Final    Coronavirus NL63 NOT DETECTED NOT DETECTED Final   Coronavirus OC43 NOT DETECTED NOT DETECTED Final   Metapneumovirus NOT DETECTED NOT DETECTED Final   Rhinovirus / Enterovirus NOT DETECTED NOT DETECTED Final   Influenza A NOT DETECTED NOT DETECTED Final   Influenza A H1 NOT DETECTED NOT DETECTED Final   Influenza A H1 2009 NOT DETECTED NOT DETECTED Final   Influenza A H3 NOT DETECTED NOT DETECTED Final   Influenza B NOT DETECTED NOT DETECTED Final   Parainfluenza Virus 1 NOT DETECTED NOT DETECTED Final   Parainfluenza Virus 2 NOT DETECTED NOT DETECTED Final   Parainfluenza Virus 3 NOT DETECTED NOT DETECTED Final   Parainfluenza Virus 4 NOT DETECTED NOT DETECTED Final   Respiratory Syncytial Virus NOT DETECTED NOT DETECTED Final   Bordetella pertussis NOT DETECTED NOT DETECTED Final   Chlamydophila pneumoniae NOT DETECTED NOT DETECTED Final   Mycoplasma pneumoniae NOT DETECTED NOT DETECTED Final  Urine culture  Status: Abnormal   Collection Time: 05/30/15 11:02 PM  Result Value Ref Range Status   Specimen Description URINE, CLEAN CATCH  Final   Special Requests NONE  Final   Culture MULTIPLE SPECIES PRESENT, SUGGEST RECOLLECTION (A)  Final   Report Status 06/01/2015 FINAL  Final  Culture, blood (Routine X 2) w Reflex to ID Panel     Status: None (Preliminary result)   Collection Time: 05/31/15  5:09 AM  Result Value Ref Range Status   Specimen Description BLOOD LEFT ANTECUBITAL  Final   Special Requests BOTTLES DRAWN AEROBIC ONLY 4ML  Final   Culture   Final    NO GROWTH 2 DAYS Performed at [facility]    Report Status PENDING  Incomplete         Radiology Studies: No results found.      Scheduled Meds: . atenolol  25 mg Oral Daily  . calcium-vitamin D  1 tablet Oral BID  . cyanocobalamin  500 mcg Oral Daily  . dextromethorphan-guaiFENesin  1 tablet Oral BID  . DULoxetine  60 mg Oral Daily  . enoxaparin (LOVENOX) injection  40 mg Subcutaneous QHS  .  levofloxacin  500 mg Oral q1800  . levothyroxine  125 mcg Oral QAC breakfast  . loratadine  10 mg Oral Daily  . pantoprazole  40 mg Oral Daily  . rosuvastatin  20 mg Oral Daily   Continuous Infusions:    LOS: 2 days    Time spent: 43min    Domenic Polite, MD [facility] Pager [phone]  If 7PM-7AM, please contact night-coverage [email or web address] Password [facility] 06/02/2015, 12:23 PM

## 2015-06-03 DIAGNOSIS — F418 Other specified anxiety disorders: Secondary | ICD-10-CM

## 2015-06-03 MED ORDER — LEVOFLOXACIN 500 MG PO TABS: 500.0000 mg | ORAL_TABLET | Freq: Every day | ORAL | Status: DC

## 2015-06-03 MED ORDER — LEVOTHYROXINE SODIUM 125 MCG PO TABS: 125.0000 ug | ORAL_TABLET | Freq: Every day | ORAL | Status: DC

## 2015-06-03 NOTE — Progress Notes (Signed)
Pt selected [facility] for [facility]. Referral given to in house rep.

## 2015-06-03 NOTE — Care Management Important Message (Signed)
Important Message  Patient Details  Name: Danielle [name] MRN: [id number] Date of Birth: 12/21/1945   Medicare Important Message Given:  Yes    Camillo Flaming 06/03/2015, 10:26 AMImportant Message  Patient Details  Name: Danielle [name] MRN: [id number] Date of Birth: [birth year]   Medicare Important Message Given:  Yes    Camillo Flaming 06/03/2015, 10:26 AM

## 2015-06-03 NOTE — Progress Notes (Signed)
Occupational Therapy Treatment Patient Details Name: Danielle [name] MRN: [id number] DOB: 1945-12-22 Today's Date: 06/03/2015    History of present illness 70 y.o. female with medical history significant of hypertension, hyperlipidemia, GERD, hypothyroidism, depression, anxiety, OSA on CPAP, history of difficult intubation, recent right tibial plateau fracture (did not have surgery) and admitted for acute respiratory failure   OT comments  Energy conservation handout provided  Follow Up Recommendations  Supervision/Assistance - 24 hour;Home health OT    Equipment Recommendations  3 in 1 bedside comode;Tub/shower bench       Precautions / Restrictions Precautions Precautions: Fall Restrictions Weight Bearing Restrictions: Yes RLE Weight Bearing: Non weight bearing       Mobility Bed Mobility               General bed mobility comments: pt in chair  Transfers Overall transfer level: Needs assistance Equipment used: Rolling walker (2 wheeled) Transfers: Sit to/from Stand Sit to Stand: Min guard                  ADL Overall ADL's : Needs assistance/impaired                                       General ADL Comments: OT session focused on energy conservation strategies. Hand out provided.  Pt will benefit from OT as reccomended.  Pt with many questions regarding ADL activity that would be best addressed in her home.                 Cognition   Behavior During Therapy: WFL for tasks assessed/performed Overall Cognitive Status: Within Functional Limits for tasks assessed                               General Comments  Session focused on energy conservation strategies    Pertinent Vitals/ Pain       Pain Assessment: No/denies pain         Frequency Min 2X/week     Progress Toward Goals  OT Goals(current goals can now be found in the care plan section)  Progress towards OT goals: Progressing toward goals     Plan  Discharge plan needs to be updated       End of Session Equipment Utilized During Treatment: Right knee immobilizer   Activity Tolerance Patient limited by fatigue   Patient Left in chair;with call bell/phone within reach;with chair alarm set;with family/visitor present           Time: [id number] OT Time Calculation (min): 23 min  Charges: OT General Charges $OT Visit: 1 Procedure OT Treatments $Self Care/Home Management : 23-37 mins  Justise Ehmann, Thereasa Parkin 06/03/2015, 3:52 PM

## 2015-06-04 DIAGNOSIS — S82141D Displaced bicondylar fracture of right tibia, subsequent encounter for closed fracture with routine healing: Secondary | ICD-10-CM | POA: Diagnosis not present

## 2015-06-04 DIAGNOSIS — W19XXXD Unspecified fall, subsequent encounter: Secondary | ICD-10-CM | POA: Diagnosis not present

## 2015-06-04 DIAGNOSIS — M170 Bilateral primary osteoarthritis of knee: Secondary | ICD-10-CM | POA: Diagnosis not present

## 2015-06-04 DIAGNOSIS — M160 Bilateral primary osteoarthritis of hip: Secondary | ICD-10-CM | POA: Diagnosis not present

## 2015-06-04 DIAGNOSIS — H409 Unspecified glaucoma: Secondary | ICD-10-CM | POA: Diagnosis not present

## 2015-06-04 DIAGNOSIS — M519 Unspecified thoracic, thoracolumbar and lumbosacral intervertebral disc disorder: Secondary | ICD-10-CM | POA: Diagnosis not present

## 2015-06-05 ENCOUNTER — Encounter: Payer: Self-pay | Admitting: *Deleted

## 2015-06-05 ENCOUNTER — Other Ambulatory Visit: Payer: Self-pay | Admitting: *Deleted

## 2015-06-05 LAB — CULTURE, BLOOD (ROUTINE X 2)
CULTURE: NO GROWTH
CULTURE: NO GROWTH

## 2015-06-05 NOTE — Patient Outreach (Signed)
Transition of care call #1 Pt has a tibial fx a couple of weeks ago and now has had a complication of atypicla pneumonia and new dx of mild diastolic dysfunction. She has her meds, home health has been established. I will follow pt for 30 days making weekly transition of care calls. If when pt home health plan is finished I would be able to see her if she would require this.  I will advise Dr. Felipa Eth that pt would benefit from being established with a cardiologist.  I will call pt next Wednesday.  Deloria Lair [facility] [place] [phone]

## 2015-06-05 NOTE — Patient Outreach (Signed)
Transition of care call #1 attempted. No one was home or was able to answer the phone. I left a message and asked for pt to return my call at her convenience.  Danielle [name] [facility] [phone]

## 2015-06-08 DIAGNOSIS — M170 Bilateral primary osteoarthritis of knee: Secondary | ICD-10-CM | POA: Diagnosis not present

## 2015-06-08 DIAGNOSIS — M519 Unspecified thoracic, thoracolumbar and lumbosacral intervertebral disc disorder: Secondary | ICD-10-CM | POA: Diagnosis not present

## 2015-06-08 DIAGNOSIS — H409 Unspecified glaucoma: Secondary | ICD-10-CM | POA: Diagnosis not present

## 2015-06-08 DIAGNOSIS — S82141D Displaced bicondylar fracture of right tibia, subsequent encounter for closed fracture with routine healing: Secondary | ICD-10-CM | POA: Diagnosis not present

## 2015-06-08 DIAGNOSIS — W19XXXD Unspecified fall, subsequent encounter: Secondary | ICD-10-CM | POA: Diagnosis not present

## 2015-06-08 DIAGNOSIS — M160 Bilateral primary osteoarthritis of hip: Secondary | ICD-10-CM | POA: Diagnosis not present

## 2015-06-11 ENCOUNTER — Other Ambulatory Visit: Payer: Self-pay | Admitting: Geriatric Medicine

## 2015-06-11 ENCOUNTER — Ambulatory Visit
Admission: RE | Admit: 2015-06-11 | Discharge: 2015-06-11 | Disposition: A | Discharge status: Home / Self Care | Payer: Medicare Other | Source: Ambulatory Visit | Attending: Geriatric Medicine | Admitting: Geriatric Medicine

## 2015-06-11 DIAGNOSIS — R0602 Shortness of breath: Secondary | ICD-10-CM | POA: Diagnosis not present

## 2015-06-11 DIAGNOSIS — I519 Heart disease, unspecified: Secondary | ICD-10-CM | POA: Diagnosis not present

## 2015-06-11 DIAGNOSIS — E039 Hypothyroidism, unspecified: Secondary | ICD-10-CM | POA: Diagnosis not present

## 2015-06-11 DIAGNOSIS — J988 Other specified respiratory disorders: Secondary | ICD-10-CM

## 2015-06-11 DIAGNOSIS — I10 Essential (primary) hypertension: Secondary | ICD-10-CM | POA: Diagnosis not present

## 2015-06-12 ENCOUNTER — Other Ambulatory Visit: Payer: Self-pay | Admitting: *Deleted

## 2015-06-12 DIAGNOSIS — M170 Bilateral primary osteoarthritis of knee: Secondary | ICD-10-CM | POA: Diagnosis not present

## 2015-06-12 DIAGNOSIS — M160 Bilateral primary osteoarthritis of hip: Secondary | ICD-10-CM | POA: Diagnosis not present

## 2015-06-12 DIAGNOSIS — S82141D Displaced bicondylar fracture of right tibia, subsequent encounter for closed fracture with routine healing: Secondary | ICD-10-CM | POA: Diagnosis not present

## 2015-06-12 DIAGNOSIS — W19XXXD Unspecified fall, subsequent encounter: Secondary | ICD-10-CM | POA: Diagnosis not present

## 2015-06-12 DIAGNOSIS — H409 Unspecified glaucoma: Secondary | ICD-10-CM | POA: Diagnosis not present

## 2015-06-12 DIAGNOSIS — M519 Unspecified thoracic, thoracolumbar and lumbosacral intervertebral disc disorder: Secondary | ICD-10-CM | POA: Diagnosis not present

## 2015-06-12 NOTE — Patient Outreach (Signed)
Transition of care call #2 - Pt is doing well. She saw Dr. Felipa Eth yesterday. He feels like she does not need to see a cardiologist or pulmonologist at this time. She only has some SOB with exertion. She will see her orthopedist tomorrow. She is progressing well with her PT.  I reminded her I am available for questions in between calls if she needs me. I will talk to her next Wednesday.  Deloria Lair [facility] [phone]

## 2015-06-13 DIAGNOSIS — S82141D Displaced bicondylar fracture of right tibia, subsequent encounter for closed fracture with routine healing: Secondary | ICD-10-CM | POA: Diagnosis not present

## 2015-06-14 NOTE — Discharge Summary (Signed)
Physician Discharge Summary  [name] [id number] DOB: 01-24-1945 DOA: 05/30/2015  PCP: Lottie Dawson, MD  Admit date: 05/30/2015 Discharge date: 06/03/2015  Time spent: 45 minutes  Recommendations for Outpatient Follow-up:  1. PCP Dr.Shamleffer in 1 week, please wean off Home O2 at FU 2. FU with Dr.Beane for Tibial plateau fracture   Discharge Diagnoses:  Principal Problem:   Acute respiratory failure with hypoxia ([name]) Active Problems:   Tibial plateau fracture   Closed tibial fracture   Sleep apnea   Hypertension   Hypercholesterolemia   GERD (gastroesophageal reflux disease)   Atypical pneumonia   Dyspnea   Hypothyroidism   Depression with anxiety   Discharge Condition: stable  Diet recommendation: low sodium  Filed Weights   05/30/15 1634 05/30/15 2248  Weight: 90.719 kg (200 lb) 102.3 kg (225 lb 8.5 oz)    History of present illness:  HPI: Danielle [name] is a 70 y.o. female with medical history significant of hypertension, hyperlipidemia, GERD, hypothyroidism, depression, anxiety, OSA on CPAP, history of difficult intubation, recent right tibial plateau fracture (did not have surgery), who presented with shortness of breath and generalized weakness. Pt stated that she was admitted to the hospital from 5/4-5/5 due to right tibial plateau fracture secondary to mechanical fall. She did not require surgery and was wearing brace in right leg.  Hospital Course:  Acute respiratory failure with hypoxia [facility]): - this is likely due to atypical infection and mild diastolic CHF - BNP is normal, but she is obese, hence could be falsely low, trace edema in legs and sacrum noted -improved with Abx and a few doses of IV lasix -2D ECHO with normal EF and grade 1DD, stopped lasix after 2days since BP soft, resume HCTZ at discharge -changed IV levaquin to PO -lactate normalized, procalcitonin <0.10 -CTA negative for PE -discharged home in stable  condition, desaturated to 87% with activity and hence set up with home O2 prior to DC, may need this for 1-2weeks only  Recent Tibial plateau fracture: CT-scan on 05/16/15 showed longitudinal/vertical fractures involving the lateral tibial plateau with minimal depression posteriorly estimated at 3 mm. She was managed by ortho, Dr.Beane, did not require surgery. Has brace on. -PT/OT eval completed, pain controlled -FU with Dr.Beane  HTN: -continue atenolol   OSA: -CPAP  HLD: -Continue Crestor  GERD: -Protonix  Hypothyroidism:  -TSH up, increased synthroid dose to 187mcg  Depression and anxiety: . -Continue PRN Xanax, Cymbalta  Discharge Exam: Filed Vitals:   06/03/15 1200 06/03/15 1351  BP: 100/55 110/86  Pulse: 87 83  Temp:  98.7 F (37.1 C)  Resp:  18    General: AAOx3 Cardiovascular: S1S2/RRR Respiratory: improved air movement  Discharge Instructions   Discharge Instructions    Diet - low sodium heart healthy    Complete by:  As directed      Diet - low sodium heart healthy    Complete by:  As directed      Discharge instructions    Complete by:  As directed   Activity as recommended by Orthopedics     Increase activity slowly    Complete by:  As directed           Discharge Medication List as of 06/03/2015  2:28 PM    START taking these medications   Details  levothyroxine (SYNTHROID, LEVOTHROID) 125 MCG tablet Take 1 tablet (125 mcg total) by mouth daily before breakfast., Starting 06/03/2015, Until Discontinued, Normal    senna-docusate (SENOKOT-S)  8.6-50 MG tablet Take 1 tablet by mouth 2 (two) times daily as needed for mild constipation., Starting 06/02/2015, Until Discontinued, Print      CONTINUE these medications which have CHANGED   Details  levofloxacin (LEVAQUIN) 500 MG tablet Take 1 tablet (500 mg total) by mouth daily. For 70more days, Starting 06/03/2015, Until Discontinued, Normal      CONTINUE these medications which have NOT CHANGED    Details  ALPRAZolam (XANAX) 0.25 MG tablet TK 1 T PO BID PRN ANXIETY, Historical Med    atenolol (TENORMIN) 25 MG tablet Take 25 mg by mouth every morning.  , Until Discontinued, Historical Med    calcium-vitamin D (OSCAL WITH D) 500-200 MG-UNIT per tablet Take 1 tablet by mouth 2 (two) times daily.  , Until Discontinued, Historical Med    cetirizine (ZYRTEC) 10 MG tablet Take 10 mg by mouth daily as needed for allergies. , Until Discontinued, Historical Med    DULoxetine (CYMBALTA) 60 MG capsule Take 60 mg by mouth daily., Until Discontinued, Historical Med    hydrochlorothiazide (HYDRODIURIL) 25 MG tablet Take 25 mg by mouth daily., Until Discontinued, Historical Med    omeprazole (PRILOSEC) 20 MG capsule Take 20 mg by mouth daily as needed (for heartburn). , Until Discontinued, Historical Med    rosuvastatin (CRESTOR) 20 MG tablet Take 20 mg by mouth daily., Until Discontinued, Historical Med    vitamin B-12 (CYANOCOBALAMIN) 500 MCG tablet Take 500 mcg by mouth daily. Reported on 05/16/2015, Until Discontinued, Historical Med    loratadine (ALLERGY) 10 MG tablet Take 10 mg by mouth daily., Until Discontinued, Historical Med    ondansetron (ZOFRAN-ODT) 8 MG disintegrating tablet DIS 1 T ON THE TONGUE Q 6 H PRF NAUSEA, Historical Med    oxyCODONE-acetaminophen (ROXICET) 5-325 MG tablet Take 1 tablet by mouth every 6 (six) hours as needed for severe pain., Starting 05/17/2015, Until Discontinued, Print      STOP taking these medications     Levothyroxine Sodium (TIROSINT) 112 MCG CAPS      docusate sodium (COLACE) 100 MG capsule        Allergies  Allergen Reactions  . Ceftin [Cefuroxime Axetil] Hives and Swelling    Swelling of face  . Codeine Nausea Only  . Compazine [Prochlorperazine] Swelling and Other (See Comments)    Face and tongue swelling   Follow-up Information    Follow up with Shamleffer, Herschell Dimes, MD. Schedule an appointment as soon as possible for a visit  in 1 week.   Specialty:  Internal Medicine   Contact information:   [place]  [address] [place] [address] [phone]        The results of significant diagnostics from this hospitalization (including imaging, microbiology, ancillary and laboratory) are listed below for reference.    Significant Diagnostic Studies: Dg Chest 2 View  06/11/2015  CLINICAL DATA:  Shortness of breath.  Recent history of pneumonia. EXAM: CHEST  2 VIEW COMPARISON:  05/30/2015.  CT 05/30/2015. FINDINGS: Cardiomegaly with normal pulmonary vascularity. Patchy infiltrates are noted throughout both lungs. No interval clearing. No prominent pleural effusion. No pneumothorax. Prior cervical thoracic spine fusion. IMPRESSION: Persistent multifocal bilateral pulmonary infiltrates without interval clearing. Continued follow-up chest x-rays recommended to demonstrate clearing. Electronically Signed   By: Marcello Moores  Register   On: 06/11/2015 12:24   Dg Chest 2 View  05/30/2015  CLINICAL DATA:  Shortness of breath and dry cough for 1 week. History of hypertension. EXAM: CHEST  2  VIEW COMPARISON:  Chest x-ray dated 02/13/2011. FINDINGS: Study is hypoinspiratory with crowding of the perihilar and bibasilar bronchovascular markings. Given the low lung volumes, lungs appear clear. No confluent opacity to suggest a developing pneumonia. No pleural effusion or pneumothorax seen. Heart size is within normal limits. Overall cardiomediastinal silhouette is stable in size and configuration. Anterior cervical fusion hardware appears stable in position. No acute osseous abnormality seen. IMPRESSION: Low lung volumes. No evidence of acute cardiopulmonary abnormality. No pneumonia seen. Electronically Signed   By: Franki Cabot M.D.   On: 05/30/2015 17:34   Ct Angio Chest Pe W/cm &/or Wo Cm  05/30/2015  CLINICAL DATA:  Shortness of breath and dyspnea with exertion. Tibial plateau fracture 2 weeks ago. EXAM: CT ANGIOGRAPHY CHEST WITH  CONTRAST TECHNIQUE: Multidetector CT imaging of the chest was performed using the standard protocol during bolus administration of intravenous contrast. Multiplanar CT image reconstructions and MIPs were obtained to evaluate the vascular anatomy. CONTRAST:  65 cc Isovue 370 COMPARISON:  None. FINDINGS: Mediastinum/Lymph Nodes: Some of the most peripheral segmental and subsegmental pulmonary artery branches are difficult to definitively characterize due to patient breathing motion artifact. There is no pulmonary embolism identified within the main, lobar or central segmental pulmonary arteries. Mild cardiomegaly. No pericardial effusion. Scattered atherosclerotic changes are seen along the walls of the normal-caliber thoracic aorta. No aortic aneurysm or dissection. No mass or enlarged lymph nodes seen within the mediastinum. Trachea and central bronchi are unremarkable. Lungs/Pleura: Small patchy consolidations are seen throughout both lungs, some ground-glass density. No pleural effusion or pneumothorax. Upper abdomen: No acute findings.  Status post cholecystectomy. Musculoskeletal: Anterior cervical fusion hardware partially imaged in the lower cervical spine. Mild degenerative spurring throughout the thoracic spine. No acute - appearing osseous abnormality. Review of the MIP images confirms the above findings. IMPRESSION: 1. No pulmonary embolism seen, with mild study limitations detailed above. 2. Mild cardiomegaly. 3. No aortic aneurysm or dissection. 4. Patchy small consolidations scattered throughout both lungs, some of which are ground-glass density. The consolidations at each lung base are most likely atelectasis, less likely pneumonia or aspiration. Differential for the remainder of the scattered small consolidations (most being ground-glass density) includes atypical pneumonias such as viral or fungal, interstitial pneumonias, edema related to volume overload/CHF, chronic interstitial diseases,  hypersensitivity pneumonitis, and respiratory bronchiolitis. Electronically Signed   By: Franki Cabot M.D.   On: 05/30/2015 19:07   Ct Knee Right Wo Contrast  05/16/2015  CLINICAL DATA:  [place] today and injured right knee. Pain and swelling. Evaluate lateral tibial plateau fracture. EXAM: CT OF THE right KNEE WITHOUT CONTRAST TECHNIQUE: Multidetector CT imaging of the right knee was performed according to the standard protocol. Multiplanar CT image reconstructions were also generated. COMPARISON:  Radiographs 05/16/2015 FINDINGS: There are vertical/longitudinal fractures involving the lateral tibial plateau without significant displacement. There is minimal depression involving the posterior aspect of the tibial plateau estimated at 3 mm. The medial tibial plateau is intact. No femur, fibula or patella fracture. Associated large lipohemarthrosis. The quadriceps pelvic tendons are intact. No obvious ligamentous injury. IMPRESSION: Longitudinal/vertical fractures involving the lateral tibial plateau with minimal depression posteriorly estimated at 3 mm. No other significant bony findings. Associated large lipohemarthrosis. Grossly intact ligamentous structures. Electronically Signed   By: Marijo Sanes M.D.   On: 05/16/2015 09:06   Dg Knee Complete 4 Views Right  05/16/2015  CLINICAL DATA:  Fall.  Initial evaluation. EXAM: RIGHT KNEE - COMPLETE 4+ VIEW COMPARISON:  11/12/2014. FINDINGS:  Moderate knee joint effusion noted. Slight depressed fracture of the lateral tibial plateau appears to be present. Linear lucency noted Reading longitudinally in the upper tibia on oblique view only is most likely related overlying fat plane. Diffuse degenerative change present. IMPRESSION: 1. Slightly displaced fracture of the lateral tibial plateau. Moderate knee joint effusion. 2. Tricompartment degenerative change. Electronically Signed   By: Marcello Moores  Register   On: 05/16/2015 07:51    Microbiology: No results found for this  or any previous visit (from the past 240 hour(s)).   Labs: Basic Metabolic Panel: No results for input(s): NA, K, CL, CO2, GLUCOSE, BUN, CREATININE, CALCIUM, MG, PHOS in the last 168 hours. Liver Function Tests: No results for input(s): AST, ALT, ALKPHOS, BILITOT, PROT, ALBUMIN in the last 168 hours. No results for input(s): LIPASE, AMYLASE in the last 168 hours. No results for input(s): AMMONIA in the last 168 hours. CBC: No results for input(s): WBC, NEUTROABS, HGB, HCT, MCV, PLT in the last 168 hours. Cardiac Enzymes: No results for input(s): CKTOTAL, CKMB, CKMBINDEX, TROPONINI in the last 168 hours. BNP: BNP (last 3 results)  Recent Labs  05/30/15 1730  BNP 11.3    ProBNP (last 3 results) No results for input(s): PROBNP in the last 8760 hours.  CBG: No results for input(s): GLUCAP in the last 168 hours.     SignedDomenic Polite MD.  [facility] 06/14/2015, 3:08 PM

## 2015-06-27 ENCOUNTER — Other Ambulatory Visit: Payer: Self-pay | Admitting: Internal Medicine

## 2015-06-27 ENCOUNTER — Other Ambulatory Visit: Payer: Self-pay | Admitting: *Deleted

## 2015-06-27 ENCOUNTER — Ambulatory Visit
Admission: RE | Admit: 2015-06-27 | Discharge: 2015-06-27 | Disposition: A | Discharge status: Home / Self Care | Payer: Medicare Other | Source: Ambulatory Visit | Attending: Internal Medicine | Admitting: Internal Medicine

## 2015-06-27 DIAGNOSIS — J988 Other specified respiratory disorders: Secondary | ICD-10-CM

## 2015-06-27 DIAGNOSIS — R05 Cough: Secondary | ICD-10-CM | POA: Diagnosis not present

## 2015-06-27 NOTE — Patient Outreach (Signed)
Transition of care calls 3 & 4  I talked with Ms. Danielle [name] this am. She states she is doing well. She is out of her leg immobilizer and walking with a walker still. Her breathing has improved. She has seen Dr. Felipa Eth and her orthopedist.   She reminded me we did speak last Wednesday, June 7th, however I did not document this visit in the EMR, as I was driving and only made a written note. She did report to me at that time that she had had a Chest X Ray which showed improvement. Her breathing is constantly improving. She has more energy. She is using the walker and an Transport planner.  I have advised her I will be checking in on her 2 more times. I will call her again next week.  Deloria Lair [facility] [place] [phone]

## 2015-06-28 ENCOUNTER — Ambulatory Visit: Payer: Self-pay | Admitting: *Deleted

## 2015-07-04 ENCOUNTER — Other Ambulatory Visit: Payer: Self-pay | Admitting: *Deleted

## 2015-07-04 DIAGNOSIS — S82141D Displaced bicondylar fracture of right tibia, subsequent encounter for closed fracture with routine healing: Secondary | ICD-10-CM | POA: Diagnosis not present

## 2015-07-04 DIAGNOSIS — M7542 Impingement syndrome of left shoulder: Secondary | ICD-10-CM | POA: Diagnosis not present

## 2015-07-04 NOTE — Patient Outreach (Signed)
Transition of care call #5 - Pt is currently at the orthopedist office waiting to be seen. She reports she is doing well. She denies further SOB and has been able to discontinue her O2 use. Her Chest X-Ray from last week revealed no residual signs of pneumonia. She is hoping that today her orthopedist, Dr.Beane will allow her to be out of the immobilizer and start PT.  I will call her next week and have reminded her to call me if any problems arise.  Danielle [name] [facility] [place] [phone]

## 2015-07-09 DIAGNOSIS — W19XXXD Unspecified fall, subsequent encounter: Secondary | ICD-10-CM | POA: Diagnosis not present

## 2015-07-09 DIAGNOSIS — S82141D Displaced bicondylar fracture of right tibia, subsequent encounter for closed fracture with routine healing: Secondary | ICD-10-CM | POA: Diagnosis not present

## 2015-07-09 DIAGNOSIS — M519 Unspecified thoracic, thoracolumbar and lumbosacral intervertebral disc disorder: Secondary | ICD-10-CM | POA: Diagnosis not present

## 2015-07-09 DIAGNOSIS — M160 Bilateral primary osteoarthritis of hip: Secondary | ICD-10-CM | POA: Diagnosis not present

## 2015-07-09 DIAGNOSIS — H409 Unspecified glaucoma: Secondary | ICD-10-CM | POA: Diagnosis not present

## 2015-07-09 DIAGNOSIS — M170 Bilateral primary osteoarthritis of knee: Secondary | ICD-10-CM | POA: Diagnosis not present

## 2015-07-11 ENCOUNTER — Other Ambulatory Visit: Payer: Self-pay | Admitting: *Deleted

## 2015-07-11 DIAGNOSIS — S82141D Displaced bicondylar fracture of right tibia, subsequent encounter for closed fracture with routine healing: Secondary | ICD-10-CM | POA: Diagnosis not present

## 2015-07-11 DIAGNOSIS — M7542 Impingement syndrome of left shoulder: Secondary | ICD-10-CM | POA: Diagnosis not present

## 2015-07-12 ENCOUNTER — Encounter: Payer: Self-pay | Admitting: *Deleted

## 2015-07-12 NOTE — Patient Outreach (Signed)
Final Transition of care call. Pt reports she is doing well. No further problems with SOB. She is taking her meds routinely and keeping up with her medical appts. She has started PT and is doing well.   I have advised her that this is my last call. I emphasized it is important to seek medical assistance early to avoid hospitalization. I have reminded her she may call me in the future if she has questions.  Deloria Lair [facility] [place] [phone]

## 2015-07-17 DIAGNOSIS — M7542 Impingement syndrome of left shoulder: Secondary | ICD-10-CM | POA: Diagnosis not present

## 2015-07-17 DIAGNOSIS — S82141D Displaced bicondylar fracture of right tibia, subsequent encounter for closed fracture with routine healing: Secondary | ICD-10-CM | POA: Diagnosis not present

## 2015-07-18 DIAGNOSIS — M7542 Impingement syndrome of left shoulder: Secondary | ICD-10-CM | POA: Diagnosis not present

## 2015-07-18 DIAGNOSIS — S82141D Displaced bicondylar fracture of right tibia, subsequent encounter for closed fracture with routine healing: Secondary | ICD-10-CM | POA: Diagnosis not present

## 2015-07-19 DIAGNOSIS — M7542 Impingement syndrome of left shoulder: Secondary | ICD-10-CM | POA: Diagnosis not present

## 2015-07-19 DIAGNOSIS — S82141D Displaced bicondylar fracture of right tibia, subsequent encounter for closed fracture with routine healing: Secondary | ICD-10-CM | POA: Diagnosis not present

## 2015-07-23 DIAGNOSIS — S82141D Displaced bicondylar fracture of right tibia, subsequent encounter for closed fracture with routine healing: Secondary | ICD-10-CM | POA: Diagnosis not present

## 2015-07-23 DIAGNOSIS — M7542 Impingement syndrome of left shoulder: Secondary | ICD-10-CM | POA: Diagnosis not present

## 2015-07-25 ENCOUNTER — Other Ambulatory Visit: Payer: Self-pay | Admitting: Geriatric Medicine

## 2015-07-25 ENCOUNTER — Other Ambulatory Visit: Payer: Self-pay | Admitting: Interventional Cardiology

## 2015-07-25 DIAGNOSIS — Z1231 Encounter for screening mammogram for malignant neoplasm of breast: Secondary | ICD-10-CM

## 2015-07-26 DIAGNOSIS — S82141D Displaced bicondylar fracture of right tibia, subsequent encounter for closed fracture with routine healing: Secondary | ICD-10-CM | POA: Diagnosis not present

## 2015-07-26 DIAGNOSIS — M7542 Impingement syndrome of left shoulder: Secondary | ICD-10-CM | POA: Diagnosis not present

## 2015-07-30 DIAGNOSIS — S82141D Displaced bicondylar fracture of right tibia, subsequent encounter for closed fracture with routine healing: Secondary | ICD-10-CM | POA: Diagnosis not present

## 2015-07-30 DIAGNOSIS — M7542 Impingement syndrome of left shoulder: Secondary | ICD-10-CM | POA: Diagnosis not present

## 2015-08-01 DIAGNOSIS — M7501 Adhesive capsulitis of right shoulder: Secondary | ICD-10-CM | POA: Diagnosis not present

## 2015-08-01 DIAGNOSIS — S82141D Displaced bicondylar fracture of right tibia, subsequent encounter for closed fracture with routine healing: Secondary | ICD-10-CM | POA: Diagnosis not present

## 2015-08-01 DIAGNOSIS — M7542 Impingement syndrome of left shoulder: Secondary | ICD-10-CM | POA: Diagnosis not present

## 2015-08-01 DIAGNOSIS — M7541 Impingement syndrome of right shoulder: Secondary | ICD-10-CM | POA: Diagnosis not present

## 2015-08-05 DIAGNOSIS — D1722 Benign lipomatous neoplasm of skin and subcutaneous tissue of left arm: Secondary | ICD-10-CM | POA: Diagnosis not present

## 2015-08-05 DIAGNOSIS — G5601 Carpal tunnel syndrome, right upper limb: Secondary | ICD-10-CM | POA: Diagnosis not present

## 2015-08-05 DIAGNOSIS — M25522 Pain in left elbow: Secondary | ICD-10-CM | POA: Diagnosis not present

## 2015-08-06 DIAGNOSIS — M7542 Impingement syndrome of left shoulder: Secondary | ICD-10-CM | POA: Diagnosis not present

## 2015-08-06 DIAGNOSIS — S82141D Displaced bicondylar fracture of right tibia, subsequent encounter for closed fracture with routine healing: Secondary | ICD-10-CM | POA: Diagnosis not present

## 2015-08-07 ENCOUNTER — Ambulatory Visit
Admission: RE | Admit: 2015-08-07 | Discharge: 2015-08-07 | Disposition: A | Discharge status: Home / Self Care | Payer: Medicare Other | Source: Ambulatory Visit | Attending: Geriatric Medicine | Admitting: Geriatric Medicine

## 2015-08-07 DIAGNOSIS — Z1231 Encounter for screening mammogram for malignant neoplasm of breast: Secondary | ICD-10-CM | POA: Diagnosis not present

## 2015-08-08 DIAGNOSIS — S82141D Displaced bicondylar fracture of right tibia, subsequent encounter for closed fracture with routine healing: Secondary | ICD-10-CM | POA: Diagnosis not present

## 2015-08-08 DIAGNOSIS — M7542 Impingement syndrome of left shoulder: Secondary | ICD-10-CM | POA: Diagnosis not present

## 2015-08-09 DIAGNOSIS — M7542 Impingement syndrome of left shoulder: Secondary | ICD-10-CM | POA: Diagnosis not present

## 2015-08-09 DIAGNOSIS — S82141D Displaced bicondylar fracture of right tibia, subsequent encounter for closed fracture with routine healing: Secondary | ICD-10-CM | POA: Diagnosis not present

## 2015-08-12 DIAGNOSIS — S82141D Displaced bicondylar fracture of right tibia, subsequent encounter for closed fracture with routine healing: Secondary | ICD-10-CM | POA: Diagnosis not present

## 2015-08-12 DIAGNOSIS — M7542 Impingement syndrome of left shoulder: Secondary | ICD-10-CM | POA: Diagnosis not present

## 2015-08-13 DIAGNOSIS — S82141D Displaced bicondylar fracture of right tibia, subsequent encounter for closed fracture with routine healing: Secondary | ICD-10-CM | POA: Diagnosis not present

## 2015-08-13 DIAGNOSIS — M7542 Impingement syndrome of left shoulder: Secondary | ICD-10-CM | POA: Diagnosis not present

## 2015-08-22 DIAGNOSIS — M7542 Impingement syndrome of left shoulder: Secondary | ICD-10-CM | POA: Diagnosis not present

## 2015-08-22 DIAGNOSIS — S82141D Displaced bicondylar fracture of right tibia, subsequent encounter for closed fracture with routine healing: Secondary | ICD-10-CM | POA: Diagnosis not present

## 2015-08-27 DIAGNOSIS — S82141D Displaced bicondylar fracture of right tibia, subsequent encounter for closed fracture with routine healing: Secondary | ICD-10-CM | POA: Diagnosis not present

## 2015-08-27 DIAGNOSIS — M7542 Impingement syndrome of left shoulder: Secondary | ICD-10-CM | POA: Diagnosis not present

## 2015-09-06 DIAGNOSIS — S82141D Displaced bicondylar fracture of right tibia, subsequent encounter for closed fracture with routine healing: Secondary | ICD-10-CM | POA: Diagnosis not present

## 2015-09-06 DIAGNOSIS — M7542 Impingement syndrome of left shoulder: Secondary | ICD-10-CM | POA: Diagnosis not present

## 2015-09-10 DIAGNOSIS — S82141D Displaced bicondylar fracture of right tibia, subsequent encounter for closed fracture with routine healing: Secondary | ICD-10-CM | POA: Diagnosis not present

## 2015-09-10 DIAGNOSIS — M7542 Impingement syndrome of left shoulder: Secondary | ICD-10-CM | POA: Diagnosis not present

## 2015-09-17 DIAGNOSIS — M7542 Impingement syndrome of left shoulder: Secondary | ICD-10-CM | POA: Diagnosis not present

## 2015-09-17 DIAGNOSIS — S82141D Displaced bicondylar fracture of right tibia, subsequent encounter for closed fracture with routine healing: Secondary | ICD-10-CM | POA: Diagnosis not present

## 2015-09-19 DIAGNOSIS — S82141D Displaced bicondylar fracture of right tibia, subsequent encounter for closed fracture with routine healing: Secondary | ICD-10-CM | POA: Diagnosis not present

## 2015-09-19 DIAGNOSIS — M7542 Impingement syndrome of left shoulder: Secondary | ICD-10-CM | POA: Diagnosis not present

## 2015-09-24 DIAGNOSIS — M7542 Impingement syndrome of left shoulder: Secondary | ICD-10-CM | POA: Diagnosis not present

## 2015-09-24 DIAGNOSIS — S82141D Displaced bicondylar fracture of right tibia, subsequent encounter for closed fracture with routine healing: Secondary | ICD-10-CM | POA: Diagnosis not present

## 2015-09-26 DIAGNOSIS — S82141D Displaced bicondylar fracture of right tibia, subsequent encounter for closed fracture with routine healing: Secondary | ICD-10-CM | POA: Diagnosis not present

## 2015-09-26 DIAGNOSIS — M7542 Impingement syndrome of left shoulder: Secondary | ICD-10-CM | POA: Diagnosis not present

## 2015-10-02 DIAGNOSIS — S82141D Displaced bicondylar fracture of right tibia, subsequent encounter for closed fracture with routine healing: Secondary | ICD-10-CM | POA: Diagnosis not present

## 2015-10-02 DIAGNOSIS — M545 Low back pain: Secondary | ICD-10-CM | POA: Diagnosis not present

## 2015-10-02 DIAGNOSIS — M5137 Other intervertebral disc degeneration, lumbosacral region: Secondary | ICD-10-CM | POA: Diagnosis not present

## 2015-10-02 DIAGNOSIS — M7542 Impingement syndrome of left shoulder: Secondary | ICD-10-CM | POA: Diagnosis not present

## 2015-10-02 DIAGNOSIS — G5601 Carpal tunnel syndrome, right upper limb: Secondary | ICD-10-CM | POA: Diagnosis not present

## 2015-10-10 DIAGNOSIS — S82141D Displaced bicondylar fracture of right tibia, subsequent encounter for closed fracture with routine healing: Secondary | ICD-10-CM | POA: Diagnosis not present

## 2015-10-10 DIAGNOSIS — M7542 Impingement syndrome of left shoulder: Secondary | ICD-10-CM | POA: Diagnosis not present

## 2015-10-15 DIAGNOSIS — M7542 Impingement syndrome of left shoulder: Secondary | ICD-10-CM | POA: Diagnosis not present

## 2015-10-15 DIAGNOSIS — S82141D Displaced bicondylar fracture of right tibia, subsequent encounter for closed fracture with routine healing: Secondary | ICD-10-CM | POA: Diagnosis not present

## 2015-10-16 DIAGNOSIS — M7542 Impingement syndrome of left shoulder: Secondary | ICD-10-CM | POA: Diagnosis not present

## 2015-10-16 DIAGNOSIS — S82141D Displaced bicondylar fracture of right tibia, subsequent encounter for closed fracture with routine healing: Secondary | ICD-10-CM | POA: Diagnosis not present

## 2015-10-22 DIAGNOSIS — M7542 Impingement syndrome of left shoulder: Secondary | ICD-10-CM | POA: Diagnosis not present

## 2015-10-22 DIAGNOSIS — S82141D Displaced bicondylar fracture of right tibia, subsequent encounter for closed fracture with routine healing: Secondary | ICD-10-CM | POA: Diagnosis not present

## 2015-10-23 DIAGNOSIS — S82141D Displaced bicondylar fracture of right tibia, subsequent encounter for closed fracture with routine healing: Secondary | ICD-10-CM | POA: Diagnosis not present

## 2015-10-23 DIAGNOSIS — M7542 Impingement syndrome of left shoulder: Secondary | ICD-10-CM | POA: Diagnosis not present

## 2016-01-04 DIAGNOSIS — H6691 Otitis media, unspecified, right ear: Secondary | ICD-10-CM | POA: Diagnosis not present

## 2016-04-03 DIAGNOSIS — E785 Hyperlipidemia, unspecified: Secondary | ICD-10-CM | POA: Diagnosis not present

## 2016-04-03 DIAGNOSIS — F329 Major depressive disorder, single episode, unspecified: Secondary | ICD-10-CM | POA: Diagnosis not present

## 2016-04-03 DIAGNOSIS — R5383 Other fatigue: Secondary | ICD-10-CM | POA: Diagnosis not present

## 2016-04-03 DIAGNOSIS — M899 Disorder of bone, unspecified: Secondary | ICD-10-CM | POA: Diagnosis not present

## 2016-04-03 DIAGNOSIS — E039 Hypothyroidism, unspecified: Secondary | ICD-10-CM | POA: Diagnosis not present

## 2016-04-03 DIAGNOSIS — M1990 Unspecified osteoarthritis, unspecified site: Secondary | ICD-10-CM | POA: Diagnosis not present

## 2016-04-03 DIAGNOSIS — I10 Essential (primary) hypertension: Secondary | ICD-10-CM | POA: Diagnosis not present

## 2016-04-03 DIAGNOSIS — G4733 Obstructive sleep apnea (adult) (pediatric): Secondary | ICD-10-CM | POA: Diagnosis not present

## 2016-04-03 DIAGNOSIS — F419 Anxiety disorder, unspecified: Secondary | ICD-10-CM | POA: Diagnosis not present

## 2016-04-03 DIAGNOSIS — Z79899 Other long term (current) drug therapy: Secondary | ICD-10-CM | POA: Diagnosis not present

## 2016-04-03 DIAGNOSIS — K219 Gastro-esophageal reflux disease without esophagitis: Secondary | ICD-10-CM | POA: Diagnosis not present

## 2016-04-23 DIAGNOSIS — G4733 Obstructive sleep apnea (adult) (pediatric): Secondary | ICD-10-CM | POA: Diagnosis not present

## 2016-04-28 DIAGNOSIS — I10 Essential (primary) hypertension: Secondary | ICD-10-CM | POA: Diagnosis not present

## 2016-04-28 DIAGNOSIS — F411 Generalized anxiety disorder: Secondary | ICD-10-CM | POA: Diagnosis not present

## 2016-04-28 DIAGNOSIS — Z1211 Encounter for screening for malignant neoplasm of colon: Secondary | ICD-10-CM | POA: Diagnosis not present

## 2016-04-28 DIAGNOSIS — Z1231 Encounter for screening mammogram for malignant neoplasm of breast: Secondary | ICD-10-CM | POA: Diagnosis not present

## 2016-04-28 DIAGNOSIS — G4733 Obstructive sleep apnea (adult) (pediatric): Secondary | ICD-10-CM | POA: Diagnosis not present

## 2016-04-28 DIAGNOSIS — M1990 Unspecified osteoarthritis, unspecified site: Secondary | ICD-10-CM | POA: Diagnosis not present

## 2016-04-28 DIAGNOSIS — K219 Gastro-esophageal reflux disease without esophagitis: Secondary | ICD-10-CM | POA: Diagnosis not present

## 2016-04-28 DIAGNOSIS — Z0000 Encounter for general adult medical examination without abnormal findings: Secondary | ICD-10-CM | POA: Diagnosis not present

## 2016-04-28 DIAGNOSIS — I519 Heart disease, unspecified: Secondary | ICD-10-CM | POA: Diagnosis not present

## 2016-04-28 DIAGNOSIS — E039 Hypothyroidism, unspecified: Secondary | ICD-10-CM | POA: Diagnosis not present

## 2016-04-28 DIAGNOSIS — F329 Major depressive disorder, single episode, unspecified: Secondary | ICD-10-CM | POA: Diagnosis not present

## 2016-06-03 DIAGNOSIS — Z1211 Encounter for screening for malignant neoplasm of colon: Secondary | ICD-10-CM | POA: Diagnosis not present

## 2016-06-09 DIAGNOSIS — G4733 Obstructive sleep apnea (adult) (pediatric): Secondary | ICD-10-CM | POA: Diagnosis not present

## 2016-06-09 DIAGNOSIS — G4754 Parasomnia in conditions classified elsewhere: Secondary | ICD-10-CM | POA: Diagnosis not present

## 2016-07-20 ENCOUNTER — Other Ambulatory Visit: Payer: Self-pay | Admitting: Internal Medicine

## 2016-07-20 ENCOUNTER — Other Ambulatory Visit: Payer: Self-pay | Admitting: Geriatric Medicine

## 2016-07-20 DIAGNOSIS — Z1231 Encounter for screening mammogram for malignant neoplasm of breast: Secondary | ICD-10-CM

## 2016-07-23 DIAGNOSIS — Z6839 Body mass index (BMI) 39.0-39.9, adult: Secondary | ICD-10-CM | POA: Diagnosis not present

## 2016-07-23 DIAGNOSIS — R8290 Unspecified abnormal findings in urine: Secondary | ICD-10-CM | POA: Diagnosis not present

## 2016-07-23 DIAGNOSIS — I10 Essential (primary) hypertension: Secondary | ICD-10-CM | POA: Diagnosis not present

## 2016-07-23 DIAGNOSIS — E6609 Other obesity due to excess calories: Secondary | ICD-10-CM | POA: Diagnosis not present

## 2016-07-23 DIAGNOSIS — E039 Hypothyroidism, unspecified: Secondary | ICD-10-CM | POA: Diagnosis not present

## 2016-07-23 DIAGNOSIS — K219 Gastro-esophageal reflux disease without esophagitis: Secondary | ICD-10-CM | POA: Diagnosis not present

## 2016-07-23 DIAGNOSIS — F329 Major depressive disorder, single episode, unspecified: Secondary | ICD-10-CM | POA: Diagnosis not present

## 2016-07-23 DIAGNOSIS — R739 Hyperglycemia, unspecified: Secondary | ICD-10-CM | POA: Diagnosis not present

## 2016-07-23 DIAGNOSIS — G4733 Obstructive sleep apnea (adult) (pediatric): Secondary | ICD-10-CM | POA: Diagnosis not present

## 2016-08-26 ENCOUNTER — Ambulatory Visit: Payer: Medicare Other

## 2016-09-02 DIAGNOSIS — G4733 Obstructive sleep apnea (adult) (pediatric): Secondary | ICD-10-CM | POA: Diagnosis not present

## 2016-09-25 DIAGNOSIS — K648 Other hemorrhoids: Secondary | ICD-10-CM | POA: Diagnosis not present

## 2016-09-25 DIAGNOSIS — Z1211 Encounter for screening for malignant neoplasm of colon: Secondary | ICD-10-CM | POA: Diagnosis not present

## 2016-09-25 DIAGNOSIS — K5730 Diverticulosis of large intestine without perforation or abscess without bleeding: Secondary | ICD-10-CM | POA: Diagnosis not present

## 2016-09-25 DIAGNOSIS — D123 Benign neoplasm of transverse colon: Secondary | ICD-10-CM | POA: Diagnosis not present

## 2016-09-29 ENCOUNTER — Ambulatory Visit
Admission: RE | Admit: 2016-09-29 | Discharge: 2016-09-29 | Disposition: A | Discharge status: Home / Self Care | Payer: Medicare Other | Source: Ambulatory Visit | Attending: Internal Medicine | Admitting: Internal Medicine

## 2016-09-29 DIAGNOSIS — Z1231 Encounter for screening mammogram for malignant neoplasm of breast: Secondary | ICD-10-CM

## 2016-09-29 DIAGNOSIS — Z1211 Encounter for screening for malignant neoplasm of colon: Secondary | ICD-10-CM | POA: Diagnosis not present

## 2016-09-29 DIAGNOSIS — D123 Benign neoplasm of transverse colon: Secondary | ICD-10-CM | POA: Diagnosis not present

## 2016-10-07 ENCOUNTER — Emergency Department (HOSPITAL_COMMUNITY): Payer: Medicare Other

## 2016-10-07 ENCOUNTER — Encounter (HOSPITAL_COMMUNITY): Payer: Self-pay | Admitting: Emergency Medicine

## 2016-10-07 ENCOUNTER — Observation Stay (HOSPITAL_COMMUNITY)
Admission: EM | Admit: 2016-10-07 | Discharge: 2016-10-09 | Disposition: A | Discharge status: Home / Self Care | Payer: Medicare Other | Attending: Family Medicine | Admitting: Family Medicine

## 2016-10-07 DIAGNOSIS — G4730 Sleep apnea, unspecified: Secondary | ICD-10-CM | POA: Insufficient documentation

## 2016-10-07 DIAGNOSIS — I10 Essential (primary) hypertension: Secondary | ICD-10-CM | POA: Diagnosis present

## 2016-10-07 DIAGNOSIS — Z79899 Other long term (current) drug therapy: Secondary | ICD-10-CM | POA: Diagnosis not present

## 2016-10-07 DIAGNOSIS — K219 Gastro-esophageal reflux disease without esophagitis: Secondary | ICD-10-CM | POA: Diagnosis not present

## 2016-10-07 DIAGNOSIS — E785 Hyperlipidemia, unspecified: Secondary | ICD-10-CM | POA: Diagnosis present

## 2016-10-07 DIAGNOSIS — I25110 Atherosclerotic heart disease of native coronary artery with unstable angina pectoris: Secondary | ICD-10-CM | POA: Insufficient documentation

## 2016-10-07 DIAGNOSIS — R079 Chest pain, unspecified: Secondary | ICD-10-CM | POA: Diagnosis not present

## 2016-10-07 DIAGNOSIS — Z6841 Body Mass Index (BMI) 40.0 and over, adult: Secondary | ICD-10-CM | POA: Diagnosis not present

## 2016-10-07 DIAGNOSIS — F418 Other specified anxiety disorders: Secondary | ICD-10-CM | POA: Insufficient documentation

## 2016-10-07 DIAGNOSIS — H409 Unspecified glaucoma: Secondary | ICD-10-CM | POA: Insufficient documentation

## 2016-10-07 DIAGNOSIS — R739 Hyperglycemia, unspecified: Secondary | ICD-10-CM | POA: Diagnosis not present

## 2016-10-07 DIAGNOSIS — Z96643 Presence of artificial hip joint, bilateral: Secondary | ICD-10-CM | POA: Diagnosis not present

## 2016-10-07 DIAGNOSIS — E039 Hypothyroidism, unspecified: Secondary | ICD-10-CM | POA: Diagnosis not present

## 2016-10-07 DIAGNOSIS — E7800 Pure hypercholesterolemia, unspecified: Secondary | ICD-10-CM | POA: Insufficient documentation

## 2016-10-07 DIAGNOSIS — Z23 Encounter for immunization: Secondary | ICD-10-CM | POA: Insufficient documentation

## 2016-10-07 DIAGNOSIS — F319 Bipolar disorder, unspecified: Secondary | ICD-10-CM | POA: Diagnosis not present

## 2016-10-07 HISTORY — DX: Nonrheumatic mitral (valve) insufficiency: I34.0

## 2016-10-07 HISTORY — DX: Atherosclerosis of aorta: I70.0

## 2016-10-07 LAB — I-STAT TROPONIN, ED: TROPONIN I, POC: 0 ng/mL (ref 0.00–0.08)

## 2016-10-07 LAB — BASIC METABOLIC PANEL
ANION GAP: 12 (ref 5–15)
BUN: 12 mg/dL (ref 6–20)
CALCIUM: 9.8 mg/dL (ref 8.9–10.3)
CHLORIDE: 103 mmol/L (ref 101–111)
CO2: 24 mmol/L (ref 22–32)
CREATININE: 1.08 mg/dL — AB (ref 0.44–1.00)
GFR calc Af Amer: 59 mL/min — ABNORMAL LOW (ref >60)
GFR calc non Af Amer: 51 mL/min — ABNORMAL LOW (ref >60)
GLUCOSE: 160 mg/dL — AB (ref 65–99)
Potassium: 3.8 mmol/L (ref 3.5–5.1)
Sodium: 139 mmol/L (ref 135–145)

## 2016-10-07 LAB — CBC
HCT: 45.6 % (ref 36.0–46.0)
HEMOGLOBIN: 14.8 g/dL (ref 12.0–15.0)
MCH: 28.8 pg (ref 26.0–34.0)
MCHC: 32.5 g/dL (ref 30.0–36.0)
MCV: 88.9 fL (ref 78.0–100.0)
Platelets: 268 10*3/uL (ref 150–400)
RBC: 5.13 MIL/uL — ABNORMAL HIGH (ref 3.87–5.11)
RDW: 13.4 % (ref 11.5–15.5)
WBC: 7 10*3/uL (ref 4.0–10.5)

## 2016-10-07 MED ORDER — ENOXAPARIN SODIUM 40 MG/0.4ML ~~LOC~~ SOLN
40.0000 mg | SUBCUTANEOUS | Status: DC
  Filled 2016-10-07 (×2): qty 0.4

## 2016-10-07 MED ORDER — PANTOPRAZOLE SODIUM 40 MG PO TBEC
40.0000 mg | DELAYED_RELEASE_TABLET | Freq: Every day | ORAL | Status: DC
  Administered 2016-10-08 – 2016-10-09 (×2): 40 mg via ORAL
  Filled 2016-10-07 (×2): qty 1

## 2016-10-07 MED ORDER — ALPRAZOLAM 0.25 MG PO TABS
0.2500 mg | ORAL_TABLET | Freq: Two times a day (BID) | ORAL | Status: DC | PRN
  Administered 2016-10-09: 0.25 mg via ORAL
  Filled 2016-10-07: qty 1

## 2016-10-07 MED ORDER — ASPIRIN EC 325 MG PO TBEC
325.0000 mg | DELAYED_RELEASE_TABLET | Freq: Every day | ORAL | Status: DC
  Administered 2016-10-08: 325 mg via ORAL
  Filled 2016-10-07: qty 1

## 2016-10-07 MED ORDER — CALCIUM CARBONATE-VITAMIN D 500-200 MG-UNIT PO TABS
1.0000 | ORAL_TABLET | Freq: Two times a day (BID) | ORAL | Status: DC
  Administered 2016-10-08 – 2016-10-09 (×4): 1 via ORAL
  Filled 2016-10-07 (×5): qty 1

## 2016-10-07 MED ORDER — SODIUM CHLORIDE 0.9 % IV SOLN
INTRAVENOUS | Status: AC
  Administered 2016-10-07: 21:00:00 via INTRAVENOUS

## 2016-10-07 MED ORDER — ROSUVASTATIN CALCIUM 10 MG PO TABS
20.0000 mg | ORAL_TABLET | Freq: Every day | ORAL | Status: DC
  Administered 2016-10-08 – 2016-10-09 (×2): 20 mg via ORAL
  Filled 2016-10-07 (×2): qty 2

## 2016-10-07 MED ORDER — DULOXETINE HCL 30 MG PO CPEP
30.0000 mg | ORAL_CAPSULE | Freq: Every day | ORAL | Status: DC
  Administered 2016-10-08 – 2016-10-09 (×2): 30 mg via ORAL
  Filled 2016-10-07 (×2): qty 1

## 2016-10-07 MED ORDER — LORATADINE 10 MG PO TABS
10.0000 mg | ORAL_TABLET | Freq: Every day | ORAL | Status: DC
  Administered 2016-10-08 – 2016-10-09 (×2): 10 mg via ORAL
  Filled 2016-10-07 (×2): qty 1

## 2016-10-07 MED ORDER — ATENOLOL 25 MG PO TABS
25.0000 mg | ORAL_TABLET | Freq: Every day | ORAL | Status: DC
  Filled 2016-10-07: qty 1

## 2016-10-07 MED ORDER — ACETAMINOPHEN 325 MG PO TABS
650.0000 mg | ORAL_TABLET | Freq: Four times a day (QID) | ORAL | Status: DC | PRN
  Administered 2016-10-08 – 2016-10-09 (×2): 650 mg via ORAL
  Filled 2016-10-07 (×2): qty 2

## 2016-10-07 MED ORDER — ACETAMINOPHEN 650 MG RE SUPP: 650.0000 mg | Freq: Four times a day (QID) | RECTAL | Status: DC | PRN

## 2016-10-07 MED ORDER — LEVOTHYROXINE SODIUM 25 MCG PO TABS
125.0000 ug | ORAL_TABLET | Freq: Every day | ORAL | Status: DC
  Administered 2016-10-08 – 2016-10-09 (×2): 125 ug via ORAL
  Filled 2016-10-07 (×2): qty 1

## 2016-10-07 MED ORDER — VITAMIN B-12 1000 MCG PO TABS
500.0000 ug | ORAL_TABLET | Freq: Every day | ORAL | Status: DC
  Administered 2016-10-08 – 2016-10-09 (×2): 500 ug via ORAL
  Filled 2016-10-07 (×2): qty 1

## 2016-10-07 NOTE — H&P (Addendum)
TRH H&P   Patient Demographics:    Danielle [name], is a 71 y.o. female  MRN: [id number]   DOB - [birth year]  Admit Date - 10/07/2016  Outpatient Primary MD for the patient is [name], MD  Referring MD/NP/PA:    Dr. Thomasene Lot  Outpatient Specialists:       Patient coming from:    home  Chief Complaint  Patient presents with  . Chest Pain      HPI:    Danielle [name]  is a 71 y.o. female, w hypertension , hyperlipidemia, hypothyroidism, gerd, apparently c/o chest pain , sharp, substernal, starting at about 1:30 or 2pm,  felt like she needed to burp,  w radiation of pain to the jaw.  Lasted for a couple of hours. Associated with some nausea.  + diaphoresis.  Pt felt lightheaded.  Pt states that the pain resolved by itself.  Denies fever, chills, cough, palp, sob, gerd.  Pt presented to ED for evaluation of chest pain.   In ED.  CXR negative ,  EKG nsr at 75, nl axis, no st-t changes c/w ischemia. Slight pr depression?   Trop I 0.00,  Glucose 160,  Pt will be admitted for w/up of chest pain .        Review of systems:    In addition to the HPI above,  No Fever-chills, No Headache, No changes with Vision or hearing, No problems swallowing food or Liquids, No Cough or Shortness of Breath, No Abdominal pain, No Nausea or Vommitting, Bowel movements are regular, No Blood in stool or Urine, No dysuria, No new skin rashes or bruises, No new joints pains-aches,  No new weakness, tingling, numbness in any extremity, No recent weight gain or loss, No polyuria, polydypsia or polyphagia, No significant Mental Stressors.  A full 10 point Review of Systems was done, except as stated above, all other Review of Systems were negative.   With Past History of the following :    Past Medical History:  Diagnosis Date  . Arthritis 02-13-11   osteoarthritis, hips,  knees,s/p Cervical fusion(DDD)  . Atherosclerosis of aorta ([place]) 05/30/2015   on CTA chest  . Depression with anxiety   . Difficult intubation 02-13-11   with surgery -multiple times  . GERD (gastroesophageal reflux disease) 02-13-11   tx. Omeprazole  . Glaucoma 02-13-11   bil. tx. eye drops daily  . Headache(784.0) 02-13-11   past hx. migraines, none recent  . Heart murmur 02-13-11   was told-no issues with this  . Hypercholesterolemia   . Hypertension 02-13-11   tx. Atenolol  . Hypothyroidism 02-13-11   tx. with Levothyroxine  . Mitral regurgitation 06/01/2015   Mild  . Sinus drainage 02-13-11   uses Zyrtec as needed  . Sleep apnea 02-13-11   Hx. sleep apnea-uses cpap since 10'12      Past Surgical History:  Procedure Laterality Date  . ABDOMINAL HYSTERECTOMY  02-13-11  . APPENDECTOMY  02-13-11   Lap. removal '92  . CERVICAL FUSION  02-13-11   '92- retained hardware  . CHOLECYSTECTOMY  02-13-11   '98-lap. galbladder removal due to stones  . GUM SURGERY  02-13-11   "small mouth"  . JOINT REPLACEMENT  02-13-11   RTHA-a yr ago in [place]  . TONSILLECTOMY  02-13-11   child  . TOTAL HIP ARTHROPLASTY  02/17/2011   Procedure: TOTAL HIP ARTHROPLASTY ANTERIOR APPROACH;  Surgeon: Mauri Pole, MD;  Location: [facility];  Service: Orthopedics;  Laterality: Left;  . TUBAL LIGATION  02-13-11      Social History:     Social History  Substance Use Topics  . Smoking status: Never Smoker  . Smokeless tobacco: Never Used  . Alcohol use Yes     Comment: rare     Lives - at home  Mobility -  Walks by self   Family History :     Family History  Problem Relation Age of Onset  . Cancer Mother        lung  . Breast cancer Maternal Aunt    Mother had ? afib possibly.     Home Medications:   Prior to Admission medications   Medication Sig Start Date End Date Taking? Authorizing Provider  ALPRAZolam Duanne Moron) 0.25 MG tablet Take 0.25 mg  PO BID PRN ANXIETY 05/24/15  Yes [provider]    atenolol (TENORMIN) 25 MG tablet Take 25 mg by mouth every morning.     Yes [provider]  Biotin 5000 MCG CAPS Take 5,000 capsules by mouth 1 day or 1 dose.   Yes [provider]  calcium-vitamin D (OSCAL WITH D) 500-200 MG-UNIT per tablet Take 1 tablet by mouth 2 (two) times daily.     Yes [provider]  cetirizine (ZYRTEC) 10 MG tablet Take 10 mg by mouth daily as needed for allergies.    Yes [provider]  DULoxetine (CYMBALTA) 30 MG capsule Take 30 mg by mouth daily.    Yes [provider]  hydrochlorothiazide (HYDRODIURIL) 25 MG tablet Take 25 mg by mouth daily.   Yes [provider]  levothyroxine (SYNTHROID, LEVOTHROID) 125 MCG tablet Take 1 tablet (125 mcg total) by mouth daily before breakfast. 06/03/15  Yes Domenic Polite, MD  Magnesium 100 MG CAPS Take 100 mg by mouth daily.   Yes [provider]  omeprazole (PRILOSEC) 20 MG capsule Take 20 mg by mouth daily as needed (for heartburn).    Yes [provider]  ondansetron (ZOFRAN-ODT) 8 MG disintegrating tablet 8 mg as needed 05/17/15  Yes [provider]  rosuvastatin (CRESTOR) 20 MG tablet Take 20 mg by mouth daily.   Yes [provider]  vitamin B-12 (CYANOCOBALAMIN) 500 MCG tablet Take 500 mcg by mouth daily. Reported on 05/16/2015   Yes [provider]  levofloxacin (LEVAQUIN) 500 MG tablet Take 1 tablet (500 mg total) by mouth daily. For 89more days Patient not taking: Reported on 10/07/2016 06/03/15   Domenic Polite, MD  oxyCODONE-acetaminophen (ROXICET) 5-325 MG tablet Take 1 tablet by mouth every 6 (six) hours as needed for severe pain. Patient not taking: Reported on 06/05/2015 05/17/15   Cecilie Kicks, PA-C  senna-docusate (SENOKOT-S) 8.6-50 MG tablet Take 1 tablet by mouth 2 (two) times daily as needed for mild constipation. Patient not taking: Reported on 10/07/2016 06/02/15   Domenic Polite, MD  Allergies:      Allergies  Allergen Reactions  . Ceftin [Cefuroxime Axetil] Hives and Swelling    Swelling of face  . Codeine Nausea Only  . Compazine [Prochlorperazine] Swelling and Other (See Comments)    Face and tongue swelling     Physical Exam:   Vitals  Blood pressure 97/70, pulse 73, temperature 98.3 F (36.8 C), temperature source Oral, resp. rate 13, SpO2 95 %.   1. General  lying in bed in NAD,    2. Normal affect and insight, Not Suicidal or Homicidal, Awake Alert, Oriented X 3.  3. No F.N deficits, ALL C.Nerves Intact, Strength 5/5 all 4 extremities, Sensation intact all 4 extremities, Plantars down going.  4. Ears and Eyes appear Normal, Conjunctivae clear, PERRLA. Moist Oral Mucosa.  5. Supple Neck, No JVD, No cervical lymphadenopathy appriciated, No Carotid Bruits.  6. Symmetrical Chest wall movement, Good air movement bilaterally, CTAB.  7. RRR, No Gallops, Rubs or Murmurs, No Parasternal Heave.  8. Positive Bowel Sounds, Abdomen Soft, No tenderness, No organomegaly appriciated,No rebound -guarding or rigidity.  9.  No Cyanosis, Normal Skin Turgor, No Skin Rash or Bruise.  10. Good muscle tone,  joints appear normal , no effusions, Normal ROM.  11. No Palpable Lymph Nodes in Neck or Axillae     Data Review:    CBC  Recent Labs Lab 10/07/16 1542  WBC 7.0  HGB 14.8  HCT 45.6  PLT 268  MCV 88.9  MCH 28.8  MCHC 32.5  RDW 13.4   ------------------------------------------------------------------------------------------------------------------  Chemistries   Recent Labs Lab 10/07/16 1542  NA 139  K 3.8  CL 103  CO2 24  GLUCOSE 160*  BUN 12  CREATININE 1.08*  CALCIUM 9.8   ------------------------------------------------------------------------------------------------------------------ CrCl cannot be calculated (Unknown ideal  weight.). ------------------------------------------------------------------------------------------------------------------ No results for input(s): TSH, T4TOTAL, T3FREE, THYROIDAB in the last 72 hours.  Invalid input(s): FREET3  Coagulation profile No results for input(s): INR, PROTIME in the last 168 hours. ------------------------------------------------------------------------------------------------------------------- No results for input(s): DDIMER in the last 72 hours. -------------------------------------------------------------------------------------------------------------------  Cardiac Enzymes No results for input(s): CKMB, TROPONINI, MYOGLOBIN in the last 168 hours.  Invalid input(s): CK ------------------------------------------------------------------------------------------------------------------    Component Value Date/Time   BNP 11.3 05/30/2015 1730     ---------------------------------------------------------------------------------------------------------------  Urinalysis    Component Value Date/Time   COLORURINE YELLOW 05/30/2015 1744   APPEARANCEUR CLOUDY (A) 05/30/2015 1744   LABSPEC 1.019 05/30/2015 1744   PHURINE 6.0 05/30/2015 1744   GLUCOSEU NEGATIVE 05/30/2015 1744   HGBUR NEGATIVE 05/30/2015 1744   [place] 05/30/2015 1744   [place] 05/30/2015 1744   PROTEINUR NEGATIVE 05/30/2015 1744   UROBILINOGEN 0.2 02/13/2011 1018   NITRITE NEGATIVE 05/30/2015 1744   LEUKOCYTESUR MODERATE (A) 05/30/2015 1744    ----------------------------------------------------------------------------------------------------------------   Imaging Results:    Dg Chest 2 View  Result Date: 10/07/2016 CLINICAL DATA:  71 year old female with history of chest pain since this morning after drinking ginger ale. Chest pain radiates to both side of the jaw. EXAM: CHEST  2 VIEW COMPARISON:  Chest x-ray 06/27/2015. FINDINGS: Lung volumes are normal.  No consolidative airspace disease. No pleural effusions. No pneumothorax. No pulmonary nodule or mass noted. Pulmonary vasculature and the cardiomediastinal silhouette are within normal limits. Atherosclerosis in the thoracic aorta. Orthopedic fixation hardware in the lower cervical spine. IMPRESSION: 1.  No radiographic evidence of acute cardiopulmonary disease. 2. Aortic atherosclerosis. Electronically Signed   By: Vinnie Langton M.D.   On: 10/07/2016 17:10  Assessment & Plan:    Principal Problem:   Chest pain Active Problems:   Hypertension   Hypercholesterolemia   Hypothyroidism   Hyperglycemia    Chest pain Tele Trop I q6h x3 Check cardiac echo Aspirin, cont atenolol,  cont crestor  Check hga1c, lipid Cardiology consult by email   Hypertension Hold hydrochlorothiazide due to soft bp  Hyperlipidemia Cont crestor  Hyperglycemia Check hga1c  Hypothyroidism Check tsh Cont levothroxine     DVT Prophylaxis Lovenox, SCDs   AM Labs Ordered, also please review Full Orders  Family Communication: Admission, patients condition and plan of care including tests being ordered have been discussed with the patient who indicate understanding and agree with the plan and Code Status.  Code Status FULL CODE  Likely DC to   home  Condition GUARDED   Consults called: cardiology by email   Admission status: observation  Time spent in minutes : 45 minutes   Jani Gravel M.D on 10/07/2016 at 8:29 PM  Between 7am to 7pm - Pager - [phone]. After 7pm go to [email or web address] - password [facility]  [facility] - Office  [phone]

## 2016-10-07 NOTE — ED Provider Notes (Signed)
[facility] DEPT Provider Note   CSN: [id number] Arrival date & time: 10/07/16  1444     History   Chief Complaint Chief Complaint  Patient presents with  . Chest Pain    HPI Danielle [name] is a 71 y.o. female.  HPI   Very well-appearing 71 year old female presenting with chest pain. Patient had heaviness to her central chest radiating to bilateral jaws. This happened at 2 PM. When she had chest pain she felt a little bit nauseated. No dipahoreiss or SOB.  Resolved roughly on arrival to ED, at 3:30 PM.patient not having pain since then. Has not had any chest pain on days leading up to this. No diaphoresis. Patient has past medical history significant for hypertension, hypercholesterolemia no diabetes no early family cardiac death.  Past Medical History:  Diagnosis Date  . Arthritis 02-13-11   osteoarthritis, hips, knees,s/p Cervical fusion(DDD)  . Atherosclerosis of aorta ([place]) 05/30/2015   on CTA chest  . Depression with anxiety   . Difficult intubation 02-13-11   with surgery -multiple times  . GERD (gastroesophageal reflux disease) 02-13-11   tx. Omeprazole  . Glaucoma 02-13-11   bil. tx. eye drops daily  . Headache(784.0) 02-13-11   past hx. migraines, none recent  . Heart murmur 02-13-11   was told-no issues with this  . Hypercholesterolemia   . Hypertension 02-13-11   tx. Atenolol  . Hypothyroidism 02-13-11   tx. with Levothyroxine  . Mitral regurgitation 06/01/2015   Mild  . Sinus drainage 02-13-11   uses Zyrtec as needed  . Sleep apnea 02-13-11   Hx. sleep apnea-uses cpap since 10'12    Patient Active Problem List   Diagnosis Date Noted  . Chest pain 10/07/2016  . Hyperglycemia 10/07/2016  . Acute respiratory failure with hypoxia ([place]) 05/30/2015  . Atypical pneumonia 05/30/2015  . Dyspnea 05/30/2015  . Hypercholesterolemia   . Gastroesophageal reflux disease without esophagitis   . Depression with anxiety   . Tibial plateau fracture 05/16/2015  . Closed  tibial fracture 05/16/2015  . Sebaceous cyst of breast 07/27/2013  . 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  . Sleep apnea 02/13/2011  . Hypertension 02/13/2011  . GERD (gastroesophageal reflux disease) 02/13/2011  . Hypothyroidism 02/13/2011    Past Surgical History:  Procedure Laterality Date  . ABDOMINAL HYSTERECTOMY  02-13-11  . APPENDECTOMY  02-13-11   Lap. removal '92  . CERVICAL FUSION  02-13-11   '92- retained hardware  . CHOLECYSTECTOMY  02-13-11   '98-lap. galbladder removal due to stones  . GUM SURGERY  02-13-11   "small mouth"  . JOINT REPLACEMENT  02-13-11   RTHA-a yr ago in [place]  . TONSILLECTOMY  02-13-11   child  . TOTAL HIP ARTHROPLASTY  02/17/2011   Procedure: TOTAL HIP ARTHROPLASTY ANTERIOR APPROACH;  Surgeon: Mauri Pole, MD;  Location: [facility];  Service: Orthopedics;  Laterality: Left;  . TUBAL LIGATION  02-13-11    OB History    No data available       Home Medications    Prior to Admission medications   Medication Sig Start Date End Date Taking? Authorizing Provider  ALPRAZolam Duanne Moron) 0.25 MG tablet Take 0.25 mg  PO BID PRN ANXIETY 05/24/15  Yes [provider]  atenolol (TENORMIN) 25 MG tablet Take 25 mg by mouth every morning.     Yes [provider]  Biotin 5000 MCG CAPS Take 5,000 capsules by mouth 1 day or 1 dose.   Yes [provider]  calcium-vitamin D (OSCAL WITH D) 500-200 MG-UNIT per tablet Take 1 tablet by mouth 2 (two) times daily.     Yes [provider]  cetirizine (ZYRTEC) 10 MG tablet Take 10 mg by mouth daily as needed for allergies.    Yes [provider]  DULoxetine (CYMBALTA) 30 MG capsule Take 30 mg by mouth daily.    Yes [provider]  hydrochlorothiazide (HYDRODIURIL) 25 MG tablet Take 25 mg by mouth daily.   Yes [provider]  levothyroxine (SYNTHROID, LEVOTHROID) 125 MCG tablet Take 1 tablet (125 mcg total) by mouth daily before breakfast. 06/03/15  Yes Domenic Polite, MD    Magnesium 100 MG CAPS Take 100 mg by mouth daily.   Yes [provider]  omeprazole (PRILOSEC) 20 MG capsule Take 20 mg by mouth daily as needed (for heartburn).    Yes [provider]  ondansetron (ZOFRAN-ODT) 8 MG disintegrating tablet 8 mg as needed 05/17/15  Yes [provider]  rosuvastatin (CRESTOR) 20 MG tablet Take 20 mg by mouth daily.   Yes [provider]  vitamin B-12 (CYANOCOBALAMIN) 500 MCG tablet Take 500 mcg by mouth daily. Reported on 05/16/2015   Yes [provider]  levofloxacin (LEVAQUIN) 500 MG tablet Take 1 tablet (500 mg total) by mouth daily. For 69more days Patient not taking: Reported on 10/07/2016 06/03/15   Domenic Polite, MD  oxyCODONE-acetaminophen (ROXICET) 5-325 MG tablet Take 1 tablet by mouth every 6 (six) hours as needed for severe pain. Patient not taking: Reported on 06/05/2015 05/17/15   Cecilie Kicks, PA-C  senna-docusate (SENOKOT-S) 8.6-50 MG tablet Take 1 tablet by mouth 2 (two) times daily as needed for mild constipation. Patient not taking: Reported on 10/07/2016 06/02/15   Domenic Polite, MD    Family History Family History  Problem Relation Age of Onset  . Cancer Mother        lung  . Breast cancer Maternal Aunt     Social History Social History  Substance Use Topics  . Smoking status: Never Smoker  . Smokeless tobacco: Never Used  . Alcohol use Yes     Comment: rare     Allergies   Ceftin [cefuroxime axetil]; Codeine; and Compazine [prochlorperazine]   Review of Systems Review of Systems  Constitutional: Negative for activity change, fatigue and fever.  Respiratory: Positive for chest tightness. Negative for shortness of breath.   Cardiovascular: Positive for chest pain.  Gastrointestinal: Negative for abdominal pain.  All other systems reviewed and are negative.    Physical Exam Updated Vital Signs BP 127/60 (BP Location: Left Arm)   Pulse 84   Temp 98 F (36.7 C) (Oral)   Resp  18   Ht 5\' 2"  (1.575 m)   Wt 103.3 kg (227 lb 11.2 oz)   SpO2 96%   BMI 41.65 kg/m   Physical Exam  Constitutional: She is oriented to person, place, and time. She appears well-developed and well-nourished.  HENT:  Head: Normocephalic and atraumatic.  Eyes: Right eye exhibits no discharge. Left eye exhibits no discharge.  Cardiovascular: Normal rate and regular rhythm.   Pulmonary/Chest: Effort normal and breath sounds normal. No respiratory distress. She has no wheezes.  Abdominal: Soft. She exhibits no distension. There is no tenderness.  Neurological: She is oriented to person, place, and time.  Skin: Skin is warm and dry. She is not diaphoretic.  Psychiatric: She has a normal mood and affect.  Nursing note and vitals reviewed.  ED Treatments / Results  Labs (all labs ordered are listed, but only abnormal results are displayed) Labs Reviewed  BASIC METABOLIC PANEL - Abnormal; Notable for the following:       Result Value   Glucose, Bld 160 (*)    Creatinine, Ser 1.08 (*)    GFR calc non Af Amer 51 (*)    GFR calc Af Amer 59 (*)    All other components within normal limits  CBC - Abnormal; Notable for the following:    RBC 5.13 (*)    All other components within normal limits  LIPID PANEL  HEMOGLOBIN A1C  COMPREHENSIVE METABOLIC PANEL  CBC  TSH  TROPONIN I  TROPONIN I  TROPONIN I  I-STAT TROPONIN, ED    EKG  EKG Interpretation  Date/Time:  Wednesday October 07 2016 14:46:31 EDT Ventricular Rate:  74 PR Interval:  202 QRS Duration: 88 QT Interval:  416 QTC Calculation: 461 R Axis:   44 Text Interpretation:  Normal sinus rhythm Normal ECG Normal sinus rhythm Confirmed by Thomasene Lot, Ladasha Schnackenberg [phone]) on 10/07/2016 7:34:07 PM       Radiology Dg Chest 2 View  Result Date: 10/07/2016 CLINICAL DATA:  71 year old female with history of chest pain since this morning after drinking ginger ale. Chest pain radiates to both side of the jaw. EXAM: CHEST  2 VIEW  COMPARISON:  Chest x-ray 06/27/2015. FINDINGS: Lung volumes are normal. No consolidative airspace disease. No pleural effusions. No pneumothorax. No pulmonary nodule or mass noted. Pulmonary vasculature and the cardiomediastinal silhouette are within normal limits. Atherosclerosis in the thoracic aorta. Orthopedic fixation hardware in the lower cervical spine. IMPRESSION: 1.  No radiographic evidence of acute cardiopulmonary disease. 2. Aortic atherosclerosis. Electronically Signed   By: Vinnie Langton M.D.   On: 10/07/2016 17:10    Procedures Procedures (including critical care time)  Medications Ordered in ED Medications  ALPRAZolam (XANAX) tablet 0.25 mg (not administered)  levothyroxine (SYNTHROID, LEVOTHROID) tablet 125 mcg (not administered)  rosuvastatin (CRESTOR) tablet 20 mg (not administered)  atenolol (TENORMIN) tablet 25 mg (not administered)  vitamin B-12 (CYANOCOBALAMIN) tablet 500 mcg (not administered)  DULoxetine (CYMBALTA) DR capsule 30 mg (not administered)  calcium-vitamin D (OSCAL WITH D) 500-200 MG-UNIT per tablet 1 tablet (not administered)  loratadine (CLARITIN) tablet 10 mg (not administered)  pantoprazole (PROTONIX) EC tablet 40 mg (not administered)  enoxaparin (LOVENOX) injection 40 mg (not administered)  acetaminophen (TYLENOL) tablet 650 mg (not administered)    Or  acetaminophen (TYLENOL) suppository 650 mg (not administered)  aspirin EC tablet 325 mg (not administered)  0.9 %  sodium chloride infusion ( Intravenous New Bag/Given 10/07/16 2101)     Initial Impression / Assessment and Plan / ED Course  I have reviewed the triage vital signs and the nursing notes.  Pertinent labs & imaging results that were available during my care of the patient were reviewed by me and considered in my medical decision making (see chart for details).     Very well-appearing 71 year old female presenting with chest pain. Patient had heaviness to her central chest  radiating to bilateral jaws. This happened at 2 PM. When she had chest pain she felt a little bit nauseated. No dipahoreiss or SOB.  Resolved roughly on arrival to ED, at 3:30 PM.patient not having pain since then. Has not had any chest pain on days leading up to this. No diaphoresis. Patient has past medical history significant for hypertension, hypercholesterolemia no diabetes no early family cardiac death.  11:13 PM Patient's story concerning for ischemic heart. Heart score is 6. Will admit for serial troponins.   Final Clinical Impressions(s) / ED Diagnoses   Final diagnoses:  None    New Prescriptions Current Discharge Medication List       Macarthur Critchley, MD 10/07/16 2313

## 2016-10-07 NOTE — ED Notes (Signed)
ED Provider at bedside. 

## 2016-10-07 NOTE — ED Notes (Signed)
Pt states around 2pm today she had sharp pressure in her chest that radiated up to her jaw bilaterally, described as "tightness". Pain had subsided about 20 mins after arriving to the ED. Pt denies having this type of episode before

## 2016-10-07 NOTE — ED Triage Notes (Signed)
Pt c/o cheswt pain that started this am after drinking ginger ale-- radiating up both sides of jaw. Continues to have pain in jaw.

## 2016-10-08 ENCOUNTER — Observation Stay (HOSPITAL_COMMUNITY): Payer: Medicare Other

## 2016-10-08 ENCOUNTER — Other Ambulatory Visit (HOSPITAL_COMMUNITY): Payer: Medicare Other

## 2016-10-08 DIAGNOSIS — I10 Essential (primary) hypertension: Secondary | ICD-10-CM | POA: Diagnosis not present

## 2016-10-08 DIAGNOSIS — E7800 Pure hypercholesterolemia, unspecified: Secondary | ICD-10-CM | POA: Diagnosis not present

## 2016-10-08 DIAGNOSIS — I2510 Atherosclerotic heart disease of native coronary artery without angina pectoris: Secondary | ICD-10-CM | POA: Diagnosis not present

## 2016-10-08 DIAGNOSIS — R079 Chest pain, unspecified: Secondary | ICD-10-CM

## 2016-10-08 LAB — TROPONIN I
Troponin I: <0.03 ng/mL (ref <0.03)
Troponin I: <0.03 ng/mL (ref <0.03)

## 2016-10-08 LAB — CBC
HEMATOCRIT: 40.9 % (ref 36.0–46.0)
Hemoglobin: 13.4 g/dL (ref 12.0–15.0)
MCH: 29.8 pg (ref 26.0–34.0)
MCHC: 32.8 g/dL (ref 30.0–36.0)
MCV: 90.9 fL (ref 78.0–100.0)
PLATELETS: 256 10*3/uL (ref 150–400)
RBC: 4.5 MIL/uL (ref 3.87–5.11)
RDW: 13.5 % (ref 11.5–15.5)
WBC: 6.3 10*3/uL (ref 4.0–10.5)

## 2016-10-08 LAB — COMPREHENSIVE METABOLIC PANEL
ALBUMIN: 3.4 g/dL — AB (ref 3.5–5.0)
ALT: 17 U/L (ref 14–54)
AST: 16 U/L (ref 15–41)
Alkaline Phosphatase: 73 U/L (ref 38–126)
Anion gap: 9 (ref 5–15)
BUN: 14 mg/dL (ref 6–20)
CHLORIDE: 103 mmol/L (ref 101–111)
CO2: 28 mmol/L (ref 22–32)
CREATININE: 0.88 mg/dL (ref 0.44–1.00)
Calcium: 9.2 mg/dL (ref 8.9–10.3)
GFR calc Af Amer: >60 mL/min (ref >60)
GLUCOSE: 100 mg/dL — AB (ref 65–99)
POTASSIUM: 3.5 mmol/L (ref 3.5–5.1)
Sodium: 140 mmol/L (ref 135–145)
Total Bilirubin: 0.8 mg/dL (ref 0.3–1.2)
Total Protein: 5.8 g/dL — ABNORMAL LOW (ref 6.5–8.1)

## 2016-10-08 LAB — LIPID PANEL
CHOL/HDL RATIO: 2.7 ratio
CHOLESTEROL: 170 mg/dL (ref 0–200)
HDL: 63 mg/dL (ref >40)
LDL Cholesterol: 86 mg/dL (ref 0–99)
Triglycerides: 105 mg/dL (ref <150)
VLDL: 21 mg/dL (ref 0–40)

## 2016-10-08 LAB — HEMOGLOBIN A1C
HEMOGLOBIN A1C: 5.6 % (ref 4.8–5.6)
MEAN PLASMA GLUCOSE: 114.02 mg/dL

## 2016-10-08 LAB — PROTIME-INR
INR: 0.99
Prothrombin Time: 13 seconds (ref 11.4–15.2)

## 2016-10-08 LAB — TSH: TSH: 0.446 u[IU]/mL (ref 0.350–4.500)

## 2016-10-08 MED ORDER — METOPROLOL TARTRATE 5 MG/5ML IV SOLN
INTRAVENOUS | Status: AC
  Filled 2016-10-08: qty 15

## 2016-10-08 MED ORDER — SODIUM CHLORIDE 0.9% FLUSH: 3.0000 mL | INTRAVENOUS | Status: DC | PRN

## 2016-10-08 MED ORDER — IOPAMIDOL (ISOVUE-370) INJECTION 76%
INTRAVENOUS | Status: AC
  Administered 2016-10-08: 100 mL
  Filled 2016-10-08: qty 100

## 2016-10-08 MED ORDER — INFLUENZA VAC SPLIT HIGH-DOSE 0.5 ML IM SUSY
0.5000 mL | PREFILLED_SYRINGE | INTRAMUSCULAR | Status: AC
  Administered 2016-10-09: 0.5 mL via INTRAMUSCULAR
  Filled 2016-10-08 (×2): qty 0.5

## 2016-10-08 MED ORDER — SODIUM CHLORIDE 0.9 % IV SOLN: 250.0000 mL | INTRAVENOUS | Status: DC | PRN

## 2016-10-08 MED ORDER — METOPROLOL TARTRATE 25 MG PO TABS
25.0000 mg | ORAL_TABLET | Freq: Once | ORAL | Status: AC
  Administered 2016-10-08: 25 mg via ORAL
  Filled 2016-10-08: qty 1

## 2016-10-08 MED ORDER — METOPROLOL TARTRATE 25 MG PO TABS
25.0000 mg | ORAL_TABLET | Freq: Every day | ORAL | Status: DC
  Administered 2016-10-09: 25 mg via ORAL
  Filled 2016-10-08: qty 1

## 2016-10-08 MED ORDER — SODIUM CHLORIDE 0.9 % WEIGHT BASED INFUSION
3.0000 mL/kg/h | INTRAVENOUS | Status: AC
  Administered 2016-10-09: 3 mL/kg/h via INTRAVENOUS

## 2016-10-08 MED ORDER — NITROGLYCERIN 0.4 MG SL SUBL
0.8000 mg | SUBLINGUAL_TABLET | Freq: Once | SUBLINGUAL | Status: AC
  Administered 2016-10-08: 0.8 mg via SUBLINGUAL

## 2016-10-08 MED ORDER — ASPIRIN 81 MG PO CHEW
81.0000 mg | CHEWABLE_TABLET | ORAL | Status: AC
  Administered 2016-10-09: 81 mg via ORAL
  Filled 2016-10-08: qty 1

## 2016-10-08 MED ORDER — LISINOPRIL 2.5 MG PO TABS
2.5000 mg | ORAL_TABLET | Freq: Every day | ORAL | Status: DC
  Administered 2016-10-08 – 2016-10-09 (×2): 2.5 mg via ORAL
  Filled 2016-10-08 (×2): qty 1

## 2016-10-08 MED ORDER — ASPIRIN EC 81 MG PO TBEC: 81.0000 mg | DELAYED_RELEASE_TABLET | Freq: Every day | ORAL | Status: DC

## 2016-10-08 MED ORDER — PANTOPRAZOLE SODIUM 40 MG PO TBEC: 40.0000 mg | DELAYED_RELEASE_TABLET | Freq: Two times a day (BID) | ORAL | 0 refills | Status: DC

## 2016-10-08 MED ORDER — SODIUM CHLORIDE 0.9% FLUSH
3.0000 mL | Freq: Two times a day (BID) | INTRAVENOUS | Status: DC
  Administered 2016-10-08 – 2016-10-09 (×2): 3 mL via INTRAVENOUS

## 2016-10-08 MED ORDER — SODIUM CHLORIDE 0.9 % WEIGHT BASED INFUSION
1.0000 mL/kg/h | INTRAVENOUS | Status: DC
  Administered 2016-10-09: 1 mL/kg/h via INTRAVENOUS

## 2016-10-08 MED ORDER — BIOTIN 5000 MCG PO CAPS: 1.0000 | ORAL_CAPSULE | ORAL | 0 refills | Status: DC

## 2016-10-08 MED ORDER — NITROGLYCERIN 0.4 MG SL SUBL
SUBLINGUAL_TABLET | SUBLINGUAL | Status: AC
  Filled 2016-10-08: qty 2

## 2016-10-08 MED ORDER — METOPROLOL TARTRATE 5 MG/5ML IV SOLN
5.0000 mg | INTRAVENOUS | Status: DC | PRN
  Administered 2016-10-08 (×3): 5 mg via INTRAVENOUS

## 2016-10-08 NOTE — Progress Notes (Signed)
Ok to have snack prior to cardiac ct per Rosaria Ferries PA.  Patient requested pudding.

## 2016-10-08 NOTE — Care Management Obs Status (Signed)
[facility] NOTIFICATION   Patient Details  Name: Danielle [name] MRN: [id number] Date of Birth: [birth year]   Medicare Observation Status Notification Given:  Yes    Bethena Roys, RN 10/08/2016, 2:53 PM

## 2016-10-08 NOTE — Discharge Summary (Addendum)
Physician Discharge Summary  Danielle [name]:[id number] DOB: April 10, 1945 DOA: 10/07/2016  PCP: [name], MD  Admit date: 10/07/2016 Discharge date: 10/08/2016  Time spent: 35 minutes  Recommendations for Outpatient Follow-up:  1. Recommend outpatient follow-up with primary care physician and possible referral to GI for reflux 2. Recommend outpatient primary care and weight loss counseling  Discharge Diagnoses:  Principal Problem:   Chest pain Active Problems:   Hypertension   Hypercholesterolemia   Hypothyroidism   Hyperglycemia   Morbid obesity ([place])   Discharge Condition: improved  Diet recommendation: heart healthy low-salt  Filed Weights   10/07/16 2234  Weight: 103.3 kg (227 lb 11.2 oz)    History of present illness:  71 wfem  Body mass index is 41.65 kg/m. htn hld hypergly admnit with crushing CP Found to have no elevated troponin, Subtle EKg changes  CT Cor chets after Card Consult conisdered high risk  Hospital Course:  Angina, UA                   CT cor per verbal report Dr. Meda Coffee high risk--over read of the test however did not show as much restriction to flow             atenolol 25--> changing to Toprol 25 twice daily             nitro prn  Added Lisinopril 2.5 on d/c  htn             Cont HCTZ 25 qd  Reflux Cont pantoprazole 40  Body mass index is 41.65 kg/m.             OP screening and wght loss stategies  Bipolar  cont cymbalta 30  Consultants:   cardiology  Procedures:   CT Cor  Echocardiogram 9/28 = EF 55-60%  Discharge Exam: Vitals:   10/08/16 1632 10/08/16 1643  BP: 136/70 (!) 119/57  Pulse:    Resp:    Temp:    SpO2:      General: eomi ncat Cardiovascular: s1 s2 no m/r/g  Respiratory: cta b  abd soft nt nd no rebound  Discharge Instructions   Discharge Instructions    Diet - low sodium heart healthy    Complete by:  As directed    Discharge instructions    Complete by:  As  directed    It was not felt that your chest pain was cardiac You will need to increase your meds for stomach issues to protonix x 2 per day for acid reduction Best of luck   Increase activity slowly    Complete by:  As directed      Current Discharge Medication List    START taking these medications   Details  lisinopril (PRINIVIL,ZESTRIL) 2.5 MG tablet Take 1 tablet (2.5 mg total) by mouth daily. Qty: 30 tablet, Refills: 0    metoprolol tartrate (LOPRESSOR) 25 MG tablet Take 0.5 tablets (12.5 mg total) by mouth 2 (two) times daily. Qty: 60 tablet, Refills: 0    pantoprazole (PROTONIX) 40 MG tablet Take 1 tablet (40 mg total) by mouth 2 (two) times daily. Qty: 60 tablet, Refills: 0      CONTINUE these medications which have CHANGED   Details  Biotin 5000 MCG CAPS Take 1 capsule (5,000 mcg total) by mouth 1 day or 1 dose. Qty: 30 capsule, Refills: 0      CONTINUE these medications which have NOT CHANGED   Details  ALPRAZolam (XANAX) 0.25 MG  tablet Take 0.25 mg  PO BID PRN ANXIETY Refills: 0    calcium-vitamin D (OSCAL WITH D) 500-200 MG-UNIT per tablet Take 1 tablet by mouth 2 (two) times daily.      cetirizine (ZYRTEC) 10 MG tablet Take 10 mg by mouth daily as needed for allergies.     DULoxetine (CYMBALTA) 30 MG capsule Take 30 mg by mouth daily.     hydrochlorothiazide (HYDRODIURIL) 25 MG tablet Take 25 mg by mouth daily.    levothyroxine (SYNTHROID, LEVOTHROID) 125 MCG tablet Take 1 tablet (125 mcg total) by mouth daily before breakfast. Qty: 30 tablet, Refills: 0    Magnesium 100 MG CAPS Take 100 mg by mouth daily.    ondansetron (ZOFRAN-ODT) 8 MG disintegrating tablet 8 mg as needed Refills: 1    rosuvastatin (CRESTOR) 20 MG tablet Take 20 mg by mouth daily.    vitamin B-12 (CYANOCOBALAMIN) 500 MCG tablet Take 500 mcg by mouth daily. Reported on 05/16/2015    levofloxacin (LEVAQUIN) 500 MG tablet Take 1 tablet (500 mg total) by mouth daily. For 58more  days Qty: 4 tablet, Refills: 0    oxyCODONE-acetaminophen (ROXICET) 5-325 MG tablet Take 1 tablet by mouth every 6 (six) hours as needed for severe pain. Qty: 40 tablet, Refills: 0    senna-docusate (SENOKOT-S) 8.6-50 MG tablet Take 1 tablet by mouth 2 (two) times daily as needed for mild constipation. Qty: 10 tablet, Refills: 0      STOP taking these medications     atenolol (TENORMIN) 25 MG tablet      omeprazole (PRILOSEC) 20 MG capsule        Allergies  Allergen Reactions  . Ceftin [Cefuroxime Axetil] Hives and Swelling    Swelling of face  . Codeine Nausea Only  . Compazine [Prochlorperazine] Swelling and Other (See Comments)    Face and tongue swelling      The results of significant diagnostics from this hospitalization (including imaging, microbiology, ancillary and laboratory) are listed below for reference.    Significant Diagnostic Studies: Dg Chest 2 View  Result Date: 10/07/2016 CLINICAL DATA:  71 year old female with history of chest pain since this morning after drinking ginger ale. Chest pain radiates to both side of the jaw. EXAM: CHEST  2 VIEW COMPARISON:  Chest x-ray 06/27/2015. FINDINGS: Lung volumes are normal. No consolidative airspace disease. No pleural effusions. No pneumothorax. No pulmonary nodule or mass noted. Pulmonary vasculature and the cardiomediastinal silhouette are within normal limits. Atherosclerosis in the thoracic aorta. Orthopedic fixation hardware in the lower cervical spine. IMPRESSION: 1.  No radiographic evidence of acute cardiopulmonary disease. 2. Aortic atherosclerosis. Electronically Signed   By: Vinnie Langton M.D.   On: 10/07/2016 17:10   Ct Coronary Morph W/cta Cor W/score W/ca W/cm &/or Wo/cm  Result Date: 10/08/2016 EXAM: OVER-READ INTERPRETATION  CT CHEST The following report is an over-read performed by radiologist Dr. Evangeline Dakin of [facility], [place] on 10/08/2016. This over-read does not include interpretation  of cardiac or coronary anatomy or pathology. The coronary calcium score/coronary CTA interpretation by the cardiologist is attached. COMPARISON:  05/30/2015. FINDINGS: Cardiovascular: Moderate atherosclerosis involving the descending thoracic and upper abdominal aorta without evidence of aneurysm. Mediastinum/Nodes: No pathologic lymphadenopathy in the visualized mediastinum. Visualized esophagus unremarkable. Lungs/Pleura: Scarring in the lower lobes and peripherally in the left upper lobe at the site of prior pneumonia, unchanged. Visualized lungs otherwise clear. No pleural effusions. Upper Abdomen: Diffuse steatosis involving the visualized liver. Visualized upper abdomen otherwise unremarkable  for the early arterial phase of enhancement. Musculoskeletal: Mid and lower thoracic spondylosis. IMPRESSION: 1. Diffuse steatosis involving the visualized liver. 2. Scarring in the lower lobes and peripherally in the left upper lobe. No acute abnormalities involving the visualized lungs. 3.  Aortic Atherosclerosis (ICD10-170.0) Electronically Signed   By: Evangeline Dakin M.D.   On: 10/08/2016 16:58   Mm Screening Breast Tomo Bilateral  Result Date: 09/29/2016 CLINICAL DATA:  Screening. EXAM: 2D DIGITAL SCREENING BILATERAL MAMMOGRAM WITH CAD AND ADJUNCT TOMO COMPARISON:  Previous exam(s). ACR Breast Density Category b: There are scattered areas of fibroglandular density. FINDINGS: There are no findings suspicious for malignancy. Images were processed with CAD. IMPRESSION: No mammographic evidence of malignancy. A result letter of this screening mammogram will be mailed directly to the patient. RECOMMENDATION: Screening mammogram in one year. (Code:SM-B-01Y) BI-RADS CATEGORY  1: Negative. Electronically Signed   By: Lajean Manes M.D.   On: 09/29/2016 11:33    Microbiology: No results found for this or any previous visit (from the past 240 hour(s)).   Labs: Basic Metabolic Panel:  Recent Labs Lab  10/07/16 1542 10/08/16 0413  NA 139 140  K 3.8 3.5  CL 103 103  CO2 24 28  GLUCOSE 160* 100*  BUN 12 14  CREATININE 1.08* 0.88  CALCIUM 9.8 9.2   Liver Function Tests:  Recent Labs Lab 10/08/16 0413  AST 16  ALT 17  ALKPHOS 73  BILITOT 0.8  PROT 5.8*  ALBUMIN 3.4*   No results for input(s): LIPASE, AMYLASE in the last 168 hours. No results for input(s): AMMONIA in the last 168 hours. CBC:  Recent Labs Lab 10/07/16 1542 10/08/16 0413  WBC 7.0 6.3  HGB 14.8 13.4  HCT 45.6 40.9  MCV 88.9 90.9  PLT 268 256   Cardiac Enzymes:  Recent Labs Lab 10/07/16 2251 10/08/16 0413 10/08/16 1016  TROPONINI <0.03 <0.03 <0.03   BNP: BNP (last 3 results) No results for input(s): BNP in the last 8760 hours.  ProBNP (last 3 results) No results for input(s): PROBNP in the last 8760 hours.  CBG: No results for input(s): GLUCAP in the last 168 hours.     SignedNita Sells MD   [facility] 10/08/2016, 5:38 PM

## 2016-10-08 NOTE — Consult Note (Signed)
Cardiology Consultation:   Patient ID: Danielle [name]; [id number]; Dec 18, 1945   Admit date: 10/07/2016 Date of Consult: 10/08/2016  Primary Care Provider: [name], MD Primary Cardiologist: New  Primary Electrophysiologist:  n/a   Patient Profile:   Danielle [name] is a 71 y.o. female with a hx of HTN, HLD, Hypothyroid, GERD, depression, anxiety, OSA on CPAP, who is being seen today for the evaluation of chest pain at the request of Dr Maudie Mercury.  Hx admit 05/2015 w/ acute resp failure and hypoxia felt 2nd atypical infection and D-CHF, EF nl w/ grade 1 dd on echo. Pt offered cardiology eval as outpt and declined.   History of Present Illness:   Danielle [name] has never had a cardiology eval.  She has fatigue all the time. This is not new or different.  Yesterday about 2 pm, she drank a little ginger ale and was going out to eat. She was riding in a car and had sudden onset of severe pressure in her chest. The sx worsened and worsened, up to 8-9/10. The pain went up into both sides of her jaw. She tried to eat, but the tightness in her neck made it difficult. She was nauseated, no vomiting. She was clammy, felt presyncope, no SOB.  She and her husband came to the ER, the pain gradually subsided. She did not get anything for the pain, **NO ASA WAS GIVEN**.  The pain stayed at a low level, 3/10, but finally reached a 0/10 by about 7 pm. She has had indigestion and heartburn before, these sx were different. She has never had these sx before.   She does not exercise. She has MS issues with bilateral THR and a bad knee. She does not do anything strenuous. She does vacuum, can do this for 30 minutes without stopping.   Her L foot swells often, R foot occasionally, no PND as long as she wears the CPAP. She has some orthopnea, has been sleeping on 2 pillows for a year.   She had gained a great deal of weight over time. She and her husband were working to lose weight, she had dropped  13 lbs. However, she gained it all back.    Past Medical History:  Diagnosis Date  . Arthritis 02-13-11   osteoarthritis, hips, knees,s/p Cervical fusion(DDD)  . Atherosclerosis of aorta ([place]) 05/30/2015   on CTA chest  . Depression with anxiety   . Difficult intubation 02-13-11   with surgery -multiple times  . GERD (gastroesophageal reflux disease) 02-13-11   tx. Omeprazole  . Glaucoma 02-13-11   bil. tx. eye drops daily  . Headache(784.0) 02-13-11   past hx. migraines, none recent  . Heart murmur 02-13-11   was told-no issues with this  . Hypercholesterolemia   . Hypertension 02-13-11   tx. Atenolol  . Hypothyroidism 02-13-11   tx. with Levothyroxine  . Mitral regurgitation 06/01/2015   Mild  . Sinus drainage 02-13-11   uses Zyrtec as needed  . Sleep apnea 02-13-11   Hx. sleep apnea-uses cpap since 10'12    Past Surgical History:  Procedure Laterality Date  . ABDOMINAL HYSTERECTOMY  02-13-11  . APPENDECTOMY  02-13-11   Lap. removal '92  . CERVICAL FUSION  02-13-11   '92- retained hardware  . CHOLECYSTECTOMY  02-13-11   '98-lap. galbladder removal due to stones  . GUM SURGERY  02-13-11   "small mouth"  . JOINT REPLACEMENT  02-13-11   RTHA-a yr ago in [place]  . TONSILLECTOMY  02-13-11   child  . TOTAL HIP ARTHROPLASTY  02/17/2011   Procedure: TOTAL HIP ARTHROPLASTY ANTERIOR APPROACH;  Surgeon: Mauri Pole, MD;  Location: [facility];  Service: Orthopedics;  Laterality: Left;  . TUBAL LIGATION  02-13-11     Inpatient Medications: Scheduled Meds: . aspirin EC  325 mg Oral Daily  . atenolol  25 mg Oral Daily  . calcium-vitamin D  1 tablet Oral BID WC  . DULoxetine  30 mg Oral Daily  . enoxaparin (LOVENOX) injection  40 mg Subcutaneous Q24H  . [START ON 10/09/2016] Influenza vac split quadrivalent PF  0.5 mL Intramuscular Tomorrow-1000  . levothyroxine  125 mcg Oral QAC breakfast  . loratadine  10 mg Oral Daily  . pantoprazole  40 mg Oral Daily  . rosuvastatin  20 mg Oral Daily  . vitamin  B-12  500 mcg Oral Daily   Continuous Infusions:  PRN Meds: acetaminophen **OR** acetaminophen, ALPRAZolam Prior to Admission medications   Medication Sig Start Date End Date Taking? Authorizing Provider  ALPRAZolam Duanne Moron) 0.25 MG tablet Take 0.25 mg  PO BID PRN ANXIETY 05/24/15  Yes [provider]  atenolol (TENORMIN) 25 MG tablet Take 25 mg by mouth every morning.     Yes [provider]  Biotin 5000 MCG CAPS Take 5,000 capsules by mouth 1 day or 1 dose.   Yes [provider]  calcium-vitamin D (OSCAL WITH D) 500-200 MG-UNIT per tablet Take 1 tablet by mouth 2 (two) times daily.     Yes [provider]  cetirizine (ZYRTEC) 10 MG tablet Take 10 mg by mouth daily as needed for allergies.    Yes [provider]  DULoxetine (CYMBALTA) 30 MG capsule Take 30 mg by mouth daily.    Yes [provider]  hydrochlorothiazide (HYDRODIURIL) 25 MG tablet Take 25 mg by mouth daily.   Yes [provider]  levothyroxine (SYNTHROID, LEVOTHROID) 125 MCG tablet Take 1 tablet (125 mcg total) by mouth daily before breakfast. 06/03/15  Yes Domenic Polite, MD  Magnesium 100 MG CAPS Take 100 mg by mouth daily.   Yes [provider]  omeprazole (PRILOSEC) 20 MG capsule Take 20 mg by mouth daily as needed (for heartburn).    Yes [provider]  ondansetron (ZOFRAN-ODT) 8 MG disintegrating tablet 8 mg as needed 05/17/15  Yes [provider]  rosuvastatin (CRESTOR) 20 MG tablet Take 20 mg by mouth daily.   Yes [provider]  vitamin B-12 (CYANOCOBALAMIN) 500 MCG tablet Take 500 mcg by mouth daily. Reported on 05/16/2015   Yes [provider]  levofloxacin (LEVAQUIN) 500 MG tablet Take 1 tablet (500 mg total) by mouth daily. For 26more days Patient not taking: Reported on 10/07/2016 06/03/15   Domenic Polite, MD  oxyCODONE-acetaminophen (ROXICET) 5-325 MG tablet Take 1 tablet by mouth every 6 (six) hours as needed  for severe pain. Patient not taking: Reported on 06/05/2015 05/17/15   Cecilie Kicks, PA-C  senna-docusate (SENOKOT-S) 8.6-50 MG tablet Take 1 tablet by mouth 2 (two) times daily as needed for mild constipation. Patient not taking: Reported on 10/07/2016 06/02/15   Domenic Polite, MD    Allergies:    Allergies  Allergen Reactions  . Ceftin [Cefuroxime Axetil] Hives and Swelling    Swelling of face  . Codeine Nausea Only  . Compazine [Prochlorperazine] Swelling and Other (See Comments)    Face and tongue swelling    Social History:   Social History  Social History  . Marital status: Widowed    Spouse name: N/A  . Number of children: N/A  . Years of education: N/A   Occupational History  . Not on file.   Social History Main Topics  . Smoking status: Never Smoker  . Smokeless tobacco: Never Used  . Alcohol use Yes     Comment: rare  . Drug use: No  . Sexual activity: Yes   Other Topics Concern  . Not on file   Social History Narrative  . No narrative on file    Family History:   The patient's family history includes Breast cancer in her maternal aunt; Cancer in her mother. Pt indicated that her mother is deceased. She indicated that her father is deceased. She indicated that the status of her maternal aunt is unknown.    ROS:  Please see the history of present illness.  All other ROS reviewed and negative.      Physical Exam/Data:   Vitals:   10/07/16 2059 10/07/16 2130 10/07/16 2234 10/08/16 0754  BP: (!) 113/54 (!) 98/58 127/60 113/67  Pulse: 77 82 84   Resp: 16 18 18    Temp:   98 F (36.7 C) 98.2 F (36.8 C)  TempSrc:   Oral Oral  SpO2: 94% 95% 96% 96%  Weight:   227 lb 11.2 oz (103.3 kg)   Height:   5\' 2"  (1.575 m)     Intake/Output Summary (Last 24 hours) at 10/08/16 1118 Last data filed at 10/08/16 0300  Gross per 24 hour  Intake           448.75 ml  Output              450 ml  Net            -1.25 ml   Filed Weights   10/07/16 2234    Weight: 227 lb 11.2 oz (103.3 kg)   Body mass index is 41.65 kg/m.  General:  Well nourished, well developed, in no acute distress HEENT: normal Lymph: no adenopathy Neck: no JVD seen, difficult to assess 2nd body habitus Endocrine:  No thryomegaly Vascular: No carotid bruits; FA pulses 2+ bilaterally without bruits  Cardiac:  normal S1, S2; RRR; no murmur  Lungs:  clear to auscultation bilaterally, no wheezing, rhonchi or rales  Abd: soft, nontender, no hepatomegaly  Ext: no edema Musculoskeletal:  No deformities, BUE and BLE strength normal and equal Skin: warm and dry  Neuro:  CNs 2-12 intact, no focal abnormalities noted Psych:  Normal affect   EKG:  The EKG was personally reviewed and demonstrates: SR, no acute ischemic changes, no Q waves, normal intervals Telemetry:  Telemetry was personally reviewed and demonstrates:  SR  Relevant CV Studies:  ECHO: Ordered  ECHO: 06/01/2015 - Left ventricle: The cavity size was normal. Wall thickness was   increased in a pattern of mild LVH. Systolic function was normal.   The estimated ejection fraction was in the range of 55% to 60%.   Wall motion was normal; there were no regional wall motion   abnormalities. Doppler parameters are consistent with abnormal   left ventricular relaxation (grade 1 diastolic dysfunction). - Mitral valve: There was mild regurgitation.  Laboratory Data:  Chemistry Recent Labs Lab 10/07/16 1542 10/08/16 0413  NA 139 140  K 3.8 3.5  CL 103 103  CO2 24 28  GLUCOSE 160* 100*  BUN 12 14  CREATININE 1.08* 0.88  CALCIUM 9.8 9.2  GFRNONAA  51* >60  GFRAA 59* >60  ANIONGAP 12 9     Recent Labs Lab 10/08/16 0413  PROT 5.8*  ALBUMIN 3.4*  AST 16  ALT 17  ALKPHOS 73  BILITOT 0.8   Hematology Recent Labs Lab 10/07/16 1542 10/08/16 0413  WBC 7.0 6.3  RBC 5.13* 4.50  HGB 14.8 13.4  HCT 45.6 40.9  MCV 88.9 90.9  MCH 28.8 29.8  MCHC 32.5 32.8  RDW 13.4 13.5  PLT 268 256   Cardiac  Enzymes Recent Labs Lab 10/07/16 2251 10/08/16 0413  TROPONINI <0.03 <0.03    Recent Labs Lab 10/07/16 1531  TROPIPOC 0.00     Radiology/Studies:  Dg Chest 2 View  Result Date: 10/07/2016 CLINICAL DATA:  71 year old female with history of chest pain since this morning after drinking ginger ale. Chest pain radiates to both side of the jaw. EXAM: CHEST  2 VIEW COMPARISON:  Chest x-ray 06/27/2015. FINDINGS: Lung volumes are normal. No consolidative airspace disease. No pleural effusions. No pneumothorax. No pulmonary nodule or mass noted. Pulmonary vasculature and the cardiomediastinal silhouette are within normal limits. Atherosclerosis in the thoracic aorta. Orthopedic fixation hardware in the lower cervical spine. IMPRESSION: 1.  No radiographic evidence of acute cardiopulmonary disease. 2. Aortic atherosclerosis. Electronically Signed   By: Vinnie Langton M.D.   On: 10/07/2016 17:10    Assessment and Plan:   Principal Problem: 1.  Chest pain - no hx exertional sx, sx started at rest - no acute ECG changes, ez neg MI - however, pt has mult CRFs - will get cardiac CT today, Dr Meda Coffee to read.   Otherwise, per IM Active Problems:   Hypertension   Hypercholesterolemia   Hypothyroidism   Hyperglycemia     Signed, Rosaria Ferries, PA-C  10/08/2016 11:18 AM

## 2016-10-08 NOTE — Plan of Care (Signed)
Problem: Education: Goal: Knowledge of [facility] Education information/materials will improve Outcome: Progressing Pt agreed to have flu vaccine this admission.   Problem: Pain Managment: Goal: General experience of comfort will improve Outcome: Progressing Patient has been pain free since arrival to unit.

## 2016-10-08 NOTE — Progress Notes (Signed)
PROGRESS NOTE    Danielle [name]  DGL:[id number] DOB: 13-Nov-1945 DOA: 10/07/2016 PCP: [name], MD   Specialists:     Brief Narrative:  80 wfem  Body mass index is 41.65 kg/m. htn hldhypergly admnit with crushing CP Found to have no elevated troponin, Subtle EKg changes  CT Cor chets after Card Consult conisdered high risk  Assessment & Plan:   Principal Problem:   Chest pain Active Problems:   Hypertension   Hypercholesterolemia   Hypothyroidism   Hyperglycemia   Morbid obesity ([facility])   Angina, UA   CT cor per verbal report Dr. Meda Coffee high risk  Needs Cath  atenolol 25--> changing to Toprol 25 qd  nitro prn  htn  Cont HCTZ 25 qd  Reflux Cont pantoprazole 40  Body mass index is 41.65 kg/m.  OP screening and wght loss stategies  Bipolar  cont cymbalta 30    lovenox Full code inpt   Consultants:   cardiology  Procedures:   CT Cor    Subjective:  No cp and was walking   Objective: Vitals:   10/08/16 1240 10/08/16 1620 10/08/16 1632 10/08/16 1643  BP: (!) 115/47 133/72 136/70 (!) 119/57  Pulse:      Resp:      Temp:      TempSrc:      SpO2:      Weight:      Height:        Intake/Output Summary (Last 24 hours) at 10/08/16 1751 Last data filed at 10/08/16 0800  Gross per 24 hour  Intake           628.75 ml  Output              450 ml  Net           178.75 ml   Filed Weights   10/07/16 2234  Weight: 103.3 kg (227 lb 11.2 oz)    Examination:  eomi ncat CTA b Abd soft No LE edema s1 s2 no m/r/g Neuro intact  Data Reviewed: I have personally reviewed following labs and imaging studies  CBC:  Recent Labs Lab 10/07/16 1542 10/08/16 0413  WBC 7.0 6.3  HGB 14.8 13.4  HCT 45.6 40.9  MCV 88.9 90.9  PLT 268 518   Basic Metabolic Panel:  Recent Labs Lab 10/07/16 1542 10/08/16 0413  NA 139 140  K 3.8 3.5  CL 103 103  CO2 24 28  GLUCOSE 160* 100*  BUN 12 14  CREATININE 1.08* 0.88  CALCIUM  9.8 9.2   GFR: Estimated Creatinine Clearance: 67.1 mL/min (by C-G formula based on SCr of 0.88 mg/dL). Liver Function Tests:  Recent Labs Lab 10/08/16 0413  AST 16  ALT 17  ALKPHOS 73  BILITOT 0.8  PROT 5.8*  ALBUMIN 3.4*   No results for input(s): LIPASE, AMYLASE in the last 168 hours. No results for input(s): AMMONIA in the last 168 hours. Coagulation Profile: No results for input(s): INR, PROTIME in the last 168 hours. Cardiac Enzymes:  Recent Labs Lab 10/07/16 2251 10/08/16 0413 10/08/16 1016  TROPONINI <0.03 <0.03 <0.03   BNP (last 3 results) No results for input(s): PROBNP in the last 8760 hours. HbA1C:  Recent Labs  10/08/16 0413  HGBA1C 5.6   CBG: No results for input(s): GLUCAP in the last 168 hours. Lipid Profile:  Recent Labs  10/08/16 0413  CHOL 170  HDL 63  LDLCALC 86  TRIG 105  CHOLHDL 2.7   Thyroid  Function Tests:  Recent Labs  10/07/16 2251  TSH 0.446   Anemia Panel: No results for input(s): VITAMINB12, FOLATE, FERRITIN, TIBC, IRON, RETICCTPCT in the last 72 hours. Urine analysis:    Component Value Date/Time   COLORURINE YELLOW 05/30/2015 1744   APPEARANCEUR CLOUDY (A) 05/30/2015 1744   LABSPEC 1.019 05/30/2015 1744   PHURINE 6.0 05/30/2015 1744   GLUCOSEU NEGATIVE 05/30/2015 1744   HGBUR NEGATIVE 05/30/2015 1744   BILIRUBINUR NEGATIVE 05/30/2015 [place] 05/30/2015 1744   PROTEINUR NEGATIVE 05/30/2015 1744   UROBILINOGEN 0.2 02/13/2011 1018   NITRITE NEGATIVE 05/30/2015 1744   LEUKOCYTESUR MODERATE (A) 05/30/2015 1744     Radiology Studies: Reviewed images personally in health database    Scheduled Meds: . [START ON 10/09/2016] aspirin EC  81 mg Oral Daily  . atenolol  25 mg Oral Daily  . calcium-vitamin D  1 tablet Oral BID WC  . DULoxetine  30 mg Oral Daily  . enoxaparin (LOVENOX) injection  40 mg Subcutaneous Q24H  . [START ON 10/09/2016] Influenza vac split quadrivalent PF  0.5 mL  Intramuscular Tomorrow-1000  . levothyroxine  125 mcg Oral QAC breakfast  . loratadine  10 mg Oral Daily  . metoprolol tartrate      . nitroGLYCERIN      . pantoprazole  40 mg Oral Daily  . rosuvastatin  20 mg Oral Daily  . vitamin B-12  500 mcg Oral Daily   Continuous Infusions:   LOS: 0 days    Time spent: [place], MD [facility] ([facility]   If 7PM-7AM, please contact night-coverage [email or web address] Password [facility] 10/08/2016, 5:51 PM

## 2016-10-08 NOTE — Progress Notes (Signed)
Coronary CT-A is concerning for LAD ischemia.  Will plan for [facility] tomorrow.  Risks and benefits of cardiac catheterization have been discussed with the patient.  The patient understands that risks included but are not limited to stroke (1 in 1000), death (1 in 39), kidney failure [usually temporary] (1 in 500), bleeding (1 in 200), allergic reaction [possibly serious] (1 in 200). The patient understands and agrees to proceed.   Orris Perin C. Oval Linsey, MD, [facility]  10/08/2016 6:36 PM

## 2016-10-09 ENCOUNTER — Encounter (HOSPITAL_COMMUNITY)
Admission: EM | Disposition: A | Discharge status: Home / Self Care | Payer: Self-pay | Source: Home / Self Care | Attending: Physician Assistant

## 2016-10-09 ENCOUNTER — Observation Stay (HOSPITAL_BASED_OUTPATIENT_CLINIC_OR_DEPARTMENT_OTHER): Payer: Medicare Other

## 2016-10-09 DIAGNOSIS — E7800 Pure hypercholesterolemia, unspecified: Secondary | ICD-10-CM | POA: Diagnosis not present

## 2016-10-09 DIAGNOSIS — I2510 Atherosclerotic heart disease of native coronary artery without angina pectoris: Secondary | ICD-10-CM | POA: Diagnosis not present

## 2016-10-09 DIAGNOSIS — R079 Chest pain, unspecified: Secondary | ICD-10-CM | POA: Diagnosis not present

## 2016-10-09 DIAGNOSIS — I10 Essential (primary) hypertension: Secondary | ICD-10-CM | POA: Diagnosis not present

## 2016-10-09 LAB — ECHOCARDIOGRAM COMPLETE
Height: 62 in
Weight: 3632 oz

## 2016-10-09 SURGERY — LEFT HEART CATH AND CORONARY ANGIOGRAPHY
Anesthesia: LOCAL

## 2016-10-09 MED ORDER — LISINOPRIL 2.5 MG PO TABS: 2.5000 mg | ORAL_TABLET | Freq: Every day | ORAL | 0 refills | Status: DC

## 2016-10-09 MED ORDER — METOPROLOL TARTRATE 25 MG PO TABS: 12.5000 mg | ORAL_TABLET | Freq: Two times a day (BID) | ORAL | 0 refills | Status: DC

## 2016-10-09 NOTE — Progress Notes (Signed)
Progress Note  Patient Name: Danielle [name] Date of Encounter: 10/09/2016  Primary Cardiologist: Dr. Oval Linsey (new)  Subjective   Feeling well.  Apprehensive about LHC.  No recurrent chest pain.   Inpatient Medications    Scheduled Meds: . aspirin EC  81 mg Oral Daily  . calcium-vitamin D  1 tablet Oral BID WC  . DULoxetine  30 mg Oral Daily  . enoxaparin (LOVENOX) injection  40 mg Subcutaneous Q24H  . Influenza vac split quadrivalent PF  0.5 mL Intramuscular Tomorrow-1000  . levothyroxine  125 mcg Oral QAC breakfast  . lisinopril  2.5 mg Oral Daily  . loratadine  10 mg Oral Daily  . metoprolol tartrate  25 mg Oral Daily  . pantoprazole  40 mg Oral Daily  . rosuvastatin  20 mg Oral Daily  . sodium chloride flush  3 mL Intravenous Q12H  . vitamin B-12  500 mcg Oral Daily   Continuous Infusions: . sodium chloride    . sodium chloride 1 mL/kg/hr (10/09/16 0819)   PRN Meds: sodium chloride, acetaminophen **OR** acetaminophen, ALPRAZolam, metoprolol tartrate, sodium chloride flush   Vital Signs    Vitals:   10/08/16 1915 10/08/16 2100 10/09/16 0552 10/09/16 0818  BP: 122/65 130/73 112/66 126/72  Pulse:  71 71   Resp:  18 18   Temp:  98.3 F (36.8 C) 97.9 F (36.6 C)   TempSrc:  Oral Oral   SpO2:  95% 96%   Weight:   103 kg (227 lb)   Height:        Intake/Output Summary (Last 24 hours) at 10/09/16 0933 Last data filed at 10/08/16 2100  Gross per 24 hour  Intake              775 ml  Output                0 ml  Net              775 ml   Filed Weights   10/07/16 2234 10/09/16 0552  Weight: 103.3 kg (227 lb 11.2 oz) 103 kg (227 lb)    Telemetry    Sinus rhythm.  No events.  - Personally Reviewed  ECG    N/a  Personally Reviewed  Physical Exam   GEN: Well-appearing.  No acute distress.   Neck: No JVD Cardiac: RRR, no murmurs, rubs, or gallops.  Respiratory: Clear to auscultation bilaterally. GI: Soft, nontender, non-distended  MS: No edema; No  deformity. Neuro:  Nonfocal  Psych: Normal affect   Labs    Chemistry Recent Labs Lab 10/07/16 1542 10/08/16 0413  NA 139 140  K 3.8 3.5  CL 103 103  CO2 24 28  GLUCOSE 160* 100*  BUN 12 14  CREATININE 1.08* 0.88  CALCIUM 9.8 9.2  PROT  --  5.8*  ALBUMIN  --  3.4*  AST  --  16  ALT  --  17  ALKPHOS  --  73  BILITOT  --  0.8  GFRNONAA 51* >60  GFRAA 59* >60  ANIONGAP 12 9     Hematology Recent Labs Lab 10/07/16 1542 10/08/16 0413  WBC 7.0 6.3  RBC 5.13* 4.50  HGB 14.8 13.4  HCT 45.6 40.9  MCV 88.9 90.9  MCH 28.8 29.8  MCHC 32.5 32.8  RDW 13.4 13.5  PLT 268 256    Cardiac Enzymes Recent Labs Lab 10/07/16 2251 10/08/16 0413 10/08/16 1016  TROPONINI <0.03 <0.03 <0.03  Recent Labs Lab 10/07/16 1531  TROPIPOC 0.00     BNPNo results for input(s): BNP, PROBNP in the last 168 hours.   DDimer No results for input(s): DDIMER in the last 168 hours.   Radiology    Dg Chest 2 View  Result Date: 10/07/2016 CLINICAL DATA:  71 year old female with history of chest pain since this morning after drinking ginger ale. Chest pain radiates to both side of the jaw. EXAM: CHEST  2 VIEW COMPARISON:  Chest x-ray 06/27/2015. FINDINGS: Lung volumes are normal. No consolidative airspace disease. No pleural effusions. No pneumothorax. No pulmonary nodule or mass noted. Pulmonary vasculature and the cardiomediastinal silhouette are within normal limits. Atherosclerosis in the thoracic aorta. Orthopedic fixation hardware in the lower cervical spine. IMPRESSION: 1.  No radiographic evidence of acute cardiopulmonary disease. 2. Aortic atherosclerosis. Electronically Signed   By: Vinnie Langton M.D.   On: 10/07/2016 17:10   Ct Coronary Morph W/cta Cor W/score W/ca W/cm &/or Wo/cm  Addendum Date: 10/09/2016   ADDENDUM REPORT: 10/09/2016 08:35 ADDENDUM: Additional reconstructed phases evaluated and the mid LAD segment appears to have only mild mixed plaque with associated  stenosis 25-50%. Ena Dawley Electronically Signed   By: Ena Dawley   On: 10/09/2016 08:35   Addendum Date: 10/08/2016   ADDENDUM REPORT: 10/08/2016 17:53 CLINICAL DATA:  71 year old female with hypertension, hyperlipidemia and chest pain. EXAM: Cardiac/Coronary  CT TECHNIQUE: The patient was scanned on a [employer]. FINDINGS: A 120 kV prospective scan was triggered in the descending thoracic aorta at 111 HU's. Axial non-contrast 3 mm slices were carried out through the heart. The data set was analyzed on a dedicated work station and scored using the Scott. Gantry rotation speed was 250 msecs and collimation was .6 mm. 15 mg of iv Metoprolol and 0.8 mg of sl NTG was given. The 3D data set was reconstructed in 5% intervals of the 67-82 % of the R-R cycle. Diastolic phases were analyzed on a dedicated work station using MPR, MIP and VRT modes. The patient received 80 cc of contrast. Aorta:  Normal size.  No calcifications.  No dissection. Aortic Valve:  Trileaflet.  No calcifications. Coronary Arteries:  Normal coronary origin.  Right dominance. RCA is a large dominant artery that gives rise to PDA and PLVB. There is mild diffuse predominantly non-calcified plaque in the proximal to mid RCA with maximum stenosis 25-50%. Left main is a large artery that gives rise to LAD and LCX arteries. There is no plaque. LAD is a large vessel that gives rise to one diagonal artery. Proximal LAD has mild calcified plaque with associated stenosis 25-50%. Mild LAD has complex long predominantly non-calcified plaque with 50-69% stenosis and suspicion for a focal stenosis > 70%. LCX is a non-dominant artery that gives rise to one OM1 branch. There is minimal plaque. Other findings: Normal pulmonary vein drainage into the left atrium. Normal let atrial appendage without a thrombus. Normal size of the pulmonary artery. IMPRESSION: 1. Coronary calcium score of 75. This was 18 percentile for age and sex  matched control. 2. Normal coronary origin with right dominance. 3. Mild CAD in the proximal to mid RCA and in the proximal LAD, moderate mixed plaque in the mid LAD associated with 50-60% stenosis and suspicion for a focal > 70% stenosis. A cardiac cath is recommended. Ena Dawley Electronically Signed   By: Ena Dawley   On: 10/08/2016 17:53   Result Date: 10/09/2016 EXAM: OVER-READ INTERPRETATION  CT CHEST  The following report is an over-read performed by radiologist Dr. Evangeline Dakin of [facility], [place] on 10/08/2016. This over-read does not include interpretation of cardiac or coronary anatomy or pathology. The coronary calcium score/coronary CTA interpretation by the cardiologist is attached. COMPARISON:  05/30/2015. FINDINGS: Cardiovascular: Moderate atherosclerosis involving the descending thoracic and upper abdominal aorta without evidence of aneurysm. Mediastinum/Nodes: No pathologic lymphadenopathy in the visualized mediastinum. Visualized esophagus unremarkable. Lungs/Pleura: Scarring in the lower lobes and peripherally in the left upper lobe at the site of prior pneumonia, unchanged. Visualized lungs otherwise clear. No pleural effusions. Upper Abdomen: Diffuse steatosis involving the visualized liver. Visualized upper abdomen otherwise unremarkable for the early arterial phase of enhancement. Musculoskeletal: Mid and lower thoracic spondylosis. IMPRESSION: 1. Diffuse steatosis involving the visualized liver. 2. Scarring in the lower lobes and peripherally in the left upper lobe. No acute abnormalities involving the visualized lungs. 3.  Aortic Atherosclerosis (ICD10-170.0) Electronically Signed: By: Evangeline Dakin M.D. On: 10/08/2016 16:58   Ct Coronary Fractional Flow Reserve Fluid Analysis  Result Date: 10/09/2016 EXAM: FF/RCT ANALYSIS FINDINGS: FFRct analysis was performed on the original cardiac CT angiogram dataset. Diagrammatic representation of the FFRct analysis is  provided in a separate PDF document in PACS. This dictation was created using the PDF document and an interactive 3D model of the results. 3D model is not available in the EMR/PACS. Normal FFR range is >0.80. 1. Left Main:  No significant stenosis. 2. LAD: No significant stenosis. 3. LCX: No significant stenosis. 4. RCA: No significant stenosis. IMPRESSION: 1.  CT FFR analysis didn't show any significant stenosis. Ena Dawley Electronically Signed   By: Ena Dawley   On: 10/09/2016 08:25    Cardiac Studies   Coronary CT-A 10/08/16: 25-50% RCA and LAD stenosis.  FFR of the LAD >0.8.   Patient Profile     Ms. Jearld Adjutant is a 76F with hyperension, hyperlipidemia, hypothyroidism, OSA, GERD, and morbid obesity here with an episode of chest pain.  Assessment & Plan    # Atypical chest pain:  # Non-obstructive CAD:  # Hyperlipidemia: CT-A of the coronaries initially showed a 70% LAD lesion.  However, upon further review and after using FFR (fractional flow reserve), it appears that this obstruction is <70%.  Therefore, no LHC today.  We will pursue aggressive risk factor modification and follow up as an outpatient. Echo pending.  I have called and they will do it this AM.  - Continue aspirin 81mg  daily - Increase rosuvastatin to 40mg  - Continue metoprolol - Recommend exercising at least 30-40 minutes most days of the week  # Hypertension: BP well-controlled on metoprolol and rosuvastation   For questions or updates, please contact [facility] Please consult [email or web address] for contact info under Cardiology/STEMI.      Signed, Skeet Latch, MD  10/09/2016, 9:33 AM

## 2016-10-09 NOTE — Progress Notes (Signed)
Discharge instructions reviewed with pt. Pt has no questions at this time. Pt denies any pain and stated she will call her PCP to schedule to an appointment.

## 2016-10-09 NOTE — Progress Notes (Signed)
  Echocardiogram 2D Echocardiogram has been performed.  [name] 10/09/2016, 11:36 AM

## 2016-10-15 DIAGNOSIS — K219 Gastro-esophageal reflux disease without esophagitis: Secondary | ICD-10-CM | POA: Diagnosis not present

## 2016-10-15 DIAGNOSIS — Z6841 Body Mass Index (BMI) 40.0 and over, adult: Secondary | ICD-10-CM | POA: Diagnosis not present

## 2016-10-15 DIAGNOSIS — I10 Essential (primary) hypertension: Secondary | ICD-10-CM | POA: Diagnosis not present

## 2016-10-15 DIAGNOSIS — R072 Precordial pain: Secondary | ICD-10-CM | POA: Diagnosis not present

## 2016-10-15 DIAGNOSIS — F329 Major depressive disorder, single episode, unspecified: Secondary | ICD-10-CM | POA: Diagnosis not present

## 2016-10-15 DIAGNOSIS — E785 Hyperlipidemia, unspecified: Secondary | ICD-10-CM | POA: Diagnosis not present

## 2016-10-15 DIAGNOSIS — I519 Heart disease, unspecified: Secondary | ICD-10-CM | POA: Diagnosis not present

## 2016-10-19 NOTE — Progress Notes (Signed)
Cardiology Office Note    Date:  10/20/2016   ID:  Danielle, [name] 1945-11-02, MRN [id number]  PCP:  [name], MD  Cardiologist: Dr. Oval Linsey   Chief Complaint  Patient presents with  . Hospitalization Follow-up    History of Present Illness:    Danielle [name] is a 71 y.o. female with past medical history of HTN, HLD, Hypothyroidism, GERD, and OSA (on CPAP) who presents to the office today for hospital follow-up.   She was recently admitted to [facility] from 9/26 - 10/08/2016 for evaluation of chest pain. Reported developing sudden onset chest pain after consuming ginger ale. Cardiac enzymes remained negative and EKG showed no acute ischemic changes. An echocardiogram was performed and showed a preserved EF of 55-60% with no regional WMA. A Coronary CTA was obtained and initially showed a 70% LAD lesion but with FFR, there was no significant stenosis and no further ischemic evaluation was pursued. Aggressive risk factor modification was pursued with ASA, Rosuvastatin 40mg  daily, and Lopressor.   In talking with the patient today, she denies any repeat episodes of chest discomfort since her recent hospitalization. Breathing has been at baseline. No orthopnea, PND, or lower extremity edema. She has noticed significant fatigue since being started on Lopressor. This was occurring with Atenolol prior to her admission and she reports her symptoms are significantly worse at this time. Says her heart rate has been in the 40's to 50's at times. Denies any associated lightheadedness, dizziness, or presyncope.   Since starting Lisinopril, she has also noticed a dry cough which is worse in the afternoon hours. She has not checked blood pressure since hospital discharge but it is well-controlled at 115/76 during today's visit.   Past Medical History:  Diagnosis Date  . Arthritis 02-13-11   osteoarthritis, hips, knees,s/p Cervical fusion(DDD)  . Atherosclerosis of aorta ([place])  05/30/2015   on CTA chest  . CAD (coronary artery disease)    a. 09/2016: Coronary CT showing mid-LAD plaque with 20-50% associated stenosis but no significant stenosis by FFR analysis.   . Depression with anxiety   . Difficult intubation 02-13-11   with surgery -multiple times  . GERD (gastroesophageal reflux disease) 02-13-11   tx. Omeprazole  . Glaucoma 02-13-11   bil. tx. eye drops daily  . Headache(784.0) 02-13-11   past hx. migraines, none recent  . Heart murmur 02-13-11   was told-no issues with this  . Hypercholesterolemia   . Hypertension 02/13/2011  . Hypothyroidism 02-13-11   tx. with Levothyroxine  . Mitral regurgitation 06/01/2015   Mild  . Sinus drainage 02-13-11   uses Zyrtec as needed  . Sleep apnea 02-13-11   Hx. sleep apnea-uses cpap since 10'12    Past Surgical History:  Procedure Laterality Date  . ABDOMINAL HYSTERECTOMY  02-13-11  . APPENDECTOMY  02-13-11   Lap. removal '92  . CERVICAL FUSION  02-13-11   '92- retained hardware  . CHOLECYSTECTOMY  02-13-11   '98-lap. galbladder removal due to stones  . GUM SURGERY  02-13-11   "small mouth"  . JOINT REPLACEMENT  02-13-11   RTHA-a yr ago in [place]  . TONSILLECTOMY  02-13-11   child  . TOTAL HIP ARTHROPLASTY  02/17/2011   Procedure: TOTAL HIP ARTHROPLASTY ANTERIOR APPROACH;  Surgeon: Mauri Pole, MD;  Location: [facility];  Service: Orthopedics;  Laterality: Left;  . TUBAL LIGATION  02-13-11    Current Medications: Outpatient Medications Prior to Visit  Medication Sig Dispense Refill  .  ALPRAZolam (XANAX) 0.25 MG tablet Take 0.25 mg  PO BID PRN ANXIETY  0  . Biotin 5000 MCG CAPS Take 1 capsule (5,000 mcg total) by mouth 1 day or 1 dose. 30 capsule 0  . cetirizine (ZYRTEC) 10 MG tablet Take 10 mg by mouth daily as needed for allergies.     . DULoxetine (CYMBALTA) 30 MG capsule Take 30 mg by mouth daily.     . hydrochlorothiazide (HYDRODIURIL) 25 MG tablet Take 25 mg by mouth daily.    Marland Kitchen levothyroxine (SYNTHROID, LEVOTHROID) 125  MCG tablet Take 1 tablet (125 mcg total) by mouth daily before breakfast. 30 tablet 0  . Magnesium 100 MG CAPS Take 100 mg by mouth daily.    . pantoprazole (PROTONIX) 40 MG tablet Take 1 tablet (40 mg total) by mouth 2 (two) times daily. 60 tablet 0  . rosuvastatin (CRESTOR) 20 MG tablet Take 20 mg by mouth daily.    . vitamin B-12 (CYANOCOBALAMIN) 500 MCG tablet Take 500 mcg by mouth daily. Reported on 05/16/2015    . lisinopril (PRINIVIL,ZESTRIL) 2.5 MG tablet Take 1 tablet (2.5 mg total) by mouth daily. 30 tablet 0  . metoprolol tartrate (LOPRESSOR) 25 MG tablet Take 0.5 tablets (12.5 mg total) by mouth 2 (two) times daily. 60 tablet 0  . calcium-vitamin D (OSCAL WITH D) 500-200 MG-UNIT per tablet Take 1 tablet by mouth 2 (two) times daily.      Marland Kitchen levofloxacin (LEVAQUIN) 500 MG tablet Take 1 tablet (500 mg total) by mouth daily. For 65more days (Patient not taking: Reported on 10/07/2016) 4 tablet 0  . ondansetron (ZOFRAN-ODT) 8 MG disintegrating tablet 8 mg as needed  1  . oxyCODONE-acetaminophen (ROXICET) 5-325 MG tablet Take 1 tablet by mouth every 6 (six) hours as needed for severe pain. (Patient not taking: Reported on 06/05/2015) 40 tablet 0  . senna-docusate (SENOKOT-S) 8.6-50 MG tablet Take 1 tablet by mouth 2 (two) times daily as needed for mild constipation. (Patient not taking: Reported on 10/07/2016) 10 tablet 0   No facility-administered medications prior to visit.      Allergies:   Ceftin [cefuroxime axetil]; Codeine; and Compazine [prochlorperazine]   Social History   Social History  . Marital status: Widowed    Spouse name: N/A  . Number of children: N/A  . Years of education: N/A   Social History Main Topics  . Smoking status: Never Smoker  . Smokeless tobacco: Never Used  . Alcohol use Yes     Comment: rare  . Drug use: No  . Sexual activity: Yes   Other Topics Concern  . None   Social History Narrative  . None     Family History:  The patient's family history  includes Breast cancer in her maternal aunt; Cancer in her mother.   Review of Systems:   Please see the history of present illness.     General:  No chills, fever, night sweats or weight changes. Positive for fatigue.  Cardiovascular:  No chest pain, dyspnea on exertion, edema, orthopnea, palpitations, paroxysmal nocturnal dyspnea. Dermatological: No rash, lesions/masses Respiratory: No dyspnea. Positive for dry cough.  Urologic: No hematuria, dysuria Abdominal:   No nausea, vomiting, diarrhea, bright red blood per rectum, melena, or hematemesis Neurologic:  No visual changes, wkns, changes in mental status. All other systems reviewed and are otherwise negative except as noted above.   Physical Exam:    VS:  BP 115/76   Pulse 70   Ht 5\' 2"  (  1.575 m)   Wt 229 lb 3.2 oz (104 kg)   BMI 41.92 kg/m    General: Well developed, obese Caucasian female appearing in no acute distress. Head: Normocephalic, atraumatic, sclera non-icteric, no xanthomas, nares are without discharge.  Neck: No carotid bruits. JVD not elevated.  Lungs: Respirations regular and unlabored, without wheezes or rales.  Heart: Regular rate and rhythm. No S3 or S4.  No murmur, no rubs, or gallops appreciated. Abdomen: Soft, non-tender, non-distended with normoactive bowel sounds. No hepatomegaly. No rebound/guarding. No obvious abdominal masses. Msk:  Strength and tone appear normal for age. No joint deformities or effusions. Extremities: No clubbing or cyanosis. No lower extremity edema.  Distal pedal pulses are 2+ bilaterally. Neuro: Alert and oriented X 3. Moves all extremities spontaneously. No focal deficits noted. Psych:  Responds to questions appropriately with a normal affect. Skin: No rashes or lesions noted  Wt Readings from Last 3 Encounters:  10/20/16 229 lb 3.2 oz (104 kg)  10/09/16 227 lb (103 kg)  05/30/15 225 lb 8.5 oz (102.3 kg)     Studies/Labs Reviewed:   EKG:  EKG is not ordered today.     Recent Labs: 10/07/2016: TSH 0.446 10/08/2016: ALT 17; BUN 14; Creatinine, Ser 0.88; Hemoglobin 13.4; Platelets 256; Potassium 3.5; Sodium 140   Lipid Panel    Component Value Date/Time   CHOL 170 10/08/2016 0413   TRIG 105 10/08/2016 0413   HDL 63 10/08/2016 0413   CHOLHDL 2.7 10/08/2016 0413   VLDL 21 10/08/2016 0413   LDLCALC 86 10/08/2016 0413    Additional studies/ records that were reviewed today include:   Echocardiogram: 09/2016 Study Conclusions  - Left ventricle: The cavity size was normal. Systolic function was   normal. The estimated ejection fraction was in the range of 55%   to 60%. Wall motion was normal; there were no regional wall   motion abnormalities. Left ventricular diastolic function   parameters were normal.  Coronary CT: 10/08/2016  ADDENDUM: Additional reconstructed phases evaluated and the mid LAD segment appears to have only mild mixed plaque with associated stenosis 25-50%.  1. Left Main:  No significant stenosis.  2. LAD: No significant stenosis. 3. LCX: No significant stenosis. 4. RCA: No significant stenosis.  IMPRESSION: 1.  CT FFR analysis didn't show any significant stenosis.   Assessment:    1. Calcification of native coronary artery   2. Essential hypertension   3. Medication management   4. Hyperlipidemia LDL goal <70   5. Obstructive sleep apnea syndrome   6. Gastroesophageal reflux disease without esophagitis   7. Class 3 severe obesity due to excess calories with serious comorbidity and body mass index (BMI) of 40.0 to 44.9 in adult [facility])      Plan:   In order of problems listed above:  1. Coronary Artery Calcification - she was recently admitted for evaluation of chest pain with cardiac enzymes remaining negative and EKG showed no acute ischemic changes. Echo showed a preserved EF of 55-60% with no regional WMA. A Coronary CTA was obtained and initially showed a 70% LAD lesion but with FFR, there was no  significant stenosis and no further ischemic evaluation was pursued. Reconstructed images showed only a mild mixed plaque with associated 25-50% .  - she denies any repeat episodes of chest discomfort or dyspnea on exertion since her recent hospitalization.  - continue risk factor modification with ASA and statin therapy. Lifestyle modifications discussed. Will stop BB therapy in the setting of  episodes of bradycardia and fatigue.   2. HTN - BP is well-controlled at 115/76 during today's visit.  - she has developed significant fatigue with BB therapy and has been unable to tolerate Lisinopril secondary to a dry cough. Will therefore stop Lopressor and Lisinopril. Start Losartan 25mg  daily along with continuing on HCTZ 25mg  daily. Plan for repeat BMET in 2 weeks. She will continue to follow BP at home and report back if BP becomes elevated.   3. HLD - Lipid Panel in 09/2016 showed total cholesterol of 170, HDL 63, and LDL 86. - continue Crestor 20mg  daily.   4. OSA - continue CPAP.  5. GERD - remains on Protonix 40mg  BID.   6. Obesity - BMI at 41.92. The patient reports she is considering undergoing gastric bypass.    Medication Adjustments/Labs and Tests Ordered: Current medicines are reviewed at length with the patient today.  Concerns regarding medicines are outlined above.  Medication changes, Labs and Tests ordered today are listed in the Patient Instructions below. Patient Instructions  Medication Instructions: STOP Lisinopril STOP Metoprolol START Losartan 25 mg tablet daily.  If you need a refill on your cardiac medications before your next appointment, please call your pharmacy.   Labwork: Your physician recommends that you return for lab work in: 2 weeks for a BMET, either at your PCP or the [employer] office.   Follow-Up: Your physician wants you to follow-up in: 6 months with Dr. Oval Linsey. You will receive a reminder letter in the mail two months in advance. If you  don't receive a letter, please call our office at [phone] to schedule this follow-up appointment.   Thank you for choosing Heartcare at [facility]!!      Signed, Danielle Heritage, PA-C  10/20/2016 12:30 PM    [address], [place], [place]  [address] Phone: [phone]; Fax: [phone]  [address] [place], [place] [address] Phone: [phone]

## 2016-10-20 ENCOUNTER — Ambulatory Visit (INDEPENDENT_AMBULATORY_CARE_PROVIDER_SITE_OTHER): Payer: Medicare Other | Admitting: Student

## 2016-10-20 ENCOUNTER — Encounter: Payer: Self-pay | Admitting: Student

## 2016-10-20 VITALS — BP 115/76 | HR 70 | Ht 62.0 in | Wt 229.2 lb

## 2016-10-20 DIAGNOSIS — K219 Gastro-esophageal reflux disease without esophagitis: Secondary | ICD-10-CM

## 2016-10-20 DIAGNOSIS — Z6841 Body Mass Index (BMI) 40.0 and over, adult: Secondary | ICD-10-CM

## 2016-10-20 DIAGNOSIS — I2584 Coronary atherosclerosis due to calcified coronary lesion: Secondary | ICD-10-CM | POA: Diagnosis not present

## 2016-10-20 DIAGNOSIS — I2510 Atherosclerotic heart disease of native coronary artery without angina pectoris: Secondary | ICD-10-CM | POA: Diagnosis not present

## 2016-10-20 DIAGNOSIS — E785 Hyperlipidemia, unspecified: Secondary | ICD-10-CM | POA: Diagnosis not present

## 2016-10-20 DIAGNOSIS — I10 Essential (primary) hypertension: Secondary | ICD-10-CM | POA: Diagnosis not present

## 2016-10-20 DIAGNOSIS — Z79899 Other long term (current) drug therapy: Secondary | ICD-10-CM | POA: Diagnosis not present

## 2016-10-20 DIAGNOSIS — G4733 Obstructive sleep apnea (adult) (pediatric): Secondary | ICD-10-CM | POA: Diagnosis not present

## 2016-10-20 MED ORDER — ASPIRIN EC 81 MG PO TBEC: 81.0000 mg | DELAYED_RELEASE_TABLET | Freq: Every day | ORAL | Status: DC

## 2016-10-20 MED ORDER — LOSARTAN POTASSIUM 25 MG PO TABS: 25.0000 mg | ORAL_TABLET | Freq: Every day | ORAL | 0 refills | Status: DC

## 2016-10-20 NOTE — Patient Instructions (Signed)
Medication Instructions: STOP Lisinopril STOP Metoprolol START Losartan 25 mg tablet daily.  If you need a refill on your cardiac medications before your next appointment, please call your pharmacy.   Labwork: Your physician recommends that you return for lab work in: 2 weeks for a BMET, either at your PCP or the [employer] office.   Follow-Up: Your physician wants you to follow-up in: 6 months with Dr. Oval Linsey. You will receive a reminder letter in the mail two months in advance. If you don't receive a letter, please call our office at [phone] to schedule this follow-up appointment.   Thank you for choosing [facility]care at [facility]!!

## 2016-11-09 DIAGNOSIS — K210 Gastro-esophageal reflux disease with esophagitis: Secondary | ICD-10-CM | POA: Diagnosis not present

## 2016-11-09 DIAGNOSIS — I10 Essential (primary) hypertension: Secondary | ICD-10-CM | POA: Diagnosis not present

## 2016-11-09 DIAGNOSIS — F329 Major depressive disorder, single episode, unspecified: Secondary | ICD-10-CM | POA: Diagnosis not present

## 2016-11-11 DIAGNOSIS — K219 Gastro-esophageal reflux disease without esophagitis: Secondary | ICD-10-CM | POA: Diagnosis not present

## 2016-11-11 DIAGNOSIS — R1013 Epigastric pain: Secondary | ICD-10-CM | POA: Diagnosis not present

## 2016-11-27 DIAGNOSIS — K2930 Chronic superficial gastritis without bleeding: Secondary | ICD-10-CM | POA: Diagnosis not present

## 2016-11-27 DIAGNOSIS — R1013 Epigastric pain: Secondary | ICD-10-CM | POA: Diagnosis not present

## 2016-12-01 DIAGNOSIS — K2930 Chronic superficial gastritis without bleeding: Secondary | ICD-10-CM | POA: Diagnosis not present

## 2017-02-23 DIAGNOSIS — R112 Nausea with vomiting, unspecified: Secondary | ICD-10-CM | POA: Diagnosis not present

## 2017-03-05 DIAGNOSIS — H01009 Unspecified blepharitis unspecified eye, unspecified eyelid: Secondary | ICD-10-CM | POA: Diagnosis not present

## 2017-03-05 DIAGNOSIS — H25013 Cortical age-related cataract, bilateral: Secondary | ICD-10-CM | POA: Diagnosis not present

## 2017-03-05 DIAGNOSIS — H40023 Open angle with borderline findings, high risk, bilateral: Secondary | ICD-10-CM | POA: Diagnosis not present

## 2017-03-05 DIAGNOSIS — H2513 Age-related nuclear cataract, bilateral: Secondary | ICD-10-CM | POA: Diagnosis not present

## 2017-03-12 DIAGNOSIS — H2513 Age-related nuclear cataract, bilateral: Secondary | ICD-10-CM | POA: Diagnosis not present

## 2017-03-12 DIAGNOSIS — H401131 Primary open-angle glaucoma, bilateral, mild stage: Secondary | ICD-10-CM | POA: Diagnosis not present

## 2017-03-12 DIAGNOSIS — H25013 Cortical age-related cataract, bilateral: Secondary | ICD-10-CM | POA: Diagnosis not present

## 2017-03-15 DIAGNOSIS — H2513 Age-related nuclear cataract, bilateral: Secondary | ICD-10-CM | POA: Diagnosis not present

## 2017-03-15 DIAGNOSIS — H2512 Age-related nuclear cataract, left eye: Secondary | ICD-10-CM | POA: Diagnosis not present

## 2017-03-15 DIAGNOSIS — H25013 Cortical age-related cataract, bilateral: Secondary | ICD-10-CM | POA: Diagnosis not present

## 2017-04-28 DIAGNOSIS — H401121 Primary open-angle glaucoma, left eye, mild stage: Secondary | ICD-10-CM | POA: Diagnosis not present

## 2017-04-28 DIAGNOSIS — H2512 Age-related nuclear cataract, left eye: Secondary | ICD-10-CM | POA: Diagnosis not present

## 2017-04-28 DIAGNOSIS — H25812 Combined forms of age-related cataract, left eye: Secondary | ICD-10-CM | POA: Diagnosis not present

## 2017-05-03 DIAGNOSIS — H2511 Age-related nuclear cataract, right eye: Secondary | ICD-10-CM | POA: Diagnosis not present

## 2017-05-03 DIAGNOSIS — H25011 Cortical age-related cataract, right eye: Secondary | ICD-10-CM | POA: Diagnosis not present

## 2017-05-12 DIAGNOSIS — H401111 Primary open-angle glaucoma, right eye, mild stage: Secondary | ICD-10-CM | POA: Diagnosis not present

## 2017-05-12 DIAGNOSIS — H25811 Combined forms of age-related cataract, right eye: Secondary | ICD-10-CM | POA: Diagnosis not present

## 2017-05-12 DIAGNOSIS — H2511 Age-related nuclear cataract, right eye: Secondary | ICD-10-CM | POA: Diagnosis not present

## 2017-06-25 DIAGNOSIS — G4733 Obstructive sleep apnea (adult) (pediatric): Secondary | ICD-10-CM | POA: Diagnosis not present

## 2017-06-25 DIAGNOSIS — E039 Hypothyroidism, unspecified: Secondary | ICD-10-CM | POA: Diagnosis not present

## 2017-06-25 DIAGNOSIS — F329 Major depressive disorder, single episode, unspecified: Secondary | ICD-10-CM | POA: Diagnosis not present

## 2017-06-25 DIAGNOSIS — Z0000 Encounter for general adult medical examination without abnormal findings: Secondary | ICD-10-CM | POA: Diagnosis not present

## 2017-06-25 DIAGNOSIS — F332 Major depressive disorder, recurrent severe without psychotic features: Secondary | ICD-10-CM | POA: Diagnosis not present

## 2017-06-25 DIAGNOSIS — Z7189 Other specified counseling: Secondary | ICD-10-CM | POA: Diagnosis not present

## 2017-06-25 DIAGNOSIS — Z1389 Encounter for screening for other disorder: Secondary | ICD-10-CM | POA: Diagnosis not present

## 2017-06-25 DIAGNOSIS — I5189 Other ill-defined heart diseases: Secondary | ICD-10-CM | POA: Diagnosis not present

## 2017-06-25 DIAGNOSIS — I10 Essential (primary) hypertension: Secondary | ICD-10-CM | POA: Diagnosis not present

## 2017-06-25 DIAGNOSIS — E785 Hyperlipidemia, unspecified: Secondary | ICD-10-CM | POA: Diagnosis not present

## 2017-06-25 DIAGNOSIS — K210 Gastro-esophageal reflux disease with esophagitis: Secondary | ICD-10-CM | POA: Diagnosis not present

## 2017-08-18 ENCOUNTER — Ambulatory Visit (INDEPENDENT_AMBULATORY_CARE_PROVIDER_SITE_OTHER): Payer: Medicare Other | Admitting: Cardiovascular Disease

## 2017-08-18 ENCOUNTER — Encounter: Payer: Self-pay | Admitting: Cardiovascular Disease

## 2017-08-18 VITALS — BP 114/78 | HR 81 | Ht 62.0 in | Wt 230.0 lb

## 2017-08-18 DIAGNOSIS — E785 Hyperlipidemia, unspecified: Secondary | ICD-10-CM | POA: Diagnosis not present

## 2017-08-18 DIAGNOSIS — I10 Essential (primary) hypertension: Secondary | ICD-10-CM | POA: Diagnosis not present

## 2017-08-18 DIAGNOSIS — I2510 Atherosclerotic heart disease of native coronary artery without angina pectoris: Secondary | ICD-10-CM

## 2017-08-18 DIAGNOSIS — Z5181 Encounter for therapeutic drug level monitoring: Secondary | ICD-10-CM

## 2017-08-18 MED ORDER — ROSUVASTATIN CALCIUM 40 MG PO TABS: 40.0000 mg | ORAL_TABLET | Freq: Every day | ORAL | 3 refills | Status: DC

## 2017-08-18 NOTE — Progress Notes (Signed)
Cardiology Office Note   Date:  08/18/2017   ID:  Donne, Robillard 31-Jan-1945, MRN [id number]  PCP:  [name], MD  Cardiologist:   Skeet Latch, MD   Chief Complaint  Patient presents with  . Follow-up    6 months  . Edema    Feet.     History of Present Illness: Danielle [name] is a 72 y.o. female with non-obstructive CAD, hypothyroidism, OSA, GERD and morbid obesity who presents for follow-up.  She was initially seen in the hospital 09/2016 with atypical chest pain.  She had a CT-a of the coronaries that initially showed a 70% LAD lesion.  However upon further review using FFR it was negative for obstructive disease.  Echo at that time revealed LVEF 55 to 60%.  She was continued on aspirin and rosuvastatin was increased.  Since that time she has felt well physically.  She has been under a tremendous amount of stress lately. Her son passed away 2 weeks ago after being ill for 6 months.  He had MRSA endocarditis.  There were plans for surgery but he never got well enough to be able to undergo surgery.  She cared for him at home for period of time.  She had to perform all his ADLs and was able to lift him without any chest pain or shortness of breath.  She does not get any formal exercise.  She has occasional swelling in her feet, worse on the left than the right.  It improves with elevation of her legs.  She denies orthopnea or PND.  She is able to walk more than 1 flight of stairs or 4 blocks.  Her PCP recently increased her blood pressure has been better-controlled since that time.  Past Medical History:  Diagnosis Date  . Arthritis 02-13-11   osteoarthritis, hips, knees,s/p Cervical fusion(DDD)  . Atherosclerosis of aorta ([facility]) 05/30/2015   on CTA chest  . CAD (coronary artery disease)    a. 09/2016: Coronary CT showing mid-LAD plaque with 20-50% associated stenosis but no significant stenosis by FFR analysis.   . Depression with anxiety   . Difficult  intubation 02-13-11   with surgery -multiple times  . GERD (gastroesophageal reflux disease) 02-13-11   tx. Omeprazole  . Glaucoma 02-13-11   bil. tx. eye drops daily  . Headache(784.0) 02-13-11   past hx. migraines, none recent  . Heart murmur 02-13-11   was told-no issues with this  . Hypercholesterolemia   . Hypertension 02/13/2011  . Hypothyroidism 02-13-11   tx. with Levothyroxine  . Mitral regurgitation 06/01/2015   Mild  . Sinus drainage 02-13-11   uses Zyrtec as needed  . Sleep apnea 02-13-11   Hx. sleep apnea-uses cpap since 10'12    Past Surgical History:  Procedure Laterality Date  . ABDOMINAL HYSTERECTOMY  02-13-11  . APPENDECTOMY  02-13-11   Lap. removal '92  . CERVICAL FUSION  02-13-11   '92- retained hardware  . CHOLECYSTECTOMY  02-13-11   '98-lap. galbladder removal due to stones  . GUM SURGERY  02-13-11   "small mouth"  . JOINT REPLACEMENT  02-13-11   RTHA-a yr ago in [place]  . TONSILLECTOMY  02-13-11   child  . TOTAL HIP ARTHROPLASTY  02/17/2011   Procedure: TOTAL HIP ARTHROPLASTY ANTERIOR APPROACH;  Surgeon: Mauri Pole, MD;  Location: [facility];  Service: Orthopedics;  Laterality: Left;  . TUBAL LIGATION  02-13-11     Current Outpatient Medications  Medication Sig  Dispense Refill  . ALPRAZolam (XANAX) 0.25 MG tablet Take 0.25 mg  PO BID PRN ANXIETY  0  . aspirin EC 81 MG tablet Take 1 tablet (81 mg total) by mouth daily.    . Biotin 5000 MCG CAPS Take 1 capsule (5,000 mcg total) by mouth 1 day or 1 dose. 30 capsule 0  . CALCIUM-VITAMIN D PO Take 1 capsule by mouth daily. 1200-1000 calcium-vitamin d    . cetirizine (ZYRTEC) 10 MG tablet Take 10 mg by mouth daily as needed for allergies.     . DULoxetine (CYMBALTA) 30 MG capsule Take 30 mg by mouth daily.     . hydrochlorothiazide (HYDRODIURIL) 25 MG tablet Take 25 mg by mouth daily.    Marland Kitchen levothyroxine (SYNTHROID, LEVOTHROID) 137 MCG tablet Take 137 mcg by mouth daily before breakfast.    . losartan (COZAAR) 50 MG tablet Take  50 mg by mouth daily.    . Magnesium 100 MG CAPS Take 100 mg by mouth daily.    . pantoprazole (PROTONIX) 40 MG tablet Take 1 tablet (40 mg total) by mouth 2 (two) times daily. (Patient taking differently: Take 40 mg by mouth daily. ) 60 tablet 0  . rosuvastatin (CRESTOR) 40 MG tablet Take 1 tablet (40 mg total) by mouth daily. 90 tablet 3  . vitamin B-12 (CYANOCOBALAMIN) 500 MCG tablet Take 500 mcg by mouth daily. Reported on 05/16/2015     No current facility-administered medications for this visit.     Allergies:   Ceftin [cefuroxime axetil]; Codeine; and Compazine [prochlorperazine]    Social History:  The patient  reports that she has never smoked. She has never used smokeless tobacco. She reports that she drinks alcohol. She reports that she does not use drugs.   Family History:  The patient's family history includes Breast cancer in her maternal aunt; Cancer in her mother.    ROS:  Please see the history of present illness.   Otherwise, review of systems are positive for none.   All other systems are reviewed and negative.    PHYSICAL EXAM: VS:  BP 114/78 (BP Location: Left Arm, Patient Position: Sitting, Cuff Size: Large)   Pulse 81   Ht 5\' 2"  (1.575 m)   Wt 230 lb (104.3 kg)   BMI 42.07 kg/m  , BMI Body mass index is 42.07 kg/m. GENERAL:  Well appearing HEENT:  Pupils equal round and reactive, fundi not visualized, oral mucosa unremarkable NECK:  No jugular venous distention, waveform within normal limits, carotid upstroke brisk and symmetric, no bruits LUNGS:  Clear to auscultation bilaterally HEART:  RRR.  PMI not displaced or sustained,S1 and S2 within normal limits, no S3, no S4, no clicks, no rubs, no murmurs ABD:  Flat, positive bowel sounds normal in frequency in pitch, no bruits, no rebound, no guarding, no midline pulsatile mass, no hepatomegaly, no splenomegaly EXT:  2 plus pulses throughout, no edema, no cyanosis no clubbing SKIN:  No rashes no nodules NEURO:   Cranial nerves II through XII grossly intact, motor grossly intact throughout PSYCH:  Cognitively intact, oriented to person place and time    EKG:  EKG is ordered today. The ekg ordered today demonstrates sinus rhyhtm.  Rate 81 bpm.   Echo 10/09/2016: Study Conclusions  - Left ventricle: The cavity size was normal. Systolic function was   normal. The estimated ejection fraction was in the range of 55%   to 60%. Wall motion was normal; there were no regional wall  motion abnormalities. Left ventricular diastolic function   parameters were normal.  Cardiac CT-A 10/08/16: 1. Left Main:  No significant stenosis.  2. LAD: No significant stenosis.  25-50% mild, mixed plaque in the mid LAD. 3. LCX: No significant stenosis. 4. RCA: No significant stenosis.  Recent Labs: 10/07/2016: TSH 0.446 10/08/2016: ALT 17; BUN 14; Creatinine, Ser 0.88; Hemoglobin 13.4; Platelets 256; Potassium 3.5; Sodium 140    Lipid Panel    Component Value Date/Time   CHOL 170 10/08/2016 0413   TRIG 105 10/08/2016 0413   HDL 63 10/08/2016 0413   CHOLHDL 2.7 10/08/2016 0413   VLDL 21 10/08/2016 0413   LDLCALC 86 10/08/2016 0413      Wt Readings from Last 3 Encounters:  08/18/17 230 lb (104.3 kg)  10/20/16 229 lb 3.2 oz (104 kg)  10/09/16 227 lb (103 kg)      ASSESSMENT AND PLAN:  # Non-obstructive CAD: # Hyperlipidemia: Cardiac CT-A was notable for mild LAD disease with an FFR >0.8.  LDL was 100 06/2017.  We will increase rosuvastatin to 40mg .  Return for lipids and CMP in 3 months.  # Morbid obesity:  Ms. Jearld Adjutant was encouraged to increase her exercise to 150 minutes/week.  # Depression: Recommended grief counseling to process the death of her son.  # Hypertension: Well-controled on HCTZ and losartan.   Current medicines are reviewed at length with the patient today.  The patient does not have concerns regarding medicines.  The following changes have been made:  Increase  rosuvastatin  Labs/ tests ordered today include:   Orders Placed This Encounter  Procedures  . Lipid panel  . Comprehensive metabolic panel  . EKG 12-Lead     Disposition:   FU with Antero Derosia C. Oval Linsey, MD, [facility] in 1 year.     Signed, Haidar Muse C. Oval Linsey, MD, [facility]  08/18/2017 6:05 PM    [place]

## 2017-08-18 NOTE — Patient Instructions (Signed)
Medication Instructions:  INCREASE YOUR ROSUVASTATIN TO 40 MG DAILY   Labwork: FASTING LP/CMET IN 3 MONTHS   Testing/Procedures: NONE  Follow-Up: Your physician wants you to follow-up in: [place] will receive a reminder letter in the mail two months in advance. If you don't receive a letter, please call our office to schedule the follow-up appointment.  If you need a refill on your cardiac medications before your next appointment, please call your pharmacy.

## 2017-09-01 DIAGNOSIS — G4733 Obstructive sleep apnea (adult) (pediatric): Secondary | ICD-10-CM | POA: Diagnosis not present

## 2017-10-18 DIAGNOSIS — H401131 Primary open-angle glaucoma, bilateral, mild stage: Secondary | ICD-10-CM | POA: Diagnosis not present

## 2017-12-29 ENCOUNTER — Other Ambulatory Visit: Payer: Self-pay | Admitting: Internal Medicine

## 2017-12-29 ENCOUNTER — Ambulatory Visit
Admission: RE | Admit: 2017-12-29 | Discharge: 2017-12-29 | Disposition: A | Discharge status: Home / Self Care | Payer: Medicare Other | Source: Ambulatory Visit | Attending: Internal Medicine | Admitting: Internal Medicine

## 2017-12-29 DIAGNOSIS — R0781 Pleurodynia: Secondary | ICD-10-CM

## 2017-12-29 DIAGNOSIS — F3341 Major depressive disorder, recurrent, in partial remission: Secondary | ICD-10-CM | POA: Diagnosis not present

## 2017-12-29 DIAGNOSIS — R7303 Prediabetes: Secondary | ICD-10-CM | POA: Diagnosis not present

## 2017-12-29 DIAGNOSIS — M546 Pain in thoracic spine: Secondary | ICD-10-CM | POA: Diagnosis not present

## 2017-12-29 DIAGNOSIS — I10 Essential (primary) hypertension: Secondary | ICD-10-CM | POA: Diagnosis not present

## 2017-12-29 DIAGNOSIS — S299XXA Unspecified injury of thorax, initial encounter: Secondary | ICD-10-CM | POA: Diagnosis not present

## 2017-12-29 DIAGNOSIS — M2550 Pain in unspecified joint: Secondary | ICD-10-CM | POA: Diagnosis not present

## 2017-12-29 DIAGNOSIS — F419 Anxiety disorder, unspecified: Secondary | ICD-10-CM | POA: Diagnosis not present

## 2017-12-29 DIAGNOSIS — R739 Hyperglycemia, unspecified: Secondary | ICD-10-CM | POA: Diagnosis not present

## 2017-12-29 DIAGNOSIS — E785 Hyperlipidemia, unspecified: Secondary | ICD-10-CM | POA: Diagnosis not present

## 2017-12-29 DIAGNOSIS — W19XXXD Unspecified fall, subsequent encounter: Secondary | ICD-10-CM | POA: Diagnosis not present

## 2017-12-29 DIAGNOSIS — Z23 Encounter for immunization: Secondary | ICD-10-CM | POA: Diagnosis not present

## 2017-12-31 ENCOUNTER — Other Ambulatory Visit: Payer: Self-pay

## 2017-12-31 ENCOUNTER — Emergency Department (HOSPITAL_COMMUNITY): Payer: Medicare Other

## 2017-12-31 ENCOUNTER — Emergency Department (HOSPITAL_COMMUNITY)
Admission: EM | Admit: 2017-12-31 | Discharge: 2018-01-01 | Disposition: A | Discharge status: Home / Self Care | Payer: Medicare Other | Attending: Emergency Medicine | Admitting: Emergency Medicine

## 2017-12-31 ENCOUNTER — Encounter (HOSPITAL_COMMUNITY): Payer: Self-pay | Admitting: Emergency Medicine

## 2017-12-31 DIAGNOSIS — Y92009 Unspecified place in unspecified non-institutional (private) residence as the place of occurrence of the external cause: Secondary | ICD-10-CM | POA: Diagnosis not present

## 2017-12-31 DIAGNOSIS — S92255A Nondisplaced fracture of navicular [scaphoid] of left foot, initial encounter for closed fracture: Secondary | ICD-10-CM | POA: Insufficient documentation

## 2017-12-31 DIAGNOSIS — Z79899 Other long term (current) drug therapy: Secondary | ICD-10-CM | POA: Insufficient documentation

## 2017-12-31 DIAGNOSIS — Y999 Unspecified external cause status: Secondary | ICD-10-CM | POA: Diagnosis not present

## 2017-12-31 DIAGNOSIS — E039 Hypothyroidism, unspecified: Secondary | ICD-10-CM | POA: Diagnosis not present

## 2017-12-31 DIAGNOSIS — Z7982 Long term (current) use of aspirin: Secondary | ICD-10-CM | POA: Insufficient documentation

## 2017-12-31 DIAGNOSIS — S8991XA Unspecified injury of right lower leg, initial encounter: Secondary | ICD-10-CM | POA: Diagnosis not present

## 2017-12-31 DIAGNOSIS — I2510 Atherosclerotic heart disease of native coronary artery without angina pectoris: Secondary | ICD-10-CM | POA: Insufficient documentation

## 2017-12-31 DIAGNOSIS — X509XXA Other and unspecified overexertion or strenuous movements or postures, initial encounter: Secondary | ICD-10-CM | POA: Insufficient documentation

## 2017-12-31 DIAGNOSIS — S92355A Nondisplaced fracture of fifth metatarsal bone, left foot, initial encounter for closed fracture: Secondary | ICD-10-CM | POA: Insufficient documentation

## 2017-12-31 DIAGNOSIS — M25561 Pain in right knee: Secondary | ICD-10-CM | POA: Diagnosis not present

## 2017-12-31 DIAGNOSIS — M25562 Pain in left knee: Secondary | ICD-10-CM | POA: Insufficient documentation

## 2017-12-31 DIAGNOSIS — S8001XA Contusion of right knee, initial encounter: Secondary | ICD-10-CM | POA: Diagnosis not present

## 2017-12-31 DIAGNOSIS — S60221A Contusion of right hand, initial encounter: Secondary | ICD-10-CM

## 2017-12-31 DIAGNOSIS — I10 Essential (primary) hypertension: Secondary | ICD-10-CM | POA: Insufficient documentation

## 2017-12-31 DIAGNOSIS — M79641 Pain in right hand: Secondary | ICD-10-CM | POA: Diagnosis not present

## 2017-12-31 DIAGNOSIS — S6991XA Unspecified injury of right wrist, hand and finger(s), initial encounter: Secondary | ICD-10-CM | POA: Diagnosis not present

## 2017-12-31 DIAGNOSIS — S99922A Unspecified injury of left foot, initial encounter: Secondary | ICD-10-CM | POA: Diagnosis present

## 2017-12-31 DIAGNOSIS — Z96642 Presence of left artificial hip joint: Secondary | ICD-10-CM | POA: Insufficient documentation

## 2017-12-31 DIAGNOSIS — Y9301 Activity, walking, marching and hiking: Secondary | ICD-10-CM | POA: Diagnosis not present

## 2017-12-31 MED ORDER — OXYCODONE-ACETAMINOPHEN 5-325 MG PO TABS
1.0000 | ORAL_TABLET | Freq: Once | ORAL | Status: AC
  Administered 2017-12-31: 1 via ORAL
  Filled 2017-12-31: qty 1

## 2017-12-31 MED ORDER — OXYCODONE-ACETAMINOPHEN 5-325 MG PO TABS: 1.0000 | ORAL_TABLET | Freq: Four times a day (QID) | ORAL | 0 refills | Status: DC | PRN

## 2017-12-31 NOTE — ED Provider Notes (Signed)
[facility] EMERGENCY DEPARTMENT Provider Note   CSN: [id number] Arrival date & time: 12/31/17  2030     History   Chief Complaint Chief Complaint  Patient presents with  . Fall  . Ankle Pain  . Knee Pain  . Hand Pain    HPI Danielle [name] is a 72 y.o. female.  She had a mechanical fall today around 1 PM when she was walking in her son's house.  She twisted her left ankle and fell to the ground.  No loss of consciousness.  She is complaining of left ankle and foot pain right knee pain and right hand pain.  She has been ambulatory with a limp.  She rates the pain is severe and sharp and increased with movement and improved with nonweightbearing.  No numbness.  No headache no chest pain no neck pain no back pain.  The history is provided by the patient.  Fall  This is a new problem. The current episode started 6 to 12 hours ago. The problem has not changed since onset.Pertinent negatives include no chest pain, no abdominal pain, no headaches and no shortness of breath. The symptoms are aggravated by standing. The symptoms are relieved by position. She has tried nothing for the symptoms. The treatment provided no relief.  Ankle Pain   The incident occurred 6 to 12 hours ago. The incident occurred at home. The injury mechanism was a fall. The pain is present in the left foot, left ankle and right knee. The pain is severe. The pain has been constant since onset. Associated symptoms include inability to bear weight. Pertinent negatives include no loss of sensation. She reports no foreign bodies present. The symptoms are aggravated by activity, bearing weight and palpation. She has tried nothing for the symptoms. The treatment provided no relief.    Past Medical History:  Diagnosis Date  . Arthritis 02-13-11   osteoarthritis, hips, knees,s/p Cervical fusion(DDD)  . Atherosclerosis of aorta ([place]) 05/30/2015   on CTA chest  . CAD (coronary artery disease)    a. 09/2016:  Coronary CT showing mid-LAD plaque with 20-50% associated stenosis but no significant stenosis by FFR analysis.   . Depression with anxiety   . Difficult intubation 02-13-11   with surgery -multiple times  . GERD (gastroesophageal reflux disease) 02-13-11   tx. Omeprazole  . Glaucoma 02-13-11   bil. tx. eye drops daily  . Headache(784.0) 02-13-11   past hx. migraines, none recent  . Heart murmur 02-13-11   was told-no issues with this  . Hypercholesterolemia   . Hypertension 02/13/2011  . Hypothyroidism 02-13-11   tx. with Levothyroxine  . Mitral regurgitation 06/01/2015   Mild  . Sinus drainage 02-13-11   uses Zyrtec as needed  . Sleep apnea 02-13-11   Hx. sleep apnea-uses cpap since 10'12    Patient Active Problem List   Diagnosis Date Noted  . Calcification of native coronary artery 10/20/2016  . Morbid obesity ([place])   . Chest pain 10/07/2016  . Hyperglycemia 10/07/2016  . Acute respiratory failure with hypoxia ([place]) 05/30/2015  . Atypical pneumonia 05/30/2015  . Dyspnea 05/30/2015  . Hyperlipidemia LDL goal <70   . Gastroesophageal reflux disease without esophagitis   . Depression with anxiety   . Tibial plateau fracture 05/16/2015  . Closed tibial fracture 05/16/2015  . Sebaceous cyst of breast 07/27/2013  . 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  . Sleep apnea 02/13/2011  . Hypertension 02/13/2011  . GERD (gastroesophageal reflux disease)  02/13/2011  . Hypothyroidism 02/13/2011    Past Surgical History:  Procedure Laterality Date  . ABDOMINAL HYSTERECTOMY  02-13-11  . APPENDECTOMY  02-13-11   Lap. removal '92  . CERVICAL FUSION  02-13-11   '92- retained hardware  . CHOLECYSTECTOMY  02-13-11   '98-lap. galbladder removal due to stones  . GUM SURGERY  02-13-11   "small mouth"  . JOINT REPLACEMENT  02-13-11   RTHA-a yr ago in [place]  . TONSILLECTOMY  02-13-11   child  . TOTAL HIP ARTHROPLASTY  02/17/2011   Procedure: TOTAL HIP ARTHROPLASTY ANTERIOR APPROACH;  Surgeon: Mauri Pole,  MD;  Location: [facility];  Service: Orthopedics;  Laterality: Left;  . TUBAL LIGATION  02-13-11     OB History   No obstetric history on file.      Home Medications    Prior to Admission medications   Medication Sig Start Date End Date Taking? Authorizing Provider  ALPRAZolam Duanne Moron) 0.25 MG tablet Take 0.25 mg  PO BID PRN ANXIETY 05/24/15   [provider]  aspirin EC 81 MG tablet Take 1 tablet (81 mg total) by mouth daily. 10/20/16   Strader, Fransisco Hertz, PA-C  Biotin 5000 MCG CAPS Take 1 capsule (5,000 mcg total) by mouth 1 day or 1 dose. 10/08/16   Nita Sells, MD  CALCIUM-VITAMIN D PO Take 1 capsule by mouth daily. 1200-1000 calcium-vitamin d    [provider]  cetirizine (ZYRTEC) 10 MG tablet Take 10 mg by mouth daily as needed for allergies.     [provider]  DULoxetine (CYMBALTA) 30 MG capsule Take 30 mg by mouth daily.     [provider]  hydrochlorothiazide (HYDRODIURIL) 25 MG tablet Take 25 mg by mouth daily.    [provider]  levothyroxine (SYNTHROID, LEVOTHROID) 137 MCG tablet Take 137 mcg by mouth daily before breakfast.    [provider]  losartan (COZAAR) 50 MG tablet Take 50 mg by mouth daily.    [provider]  Magnesium 100 MG CAPS Take 100 mg by mouth daily.    [provider]  pantoprazole (PROTONIX) 40 MG tablet Take 1 tablet (40 mg total) by mouth 2 (two) times daily. Patient taking differently: Take 40 mg by mouth daily.  10/08/16   Nita Sells, MD  rosuvastatin (CRESTOR) 40 MG tablet Take 1 tablet (40 mg total) by mouth daily. 08/18/17   Skeet Latch, MD  vitamin B-12 (CYANOCOBALAMIN) 500 MCG tablet Take 500 mcg by mouth daily. Reported on 05/16/2015    [provider]    Family History Family History  Problem Relation Age of Onset  . Cancer Mother        lung  . Breast cancer Maternal Aunt     Social History Social History   Tobacco Use  . Smoking  status: Never Smoker  . Smokeless tobacco: Never Used  Substance Use Topics  . Alcohol use: Yes    Comment: rare  . Drug use: No     Allergies   Ceftin [cefuroxime axetil]; Codeine; and Compazine [prochlorperazine]   Review of Systems Review of Systems  Constitutional: Negative for fever.  HENT: Negative for sore throat.   Eyes: Negative for visual disturbance.  Respiratory: Negative for shortness of breath.   Cardiovascular: Negative for chest pain.  Gastrointestinal: Negative for abdominal pain.  Genitourinary: Negative for dysuria.  Musculoskeletal: Positive for gait problem.  Skin: Negative for rash and wound.  Neurological: Negative for headaches.  Physical Exam Updated Vital Signs BP 140/73 (BP Location: Right Arm)   Pulse 85   Temp 97.8 F (36.6 C) (Oral)   Resp 18   SpO2 100%   Physical Exam Vitals signs and nursing note reviewed.  Constitutional:      General: She is not in acute distress.    Appearance: She is well-developed.  HENT:     Head: Normocephalic and atraumatic.  Eyes:     Conjunctiva/sclera: Conjunctivae normal.  Neck:     Musculoskeletal: Normal range of motion and neck supple.  Cardiovascular:     Rate and Rhythm: Normal rate and regular rhythm.     Heart sounds: No murmur.  Pulmonary:     Effort: Pulmonary effort is normal. No respiratory distress.     Breath sounds: Normal breath sounds.  Abdominal:     Palpations: Abdomen is soft.     Tenderness: There is no abdominal tenderness.  Musculoskeletal:        General: Swelling, tenderness and signs of injury present.     Comments: She is got pain in her right hand in her second and fifth digits full range of motion no deformities.  Cap refill brisk.  Wrist elbow shoulder nontender.  Left upper extremity full range of motion without limitations.  She is a little diffuse pain over her left knee with no significant effusion.  Hip and nontender.  She is a little diffuse pain on her right  knee full range of motion.  Hip and ankle nontender.  Left ankle she has minimal bimalleolar pain but clear swelling and pain from palpation of the base of the fifth metatarsal.  She is able to move her digits although is painful and she causes her limitations.  Sensation and cap refill brisk.  Skin:    General: Skin is warm and dry.     Capillary Refill: Capillary refill takes less than 2 seconds.  Neurological:     General: No focal deficit present.     Mental Status: She is alert.      ED Treatments / Results  Labs (all labs ordered are listed, but only abnormal results are displayed) Labs Reviewed - No data to display  EKG None  Radiology Dg Ankle Complete Left  Result Date: 12/31/2017 CLINICAL DATA:  Fall EXAM: LEFT ANKLE COMPLETE - 3+ VIEW COMPARISON:  02/23/2013 FINDINGS: No acute displaced fracture or malalignment at the ankle. Ankle mortise symmetric. Suspected dorsal cortical avulsion off the navicular. Acute nondisplaced base of fifth metatarsal fracture IMPRESSION: 1. Nondisplaced base of fifth metatarsal fracture 2. Suspected cortical avulsion fracture injury off the dorsal navicular Electronically Signed   By: Donavan Foil M.D.   On: 12/31/2017 22:09   Dg Knee Complete 4 Views Right  Result Date: 12/31/2017 CLINICAL DATA:  Fall with knee pain EXAM: RIGHT KNEE - COMPLETE 4+ VIEW COMPARISON:  05/16/2015 FINDINGS: No acute displaced fracture or malalignment. Mild patellofemoral degenerative change. Old fracture deformity of the lateral tibial plateau. Mild medial and lateral degenerative changes IMPRESSION: No acute osseous abnormality. Old fracture deformity of the lateral tibial plateau Electronically Signed   By: Donavan Foil M.D.   On: 12/31/2017 22:08   Dg Hand Complete Right  Result Date: 12/31/2017 CLINICAL DATA:  Fall with right hand pain EXAM: RIGHT HAND - COMPLETE 3+ VIEW COMPARISON:  06/01/2011 FINDINGS: No fracture or malalignment. Mild arthritis at the first  [employer] joint and STT interval. Mild DIP arthritis. IMPRESSION: No acute osseous abnormality Electronically Signed  By: Donavan Foil M.D.   On: 12/31/2017 22:06   Dg Foot Complete Left  Result Date: 12/31/2017 CLINICAL DATA:  Fall with left foot pain EXAM: LEFT FOOT - COMPLETE 3+ VIEW COMPARISON:  02/23/2013 FINDINGS: Acute nondisplaced fracture base of fifth metatarsal with extension to the fifth TMT joint. No subluxation. Suspected dorsal cortical avulsion fracture off the navicular bone. Mild degenerative change at the first MTP joint. Small erosions at the head of the first metatarsal IMPRESSION: 1. Acute nondisplaced intra-articular fracture base of fifth metatarsal 2. Probable dorsal cortical avulsion fracture off the navicular bone. Electronically Signed   By: Donavan Foil M.D.   On: 12/31/2017 22:05    Procedures Procedures (including critical care time)  Medications Ordered in ED Medications  oxyCODONE-acetaminophen (PERCOCET/ROXICET) 5-325 MG per tablet 1 tablet (has no administration in time range)     Initial Impression / Assessment and Plan / ED Course  I have reviewed the triage vital signs and the nursing notes.  Pertinent labs & imaging results that were available during my care of the patient were reviewed by me and considered in my medical decision making (see chart for details).  Clinical Course as of Jan 01 2324  Fri Dec 31, 2017  2243 Discussed with Dr. Gladstone Lighter covering Dr. Ihor Gully.  He recommends the patient go into a cam boot, patient can continue her baby aspirin, and can follow-up with him on Monday at 830.   [MB]    Clinical Course User Index [MB] Hayden Rasmussen, MD     Final Clinical Impressions(s) / ED Diagnoses   Final diagnoses:  Closed nondisplaced fracture of fifth metatarsal bone of left foot, initial encounter  Contusion of right hand, initial encounter  Contusion of right knee, initial encounter  Closed nondisplaced fracture of navicular bone of  left foot, initial encounter    ED Discharge Orders         Ordered    oxyCODONE-acetaminophen (PERCOCET/ROXICET) 5-325 MG tablet  Every 6 hours PRN     12/31/17 2248           Hayden Rasmussen, MD 12/31/17 2325

## 2017-12-31 NOTE — ED Triage Notes (Signed)
Pt missed the step down into her son's living room and fell face forward around 1pm today.  States she twisted her L ankle.  C/o  L ankle pain, L foot pain, R hand pain, and R knee pain.

## 2017-12-31 NOTE — Discharge Instructions (Addendum)
You were evaluated in the emergency department for injuries from a fall earlier today.  Your x-rays of your right hand and knee did not show any obvious fractures.  Your ankle and foot x-ray showed 1/5 metatarsal fracture and a possible navicular avulsion fracture.  We are fitting you for a cam boot to help stabilize these areas and allow you to weight-bear as tolerated.  We are prescribing you some pain medicine to use as needed, please understand that these may make you more unsteady and to be careful.  You should follow-up with orthopedics on Monday.

## 2018-01-01 NOTE — ED Notes (Signed)
Pt discharge instructions reviewed with them. All questions answered. Pt wheeled to vehicle with belongings and assisted into vehicle

## 2018-01-03 DIAGNOSIS — M79672 Pain in left foot: Secondary | ICD-10-CM | POA: Diagnosis not present

## 2018-01-11 DIAGNOSIS — M79672 Pain in left foot: Secondary | ICD-10-CM | POA: Diagnosis not present

## 2018-01-20 ENCOUNTER — Other Ambulatory Visit: Payer: Self-pay | Admitting: Internal Medicine

## 2018-01-20 DIAGNOSIS — Z1231 Encounter for screening mammogram for malignant neoplasm of breast: Secondary | ICD-10-CM

## 2018-02-14 DIAGNOSIS — M25572 Pain in left ankle and joints of left foot: Secondary | ICD-10-CM | POA: Diagnosis not present

## 2018-02-14 DIAGNOSIS — S92355D Nondisplaced fracture of fifth metatarsal bone, left foot, subsequent encounter for fracture with routine healing: Secondary | ICD-10-CM | POA: Diagnosis not present

## 2018-02-15 DIAGNOSIS — Z961 Presence of intraocular lens: Secondary | ICD-10-CM | POA: Diagnosis not present

## 2018-02-15 DIAGNOSIS — H04123 Dry eye syndrome of bilateral lacrimal glands: Secondary | ICD-10-CM | POA: Diagnosis not present

## 2018-02-15 DIAGNOSIS — H26493 Other secondary cataract, bilateral: Secondary | ICD-10-CM | POA: Diagnosis not present

## 2018-02-15 DIAGNOSIS — H401131 Primary open-angle glaucoma, bilateral, mild stage: Secondary | ICD-10-CM | POA: Diagnosis not present

## 2018-03-08 ENCOUNTER — Ambulatory Visit: Payer: Medicare Other

## 2018-04-05 ENCOUNTER — Ambulatory Visit: Payer: Medicare Other

## 2018-05-17 ENCOUNTER — Other Ambulatory Visit: Payer: Self-pay | Admitting: *Deleted

## 2018-06-28 DIAGNOSIS — E785 Hyperlipidemia, unspecified: Secondary | ICD-10-CM | POA: Diagnosis not present

## 2018-06-28 DIAGNOSIS — Z1389 Encounter for screening for other disorder: Secondary | ICD-10-CM | POA: Diagnosis not present

## 2018-06-28 DIAGNOSIS — E1169 Type 2 diabetes mellitus with other specified complication: Secondary | ICD-10-CM | POA: Diagnosis not present

## 2018-06-28 DIAGNOSIS — R110 Nausea: Secondary | ICD-10-CM | POA: Diagnosis not present

## 2018-06-28 DIAGNOSIS — F3341 Major depressive disorder, recurrent, in partial remission: Secondary | ICD-10-CM | POA: Diagnosis not present

## 2018-06-28 DIAGNOSIS — I10 Essential (primary) hypertension: Secondary | ICD-10-CM | POA: Diagnosis not present

## 2018-06-28 DIAGNOSIS — Z0000 Encounter for general adult medical examination without abnormal findings: Secondary | ICD-10-CM | POA: Diagnosis not present

## 2018-09-05 DIAGNOSIS — G4733 Obstructive sleep apnea (adult) (pediatric): Secondary | ICD-10-CM | POA: Diagnosis not present

## 2018-09-05 DIAGNOSIS — G4752 REM sleep behavior disorder: Secondary | ICD-10-CM | POA: Diagnosis not present

## 2018-09-06 DIAGNOSIS — H401131 Primary open-angle glaucoma, bilateral, mild stage: Secondary | ICD-10-CM | POA: Diagnosis not present

## 2018-09-06 DIAGNOSIS — H524 Presbyopia: Secondary | ICD-10-CM | POA: Diagnosis not present

## 2018-09-06 DIAGNOSIS — H26493 Other secondary cataract, bilateral: Secondary | ICD-10-CM | POA: Diagnosis not present

## 2018-10-10 ENCOUNTER — Other Ambulatory Visit: Payer: Self-pay | Admitting: Internal Medicine

## 2018-10-10 DIAGNOSIS — R634 Abnormal weight loss: Secondary | ICD-10-CM | POA: Diagnosis not present

## 2018-10-10 DIAGNOSIS — Z23 Encounter for immunization: Secondary | ICD-10-CM | POA: Diagnosis not present

## 2018-10-10 DIAGNOSIS — E1169 Type 2 diabetes mellitus with other specified complication: Secondary | ICD-10-CM | POA: Diagnosis not present

## 2018-10-10 DIAGNOSIS — M8588 Other specified disorders of bone density and structure, other site: Secondary | ICD-10-CM | POA: Diagnosis not present

## 2018-10-10 DIAGNOSIS — E039 Hypothyroidism, unspecified: Secondary | ICD-10-CM | POA: Diagnosis not present

## 2018-10-17 ENCOUNTER — Ambulatory Visit: Payer: Medicare Other

## 2018-10-18 ENCOUNTER — Other Ambulatory Visit: Payer: Self-pay

## 2018-10-18 ENCOUNTER — Ambulatory Visit
Admission: RE | Admit: 2018-10-18 | Discharge: 2018-10-18 | Disposition: A | Discharge status: Home / Self Care | Payer: Medicare Other | Source: Ambulatory Visit | Attending: Internal Medicine | Admitting: Internal Medicine

## 2018-10-18 DIAGNOSIS — Z1231 Encounter for screening mammogram for malignant neoplasm of breast: Secondary | ICD-10-CM

## 2018-10-19 ENCOUNTER — Other Ambulatory Visit: Payer: Self-pay | Admitting: Internal Medicine

## 2018-10-19 DIAGNOSIS — R928 Other abnormal and inconclusive findings on diagnostic imaging of breast: Secondary | ICD-10-CM

## 2018-10-21 ENCOUNTER — Other Ambulatory Visit: Payer: Self-pay | Admitting: Internal Medicine

## 2018-10-21 ENCOUNTER — Ambulatory Visit
Admission: RE | Admit: 2018-10-21 | Discharge: 2018-10-21 | Disposition: A | Discharge status: Home / Self Care | Payer: Medicare Other | Source: Ambulatory Visit | Attending: Internal Medicine | Admitting: Internal Medicine

## 2018-10-21 ENCOUNTER — Other Ambulatory Visit: Payer: Self-pay

## 2018-10-21 DIAGNOSIS — R928 Other abnormal and inconclusive findings on diagnostic imaging of breast: Secondary | ICD-10-CM

## 2018-10-21 DIAGNOSIS — N6314 Unspecified lump in the right breast, lower inner quadrant: Secondary | ICD-10-CM | POA: Diagnosis not present

## 2018-10-21 DIAGNOSIS — N63 Unspecified lump in unspecified breast: Secondary | ICD-10-CM

## 2018-11-02 DIAGNOSIS — G4733 Obstructive sleep apnea (adult) (pediatric): Secondary | ICD-10-CM | POA: Diagnosis not present

## 2018-11-02 DIAGNOSIS — G4754 Parasomnia in conditions classified elsewhere: Secondary | ICD-10-CM | POA: Diagnosis not present

## 2018-11-08 ENCOUNTER — Other Ambulatory Visit: Payer: Self-pay

## 2018-11-08 ENCOUNTER — Ambulatory Visit
Admission: RE | Admit: 2018-11-08 | Discharge: 2018-11-08 | Disposition: A | Discharge status: Home / Self Care | Payer: Medicare Other | Source: Ambulatory Visit | Attending: Internal Medicine | Admitting: Internal Medicine

## 2018-11-08 DIAGNOSIS — M85831 Other specified disorders of bone density and structure, right forearm: Secondary | ICD-10-CM | POA: Diagnosis not present

## 2018-11-08 DIAGNOSIS — M8588 Other specified disorders of bone density and structure, other site: Secondary | ICD-10-CM

## 2018-11-08 DIAGNOSIS — Z780 Asymptomatic menopausal state: Secondary | ICD-10-CM | POA: Diagnosis not present

## 2019-01-26 DIAGNOSIS — G4733 Obstructive sleep apnea (adult) (pediatric): Secondary | ICD-10-CM | POA: Diagnosis not present

## 2019-04-25 ENCOUNTER — Other Ambulatory Visit: Payer: Self-pay

## 2019-04-25 ENCOUNTER — Ambulatory Visit
Admission: RE | Admit: 2019-04-25 | Discharge: 2019-04-25 | Disposition: A | Discharge status: Home / Self Care | Payer: Medicare HMO | Source: Ambulatory Visit | Attending: Internal Medicine | Admitting: Internal Medicine

## 2019-04-25 ENCOUNTER — Ambulatory Visit
Admission: RE | Admit: 2019-04-25 | Discharge: 2019-04-25 | Disposition: A | Discharge status: Home / Self Care | Payer: Medicare Other | Source: Ambulatory Visit | Attending: Internal Medicine | Admitting: Internal Medicine

## 2019-04-25 ENCOUNTER — Other Ambulatory Visit: Payer: Self-pay | Admitting: Internal Medicine

## 2019-04-25 DIAGNOSIS — N63 Unspecified lump in unspecified breast: Secondary | ICD-10-CM

## 2019-04-25 DIAGNOSIS — R928 Other abnormal and inconclusive findings on diagnostic imaging of breast: Secondary | ICD-10-CM | POA: Diagnosis not present

## 2019-04-25 DIAGNOSIS — N6314 Unspecified lump in the right breast, lower inner quadrant: Secondary | ICD-10-CM | POA: Diagnosis not present

## 2019-05-03 DIAGNOSIS — G4754 Parasomnia in conditions classified elsewhere: Secondary | ICD-10-CM | POA: Diagnosis not present

## 2019-05-03 DIAGNOSIS — G4733 Obstructive sleep apnea (adult) (pediatric): Secondary | ICD-10-CM | POA: Diagnosis not present

## 2019-06-29 DIAGNOSIS — G4733 Obstructive sleep apnea (adult) (pediatric): Secondary | ICD-10-CM | POA: Diagnosis not present

## 2019-07-04 DIAGNOSIS — E559 Vitamin D deficiency, unspecified: Secondary | ICD-10-CM | POA: Diagnosis not present

## 2019-07-04 DIAGNOSIS — I10 Essential (primary) hypertension: Secondary | ICD-10-CM | POA: Diagnosis not present

## 2019-07-04 DIAGNOSIS — E785 Hyperlipidemia, unspecified: Secondary | ICD-10-CM | POA: Diagnosis not present

## 2019-07-04 DIAGNOSIS — R413 Other amnesia: Secondary | ICD-10-CM | POA: Diagnosis not present

## 2019-07-04 DIAGNOSIS — F3341 Major depressive disorder, recurrent, in partial remission: Secondary | ICD-10-CM | POA: Diagnosis not present

## 2019-07-04 DIAGNOSIS — F332 Major depressive disorder, recurrent severe without psychotic features: Secondary | ICD-10-CM | POA: Diagnosis not present

## 2019-07-04 DIAGNOSIS — E039 Hypothyroidism, unspecified: Secondary | ICD-10-CM | POA: Diagnosis not present

## 2019-07-04 DIAGNOSIS — M85859 Other specified disorders of bone density and structure, unspecified thigh: Secondary | ICD-10-CM | POA: Diagnosis not present

## 2019-07-04 DIAGNOSIS — Z0000 Encounter for general adult medical examination without abnormal findings: Secondary | ICD-10-CM | POA: Diagnosis not present

## 2019-07-04 DIAGNOSIS — R635 Abnormal weight gain: Secondary | ICD-10-CM | POA: Diagnosis not present

## 2019-07-04 DIAGNOSIS — G4733 Obstructive sleep apnea (adult) (pediatric): Secondary | ICD-10-CM | POA: Diagnosis not present

## 2019-07-04 DIAGNOSIS — E1169 Type 2 diabetes mellitus with other specified complication: Secondary | ICD-10-CM | POA: Diagnosis not present

## 2019-07-04 DIAGNOSIS — F419 Anxiety disorder, unspecified: Secondary | ICD-10-CM | POA: Diagnosis not present

## 2019-08-01 ENCOUNTER — Ambulatory Visit: Payer: Self-pay | Admitting: General Surgery

## 2019-08-01 DIAGNOSIS — N6089 Other benign mammary dysplasias of unspecified breast: Secondary | ICD-10-CM | POA: Diagnosis not present

## 2019-08-01 NOTE — H&P (View-Only) (Signed)
Danielle [name] Appointment: 08/01/2019 2:50 PM Location: [facility] Patient #: [id number] DOB: 1945-02-17 Widowed / Language: Danielle [name] / Race: White Female   History of Present Illness Danielle Neri E. Grandville Silos MD; 08/01/2019 2:41 PM) The patient is a 74 year old female who presents with a complaint of Mass. Danielle [name] presents for evaluation a cyst on her medial left breast. She reports she had a sebaceous cyst in the past at a different location required excision. She feels this is the same and it is starting to bother her more. It has not had any drainage or infection type symptoms but she would like it removed. It rubs on her bra causing her discomfort.   Past Surgical History [name], [place]; 08/01/2019 2:32 PM) Appendectomy  Cataract Surgery  Bilateral. Hysterectomy (not due to cancer) - Complete  Oral Surgery  Spinal Surgery - Neck   Diagnostic Studies History [name], [place]; 08/01/2019 2:32 PM) Mammogram  within last year  Allergies [name], RMA; 08/01/2019 2:34 PM) Ceftin *CEPHALOSPORINS*  Hives, Swelling. Codeine Phosphate *ANALGESICS - OPIOID*  Nausea. Compazine *ANTIPSYCHOTICS/ANTIMANIC AGENTS*  Swelling.  Medication History [name], [place]; 08/01/2019 2:39 PM) Aspirin (81MG  Tablet Chewable, Oral) Active. Calcium-Vitamin D (150MG  Tablet, Oral) Active. ZyrTEC Allergy (10MG  Capsule, Oral) Active. Cymbalta (30MG  Capsule DR Part, Oral) Active. hydroCHLOROthiazide (25MG  Tablet, Oral) Active. Synthroid (137MCG Tablet, Oral) Active. Levothroid (137MCG Tablet, Oral) Active. Losartan Potassium (50MG  Tablet, Oral) Active. Magnesium (100MG  Capsule, Oral) Active. Protonix (40MG  Packet, Oral) Active. Crestor (40MG  Tablet, Oral) Active. Cyanocobalamin (500MCG/0.1ML Gel, Nasal) Active. Cyanocobalamin (500MCG Tablet Chewable, Oral) Active.  Social History Danielle [name], [place]; 08/01/2019 2:32 PM) Alcohol use  Occasional alcohol  use. Caffeine use  Carbonated beverages. No drug use  Tobacco use  Never smoker.  Family History [name], [place]; 08/01/2019 2:32 PM) Cancer  Mother. Heart Disease  Mother. Hypertension  Mother.  Pregnancy / Birth History [name], [place]; 08/01/2019 2:32 PM) Age at menarche  51 years. Age of menopause  <46 Gravida  3 Para  2  Other Problems [name], [place]; 08/01/2019 2:32 PM) Anxiety Disorder  Arthritis  Back Pain  Cholelithiasis  Depression  Diabetes Mellitus  Gastroesophageal Reflux Disease  High blood pressure  Hypercholesterolemia  Sleep Apnea     Review of Systems [name] RMA; 08/01/2019 2:32 PM) HEENT Present- Seasonal Allergies and Wears glasses/contact lenses. Not Present- Earache, Hearing Loss, Hoarseness, Nose Bleed, Oral Ulcers, Ringing in the Ears, Sinus Pain, Sore Throat, Visual Disturbances and Yellow Eyes. Respiratory Present- Chronic Cough and Snoring. Not Present- Bloody sputum, Difficulty Breathing and Wheezing. Breast Present- Skin Changes. Not Present- Breast Mass, Breast Pain and Nipple Discharge. Cardiovascular Present- Swelling of Extremities. Not Present- Chest Pain, Difficulty Breathing Lying Down, Leg Cramps, Palpitations, Rapid Heart Rate and Shortness of Breath. Gastrointestinal Present- Hemorrhoids and Rectal Pain. Not Present- Abdominal Pain, Bloating, Bloody Stool, Change in Bowel Habits, Chronic diarrhea, Constipation, Difficulty Swallowing, Excessive gas, Gets full quickly at meals, Indigestion, Nausea and Vomiting. Neurological Present- Decreased Memory. Not Present- Fainting, Headaches, Numbness, Seizures, Tingling, Tremor, Trouble walking and Weakness. Psychiatric Present- Depression. Not Present- Anxiety, Bipolar, Change in Sleep Pattern, Fearful and Frequent crying. Endocrine Present- Hair Changes and New Diabetes. Not Present- Cold Intolerance, Excessive Hunger, Heat Intolerance and Hot  flashes.   Physical Exam Danielle Neri E. Grandville Silos MD; 08/01/2019 2:42 PM) General Mental Status-Alert. General Appearance-Consistent with stated age. Hydration-Well hydrated. Voice-Normal.  Head and Neck Head-normocephalic, atraumatic with no lesions or palpable masses. Trachea-midline. Thyroid Gland Characteristics -  normal size and consistency.  Eye Eyeball - Bilateral-Extraocular movements intact. Sclera/Conjunctiva - Bilateral-No scleral icterus.  Chest and Lung Exam Chest and lung exam reveals -quiet, even and easy respiratory effort with no use of accessory muscles and on auscultation, normal breath sounds, no adventitious sounds and normal vocal resonance. Inspection Chest Wall - Normal. Back - normal.  Breast Note: 2 cm sebaceous cyst medial left breast without cellulitis   Cardiovascular Cardiovascular examination reveals -normal heart sounds, regular rate and rhythm with no murmurs and normal pedal pulses bilaterally.  Abdomen Inspection Inspection of the abdomen reveals - No Hernias. Palpation/Percussion Palpation and Percussion of the abdomen reveal - Soft, Non Tender, No Rebound tenderness, No Rigidity (guarding) and No hepatosplenomegaly. Auscultation Auscultation of the abdomen reveals - Bowel sounds normal.  Neurologic Neurologic evaluation reveals -alert and oriented x 3 with no impairment of recent or remote memory. Mental Status-Normal.  Musculoskeletal Global Assessment -Note: no gross deformities.  Normal Exam - Left-Upper Extremity Strength Normal and Lower Extremity Strength Normal. Normal Exam - Right-Upper Extremity Strength Normal and Lower Extremity Strength Normal.  Lymphatic Head & Neck  General Head & Neck Lymphatics: Bilateral - Description - Normal. Axillary  General Axillary Region: Bilateral - Description - Normal. Tenderness - Non Tender. Femoral & Inguinal  Generalized Femoral & Inguinal  Lymphatics: Bilateral - Description - No Generalized lymphadenopathy.    Assessment & Plan Danielle Neri E. Grandville Silos MD; 08/01/2019 2:43 PM) SEBACEOUS CYST OF BREAST (N60.89) Impression: Sebaceous cyst of left breast. I have offered excision as an outpatient surgical procedure. I discussed the procedure, risks, and benefits. Her cardiologist is Dr. Oval Linsey. She will see her again the first week in August. I do not expect any cardiac clearance needed as her last visit showed her ejection fraction was quite good. We will plan to schedule later in August. She is agreeable.

## 2019-08-01 NOTE — H&P (Signed)
Danielle [name] Appointment: 08/01/2019 2:50 PM Location: [facility] Patient #: [id number] DOB: [birth year] Widowed / Language: Cleophus [name] / Race: White Female   History of Present Illness Danielle Neri E. Grandville Silos MD; 08/01/2019 2:41 PM) The patient is a 74 year old female who presents with a complaint of Mass. [name] presents for evaluation a cyst on her medial left breast. She reports she had a sebaceous cyst in the past at a different location required excision. She feels this is the same and it is starting to bother her more. It has not had any drainage or infection type symptoms but she would like it removed. It rubs on her bra causing her discomfort.   Past Surgical History [name], [place]; 08/01/2019 2:32 PM) Appendectomy  Cataract Surgery  Bilateral. Hysterectomy (not due to cancer) - Complete  Oral Surgery  Spinal Surgery - Neck   Diagnostic Studies History [name], [place]; 08/01/2019 2:32 PM) Mammogram  within last year  Allergies [name], RMA; 08/01/2019 2:34 PM) Ceftin *CEPHALOSPORINS*  Hives, Swelling. Codeine Phosphate *ANALGESICS - OPIOID*  Nausea. Compazine *ANTIPSYCHOTICS/ANTIMANIC AGENTS*  Swelling.  Medication History [name], [place]; 08/01/2019 2:39 PM) Aspirin (81MG  Tablet Chewable, Oral) Active. Calcium-Vitamin D (150MG  Tablet, Oral) Active. ZyrTEC Allergy (10MG  Capsule, Oral) Active. Cymbalta (30MG  Capsule DR Part, Oral) Active. hydroCHLOROthiazide (25MG  Tablet, Oral) Active. Synthroid (137MCG Tablet, Oral) Active. Levothroid (137MCG Tablet, Oral) Active. Losartan Potassium (50MG  Tablet, Oral) Active. Magnesium (100MG  Capsule, Oral) Active. Protonix (40MG  Packet, Oral) Active. Crestor (40MG  Tablet, Oral) Active. Cyanocobalamin (500MCG/0.1ML Gel, Nasal) Active. Cyanocobalamin (500MCG Tablet Chewable, Oral) Active.  Social History Danielle [name] [place], [place]; 08/01/2019 2:32 PM) Alcohol use  Occasional alcohol  use. Caffeine use  Carbonated beverages. No drug use  Tobacco use  Never smoker.  Family History [name], [place]; 08/01/2019 2:32 PM) Cancer  Mother. Heart Disease  Mother. Hypertension  Mother.  Pregnancy / Birth History [name], [place]; 08/01/2019 2:32 PM) Age at menarche  28 years. Age of menopause  <46 Gravida  3 Para  2  Other Problems [name], [place]; 08/01/2019 2:32 PM) Anxiety Disorder  Arthritis  Back Pain  Cholelithiasis  Depression  Diabetes Mellitus  Gastroesophageal Reflux Disease  High blood pressure  Hypercholesterolemia  Sleep Apnea     Review of Systems [name] RMA; 08/01/2019 2:32 PM) HEENT Present- Seasonal Allergies and Wears glasses/contact lenses. Not Present- Earache, Hearing Loss, Hoarseness, Nose Bleed, Oral Ulcers, Ringing in the Ears, Sinus Pain, Sore Throat, Visual Disturbances and Yellow Eyes. Respiratory Present- Chronic Cough and Snoring. Not Present- Bloody sputum, Difficulty Breathing and Wheezing. Breast Present- Skin Changes. Not Present- Breast Mass, Breast Pain and Nipple Discharge. Cardiovascular Present- Swelling of Extremities. Not Present- Chest Pain, Difficulty Breathing Lying Down, Leg Cramps, Palpitations, Rapid Heart Rate and Shortness of Breath. Gastrointestinal Present- Hemorrhoids and Rectal Pain. Not Present- Abdominal Pain, Bloating, Bloody Stool, Change in Bowel Habits, Chronic diarrhea, Constipation, Difficulty Swallowing, Excessive gas, Gets full quickly at meals, Indigestion, Nausea and Vomiting. Neurological Present- Decreased Memory. Not Present- Fainting, Headaches, Numbness, Seizures, Tingling, Tremor, Trouble walking and Weakness. Psychiatric Present- Depression. Not Present- Anxiety, Bipolar, Change in Sleep Pattern, Fearful and Frequent crying. Endocrine Present- Hair Changes and New Diabetes. Not Present- Cold Intolerance, Excessive Hunger, Heat Intolerance and Hot  flashes.   Physical Exam Danielle Neri E. Grandville Silos MD; 08/01/2019 2:42 PM) General Mental Status-Alert. General Appearance-Consistent with stated age. Hydration-Well hydrated. Voice-Normal.  Head and Neck Head-normocephalic, atraumatic with no lesions or palpable masses. Trachea-midline. Thyroid Gland Characteristics -  normal size and consistency.  Eye Eyeball - Bilateral-Extraocular movements intact. Sclera/Conjunctiva - Bilateral-No scleral icterus.  Chest and Lung Exam Chest and lung exam reveals -quiet, even and easy respiratory effort with no use of accessory muscles and on auscultation, normal breath sounds, no adventitious sounds and normal vocal resonance. Inspection Chest Wall - Normal. Back - normal.  Breast Note: 2 cm sebaceous cyst medial left breast without cellulitis   Cardiovascular Cardiovascular examination reveals -normal heart sounds, regular rate and rhythm with no murmurs and normal pedal pulses bilaterally.  Abdomen Inspection Inspection of the abdomen reveals - No Hernias. Palpation/Percussion Palpation and Percussion of the abdomen reveal - Soft, Non Tender, No Rebound tenderness, No Rigidity (guarding) and No hepatosplenomegaly. Auscultation Auscultation of the abdomen reveals - Bowel sounds normal.  Neurologic Neurologic evaluation reveals -alert and oriented x 3 with no impairment of recent or remote memory. Mental Status-Normal.  Musculoskeletal Global Assessment -Note: no gross deformities.  Normal Exam - Left-Upper Extremity Strength Normal and Lower Extremity Strength Normal. Normal Exam - Right-Upper Extremity Strength Normal and Lower Extremity Strength Normal.  Lymphatic Head & Neck  General Head & Neck Lymphatics: Bilateral - Description - Normal. Axillary  General Axillary Region: Bilateral - Description - Normal. Tenderness - Non Tender. Femoral & Inguinal  Generalized Femoral & Inguinal  Lymphatics: Bilateral - Description - No Generalized lymphadenopathy.    Assessment & Plan Danielle Neri E. Grandville Silos MD; 08/01/2019 2:43 PM) SEBACEOUS CYST OF BREAST (N60.89) Impression: Sebaceous cyst of left breast. I have offered excision as an outpatient surgical procedure. I discussed the procedure, risks, and benefits. Her cardiologist is Dr. Oval Linsey. She will see her again the first week in August. I do not expect any cardiac clearance needed as her last visit showed her ejection fraction was quite good. We will plan to schedule later in August. She is agreeable.

## 2019-08-03 DIAGNOSIS — F332 Major depressive disorder, recurrent severe without psychotic features: Secondary | ICD-10-CM | POA: Diagnosis not present

## 2019-08-09 ENCOUNTER — Encounter (HOSPITAL_BASED_OUTPATIENT_CLINIC_OR_DEPARTMENT_OTHER): Payer: Self-pay | Admitting: General Surgery

## 2019-08-09 ENCOUNTER — Other Ambulatory Visit: Payer: Self-pay

## 2019-08-11 ENCOUNTER — Other Ambulatory Visit: Payer: Self-pay

## 2019-08-11 ENCOUNTER — Other Ambulatory Visit (HOSPITAL_COMMUNITY)
Admission: RE | Admit: 2019-08-11 | Discharge: 2019-08-11 | Disposition: A | Discharge status: Home / Self Care | Payer: Medicare HMO | Source: Ambulatory Visit | Attending: General Surgery | Admitting: General Surgery

## 2019-08-11 ENCOUNTER — Encounter (HOSPITAL_BASED_OUTPATIENT_CLINIC_OR_DEPARTMENT_OTHER)
Admission: RE | Admit: 2019-08-11 | Discharge: 2019-08-11 | Disposition: A | Discharge status: Home / Self Care | Payer: Medicare HMO | Source: Ambulatory Visit | Attending: General Surgery | Admitting: General Surgery

## 2019-08-11 DIAGNOSIS — Z20822 Contact with and (suspected) exposure to covid-19: Secondary | ICD-10-CM | POA: Insufficient documentation

## 2019-08-11 DIAGNOSIS — Z01812 Encounter for preprocedural laboratory examination: Secondary | ICD-10-CM | POA: Insufficient documentation

## 2019-08-11 LAB — BASIC METABOLIC PANEL
Anion gap: 10 (ref 5–15)
BUN: 15 mg/dL (ref 8–23)
CO2: 25 mmol/L (ref 22–32)
Calcium: 9.5 mg/dL (ref 8.9–10.3)
Chloride: 104 mmol/L (ref 98–111)
Creatinine, Ser: 0.92 mg/dL (ref 0.44–1.00)
GFR calc Af Amer: >60 mL/min (ref >60)
GFR calc non Af Amer: >60 mL/min (ref >60)
Glucose, Bld: 122 mg/dL — ABNORMAL HIGH (ref 70–99)
Potassium: 4.5 mmol/L (ref 3.5–5.1)
Sodium: 139 mmol/L (ref 135–145)

## 2019-08-11 LAB — SARS CORONAVIRUS 2 (TAT 6-24 HRS): SARS Coronavirus 2: NEGATIVE

## 2019-08-11 MED ORDER — CHLORHEXIDINE GLUCONATE CLOTH 2 % EX PADS: 6.0000 | MEDICATED_PAD | Freq: Once | CUTANEOUS | Status: DC

## 2019-08-11 NOTE — Progress Notes (Signed)
      Enhanced Recovery after Surgery for Orthopedics Enhanced Recovery after Surgery is a protocol used to improve the stress on your body and your recovery after surgery.  Patient Instructions  . The night before surgery:  o No food after midnight. ONLY clear liquids after midnight  . The day of surgery (if you do NOT have diabetes):  o Drink ONE (1) Pre-Surgery Clear Ensure as directed.   o This drink was given to you during your hospital  pre-op appointment visit. o The pre-op nurse will instruct you on the time to drink the  Pre-Surgery Ensure depending on your surgery time. o Finish the drink at the designated time by the pre-op nurse.  o Nothing else to drink after completing the  Pre-Surgery Clear Ensure.  . The day of surgery (if you have diabetes): o Drink ONE (1) Gatorade 2 (G2) as directed. o This drink was given to you during your hospital  pre-op appointment visit.  o The pre-op nurse will instruct you on the time to drink the   Gatorade 2 (G2) depending on your surgery time. o Color of the Gatorade may vary. Red is not allowed. o Nothing else to drink after completing the  Gatorade 2 (G2).  Surgical soap given to patient, instructions given, patient verbalized understanding.           If you have questions, please contact your surgeon's office.

## 2019-08-15 ENCOUNTER — Ambulatory Visit (HOSPITAL_BASED_OUTPATIENT_CLINIC_OR_DEPARTMENT_OTHER): Payer: Medicare HMO | Admitting: Certified Registered Nurse Anesthetist

## 2019-08-15 ENCOUNTER — Other Ambulatory Visit: Payer: Self-pay

## 2019-08-15 ENCOUNTER — Encounter (HOSPITAL_BASED_OUTPATIENT_CLINIC_OR_DEPARTMENT_OTHER): Payer: Self-pay | Admitting: General Surgery

## 2019-08-15 ENCOUNTER — Ambulatory Visit (HOSPITAL_BASED_OUTPATIENT_CLINIC_OR_DEPARTMENT_OTHER)
Admission: RE | Admit: 2019-08-15 | Discharge: 2019-08-15 | Disposition: A | Discharge status: Home / Self Care | Payer: Medicare HMO | Attending: General Surgery | Admitting: General Surgery

## 2019-08-15 ENCOUNTER — Encounter (HOSPITAL_BASED_OUTPATIENT_CLINIC_OR_DEPARTMENT_OTHER)
Admission: RE | Disposition: A | Discharge status: Home / Self Care | Payer: Self-pay | Source: Home / Self Care | Attending: General Surgery

## 2019-08-15 DIAGNOSIS — Z6841 Body Mass Index (BMI) 40.0 and over, adult: Secondary | ICD-10-CM | POA: Insufficient documentation

## 2019-08-15 DIAGNOSIS — Z79890 Hormone replacement therapy (postmenopausal): Secondary | ICD-10-CM | POA: Diagnosis not present

## 2019-08-15 DIAGNOSIS — Z885 Allergy status to narcotic agent status: Secondary | ICD-10-CM | POA: Diagnosis not present

## 2019-08-15 DIAGNOSIS — N6082 Other benign mammary dysplasias of left breast: Secondary | ICD-10-CM | POA: Diagnosis not present

## 2019-08-15 DIAGNOSIS — Z79899 Other long term (current) drug therapy: Secondary | ICD-10-CM | POA: Insufficient documentation

## 2019-08-15 DIAGNOSIS — E785 Hyperlipidemia, unspecified: Secondary | ICD-10-CM | POA: Diagnosis not present

## 2019-08-15 DIAGNOSIS — J9601 Acute respiratory failure with hypoxia: Secondary | ICD-10-CM | POA: Diagnosis not present

## 2019-08-15 DIAGNOSIS — Z7982 Long term (current) use of aspirin: Secondary | ICD-10-CM | POA: Diagnosis not present

## 2019-08-15 DIAGNOSIS — Z881 Allergy status to other antibiotic agents status: Secondary | ICD-10-CM | POA: Diagnosis not present

## 2019-08-15 DIAGNOSIS — E119 Type 2 diabetes mellitus without complications: Secondary | ICD-10-CM | POA: Diagnosis not present

## 2019-08-15 DIAGNOSIS — M1990 Unspecified osteoarthritis, unspecified site: Secondary | ICD-10-CM | POA: Insufficient documentation

## 2019-08-15 DIAGNOSIS — E7800 Pure hypercholesterolemia, unspecified: Secondary | ICD-10-CM | POA: Diagnosis not present

## 2019-08-15 DIAGNOSIS — Z888 Allergy status to other drugs, medicaments and biological substances status: Secondary | ICD-10-CM | POA: Diagnosis not present

## 2019-08-15 DIAGNOSIS — K219 Gastro-esophageal reflux disease without esophagitis: Secondary | ICD-10-CM | POA: Insufficient documentation

## 2019-08-15 DIAGNOSIS — L720 Epidermal cyst: Secondary | ICD-10-CM | POA: Diagnosis not present

## 2019-08-15 DIAGNOSIS — F329 Major depressive disorder, single episode, unspecified: Secondary | ICD-10-CM | POA: Insufficient documentation

## 2019-08-15 DIAGNOSIS — E039 Hypothyroidism, unspecified: Secondary | ICD-10-CM | POA: Insufficient documentation

## 2019-08-15 DIAGNOSIS — F419 Anxiety disorder, unspecified: Secondary | ICD-10-CM | POA: Insufficient documentation

## 2019-08-15 DIAGNOSIS — G4730 Sleep apnea, unspecified: Secondary | ICD-10-CM | POA: Diagnosis not present

## 2019-08-15 DIAGNOSIS — I10 Essential (primary) hypertension: Secondary | ICD-10-CM | POA: Insufficient documentation

## 2019-08-15 HISTORY — PX: MASS EXCISION: SHX2000

## 2019-08-15 HISTORY — DX: Type 2 diabetes mellitus without complications: E11.9

## 2019-08-15 LAB — GLUCOSE, CAPILLARY
Glucose-Capillary: 166 mg/dL — ABNORMAL HIGH (ref 70–99)
Glucose-Capillary: 166 mg/dL — ABNORMAL HIGH (ref 70–99)
Glucose-Capillary: 164 mg/dL — ABNORMAL HIGH (ref 70–99)

## 2019-08-15 SURGERY — EXCISION MASS
Anesthesia: General | Site: Breast | Laterality: Left

## 2019-08-15 MED ORDER — ARTIFICIAL TEARS OPHTHALMIC OINT
TOPICAL_OINTMENT | OPHTHALMIC | Status: AC
  Filled 2019-08-15: qty 3.5

## 2019-08-15 MED ORDER — MIDAZOLAM HCL 2 MG/2ML IJ SOLN
INTRAMUSCULAR | Status: DC | PRN
  Administered 2019-08-15 (×2): 1 mg via INTRAVENOUS

## 2019-08-15 MED ORDER — AMISULPRIDE (ANTIEMETIC) 5 MG/2ML IV SOLN: 10.0000 mg | Freq: Once | INTRAVENOUS | Status: DC | PRN

## 2019-08-15 MED ORDER — FENTANYL CITRATE (PF) 100 MCG/2ML IJ SOLN
INTRAMUSCULAR | Status: AC
  Filled 2019-08-15: qty 2

## 2019-08-15 MED ORDER — ONDANSETRON HCL 4 MG/2ML IJ SOLN
INTRAMUSCULAR | Status: AC
  Filled 2019-08-15: qty 2

## 2019-08-15 MED ORDER — GLYCOPYRROLATE 0.2 MG/ML IJ SOLN
INTRAMUSCULAR | Status: AC
  Filled 2019-08-15: qty 3

## 2019-08-15 MED ORDER — LIDOCAINE-EPINEPHRINE 1 %-1:100000 IJ SOLN
INTRAMUSCULAR | Status: DC | PRN
  Administered 2019-08-15: 10 mL

## 2019-08-15 MED ORDER — OXYCODONE HCL 5 MG PO TABS
5.0000 mg | ORAL_TABLET | Freq: Four times a day (QID) | ORAL | 0 refills | Status: DC | PRN
Start: 2019-08-15 — End: 2021-10-09

## 2019-08-15 MED ORDER — GLYCOPYRROLATE 0.2 MG/ML IJ SOLN
INTRAMUSCULAR | Status: DC | PRN
Start: 2019-08-15 — End: 2019-08-15
  Administered 2019-08-15: .2 mg via INTRAVENOUS

## 2019-08-15 MED ORDER — HYDROMORPHONE HCL 1 MG/ML IJ SOLN
INTRAMUSCULAR | Status: AC
  Filled 2019-08-15: qty 0.5

## 2019-08-15 MED ORDER — OXYCODONE HCL 5 MG PO TABS: 5.0000 mg | ORAL_TABLET | Freq: Once | ORAL | Status: DC | PRN

## 2019-08-15 MED ORDER — FENTANYL CITRATE (PF) 100 MCG/2ML IJ SOLN
INTRAMUSCULAR | Status: DC | PRN
  Administered 2019-08-15: 25 ug via INTRAVENOUS
  Administered 2019-08-15: 50 ug via INTRAVENOUS
  Administered 2019-08-15: 25 ug via INTRAVENOUS

## 2019-08-15 MED ORDER — CIPROFLOXACIN IN D5W 400 MG/200ML IV SOLN
400.0000 mg | INTRAVENOUS | Status: AC
  Administered 2019-08-15: 400 mg via INTRAVENOUS

## 2019-08-15 MED ORDER — OXYCODONE HCL 5 MG/5ML PO SOLN: 5.0000 mg | Freq: Once | ORAL | Status: DC | PRN

## 2019-08-15 MED ORDER — LIDOCAINE 2% (20 MG/ML) 5 ML SYRINGE
INTRAMUSCULAR | Status: AC
  Filled 2019-08-15: qty 5

## 2019-08-15 MED ORDER — ARTIFICIAL TEARS OPHTHALMIC OINT
TOPICAL_OINTMENT | OPHTHALMIC | Status: DC | PRN
  Administered 2019-08-15: 1 via OPHTHALMIC

## 2019-08-15 MED ORDER — CELECOXIB 200 MG PO CAPS
ORAL_CAPSULE | ORAL | Status: AC
  Filled 2019-08-15: qty 2

## 2019-08-15 MED ORDER — ACETAMINOPHEN 500 MG PO TABS
ORAL_TABLET | ORAL | Status: AC
  Filled 2019-08-15: qty 2

## 2019-08-15 MED ORDER — DEXAMETHASONE SODIUM PHOSPHATE 10 MG/ML IJ SOLN
INTRAMUSCULAR | Status: DC | PRN
  Administered 2019-08-15: 5 mg via INTRAVENOUS

## 2019-08-15 MED ORDER — PROPOFOL 10 MG/ML IV BOLUS
INTRAVENOUS | Status: DC | PRN
  Administered 2019-08-15: 160 mg via INTRAVENOUS

## 2019-08-15 MED ORDER — DEXAMETHASONE SODIUM PHOSPHATE 10 MG/ML IJ SOLN
INTRAMUSCULAR | Status: AC
  Filled 2019-08-15: qty 1

## 2019-08-15 MED ORDER — ACETAMINOPHEN 500 MG PO TABS
1000.0000 mg | ORAL_TABLET | ORAL | Status: AC
  Administered 2019-08-15: 1000 mg via ORAL

## 2019-08-15 MED ORDER — HYDROMORPHONE HCL 1 MG/ML IJ SOLN
0.2500 mg | INTRAMUSCULAR | Status: DC | PRN
  Administered 2019-08-15: 0.25 mg via INTRAVENOUS

## 2019-08-15 MED ORDER — CIPROFLOXACIN IN D5W 400 MG/200ML IV SOLN
INTRAVENOUS | Status: AC
  Filled 2019-08-15: qty 200

## 2019-08-15 MED ORDER — LIDOCAINE HCL (CARDIAC) PF 100 MG/5ML IV SOSY
PREFILLED_SYRINGE | INTRAVENOUS | Status: DC | PRN
  Administered 2019-08-15: 60 mg via INTRAVENOUS

## 2019-08-15 MED ORDER — LACTATED RINGERS IV SOLN: INTRAVENOUS | Status: DC

## 2019-08-15 MED ORDER — ONDANSETRON HCL 4 MG/2ML IJ SOLN
INTRAMUSCULAR | Status: DC | PRN
  Administered 2019-08-15: 4 mg via INTRAVENOUS

## 2019-08-15 MED ORDER — MIDAZOLAM HCL 2 MG/2ML IJ SOLN
INTRAMUSCULAR | Status: AC
  Filled 2019-08-15: qty 2

## 2019-08-15 MED ORDER — GABAPENTIN 300 MG PO CAPS
300.0000 mg | ORAL_CAPSULE | ORAL | Status: AC
  Administered 2019-08-15: 300 mg via ORAL

## 2019-08-15 MED ORDER — PROPOFOL 10 MG/ML IV BOLUS
INTRAVENOUS | Status: AC
  Filled 2019-08-15: qty 20

## 2019-08-15 MED ORDER — GABAPENTIN 300 MG PO CAPS
ORAL_CAPSULE | ORAL | Status: AC
  Filled 2019-08-15: qty 1

## 2019-08-15 SURGICAL SUPPLY — 58 items
ADH SKN CLS APL DERMABOND .7 (GAUZE/BANDAGES/DRESSINGS) ×1
APL PRP STRL LF DISP 70% ISPRP (MISCELLANEOUS) ×1
APL SKNCLS STERI-STRIP NONHPOA (GAUZE/BANDAGES/DRESSINGS)
BENZOIN TINCTURE PRP APPL 2/3 (GAUZE/BANDAGES/DRESSINGS) IMPLANT
BLADE CLIPPER SURG (BLADE) IMPLANT
BLADE HEX COATED 2.75 (ELECTRODE) ×3 IMPLANT
BLADE SURG 15 STRL LF DISP TIS (BLADE) ×1 IMPLANT
BLADE SURG 15 STRL SS (BLADE) ×3
CANISTER SUCT 1200ML W/VALVE (MISCELLANEOUS) IMPLANT
CHLORAPREP W/TINT 26 (MISCELLANEOUS) ×3 IMPLANT
CLOSURE WOUND 1/2 X4 (GAUZE/BANDAGES/DRESSINGS)
COVER BACK TABLE 60X90IN (DRAPES) ×3 IMPLANT
COVER MAYO STAND STRL (DRAPES) ×3 IMPLANT
COVER WAND RF STERILE (DRAPES) IMPLANT
DECANTER SPIKE VIAL GLASS SM (MISCELLANEOUS) ×3 IMPLANT
DERMABOND ADVANCED (GAUZE/BANDAGES/DRESSINGS) ×2
DERMABOND ADVANCED .7 DNX12 (GAUZE/BANDAGES/DRESSINGS) IMPLANT
DRAPE LAPAROTOMY 100X72 PEDS (DRAPES) ×3 IMPLANT
DRAPE U-SHAPE 76X120 STRL (DRAPES) IMPLANT
DRAPE UTILITY XL STRL (DRAPES) ×3 IMPLANT
ELECT REM PT RETURN 9FT ADLT (ELECTROSURGICAL) ×3
ELECTRODE REM PT RTRN 9FT ADLT (ELECTROSURGICAL) ×1 IMPLANT
GAUZE SPONGE 4X4 12PLY STRL (GAUZE/BANDAGES/DRESSINGS) IMPLANT
GAUZE SPONGE 4X4 12PLY STRL LF (GAUZE/BANDAGES/DRESSINGS) ×3 IMPLANT
GLOVE BIO SURGEON STRL SZ8 (GLOVE) ×3 IMPLANT
GLOVE BIOGEL PI IND STRL 8 (GLOVE) ×1 IMPLANT
GLOVE BIOGEL PI INDICATOR 8 (GLOVE) ×2
GOWN STRL REUS W/ TWL LRG LVL3 (GOWN DISPOSABLE) ×1 IMPLANT
GOWN STRL REUS W/ TWL XL LVL3 (GOWN DISPOSABLE) ×1 IMPLANT
GOWN STRL REUS W/TWL LRG LVL3 (GOWN DISPOSABLE) ×3
GOWN STRL REUS W/TWL XL LVL3 (GOWN DISPOSABLE) ×3
NDL HYPO 25X1 1.5 SAFETY (NEEDLE) ×1 IMPLANT
NDL PRECISIONGLIDE 27X1.5 (NEEDLE) IMPLANT
NEEDLE HYPO 25X1 1.5 SAFETY (NEEDLE) ×3 IMPLANT
NEEDLE PRECISIONGLIDE 27X1.5 (NEEDLE) IMPLANT
NS IRRIG 1000ML POUR BTL (IV SOLUTION) IMPLANT
PACK BASIN DAY SURGERY FS (CUSTOM PROCEDURE TRAY) ×3 IMPLANT
PENCIL SMOKE EVACUATOR (MISCELLANEOUS) ×3 IMPLANT
SHEET MEDIUM DRAPE 40X70 STRL (DRAPES) IMPLANT
SLEEVE SCD COMPRESS KNEE MED (MISCELLANEOUS) IMPLANT
SPONGE LAP 18X18 RF (DISPOSABLE) IMPLANT
STRIP CLOSURE SKIN 1/2X4 (GAUZE/BANDAGES/DRESSINGS) IMPLANT
SUT ETHILON 3 0 PS 1 (SUTURE) IMPLANT
SUT ETHILON 5 0 PS 2 18 (SUTURE) IMPLANT
SUT MON AB 4-0 PC3 18 (SUTURE) ×2 IMPLANT
SUT SILK 2 0 PERMA HAND 18 BK (SUTURE) IMPLANT
SUT VIC AB 3-0 PS1 18 (SUTURE)
SUT VIC AB 3-0 PS1 18XBRD (SUTURE) IMPLANT
SUT VIC AB 3-0 SH 27 (SUTURE)
SUT VIC AB 3-0 SH 27X BRD (SUTURE) IMPLANT
SUT VIC AB 4-0 SH 18 (SUTURE) ×3 IMPLANT
SUT VIC AB 5-0 PS2 18 (SUTURE) IMPLANT
SYR BULB EAR ULCER 3OZ GRN STR (SYRINGE) IMPLANT
SYR CONTROL 10ML LL (SYRINGE) ×3 IMPLANT
TOWEL GREEN STERILE FF (TOWEL DISPOSABLE) ×6 IMPLANT
TUBE CONNECTING 20'X1/4 (TUBING)
TUBE CONNECTING 20X1/4 (TUBING) IMPLANT
YANKAUER SUCT BULB TIP NO VENT (SUCTIONS) IMPLANT

## 2019-08-15 NOTE — Anesthesia Preprocedure Evaluation (Addendum)
Anesthesia Evaluation  Patient identified by MRN, date of birth, ID band Patient awake    Reviewed: Allergy & Precautions, H&P , NPO status , Patient's Chart, lab work & pertinent test results, reviewed documented beta blocker date and time   Airway Mallampati: II  TM Distance: <3 FB Neck ROM: Limited    Dental no notable dental hx. (+) Dental Advisory Given   Pulmonary sleep apnea and Continuous Positive Airway Pressure Ventilation ,    Pulmonary exam normal breath sounds clear to auscultation       Cardiovascular hypertension, Pt. on medications Normal cardiovascular exam Rhythm:Regular Rate:Normal  Denies cardiac symptoms   Neuro/Psych  Headaches, Anxiety Depression negative psych ROS   GI/Hepatic Neg liver ROS, GERD  ,  Endo/Other  diabetes, Type 2Hypothyroidism Morbid obesityThyroid replacement  Renal/GU negative Renal ROS  negative genitourinary   Musculoskeletal  (+) Arthritis , Osteoarthritis,    Abdominal (+) + obese,   Peds negative pediatric ROS (+)  Hematology negative hematology ROS (+)   Anesthesia Other Findings Upper front caps  Reproductive/Obstetrics negative OB ROS                            Anesthesia Physical  Anesthesia Plan  ASA: III  Anesthesia Plan: General   Post-op Pain Management:    Induction: Intravenous  PONV Risk Score and Plan: 3 and Ondansetron, Dexamethasone, Midazolam and Treatment may vary due to age or medical condition  Airway Management Planned: LMA  Additional Equipment:   Intra-op Plan:   Post-operative Plan: Extubation in OR  Informed Consent:     Dental advisory given  Plan Discussed with: CRNA and Surgeon  Anesthesia Plan Comments:         Anesthesia Quick Evaluation

## 2019-08-15 NOTE — Transfer of Care (Signed)
Immediate Anesthesia Transfer of Care Note  Patient: Danielle [name]  Procedure(s) Performed: EXCISION OF SEBACEOUS CYST LEFT BREAST (Left Breast)  Patient Location: PACU  Anesthesia Type:General  Level of Consciousness: awake, alert , drowsy and patient cooperative  Airway & Oxygen Therapy: Patient Spontanous Breathing and Patient connected to face mask oxygen  Post-op Assessment: Report given to RN, Post -op Vital signs reviewed and stable, Patient moving all extremities X 4 and Patient able to stick tongue midline  Post vital signs: Reviewed and stable  Last Vitals:  Vitals Value Taken Time  BP 123/53 08/15/19 1106  Temp    Pulse 107 08/15/19 1107  Resp 15 08/15/19 1107  SpO2 99 % 08/15/19 1107  Vitals shown include unvalidated device data.  Last Pain:  Vitals:   08/15/19 0844  TempSrc: Oral  PainSc: 0-No pain         Complications: No complications documented.

## 2019-08-15 NOTE — Interval H&P Note (Signed)
History and Physical Interval Note:  08/15/2019 10:22 AM  [place]  has presented today for surgery, with the diagnosis of LEFT SEBACEOUS CYST.  The various methods of treatment have been discussed with the patient and family. After consideration of risks, benefits and other options for treatment, the patient has consented to  Procedure(s): EXCISION OF SEBACEOUS CYST LEFT BREAST (Left) as a surgical intervention.  The patient's history has been reviewed, patient examined, no change in status, stable for surgery.  I have reviewed the patient's chart and labs.  Questions were answered to the patient's satisfaction.     Zenovia Jarred

## 2019-08-15 NOTE — Anesthesia Procedure Notes (Signed)
Procedure Name: LMA Insertion Date/Time: 08/15/2019 10:39 AM Performed by: Collier Bullock, CRNA Pre-anesthesia Checklist: Patient identified, Emergency Drugs available, Suction available and Patient being monitored Patient Re-evaluated:Patient Re-evaluated prior to induction Oxygen Delivery Method: Circle system utilized Preoxygenation: Pre-oxygenation with 100% oxygen Induction Type: IV induction LMA: LMA flexible inserted LMA Size: 4.0 Number of attempts: 1 Placement Confirmation: positive ETCO2 and breath sounds checked- equal and bilateral Tube secured with: Tape

## 2019-08-15 NOTE — Op Note (Signed)
  08/15/2019  11:00 AM  PATIENT:  Danielle [name]  74 y.o. female  PRE-OPERATIVE DIAGNOSIS:  SEBACEOUS CYST LEFT BREAST  POST-OPERATIVE DIAGNOSIS:  SEBACEOUS CYST LEFT BREAST  PROCEDURE:  Procedure(s): EXCISION OF SEBACEOUS CYST LEFT BREAST 1.5CM WITH LAYERED CLOSURE  SURGEON:  Surgeon(s): Georganna Skeans, MD  ASSISTANTS: none   ANESTHESIA:   local and general  EBL:  Total I/O In: 1000 [I.V.:1000] Out: 5 [Blood:5]  BLOOD ADMINISTERED:none  DRAINS: none   SPECIMEN:  Excision  DISPOSITION OF SPECIMEN:  PATHOLOGY  COUNTS:  YES  DICTATION: .Dragon Dictation Procedure in detail: Informed consent was obtained.  Her site was marked.  She received intravenous antibiotics.  She was brought to the operating room and general anesthesia with laryngeal mask airway was administered by the anesthesia staff.  The left breast area was prepped in a sterile fashion.  Timeout procedure was performed.  I injected some local anesthetic.  I made an elliptical incision to encompass her previous scar in the entirety of the cyst.  Subcutaneous tissues were dissected down to and I excised the cyst in its entirety without violating the capsule.  It was sent to pathology.  The wound was irrigated.  Hemostasis was obtained.  I then closed the wound with deep tissue approximated with interrupted 4-0 Vicryl.  Skin was closed with running 4-0 Monocryl.  Dermabond was placed.  All counts were correct.  She tolerated the procedure well without apparent complication and was taken recovery in stable condition. PATIENT DISPOSITION:  PACU - hemodynamically stable.   Delay start of Pharmacological VTE agent (>24hrs) due to surgical blood loss or risk of bleeding:  no  Georganna Skeans, MD, MPH, FACS Pager: [phone]  8/3/202111:00 AM

## 2019-08-15 NOTE — Discharge Instructions (Signed)
  Post Anesthesia Home Care Instructions  Activity: Get plenty of rest for the remainder of the day. A responsible individual must stay with you for 24 hours following the procedure.  For the next 24 hours, DO NOT: -Drive a car -Paediatric nurse -Drink alcoholic beverages -Take any medication unless instructed by your physician -Make any legal decisions or sign important papers.  Meals: Start with liquid foods such as gelatin or soup. Progress to regular foods as tolerated. Avoid greasy, spicy, heavy foods. If nausea and/or vomiting occur, drink only clear liquids until the nausea and/or vomiting subsides. Call your physician if vomiting continues.  Special Instructions/Symptoms: Your throat may feel dry or sore from the anesthesia or the breathing tube placed in your throat during surgery. If this causes discomfort, gargle with warm salt water. The discomfort should disappear within 24 hours.  If you had a scopolamine patch placed behind your ear for the management of post- operative nausea and/or vomiting:  1. The medication in the patch is effective for 72 hours, after which it should be removed.  Wrap patch in a tissue and discard in the trash. Wash hands thoroughly with soap and water. 2. You may remove the patch earlier than 72 hours if you experience unpleasant side effects which may include dry mouth, dizziness or visual disturbances. 3. Avoid touching the patch. Wash your hands with soap and water after contact with the patch.       Nest Tylenol dose may be given at [facility]

## 2019-08-15 NOTE — Anesthesia Postprocedure Evaluation (Signed)
Anesthesia Post Note  Patient: Danielle [name]  Procedure(s) Performed: EXCISION OF SEBACEOUS CYST LEFT BREAST (Left Breast)     Patient location during evaluation: PACU Anesthesia Type: General Level of consciousness: awake and alert Pain management: pain level controlled Vital Signs Assessment: post-procedure vital signs reviewed and stable Respiratory status: spontaneous breathing, nonlabored ventilation and respiratory function stable Cardiovascular status: blood pressure returned to baseline and stable Postop Assessment: no apparent nausea or vomiting Anesthetic complications: no   No complications documented.  Last Vitals:  Vitals:   08/15/19 1207 08/15/19 1237  BP:    Pulse:  (!) 101  Resp:  18  Temp: (!) 36.3 C (!) 36.3 C  SpO2:  94%    Last Pain:  Vitals:   08/15/19 1237  TempSrc:   PainSc: 3                  Lynda Rainwater

## 2019-08-16 ENCOUNTER — Encounter (HOSPITAL_BASED_OUTPATIENT_CLINIC_OR_DEPARTMENT_OTHER): Payer: Self-pay | Admitting: General Surgery

## 2019-08-16 DIAGNOSIS — F332 Major depressive disorder, recurrent severe without psychotic features: Secondary | ICD-10-CM | POA: Diagnosis not present

## 2019-08-16 LAB — SURGICAL PATHOLOGY

## 2019-08-17 ENCOUNTER — Ambulatory Visit (INDEPENDENT_AMBULATORY_CARE_PROVIDER_SITE_OTHER): Payer: Medicare HMO | Admitting: Cardiology

## 2019-08-17 ENCOUNTER — Other Ambulatory Visit: Payer: Self-pay

## 2019-08-17 ENCOUNTER — Encounter: Payer: Self-pay | Admitting: Cardiology

## 2019-08-17 VITALS — BP 118/72 | HR 80 | Ht 62.0 in | Wt 231.4 lb

## 2019-08-17 DIAGNOSIS — E785 Hyperlipidemia, unspecified: Secondary | ICD-10-CM | POA: Diagnosis not present

## 2019-08-17 DIAGNOSIS — I10 Essential (primary) hypertension: Secondary | ICD-10-CM

## 2019-08-17 DIAGNOSIS — F418 Other specified anxiety disorders: Secondary | ICD-10-CM | POA: Diagnosis not present

## 2019-08-17 DIAGNOSIS — G4733 Obstructive sleep apnea (adult) (pediatric): Secondary | ICD-10-CM

## 2019-08-17 DIAGNOSIS — R002 Palpitations: Secondary | ICD-10-CM | POA: Diagnosis not present

## 2019-08-17 MED ORDER — METOPROLOL TARTRATE 25 MG PO TABS: 12.5000 mg | ORAL_TABLET | Freq: Two times a day (BID) | ORAL | 1 refills | Status: DC

## 2019-08-17 NOTE — Assessment & Plan Note (Signed)
Statin rx per PCP 

## 2019-08-17 NOTE — Assessment & Plan Note (Signed)
Controlled.  

## 2019-08-17 NOTE — Assessment & Plan Note (Addendum)
Try PRN Metoprolol. Discussed stress reduction and exercise (consider stationary bike as she has knee and foot issues)

## 2019-08-17 NOTE — Patient Instructions (Signed)
Medication Instructions:   START Metoprolol 25 mg----take 1/2-1 tablet (12.5mg -25mg ) by mouth twice daily.  *If you need a refill on your cardiac medications before your next appointment, please call your pharmacy*   Follow-Up: At [facility], you and your health needs are our priority.  As part of our continuing mission to provide you with exceptional heart care, we have created designated Provider Care Teams.  These Care Teams include your primary Cardiologist (physician) and Advanced Practice Providers (APPs -  Physician Assistants and Nurse Practitioners) who all work together to provide you with the care you need, when you need it.  We recommend signing up for the patient portal called "MyChart".  Sign up information is provided on this After Visit Summary.  MyChart is used to connect with patients for Virtual Visits (Telemedicine).  Patients are able to view lab/test results, encounter notes, upcoming appointments, etc.  Non-urgent messages can be sent to your provider as well.   To learn more about what you can do with MyChart, go to [email or web address].    Your next appointment:   6 month(s)  The format for your next appointment:   In Person  Provider:   You may see Skeet Latch, MD or one of the following Advanced Practice Providers on your designated Care Team:    Kerin Ransom, PA-C  [place], [place]  Coletta Memos, [place]

## 2019-08-17 NOTE — Assessment & Plan Note (Signed)
Compliant with C-pap 

## 2019-08-17 NOTE — Assessment & Plan Note (Signed)
Keep f/u with your psychiatrist

## 2019-08-17 NOTE — Assessment & Plan Note (Signed)
BMI 42 

## 2019-08-17 NOTE — Progress Notes (Signed)
Cardiology Office Note:    Date:  08/17/2019   ID:  Danielle [name], Danielle [name] 14-May-1945, MRN [id number]  PCP:  [name], MD  Cardiologist:  Skeet Latch, MD  Electrophysiologist:  None   Referring MD: [name],*   Chief Complaint  Patient presents with  . Follow-up  . Palpitations  . Shortness of Breath  . Headache  . Edema    left ankle    History of Present Illness:    Danielle [name] is a pleasant 74 y.o. female with a hx of HTN, hypothyroidism, OSA, on C-pap, anxiety and depression, RBBB, and NIDDM.  She was seen in September 2018 for atypical chest pain.  Coronary CTA initially showed a 70% LAD lesion however upon further review using FFR it was negative for obstructive disease.  Her echocardiogram showed normal LV function.  She is on statin therapy.  We last saw her in the office in August 2019.  Patient is seen today for complaints of palpitations.  She describes skipped beats that make her feel short of breath.  Her symptoms have been intermittent in the past but worse over the last 1 to 2 weeks.  She has a great deal of anxiety and stress.  She lost her husband and her son in the last couple years.  She is seeing a psychiatrist.  Her medications for this are being adjusted.  She denies any sustained tachycardia, syncope or near syncope.  Past Medical History:  Diagnosis Date  . Arthritis 02-13-11   osteoarthritis, hips, knees,s/p Cervical fusion(DDD)  . Atherosclerosis of aorta ([place]) 05/30/2015   on CTA chest  . CAD (coronary artery disease)    a. 09/2016: Coronary CT showing mid-LAD plaque with 20-50% associated stenosis but no significant stenosis by FFR analysis.   . Depression with anxiety   . Diabetes mellitus without complication ([place])   . Difficult intubation 02-13-11   with surgery -multiple times, no problems with recent surgeries  . GERD (gastroesophageal reflux disease) 02-13-11   tx. Omeprazole  . Glaucoma 02-13-11   bil. tx.  eye drops daily  . Headache(784.0) 02-13-11   past hx. migraines, none recent  . Heart murmur 02-13-11   was told-no issues with this  . Hypercholesterolemia   . Hypertension 02/13/2011  . Hypothyroidism 02-13-11   tx. with Levothyroxine  . Mitral regurgitation 06/01/2015   Mild  . Sinus drainage 02-13-11   uses Zyrtec as needed  . Sleep apnea 02-13-11   Hx. sleep apnea-uses cpap since 10'12 nightly    Past Surgical History:  Procedure Laterality Date  . ABDOMINAL HYSTERECTOMY  02-13-11  . APPENDECTOMY  02-13-11   Lap. removal '92  . CERVICAL FUSION  02-13-11   '92- retained hardware  . CHOLECYSTECTOMY  02-13-11   '98-lap. galbladder removal due to stones  . GUM SURGERY  02-13-11   "small mouth"  . JOINT REPLACEMENT  02-13-11   RTHA-a yr ago in [place]  . MASS EXCISION Left 08/15/2019   Procedure: EXCISION OF SEBACEOUS CYST LEFT BREAST;  Surgeon: Georganna Skeans, MD;  Location: [facility];  Service: General;  Laterality: Left;  . TONSILLECTOMY  02-13-11   child  . TOTAL HIP ARTHROPLASTY  02/17/2011   Procedure: TOTAL HIP ARTHROPLASTY ANTERIOR APPROACH;  Surgeon: Mauri Pole, MD;  Location: [facility];  Service: Orthopedics;  Laterality: Left;  . TUBAL LIGATION  02-13-11    Current Medications: Current Meds  Medication Sig  . aspirin EC 81 MG tablet Take  1 tablet (81 mg total) by mouth daily.  . Biotin 5000 MCG CAPS Take 1 capsule (5,000 mcg total) by mouth 1 day or 1 dose.  Marland Kitchen CALCIUM-VITAMIN D PO Take 1 capsule by mouth daily. 1200-1000 calcium-vitamin d  . cetirizine (ZYRTEC) 10 MG tablet Take 10 mg by mouth daily as needed for allergies.   . DULoxetine (CYMBALTA) 30 MG capsule Take 30 mg by mouth daily.   . hydrochlorothiazide (HYDRODIURIL) 25 MG tablet Take 25 mg by mouth daily.  Marland Kitchen levothyroxine (SYNTHROID, LEVOTHROID) 137 MCG tablet Take 137 mcg by mouth daily before breakfast.  . losartan (COZAAR) 50 MG tablet Take 50 mg by mouth daily.  . Magnesium 100 MG CAPS Take 100 mg by  mouth daily.  . metFORMIN (GLUCOPHAGE-XR) 500 MG 24 hr tablet Take 500 mg by mouth daily with breakfast.  . oxyCODONE (OXY IR/ROXICODONE) 5 MG immediate release tablet Take 1 tablet (5 mg total) by mouth every 6 (six) hours as needed for severe pain.  . pantoprazole (PROTONIX) 40 MG tablet Take 1 tablet (40 mg total) by mouth 2 (two) times daily. (Patient taking differently: Take 40 mg by mouth daily. )  . rosuvastatin (CRESTOR) 40 MG tablet Take 1 tablet (40 mg total) by mouth daily.  . vitamin B-12 (CYANOCOBALAMIN) 500 MCG tablet Take 500 mcg by mouth daily. Reported on 05/16/2015  . vortioxetine HBr (TRINTELLIX) 10 MG TABS tablet Take 10 mg by mouth daily.  . [DISCONTINUED] traMADol (ULTRAM) 50 MG tablet Take by mouth every 6 (six) hours as needed.     Allergies:   Ceftin [cefuroxime axetil], Codeine, and Compazine [prochlorperazine]   Social History   Socioeconomic History  . Marital status: Married    Spouse name: Not on file  . Number of children: Not on file  . Years of education: Not on file  . Highest education level: Not on file  Occupational History  . Not on file  Tobacco Use  . Smoking status: Never Smoker  . Smokeless tobacco: Never Used  Vaping Use  . Vaping Use: Never used  Substance and Sexual Activity  . Alcohol use: Yes    Comment: rare  . Drug use: No  . Sexual activity: Yes    Birth control/protection: Surgical    Comment: hyst  Other Topics Concern  . Not on file  Social History Narrative  . Not on file   Social Determinants of Health   Financial Resource Strain:   . Difficulty of Paying Living Expenses:   Food Insecurity:   . Worried About Charity fundraiser in the Last Year:   . Arboriculturist in the Last Year:   Transportation Needs:   . Film/video editor (Medical):   Marland Kitchen Lack of Transportation (Non-Medical):   Physical Activity:   . Days of Exercise per Week:   . Minutes of Exercise per Session:   Stress:   . Feeling of Stress :    Social Connections:   . Frequency of Communication with Friends and Family:   . Frequency of Social Gatherings with Friends and Family:   . Attends Religious Services:   . Active Member of Clubs or Organizations:   . Attends Archivist Meetings:   Marland Kitchen Marital Status:      Family History: The patient's family history includes Breast cancer in her maternal aunt; Cancer in her mother.  ROS:   Please see the history of present illness.    Sebaceous cyst removed yesterday  Rt knee and Rt foot problems  All other systems reviewed and are negative.  EKGs/Labs/Other Studies Reviewed:    The following studies were reviewed today: Coronary CTA- Sept 2018  EKG:  EKG is ordered today.  The ekg ordered today demonstrates NSR, 80, RBBB  Recent Labs: 08/11/2019: BUN 15; Creatinine, Ser 0.92; Potassium 4.5; Sodium 139  Recent Lipid Panel    Component Value Date/Time   CHOL 170 10/08/2016 0413   TRIG 105 10/08/2016 0413   HDL 63 10/08/2016 0413   CHOLHDL 2.7 10/08/2016 0413   VLDL 21 10/08/2016 0413   LDLCALC 86 10/08/2016 0413    Physical Exam:    VS:  BP 118/72 (BP Location: Left Arm, Patient Position: Sitting, Cuff Size: Large)   Pulse 80   Ht 5\' 2"  (1.575 m)   Wt 231 lb 6.4 oz (105 kg)   BMI 42.32 kg/m     Wt Readings from Last 3 Encounters:  08/17/19 231 lb 6.4 oz (105 kg)  08/15/19 228 lb 2.8 oz (103.5 kg)  08/18/17 230 lb (104.3 kg)     GEN: Obese, well developed, Caucasian female, in no acute distress HEENT: Normal NECK: No JVD; No carotid bruits CARDIAC: RRR, no murmurs, rubs, gallops RESPIRATORY:  Clear to auscultation without rales, wheezing or rhonchi  ABDOMEN: Obese, soft,  non-distended MUSCULOSKELETAL:  No edema; No deformity  SKIN: Warm and dry NEUROLOGIC:  Alert and oriented x 3 PSYCHIATRIC:  Normal affect   ASSESSMENT:    Palpitations Try PRN Metoprolol. Discussed stress reduction and exercise (consider stationary bike as she has knee and  foot issues)  Hypertension Controlled  Hyperlipidemia LDL goal <70 Statin rx per PCP  Morbid obesity ([place]) BMI 42  Sleep apnea Compliant with C-pap  Depression with anxiety Keep f/u with your psychiatrist   PLAN:    Metoprolol PRN- f/u 6 months   Medication Adjustments/Labs and Tests Ordered: Current medicines are reviewed at length with the patient today.  Concerns regarding medicines are outlined above.  Orders Placed This Encounter  Procedures  . EKG 12-Lead   Meds ordered this encounter  Medications  . metoprolol tartrate (LOPRESSOR) 25 MG tablet    Sig: Take 0.5-1 tablets (12.5-25 mg total) by mouth in the morning and at bedtime. (Palpitations)    Dispense:  180 tablet    Refill:  1    Patient Instructions  Medication Instructions:   START Metoprolol 25 mg----take 1/2-1 tablet (12.5mg -25mg ) by mouth twice daily.  *If you need a refill on your cardiac medications before your next appointment, please call your pharmacy*   Follow-Up: At [facility], you and your health needs are our priority.  As part of our continuing mission to provide you with exceptional heart care, we have created designated Provider Care Teams.  These Care Teams include your primary Cardiologist (physician) and Advanced Practice Providers (APPs -  Physician Assistants and Nurse Practitioners) who all work together to provide you with the care you need, when you need it.  We recommend signing up for the patient portal called "MyChart".  Sign up information is provided on this After Visit Summary.  MyChart is used to connect with patients for Virtual Visits (Telemedicine).  Patients are able to view lab/test results, encounter notes, upcoming appointments, etc.  Non-urgent messages can be sent to your provider as well.   To learn more about what you can do with MyChart, go to [email or web address].    Your next appointment:   6 month(s)  The  format for your next appointment:   In  Person  Provider:   You may see Skeet Latch, MD or one of the following Advanced Practice Providers on your designated Care Team:    Kerin Ransom, PA-C  [place], [place]  Coletta Memos, FNP        Signed, Kerin Ransom, [place]  08/17/2019 10:49 AM    [place]

## 2019-08-18 DIAGNOSIS — H401131 Primary open-angle glaucoma, bilateral, mild stage: Secondary | ICD-10-CM | POA: Diagnosis not present

## 2019-08-18 DIAGNOSIS — H04123 Dry eye syndrome of bilateral lacrimal glands: Secondary | ICD-10-CM | POA: Diagnosis not present

## 2019-08-18 DIAGNOSIS — H26493 Other secondary cataract, bilateral: Secondary | ICD-10-CM | POA: Diagnosis not present

## 2019-08-18 DIAGNOSIS — E119 Type 2 diabetes mellitus without complications: Secondary | ICD-10-CM | POA: Diagnosis not present

## 2019-08-22 DIAGNOSIS — F332 Major depressive disorder, recurrent severe without psychotic features: Secondary | ICD-10-CM | POA: Diagnosis not present

## 2019-08-28 DIAGNOSIS — F332 Major depressive disorder, recurrent severe without psychotic features: Secondary | ICD-10-CM | POA: Diagnosis not present

## 2019-09-08 DIAGNOSIS — F332 Major depressive disorder, recurrent severe without psychotic features: Secondary | ICD-10-CM | POA: Diagnosis not present

## 2019-09-13 DIAGNOSIS — G4733 Obstructive sleep apnea (adult) (pediatric): Secondary | ICD-10-CM | POA: Diagnosis not present

## 2019-09-15 DIAGNOSIS — F332 Major depressive disorder, recurrent severe without psychotic features: Secondary | ICD-10-CM | POA: Diagnosis not present

## 2019-09-22 DIAGNOSIS — F332 Major depressive disorder, recurrent severe without psychotic features: Secondary | ICD-10-CM | POA: Diagnosis not present

## 2019-09-26 DIAGNOSIS — H04123 Dry eye syndrome of bilateral lacrimal glands: Secondary | ICD-10-CM | POA: Diagnosis not present

## 2019-09-26 DIAGNOSIS — H524 Presbyopia: Secondary | ICD-10-CM | POA: Diagnosis not present

## 2019-09-26 DIAGNOSIS — H401131 Primary open-angle glaucoma, bilateral, mild stage: Secondary | ICD-10-CM | POA: Diagnosis not present

## 2019-09-29 DIAGNOSIS — F332 Major depressive disorder, recurrent severe without psychotic features: Secondary | ICD-10-CM | POA: Diagnosis not present

## 2019-10-02 DIAGNOSIS — F332 Major depressive disorder, recurrent severe without psychotic features: Secondary | ICD-10-CM | POA: Diagnosis not present

## 2019-10-05 DIAGNOSIS — Z0100 Encounter for examination of eyes and vision without abnormal findings: Secondary | ICD-10-CM | POA: Diagnosis not present

## 2019-10-10 DIAGNOSIS — F332 Major depressive disorder, recurrent severe without psychotic features: Secondary | ICD-10-CM | POA: Diagnosis not present

## 2019-10-20 DIAGNOSIS — F332 Major depressive disorder, recurrent severe without psychotic features: Secondary | ICD-10-CM | POA: Diagnosis not present

## 2019-10-24 ENCOUNTER — Telehealth: Payer: Self-pay

## 2019-10-24 NOTE — Telephone Encounter (Signed)
Patient was returning phone call, said she missed call from our number.

## 2019-10-27 ENCOUNTER — Other Ambulatory Visit

## 2019-10-27 ENCOUNTER — Encounter

## 2019-11-01 DIAGNOSIS — F332 Major depressive disorder, recurrent severe without psychotic features: Secondary | ICD-10-CM | POA: Diagnosis not present

## 2019-11-08 DIAGNOSIS — G4733 Obstructive sleep apnea (adult) (pediatric): Secondary | ICD-10-CM | POA: Diagnosis not present

## 2019-11-08 DIAGNOSIS — G4752 REM sleep behavior disorder: Secondary | ICD-10-CM | POA: Diagnosis not present

## 2019-11-14 ENCOUNTER — Other Ambulatory Visit: Payer: Self-pay

## 2019-11-14 ENCOUNTER — Ambulatory Visit
Admission: RE | Admit: 2019-11-14 | Discharge: 2019-11-14 | Disposition: A | Discharge status: Home / Self Care | Payer: Medicare HMO | Source: Ambulatory Visit | Attending: Internal Medicine | Admitting: Internal Medicine

## 2019-11-14 ENCOUNTER — Ambulatory Visit
Admission: RE | Admit: 2019-11-14 | Discharge: 2019-11-14 | Disposition: A | Discharge status: Home / Self Care | Source: Ambulatory Visit | Attending: Internal Medicine | Admitting: Internal Medicine

## 2019-11-14 DIAGNOSIS — N6314 Unspecified lump in the right breast, lower inner quadrant: Secondary | ICD-10-CM | POA: Diagnosis not present

## 2019-11-14 DIAGNOSIS — N63 Unspecified lump in unspecified breast: Secondary | ICD-10-CM

## 2019-11-14 DIAGNOSIS — R928 Other abnormal and inconclusive findings on diagnostic imaging of breast: Secondary | ICD-10-CM | POA: Diagnosis not present

## 2019-11-22 DIAGNOSIS — E039 Hypothyroidism, unspecified: Secondary | ICD-10-CM | POA: Diagnosis not present

## 2019-11-22 DIAGNOSIS — I10 Essential (primary) hypertension: Secondary | ICD-10-CM | POA: Diagnosis not present

## 2019-11-22 DIAGNOSIS — E1169 Type 2 diabetes mellitus with other specified complication: Secondary | ICD-10-CM | POA: Diagnosis not present

## 2019-11-22 DIAGNOSIS — Z23 Encounter for immunization: Secondary | ICD-10-CM | POA: Diagnosis not present

## 2019-11-23 DIAGNOSIS — F332 Major depressive disorder, recurrent severe without psychotic features: Secondary | ICD-10-CM | POA: Diagnosis not present

## 2019-12-14 ENCOUNTER — Other Ambulatory Visit: Payer: Self-pay

## 2019-12-14 MED ORDER — METOPROLOL TARTRATE 25 MG PO TABS: 12.5000 mg | ORAL_TABLET | Freq: Two times a day (BID) | ORAL | 1 refills | Status: DC

## 2019-12-16 DIAGNOSIS — F331 Major depressive disorder, recurrent, moderate: Secondary | ICD-10-CM | POA: Diagnosis not present

## 2019-12-29 DIAGNOSIS — G4733 Obstructive sleep apnea (adult) (pediatric): Secondary | ICD-10-CM | POA: Diagnosis not present

## 2020-01-09 DIAGNOSIS — H66001 Acute suppurative otitis media without spontaneous rupture of ear drum, right ear: Secondary | ICD-10-CM | POA: Diagnosis not present

## 2020-01-09 DIAGNOSIS — Z03818 Encounter for observation for suspected exposure to other biological agents ruled out: Secondary | ICD-10-CM | POA: Diagnosis not present

## 2020-01-09 DIAGNOSIS — Z1152 Encounter for screening for COVID-19: Secondary | ICD-10-CM | POA: Diagnosis not present

## 2020-01-16 DIAGNOSIS — F331 Major depressive disorder, recurrent, moderate: Secondary | ICD-10-CM | POA: Diagnosis not present

## 2020-01-31 ENCOUNTER — Telehealth: Payer: Self-pay | Admitting: Cardiovascular Disease

## 2020-01-31 NOTE — Telephone Encounter (Signed)
*  STAT* If patient is at the pharmacy, call can be transferred to refill team.   1. Which medications need to be refilled? (please list name of each medication and dose if known) metoprolol tartrate (LOPRESSOR) 25 MG tablet  2. Which pharmacy/location (including street and city if local pharmacy) is medication to be sent to? [place], [place]  3. Do they need a 30 day or 90 day supply? 90 day

## 2020-02-01 MED ORDER — METOPROLOL TARTRATE 25 MG PO TABS: 12.5000 mg | ORAL_TABLET | Freq: Two times a day (BID) | ORAL | 1 refills | Status: DC

## 2020-02-01 NOTE — Telephone Encounter (Signed)
Refill sent to pt's pharmacy. 

## 2020-02-21 DIAGNOSIS — F332 Major depressive disorder, recurrent severe without psychotic features: Secondary | ICD-10-CM | POA: Diagnosis not present

## 2020-03-04 DIAGNOSIS — F331 Major depressive disorder, recurrent, moderate: Secondary | ICD-10-CM | POA: Diagnosis not present

## 2020-03-26 DIAGNOSIS — F331 Major depressive disorder, recurrent, moderate: Secondary | ICD-10-CM | POA: Diagnosis not present

## 2020-04-04 DIAGNOSIS — G4733 Obstructive sleep apnea (adult) (pediatric): Secondary | ICD-10-CM | POA: Diagnosis not present

## 2020-04-10 ENCOUNTER — Telehealth: Payer: Self-pay | Admitting: Cardiovascular Disease

## 2020-04-10 NOTE — Telephone Encounter (Signed)
Spoke to patient, she states her metoprolol has her old last name on it and wanted to make [place] aware of her name change.    Advised this was sent in Jan 2021 but name is updated and anything sent in the future will be correct.  Patient verbalized understanding.

## 2020-04-10 NOTE — Telephone Encounter (Signed)
Patient called in to say that her medication is under her maiden name and it needs to be under her married name which is [place]. Patient is asking that this be corrected if possible

## 2020-04-16 DIAGNOSIS — F331 Major depressive disorder, recurrent, moderate: Secondary | ICD-10-CM | POA: Diagnosis not present

## 2020-04-30 DIAGNOSIS — H401131 Primary open-angle glaucoma, bilateral, mild stage: Secondary | ICD-10-CM | POA: Diagnosis not present

## 2020-04-30 DIAGNOSIS — H04123 Dry eye syndrome of bilateral lacrimal glands: Secondary | ICD-10-CM | POA: Diagnosis not present

## 2020-04-30 DIAGNOSIS — H26493 Other secondary cataract, bilateral: Secondary | ICD-10-CM | POA: Diagnosis not present

## 2020-04-30 DIAGNOSIS — E119 Type 2 diabetes mellitus without complications: Secondary | ICD-10-CM | POA: Diagnosis not present

## 2020-05-02 DIAGNOSIS — F331 Major depressive disorder, recurrent, moderate: Secondary | ICD-10-CM | POA: Diagnosis not present

## 2020-05-08 DIAGNOSIS — G4733 Obstructive sleep apnea (adult) (pediatric): Secondary | ICD-10-CM | POA: Diagnosis not present

## 2020-05-08 DIAGNOSIS — F3341 Major depressive disorder, recurrent, in partial remission: Secondary | ICD-10-CM | POA: Diagnosis not present

## 2020-05-08 DIAGNOSIS — G4752 REM sleep behavior disorder: Secondary | ICD-10-CM | POA: Diagnosis not present

## 2020-05-16 DIAGNOSIS — F331 Major depressive disorder, recurrent, moderate: Secondary | ICD-10-CM | POA: Diagnosis not present

## 2020-05-30 DIAGNOSIS — F331 Major depressive disorder, recurrent, moderate: Secondary | ICD-10-CM | POA: Diagnosis not present

## 2020-06-26 ENCOUNTER — Other Ambulatory Visit: Payer: Self-pay | Admitting: Cardiovascular Disease

## 2020-07-23 DIAGNOSIS — F331 Major depressive disorder, recurrent, moderate: Secondary | ICD-10-CM | POA: Diagnosis not present

## 2020-07-30 DIAGNOSIS — H04123 Dry eye syndrome of bilateral lacrimal glands: Secondary | ICD-10-CM | POA: Diagnosis not present

## 2020-07-30 DIAGNOSIS — H401131 Primary open-angle glaucoma, bilateral, mild stage: Secondary | ICD-10-CM | POA: Diagnosis not present

## 2020-08-20 DIAGNOSIS — F331 Major depressive disorder, recurrent, moderate: Secondary | ICD-10-CM | POA: Diagnosis not present

## 2020-08-23 DIAGNOSIS — L299 Pruritus, unspecified: Secondary | ICD-10-CM | POA: Diagnosis not present

## 2020-09-05 DIAGNOSIS — F410 Panic disorder [episodic paroxysmal anxiety] without agoraphobia: Secondary | ICD-10-CM | POA: Diagnosis not present

## 2020-09-05 DIAGNOSIS — R419 Unspecified symptoms and signs involving cognitive functions and awareness: Secondary | ICD-10-CM | POA: Diagnosis not present

## 2020-09-05 DIAGNOSIS — F331 Major depressive disorder, recurrent, moderate: Secondary | ICD-10-CM | POA: Diagnosis not present

## 2020-09-08 DIAGNOSIS — S60221A Contusion of right hand, initial encounter: Secondary | ICD-10-CM | POA: Diagnosis not present

## 2020-09-13 DIAGNOSIS — Z0189 Encounter for other specified special examinations: Secondary | ICD-10-CM | POA: Diagnosis not present

## 2020-09-13 DIAGNOSIS — I10 Essential (primary) hypertension: Secondary | ICD-10-CM | POA: Diagnosis not present

## 2020-09-13 DIAGNOSIS — Z79899 Other long term (current) drug therapy: Secondary | ICD-10-CM | POA: Diagnosis not present

## 2020-09-20 DIAGNOSIS — F331 Major depressive disorder, recurrent, moderate: Secondary | ICD-10-CM | POA: Diagnosis not present

## 2020-10-30 DIAGNOSIS — F331 Major depressive disorder, recurrent, moderate: Secondary | ICD-10-CM | POA: Diagnosis not present

## 2020-10-30 DIAGNOSIS — F410 Panic disorder [episodic paroxysmal anxiety] without agoraphobia: Secondary | ICD-10-CM | POA: Diagnosis not present

## 2020-11-01 DIAGNOSIS — M25552 Pain in left hip: Secondary | ICD-10-CM | POA: Diagnosis not present

## 2020-11-01 DIAGNOSIS — M25561 Pain in right knee: Secondary | ICD-10-CM | POA: Diagnosis not present

## 2020-11-01 DIAGNOSIS — Z96643 Presence of artificial hip joint, bilateral: Secondary | ICD-10-CM | POA: Diagnosis not present

## 2020-11-01 DIAGNOSIS — M1711 Unilateral primary osteoarthritis, right knee: Secondary | ICD-10-CM | POA: Diagnosis not present

## 2020-11-06 DIAGNOSIS — G4752 REM sleep behavior disorder: Secondary | ICD-10-CM | POA: Diagnosis not present

## 2020-11-06 DIAGNOSIS — G4733 Obstructive sleep apnea (adult) (pediatric): Secondary | ICD-10-CM | POA: Diagnosis not present

## 2020-11-12 DIAGNOSIS — Z0000 Encounter for general adult medical examination without abnormal findings: Secondary | ICD-10-CM | POA: Diagnosis not present

## 2020-11-12 DIAGNOSIS — E1169 Type 2 diabetes mellitus with other specified complication: Secondary | ICD-10-CM | POA: Diagnosis not present

## 2020-11-12 DIAGNOSIS — E039 Hypothyroidism, unspecified: Secondary | ICD-10-CM | POA: Diagnosis not present

## 2020-11-12 DIAGNOSIS — T50995S Adverse effect of other drugs, medicaments and biological substances, sequela: Secondary | ICD-10-CM | POA: Diagnosis not present

## 2020-11-12 DIAGNOSIS — I10 Essential (primary) hypertension: Secondary | ICD-10-CM | POA: Diagnosis not present

## 2020-11-12 DIAGNOSIS — R413 Other amnesia: Secondary | ICD-10-CM | POA: Diagnosis not present

## 2020-11-12 DIAGNOSIS — Z7984 Long term (current) use of oral hypoglycemic drugs: Secondary | ICD-10-CM | POA: Diagnosis not present

## 2020-11-12 DIAGNOSIS — I951 Orthostatic hypotension: Secondary | ICD-10-CM | POA: Diagnosis not present

## 2020-11-12 DIAGNOSIS — Z7189 Other specified counseling: Secondary | ICD-10-CM | POA: Diagnosis not present

## 2020-11-12 DIAGNOSIS — E559 Vitamin D deficiency, unspecified: Secondary | ICD-10-CM | POA: Diagnosis not present

## 2020-11-12 DIAGNOSIS — F3341 Major depressive disorder, recurrent, in partial remission: Secondary | ICD-10-CM | POA: Diagnosis not present

## 2020-11-13 DIAGNOSIS — M6281 Muscle weakness (generalized): Secondary | ICD-10-CM | POA: Diagnosis not present

## 2020-11-13 DIAGNOSIS — R269 Unspecified abnormalities of gait and mobility: Secondary | ICD-10-CM | POA: Diagnosis not present

## 2020-11-15 DIAGNOSIS — R269 Unspecified abnormalities of gait and mobility: Secondary | ICD-10-CM | POA: Diagnosis not present

## 2020-11-15 DIAGNOSIS — M6281 Muscle weakness (generalized): Secondary | ICD-10-CM | POA: Diagnosis not present

## 2020-11-18 DIAGNOSIS — M6281 Muscle weakness (generalized): Secondary | ICD-10-CM | POA: Diagnosis not present

## 2020-11-18 DIAGNOSIS — R269 Unspecified abnormalities of gait and mobility: Secondary | ICD-10-CM | POA: Diagnosis not present

## 2020-11-20 ENCOUNTER — Other Ambulatory Visit: Payer: Self-pay | Admitting: Cardiovascular Disease

## 2020-11-20 DIAGNOSIS — M6281 Muscle weakness (generalized): Secondary | ICD-10-CM | POA: Diagnosis not present

## 2020-11-20 DIAGNOSIS — R269 Unspecified abnormalities of gait and mobility: Secondary | ICD-10-CM | POA: Diagnosis not present

## 2020-12-02 DIAGNOSIS — M6281 Muscle weakness (generalized): Secondary | ICD-10-CM | POA: Diagnosis not present

## 2020-12-02 DIAGNOSIS — R269 Unspecified abnormalities of gait and mobility: Secondary | ICD-10-CM | POA: Diagnosis not present

## 2020-12-09 DIAGNOSIS — M6281 Muscle weakness (generalized): Secondary | ICD-10-CM | POA: Diagnosis not present

## 2020-12-09 DIAGNOSIS — R269 Unspecified abnormalities of gait and mobility: Secondary | ICD-10-CM | POA: Diagnosis not present

## 2020-12-11 DIAGNOSIS — R269 Unspecified abnormalities of gait and mobility: Secondary | ICD-10-CM | POA: Diagnosis not present

## 2020-12-11 DIAGNOSIS — M6281 Muscle weakness (generalized): Secondary | ICD-10-CM | POA: Diagnosis not present

## 2020-12-18 ENCOUNTER — Other Ambulatory Visit: Payer: Self-pay | Admitting: Neurology

## 2020-12-18 ENCOUNTER — Ambulatory Visit (INDEPENDENT_AMBULATORY_CARE_PROVIDER_SITE_OTHER): Payer: Medicare HMO | Admitting: Neurology

## 2020-12-18 ENCOUNTER — Encounter: Payer: Self-pay | Admitting: Neurology

## 2020-12-18 VITALS — BP 118/74 | HR 85 | Ht 62.5 in | Wt 217.2 lb

## 2020-12-18 DIAGNOSIS — R419 Unspecified symptoms and signs involving cognitive functions and awareness: Secondary | ICD-10-CM | POA: Diagnosis not present

## 2020-12-18 DIAGNOSIS — R4189 Other symptoms and signs involving cognitive functions and awareness: Secondary | ICD-10-CM

## 2020-12-18 DIAGNOSIS — G309 Alzheimer's disease, unspecified: Secondary | ICD-10-CM

## 2020-12-18 DIAGNOSIS — E559 Vitamin D deficiency, unspecified: Secondary | ICD-10-CM

## 2020-12-18 DIAGNOSIS — R413 Other amnesia: Secondary | ICD-10-CM

## 2020-12-18 DIAGNOSIS — Z113 Encounter for screening for infections with a predominantly sexual mode of transmission: Secondary | ICD-10-CM | POA: Diagnosis not present

## 2020-12-18 DIAGNOSIS — E538 Deficiency of other specified B group vitamins: Secondary | ICD-10-CM

## 2020-12-18 DIAGNOSIS — R5383 Other fatigue: Secondary | ICD-10-CM | POA: Diagnosis not present

## 2020-12-18 MED ORDER — ALPRAZOLAM 0.25 MG PO TABS: ORAL_TABLET | ORAL | 0 refills | Status: DC

## 2020-12-18 NOTE — Progress Notes (Signed)
[facility]    Provider:  Dr Jaynee Eagles Requesting Provider: [name], NP Primary Care Provider:  [name], MD  CC:  Memory loss    HPI:  Danielle [name] is a 75 y.o. female here as requested by [name], NP for memory loss. PMHx arthritis, aortic atherosclerosis, CAD, depression with anxiety, headache, heart murmur, hld, htn,   Started within the last 1.5-2 years. Started with not remembering words or people's names. Slowly progressive. Husband accompanies and provides much information. Worsening last 6 months more progressive, it upsets her so much, she doesn't like driving because she gets lost on roads she knows or misses her turns, she will ask the same questions in the same day, sometimes she needs to stop and tell people she doesn't remember, she gets anxious when she is trying to do things like paperwork, finances, organization is hard, she takes her own medications but sometimes worries if she takes them correctly, less social. She is compliant with her cpap, husband provides a lot of info, he sees a lot of depression, she has nightmares that she acts out, husband says she yells out and thrashes. She denies hallucinations or delusions. Appears she performs her own IADLs and ADLs, and husband says she may "zone out". Husband thinks it is a lot of depression, she gets frustrated with these episodes, she is compliant with psychiatry. She is less social. A few of her maternal aunts maybe had memory loss/dementia but her mother died in her 76s without problems. She gets frustrated and angry when these things happen. No other focal neurologic deficits, associated symptoms, inciting events or modifiable factors.   Reviewed notes, labs and imaging from outside physicians, which showed:  Ct head 2012: reviewed images and agree: CT HEAD WITHOUT CONTRAST   Technique:  Contiguous axial images were obtained from the base of  the skull through the vertex  without contrast.   Comparison: None.   Findings: No CT evidence of intracranial hemorrhage.   No CT evidence of large acute infarct.  Small acute infarct cannot  be excluded by CT.   No intracranial mass lesion detected on this unenhanced exam.   No hydrocephalus.   No obvious intracranial aneurysm although evaluation by unenhanced  head CT is limited.  If this is of high clinical concern then MR  angiogram of the circle Willis can be obtained as a screening  evaluation for detection of such.   Visualized sinuses and mastoid air cells clear.  No calvarial  destructive lesion.   IMPRESSION:  No acute abnormality.  Please see above. (Per my read also some chronic microvascular changes)  Review of Systems: Patient complains of symptoms per HPI as well as the following symptoms obesity. Pertinent negatives and positives per HPI. All others negative.   Social History   Socioeconomic History   Marital status: Married    Spouse name: Not on file   Number of children: Not on file   Years of education: Not on file   Highest education level: Not on file  Occupational History   Not on file  Tobacco Use   Smoking status: Never   Smokeless tobacco: Never  Vaping Use   Vaping Use: Never used  Substance and Sexual Activity   Alcohol use: Yes    Comment: rare   Drug use: No   Sexual activity: Yes    Birth control/protection: Surgical    Comment: hyst  Other Topics Concern   Not on file  Social History  Narrative   Caffeine le-ss then once cup daily. Education: HS diploma.  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     Social Determinants of Health   Financial Resource Strain: Not on file  Food Insecurity: Not on file  Transportation Needs: Not on file  Physical Activity: Not on file  Stress: Not on file  Social Connections: Not on file  Intimate Partner Violence: Not on file    Family History  Problem Relation Age of Onset   Cancer Mother         lung   Breast cancer Maternal Aunt     Past Medical History:  Diagnosis Date   Arthritis 02-13-11   osteoarthritis, hips, knees,s/p Cervical fusion(DDD)   Atherosclerosis of aorta ([place]) 05/30/2015   on CTA chest   CAD (coronary artery disease)    a. 09/2016: Coronary CT showing mid-LAD plaque with 20-50% associated stenosis but no significant stenosis by FFR analysis.    Depression with anxiety    Diabetes mellitus without complication ([place])    Difficult intubation 02-13-11   with surgery -multiple times, no problems with recent surgeries   GERD (gastroesophageal reflux disease) 02-13-11   tx. Omeprazole   Glaucoma 02-13-11   bil. tx. eye drops daily   Headache(784.0) 02-13-11   past hx. migraines, none recent   Heart murmur 02-13-11   was told-no issues with this   Hypercholesterolemia    Hypertension 02/13/2011   Hypothyroidism 02-13-11   tx. with Levothyroxine   Mitral regurgitation 06/01/2015   Mild   Sinus drainage 02-13-11   uses Zyrtec as needed   Sleep apnea 02-13-11   Hx. sleep apnea-uses cpap since 10'12 nightly    Patient Active Problem List   Diagnosis Date Noted   Memory loss 12/22/2020   Alteration of awareness 12/22/2020   Palpitations 08/17/2019   Calcification of native coronary artery 10/20/2016   Morbid obesity ([place])    Chest pain 10/07/2016   Hyperglycemia 10/07/2016   Acute respiratory failure with hypoxia ([place]) 05/30/2015   Atypical pneumonia 05/30/2015   Dyspnea 05/30/2015   Hyperlipidemia LDL goal <70    Gastroesophageal reflux disease without esophagitis    Depression with anxiety    Tibial plateau fracture 05/16/2015   Closed tibial fracture 05/16/2015   Sebaceous cyst of breast 07/27/2013   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   Sleep apnea 02/13/2011   Hypertension 02/13/2011   GERD (gastroesophageal reflux disease) 02/13/2011   Hypothyroidism 02/13/2011    Past Surgical History:  Procedure Laterality Date   ABDOMINAL HYSTERECTOMY  02-13-11    APPENDECTOMY  02-13-11   Lap. removal '92   CERVICAL FUSION  02-13-11   '92- retained hardware   CHOLECYSTECTOMY  02-13-11   '98-lap. galbladder removal due to stones   GUM SURGERY  02-13-11   "small mouth"   JOINT REPLACEMENT  02-13-11   RTHA-a yr ago in [place] Left 08/15/2019   Procedure: EXCISION OF SEBACEOUS CYST LEFT BREAST;  Surgeon: Georganna Skeans, MD;  Location: [facility];  Service: General;  Laterality: Left;   TONSILLECTOMY  02-13-11   child   TOTAL HIP ARTHROPLASTY  02/17/2011   Procedure: TOTAL HIP ARTHROPLASTY ANTERIOR APPROACH;  Surgeon: Mauri Pole, MD;  [facility];  Service: Orthopedics;  Laterality: Left;   TUBAL LIGATION  02-13-11    Current Outpatient Medications  Medication Sig Dispense Refill   ALPRAZolam (XANAX) 0.25 MG tablet Take 0.25 mg by mouth  daily as needed.     ALPRAZolam (XANAX) 0.25 MG tablet Take 1-2 tabs (0.25mg -0.50mg ) 30-60 minutes before procedure. May repeat if needed.Do not drive. 4 tablet 0   aspirin EC 81 MG tablet Take 1 tablet (81 mg total) by mouth daily.     Biotin 5000 MCG CAPS Take 1 capsule (5,000 mcg total) by mouth 1 day or 1 dose. 30 capsule 0   CALCIUM-VITAMIN D PO Take 1 capsule by mouth daily. 1200-1000 calcium-vitamin d     cetirizine (ZYRTEC) 10 MG tablet Take 10 mg by mouth daily as needed for allergies.      desvenlafaxine (PRISTIQ) 100 MG 24 hr tablet Take 100 mg by mouth daily.     desvenlafaxine (PRISTIQ) 50 MG 24 hr tablet Take 50 mg by mouth daily. Takes with 100mg  tablet (total 150mg  daily)     hydrochlorothiazide (HYDRODIURIL) 25 MG tablet Take 25 mg by mouth daily.     levothyroxine (SYNTHROID, LEVOTHROID) 137 MCG tablet Take 137 mcg by mouth daily before breakfast.     losartan (COZAAR) 50 MG tablet Take 50 mg by mouth daily.     Magnesium 100 MG CAPS Take 100 mg by mouth daily.     metFORMIN (GLUCOPHAGE-XR) 500 MG 24 hr tablet Take 500 mg by mouth daily with breakfast.     metoprolol  tartrate (LOPRESSOR) 25 MG tablet TAKE 1/2 TO 1 TABLET IN THE MORNING AND AT BEDTIME. (FOR PALPITATIONS) NEEDS APPOINTMENT FOR FURTHER REFILLS 60 tablet 0   oxyCODONE (OXY IR/ROXICODONE) 5 MG immediate release tablet Take 1 tablet (5 mg total) by mouth every 6 (six) hours as needed for severe pain. 10 tablet 0   pantoprazole (PROTONIX) 40 MG tablet Take 1 tablet (40 mg total) by mouth 2 (two) times daily. (Patient taking differently: Take 40 mg by mouth daily.) 60 tablet 0   rosuvastatin (CRESTOR) 40 MG tablet Take 1 tablet (40 mg total) by mouth daily. 90 tablet 3   No current facility-administered medications for this visit.    Allergies as of 12/18/2020 - Review Complete 12/18/2020  Allergen Reaction Noted   Ceftin [cefuroxime axetil] Hives and Swelling 02/13/2011   Codeine Nausea Only 12/05/2010   Compazine [prochlorperazine] Swelling and Other (See Comments) 12/05/2010    Vitals: BP 118/74   Pulse 85   Ht 5' 2.5" (1.588 m)   Wt 217 lb 3.2 oz (98.5 kg)   BMI 39.09 kg/m  Last Weight:  Wt Readings from Last 1 Encounters:  12/18/20 217 lb 3.2 oz (98.5 kg)   Last Height:   Ht Readings from Last 1 Encounters:  12/18/20 5' 2.5" (1.588 m)     Physical exam: Exam: Gen: NAD, conversant, well nourised, obese, well groomed                     CV: RRR, no MRG. No Carotid Bruits. No peripheral edema, warm, nontender Eyes: Conjunctivae clear without exudates or hemorrhage  Neuro: Detailed Neurologic Exam  Speech:    Speech is normal; fluent and spontaneous with normal comprehension.  Cognition:  MMSE - Mini Mental State Exam 12/18/2020  Orientation to time 5  Orientation to Place 5  Registration 3  Attention/ Calculation 5  Recall 3  Language- name 2 objects 2  Language- repeat 1  Language- follow 3 step command 3  Language- read & follow direction 1  Write a sentence 1  Copy design 0  Total score 29       The  patient is oriented to person, place, and time;      recent and remote memory intact;     language fluent;     normal attention, concentration,     fund of knowledge Cranial Nerves:    The pupils are equal, round, and reactive to light. Pupils too small to visualize fundi. Visual fields are full to finger confrontation. Extraocular movements are intact. Trigeminal sensation is intact and the muscles of mastication are normal. The face is symmetric. The palate elevates in the midline. Hearing intact. Voice is normal. Shoulder shrug is normal. The tongue has normal motion without fasciculations.   Coordination:    Normal   Gait:    antalgic  Motor Observation:    No asymmetry, no atrophy, and no involuntary movements noted. Tone:    Normal muscle tone.    Posture:    Posture is normal. normal erect    Strength:    Strength is V/V in the upper and lower limbs.      Sensation: intact to LT     Reflex Exam:  DTR's:    Deep tendon reflexes in the upper and lower extremities are symmetrical bilaterally.   Toes:    The toes are equiv bilaterally.   Clonus:    Clonus is absent.    Assessment/Plan:  Patient here with complaints of cognitive decline/memory loss. MMSE 29/30.   - MRI of the brain and blood work for reversible causes of dementia  - Memory testing: neurcognitive formal  - REM Sleep Disorder?: She is compliant with her cpap, husband provides a lot of info, he sees a lot of depression, she has nightmares that she acts out, husband says she yells out and thrashes. She sees Dr. Isidoro Donning, discuss with Dr. Isidoro Donning if a repeat sleep study may help to figure out if this is REM sleep disorder.  - husband says she may "zone out": EEG  Orders Placed This Encounter  Procedures   MR BRAIN W WO CONTRAST   CBC with Differential/Platelets   Comprehensive metabolic panel   TSH   P71 and Folate Panel   Methylmalonic acid, serum   Vitamin B1   Vitamin D, 25-hydroxy   Homocysteine   RPR   Ammonia   Sedimentation rate    Ambulatory referral to Neuropsychology   EEG adult   Meds ordered this encounter  Medications   ALPRAZolam (XANAX) 0.25 MG tablet    Sig: Take 1-2 tabs (0.25mg -0.50mg ) 30-60 minutes before procedure. May repeat if needed.Do not drive.    Dispense:  4 tablet    Refill:  0    Cc: [name], NP,  [name], MD  Sarina Ill, MD  [facility] [address] [place], [place] [address]  Phone [phone] Fax [phone]

## 2020-12-18 NOTE — Patient Instructions (Signed)
MRI brain Blood work Formal extensive Public house manager Strategies  Use "WARM" strategy.  W= write it down  A= associate it  R= repeat it  M= make a mental note  2.   You can keep a Social worker.  Use a 3-ring notebook with sections for the following: calendar, important names and phone numbers,  medications, doctors' names/phone numbers, lists/reminders, and a section to journal what you did  each day.   3.    Use a calendar to write appointments down.  4.    Write yourself a schedule for the day.  This can be placed on the calendar or in a separate section of the Memory Notebook.  Keeping a  regular schedule can help memory.  5.    Use medication organizer with sections for each day or morning/evening pills.  You may need help loading it  6.    Keep a basket, or pegboard by the door.  Place items that you need to take out with you in the basket or on the pegboard.  You may also want to  include a message board for reminders.  7.    Use sticky notes.  Place sticky notes with reminders in a place where the task is performed.  For example: " turn off the  stove" placed by the stove, "lock the door" placed on the door at eye level, " take your medications" on  the bathroom mirror or by the place where you normally take your medications.  8.    Use alarms/timers.  Use while cooking to remind yourself to check on food or as a reminder to take your medicine, or as a  reminder to make a call, or as a reminder to perform another task, etc.

## 2020-12-20 ENCOUNTER — Encounter: Payer: Self-pay | Admitting: Psychology

## 2020-12-20 DIAGNOSIS — F3341 Major depressive disorder, recurrent, in partial remission: Secondary | ICD-10-CM | POA: Diagnosis not present

## 2020-12-20 DIAGNOSIS — R202 Paresthesia of skin: Secondary | ICD-10-CM | POA: Diagnosis not present

## 2020-12-20 DIAGNOSIS — G2581 Restless legs syndrome: Secondary | ICD-10-CM | POA: Diagnosis not present

## 2020-12-20 DIAGNOSIS — G4750 Parasomnia, unspecified: Secondary | ICD-10-CM | POA: Diagnosis not present

## 2020-12-20 DIAGNOSIS — R002 Palpitations: Secondary | ICD-10-CM | POA: Diagnosis not present

## 2020-12-22 DIAGNOSIS — R419 Unspecified symptoms and signs involving cognitive functions and awareness: Secondary | ICD-10-CM | POA: Insufficient documentation

## 2020-12-22 DIAGNOSIS — R413 Other amnesia: Secondary | ICD-10-CM | POA: Insufficient documentation

## 2020-12-23 ENCOUNTER — Telehealth: Payer: Self-pay | Admitting: Neurology

## 2020-12-23 ENCOUNTER — Other Ambulatory Visit: Admitting: *Deleted

## 2020-12-23 NOTE — Telephone Encounter (Signed)
Noted  

## 2020-12-23 NOTE — Telephone Encounter (Signed)
Pt cancelled EEG appt due to not feeling good. I feel fine lying down, but when I stand up I feel nausea.

## 2020-12-24 NOTE — Telephone Encounter (Signed)
FYI: Pt called to cancel appt because she did not feel well.

## 2020-12-25 ENCOUNTER — Other Ambulatory Visit: Payer: Self-pay | Admitting: Internal Medicine

## 2020-12-25 DIAGNOSIS — N63 Unspecified lump in unspecified breast: Secondary | ICD-10-CM

## 2020-12-26 ENCOUNTER — Ambulatory Visit (INDEPENDENT_AMBULATORY_CARE_PROVIDER_SITE_OTHER): Payer: Medicare HMO | Admitting: Neurology

## 2020-12-26 DIAGNOSIS — R419 Unspecified symptoms and signs involving cognitive functions and awareness: Secondary | ICD-10-CM

## 2020-12-26 DIAGNOSIS — R410 Disorientation, unspecified: Secondary | ICD-10-CM

## 2020-12-26 DIAGNOSIS — R413 Other amnesia: Secondary | ICD-10-CM

## 2020-12-26 DIAGNOSIS — R4189 Other symptoms and signs involving cognitive functions and awareness: Secondary | ICD-10-CM

## 2020-12-26 LAB — CBC WITH DIFFERENTIAL/PLATELET
Basophils Absolute: 0 10*3/uL (ref 0.0–0.2)
Basos: 1 %
EOS (ABSOLUTE): 0.1 10*3/uL (ref 0.0–0.4)
Eos: 2 %
Hematocrit: 44.8 % (ref 34.0–46.6)
Hemoglobin: 14.9 g/dL (ref 11.1–15.9)
Immature Grans (Abs): 0 10*3/uL (ref 0.0–0.1)
Immature Granulocytes: 0 %
Lymphocytes Absolute: 2.2 10*3/uL (ref 0.7–3.1)
Lymphs: 26 %
MCH: 29.8 pg (ref 26.6–33.0)
MCHC: 33.3 g/dL (ref 31.5–35.7)
MCV: 90 fL (ref 79–97)
Monocytes Absolute: 0.7 10*3/uL (ref 0.1–0.9)
Monocytes: 9 %
Neutrophils Absolute: 5.1 10*3/uL (ref 1.4–7.0)
Neutrophils: 62 %
Platelets: 259 10*3/uL (ref 150–450)
RBC: 5 x10E6/uL (ref 3.77–5.28)
RDW: 12.4 % (ref 11.7–15.4)
WBC: 8.2 10*3/uL (ref 3.4–10.8)

## 2020-12-26 LAB — COMPREHENSIVE METABOLIC PANEL
ALT: 20 IU/L (ref 0–32)
AST: 19 IU/L (ref 0–40)
Albumin/Globulin Ratio: 2 (ref 1.2–2.2)
Albumin: 4.3 g/dL (ref 3.7–4.7)
Alkaline Phosphatase: 91 IU/L (ref 44–121)
BUN/Creatinine Ratio: 20 (ref 12–28)
BUN: 16 mg/dL (ref 8–27)
Bilirubin Total: 0.4 mg/dL (ref 0.0–1.2)
CO2: 24 mmol/L (ref 20–29)
Calcium: 9.8 mg/dL (ref 8.7–10.3)
Chloride: 101 mmol/L (ref 96–106)
Creatinine, Ser: 0.79 mg/dL (ref 0.57–1.00)
Globulin, Total: 2.1 g/dL (ref 1.5–4.5)
Glucose: 118 mg/dL — ABNORMAL HIGH (ref 70–99)
Potassium: 3.9 mmol/L (ref 3.5–5.2)
Sodium: 140 mmol/L (ref 134–144)
Total Protein: 6.4 g/dL (ref 6.0–8.5)
eGFR: 78 mL/min/{1.73_m2} (ref >59)

## 2020-12-26 LAB — AMMONIA: Ammonia: 62 ug/dL (ref 31–169)

## 2020-12-26 LAB — TSH: TSH: 0.654 u[IU]/mL (ref 0.450–4.500)

## 2020-12-26 LAB — RPR: RPR Ser Ql: NONREACTIVE

## 2020-12-26 LAB — VITAMIN B1: Thiamine: 155.5 nmol/L (ref 66.5–200.0)

## 2020-12-26 LAB — HOMOCYSTEINE: Homocysteine: 9.7 umol/L (ref 0.0–19.2)

## 2020-12-26 LAB — B12 AND FOLATE PANEL
Folate: 18.1 ng/mL (ref >3.0)
Vitamin B-12: 507 pg/mL (ref 232–1245)

## 2020-12-26 LAB — VITAMIN D 25 HYDROXY (VIT D DEFICIENCY, FRACTURES): Vit D, 25-Hydroxy: 35.8 ng/mL (ref 30.0–100.0)

## 2020-12-26 LAB — METHYLMALONIC ACID, SERUM: Methylmalonic Acid: 137 nmol/L (ref 0–378)

## 2020-12-26 LAB — SEDIMENTATION RATE: Sed Rate: 24 mm/hr (ref 0–40)

## 2020-12-26 NOTE — Procedures (Signed)
° ° °  History:  72 with memory decline   EEG classification:  Awake and asleep  Description of the recording: The background rhythms of this recording consists of a fairly well modulated medium amplitude background activity of 6-7 Hz. As the record progresses, the patient initially is in the waking state, but appears to enter the early stage II sleep during the recording, with rudimentary sleep spindles and vertex sharp wave activity seen. During the wakeful state, photic stimulation is performed, and no abnormal responses were seen. Hyperventilation was not performed. No epileptiform discharges seen during this recording. There was no focal slowing. EKG monitor shows no evidence of cardiac rhythm abnormalities with a heart rate of 84.  Impression: This is an abnormal EEG recording in the waking and sleeping state due to mild diffuse slowing. Diffuse slowing is consistent with a generalized brain dysfunction such as in encephalopathy. Diffuse slowing can also be seen in patient with dementia.    Alric Ran, MD [facility]

## 2020-12-31 ENCOUNTER — Telehealth: Payer: Self-pay | Admitting: *Deleted

## 2020-12-31 NOTE — Telephone Encounter (Signed)
The patient returned my call. We discussed the results of the EEG. She verbalized understanding. She stated that she was under the impression Dr Jaynee Eagles would be speaking with her other physicians, Dr Isidoro Donning (sleep) and/or Dr Fara Olden (PCP) to decide whether or not pt would benefit from another sleep study. She said she hasn't heard anything from anyone. I let her know I would see what Dr Jaynee Eagles says and give her a call back. It may tomorrow for a return call. Pt was appreciative.

## 2020-12-31 NOTE — Telephone Encounter (Signed)
-----   Message from Melvenia Beam, MD sent at 12/31/2020  1:00 PM EST ----- Patient has not viewed mychart results, please call: There is no seizure activity but it the waves are slowed. We can see this in drowsiness/tired but can also be seen with medications that can make you foggy, illness, memory loss or dementia. We will have to continue with the workup to keep looking for the answer. But the important thing here is that we did not see any evidence of seizures. Thanks, Dr. Jaynee Eagles

## 2020-12-31 NOTE — Telephone Encounter (Signed)
I called the pt and LVM (ok per DPR) advising patient of her EEG results as noted in detail below by Dr Jaynee Eagles. Advised pt we will continue with the workup to keep looking for the answer but this particular test showed no evidence of any seizures which is good. Left office number in message for call back if she has any questions.

## 2021-01-16 ENCOUNTER — Encounter (HOSPITAL_BASED_OUTPATIENT_CLINIC_OR_DEPARTMENT_OTHER): Payer: Self-pay

## 2021-01-16 DIAGNOSIS — G4733 Obstructive sleep apnea (adult) (pediatric): Secondary | ICD-10-CM

## 2021-01-19 ENCOUNTER — Inpatient Hospital Stay: Admission: RE | Admit: 2021-01-19 | Payer: Medicare HMO | Source: Ambulatory Visit

## 2021-01-23 ENCOUNTER — Other Ambulatory Visit: Payer: Medicare HMO

## 2021-02-06 DIAGNOSIS — E119 Type 2 diabetes mellitus without complications: Secondary | ICD-10-CM | POA: Diagnosis not present

## 2021-02-06 DIAGNOSIS — H04123 Dry eye syndrome of bilateral lacrimal glands: Secondary | ICD-10-CM | POA: Diagnosis not present

## 2021-02-06 DIAGNOSIS — H26493 Other secondary cataract, bilateral: Secondary | ICD-10-CM | POA: Diagnosis not present

## 2021-02-06 DIAGNOSIS — H401131 Primary open-angle glaucoma, bilateral, mild stage: Secondary | ICD-10-CM | POA: Diagnosis not present

## 2021-02-06 DIAGNOSIS — H524 Presbyopia: Secondary | ICD-10-CM | POA: Diagnosis not present

## 2021-02-17 ENCOUNTER — Other Ambulatory Visit: Payer: Self-pay

## 2021-02-17 ENCOUNTER — Ambulatory Visit (HOSPITAL_BASED_OUTPATIENT_CLINIC_OR_DEPARTMENT_OTHER): Payer: Medicare HMO | Attending: Internal Medicine | Admitting: Sleep Medicine

## 2021-02-17 DIAGNOSIS — G4733 Obstructive sleep apnea (adult) (pediatric): Secondary | ICD-10-CM | POA: Diagnosis not present

## 2021-02-17 DIAGNOSIS — G4752 REM sleep behavior disorder: Secondary | ICD-10-CM | POA: Diagnosis not present

## 2021-02-25 DIAGNOSIS — G4733 Obstructive sleep apnea (adult) (pediatric): Secondary | ICD-10-CM | POA: Diagnosis not present

## 2021-03-03 ENCOUNTER — Other Ambulatory Visit: Payer: Self-pay | Admitting: Cardiovascular Disease

## 2021-03-03 NOTE — Telephone Encounter (Signed)
Rx(s) sent to pharmacy electronically.  

## 2021-03-13 DIAGNOSIS — Z0100 Encounter for examination of eyes and vision without abnormal findings: Secondary | ICD-10-CM | POA: Diagnosis not present

## 2021-03-17 NOTE — Procedures (Signed)
° °  NAME: Danielle [name] DATE OF BIRTH:  Apr 05, 1945 MEDICAL RECORD NUMBER [id number]  LOCATION: [facility]  PHYSICIAN:  D   DATE OF STUDY: 02/17/2021  SLEEP STUDY TYPE: Positive Airway Pressure Titration               REFERRING PHYSICIAN: [name],*  CLINICAL INFORMATION [name] is a 76 year old Female and was referred to the sleep center for CPAP titration study and evaluation of REM behavior disorder.  MEDICATIONS Patient self administered medications include: N/A. No sleep medicine administered.  SLEEP STUDY TECHNIQUE The patient underwent an attended overnight polysomnography titration to assess the effects of CPAP therapy. The following variables were monitored: EEG(C4-A1, C3-A2, O1-A2, O2-A1), EOG, submental and leg EMG, ECG, oxyhemoglobin saturation by pulse oximetry, thoracic and abdominal respiratory effort belts, nasal/oral airflow by pressure sensor, body position sensor and snoring sensor. CPAP pressure was titrated to eliminate apneas, hypopneas and oxygen desaturation. Hypopneas were scored per AASM definition IB (4% desaturation)  TECHNICIAN COMMENTS Comments added by Technician: RBD observed during the night. Patient talked in his/her sleep. Comments added by Scorer: N/A SLEEP ARCHITECTURE The study was initiated at 10:51:38 PM and terminated at 5:22:56 AM. Total recorded time was 391.3 minutes. EEG confirmed total sleep time was 315 minutes yielding a sleep efficiency of 80.5%. Sleep onset after lights out was 14.7 minutes with a REM latency of 342.5 minutes. The patient spent 11.1% of the night in stage N1 sleep, 79.7% in stage N2 sleep, 0.0% in stage N3 and 9.2% in REM. The Arousal Index was 22.5/hour. RESPIRATORY PARAMETERS The overall AHI was 1.5 per hour, and the RDI was 3.0 events/hour with a central apnea index of 0 per hour. The most appropriate setting of CPAP was 14 cm H2O. The oxygen nadir was 89.0% during  sleep.    The cumulative time under 88% oxygen saturation was 5.5 minutes  LEG MOVEMENT DATA The total leg movements were 429 with a resulting leg movement index of 81.7/hr. Associated arousal with leg movement index was 7.4/hr. CARDIAC DATA The underlying cardiac rhythm was most consistent with sinus rhythm. Mean heart rate during sleep was 81.9 bpm. 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 - Avoid alcohol, sedatives and other CNS depressants that may worsen sleep apnea and disrupt normal sleep architecture. - Sleep hygiene should be reviewed to assess factors that may improve sleep quality. - Weight management and regular exercise should be initiated or continued.   Elmarie Mainland, MD Diplomate, American Board of Sleep Medicine  ELECTRONICALLY SIGNED ON:  03/17/2021, 10:42 AM [place] PH: [phone]   FX: [phone] [place]

## 2021-05-05 DIAGNOSIS — E039 Hypothyroidism, unspecified: Secondary | ICD-10-CM | POA: Diagnosis not present

## 2021-05-05 DIAGNOSIS — R7989 Other specified abnormal findings of blood chemistry: Secondary | ICD-10-CM | POA: Diagnosis not present

## 2021-05-05 DIAGNOSIS — L299 Pruritus, unspecified: Secondary | ICD-10-CM | POA: Diagnosis not present

## 2021-05-05 DIAGNOSIS — E559 Vitamin D deficiency, unspecified: Secondary | ICD-10-CM | POA: Diagnosis not present

## 2021-05-13 ENCOUNTER — Encounter: Payer: Medicare HMO | Attending: Psychology | Admitting: Psychology

## 2021-05-13 ENCOUNTER — Encounter: Payer: Self-pay | Admitting: Psychology

## 2021-05-13 DIAGNOSIS — F430 Acute stress reaction: Secondary | ICD-10-CM | POA: Diagnosis not present

## 2021-05-13 DIAGNOSIS — F410 Panic disorder [episodic paroxysmal anxiety] without agoraphobia: Secondary | ICD-10-CM | POA: Insufficient documentation

## 2021-05-13 DIAGNOSIS — R413 Other amnesia: Secondary | ICD-10-CM | POA: Insufficient documentation

## 2021-05-13 DIAGNOSIS — G4733 Obstructive sleep apnea (adult) (pediatric): Secondary | ICD-10-CM | POA: Diagnosis not present

## 2021-05-13 DIAGNOSIS — F411 Generalized anxiety disorder: Secondary | ICD-10-CM | POA: Diagnosis not present

## 2021-05-13 NOTE — Progress Notes (Signed)
Neuropsychological Consultation ? ? ?Patient:   Danielle [name]  ? ?DOB:   15-Mar-1945 ? ?MR Number:  [id number] ? ?Location:  [facility] ?[place] [place] ?[id number] ?[place] [address] ?Dept: [phone] ?          ?Date of Service:   05/13/2021 ? ?Start Time:   8 AM ?End Time:   10 AM ? ?Today's visit was an in person visit that was conducted in my outpatient clinic office.  The patient came alone but had seen neurologist with husband and reviewed information that he supplied to treating neurologist.  1 hour and 15 minutes was spent in face-to-face clinical interview and the other 45 minutes was spent with record review, report writing and setting up testing protocols. ? ?Provider/Observer:  Ilean Skill, Psy.D.   ?    Clinical Neuropsychologist ?     ? ?Billing Code/Service: [id number] ? ?Chief Complaint:    Danielle [name] Is a 76 year old female referred for neuropsychological evaluation by her treating neurologist Sarina Ill, MD as part of the larger neurological work-up.   The patient has also be followed by [name], PMHNP for psychiatric care due to anxiety and panic events and referred patient for the neurological evaluation due to memory complaints.  The patient reports that she began having increasing issues with memory retrieval and issues with word finding difficulties roughly 2 years ago.  The patient has also been experiencing increase in anxiety and reports getting lost when driving.  However, when questioning the issue with getting lost the patient reports that it is much more related to fear of getting a loss and that she has not gotten lost at this point.  The patient describes at least 2 previous panic attacks while in stores and becoming confused and having to sit down and calm herself down and on one occasion having to ask someone to help her find her way out of the  store.  However, she reports that when she left the score she was able to find her car with ease and get home effectively.  The patient also describes some other short-term memory issues including not putting items in their appropriate places, having more difficulties performing simple arithmetic or spelling types of issues and difficulty completing her sentences as well as leaving the stove on or leaving water running.  The patient was very anxious during the clinical interview today and acknowledged fear about finding her way here and getting to her appointment on time and concerns about what might happen and what to expect during her visit today.  She acknowledges her level of anxiety is quite high and she has anxiety about most activities which is worsening. ? ?Reason for Service:  Danielle [name] Is a 76 year old female referred for neuropsychological evaluation by her treating neurologist Sarina Ill, MD as part of the larger neurological work-up.   The patient has also be followed by [name], PMHNP for psychiatric care due to anxiety and panic events and referred patient for the neurological evaluation due to memory complaints.  The patient reports that she began having increasing issues with memory retrieval and issues with word finding difficulties roughly 2 years ago.  The patient has also been experiencing increase in anxiety and reports getting lost when driving.  However, when questioning the issue with getting lost the patient reports that it is much more related to fear of getting a loss  and that she has not gotten lost at this point.  The patient describes at least 2 previous panic attacks while in stores and becoming confused and having to sit down and calm herself down and on one occasion having to ask someone to help her find her way out of the store.  However, she reports that when she left the score she was able to find her car with ease and get home effectively.  The patient also  describes some other short-term memory issues including not putting items in their appropriate places, having more difficulties performing simple arithmetic or spelling types of issues and difficulty completing her sentences as well as leaving the stove on or leaving water running. ? ?The patient has a past medical history that includes arthritis, atherosclerosis, CAD, headache, heart murmur, hyperlipidemia and hypertension.  The patient also has a long history of depression with significant anxiety and the development of panic attacks more recently.  The patient has been diagnosed with sleep apnea and has been using a CPAP machine off and on for 4 years.  While the patient has gotten a new device and a new mask for her CPAP she reports that she has been having more trouble wearing it through the night and will wake up often having taken it off.  The patient does attempt to be compliant with her CPAP. ? ?The patient describes her primary difficulties as difficulties remembering or retrieving the words that she wants to say but reports that cueing will help in the moment and she will often remember the word later.  She describes increased symptoms of depression and anxiety and reports that she is crying constantly.  The patient reports that she was getting lost driving but in-depth questioning around this is more related to a fear that she would get lost in actually getting lost.  She reports that she will put things in the wrong place and have difficulty finding later and that she has left the stove on or left water running at times.  She describes 2 instances where she got "lost" in a store and had a panic attack in the store.  She reports that she had to find a place to sit down and calm down and on 1 occasion she had to ask someone to help her find her way out of the store.  She describes a great deal of fear about potentially getting lost rather than actually getting lost when driving.  She reports that she had  her husband bring her to various doctors offices before her appointments to make sure she would be able to find her way.  The patient reports that she has been "forgetting words" more within the past 6 months and feels like it is progressively getting worse. ? ?During the clinical interview with her treating neurologist the patient's husband was also present.  He reported that he has noticed a worsening over the past 6 months with more progression and that the patient has been much more anxious and upset.  He reports that she does not like driving because she gets lost on the road although she knows her turns.  He reported that she will ask the same question during the same day and will often need to stop when talking to tell other people that she does not remember what she was talking about.  The patient is described as getting very anxious when she is trying to do things like paperwork, financing and organizational tasks.  She keeps up  with her own medications but often worries that she is not taking them correctly.  He reports that she has become less social. ? ?The patient reports both in her visit with the neurologist as well as today's visit that she is having more nightmares and has been acting out physically during her sleep and has been diagnosed with REM behavioral disorder.  She described an event recently where she threw herself out of the bed suffering bruising on her breast as she fell out of the bed.  Besides her sleep difficulties including obstructive sleep apnea and possible REM behavioral disorder the patient reports that her appetite is good although she is "stress eating."  The patient has a lot of fear that she may be developing something like Alzheimer's.  The patient reports that she has had a few occasions where she will be sitting and noticed a dark figure like object in the corner of her eye that will move away when she turns to look at it.  This is caused her some stress although it may  be directly related to poor sleep patterns.  She denies any other types of hallucinations or delusions and denies any tremors. ? ?The patient reports that there are no family members that have been diagnosed

## 2021-05-14 DIAGNOSIS — G4733 Obstructive sleep apnea (adult) (pediatric): Secondary | ICD-10-CM | POA: Diagnosis not present

## 2021-05-14 DIAGNOSIS — G4752 REM sleep behavior disorder: Secondary | ICD-10-CM | POA: Diagnosis not present

## 2021-05-15 DIAGNOSIS — R413 Other amnesia: Secondary | ICD-10-CM | POA: Diagnosis not present

## 2021-05-15 DIAGNOSIS — F430 Acute stress reaction: Secondary | ICD-10-CM | POA: Diagnosis not present

## 2021-05-15 DIAGNOSIS — F410 Panic disorder [episodic paroxysmal anxiety] without agoraphobia: Secondary | ICD-10-CM | POA: Diagnosis not present

## 2021-05-15 DIAGNOSIS — G4733 Obstructive sleep apnea (adult) (pediatric): Secondary | ICD-10-CM | POA: Diagnosis not present

## 2021-05-15 DIAGNOSIS — F411 Generalized anxiety disorder: Secondary | ICD-10-CM | POA: Diagnosis not present

## 2021-05-15 NOTE — Progress Notes (Signed)
? ?Behavioral Observations ?The patient appeared well-groomed and appropriately dressed. Her manners were polite and appropriate to the situation. The patient reported that she was feeling "apprehensive" about testing and wanted to skip several sub-tests, although with prompting she was willing to complete them. The patient was anxious and tearful throughout the test. Some word-finding difficulty was observed.  ? ?Neuropsychology Note ? ?Danielle [name] completed 180 minutes of neuropsychological testing with technician, Dina Rich, BA, under the supervision of Ilean Skill, PsyD., Clinical Neuropsychologist. The patient did not appear overtly distressed by the testing session, per behavioral observation or via self-report to the technician. Rest breaks were offered.  ? ?Clinical Decision Making: In considering the patient's current level of functioning, level of presumed impairment, nature of symptoms, emotional and behavioral responses during clinical interview, level of literacy, and observed level of motivation/effort, a battery of tests was selected by Dr. Sima Matas during initial consultation on 05/13/2021. This was communicated to the technician. Communication between the neuropsychologist and technician was ongoing throughout the testing session and changes were made as deemed necessary based on patient performance on testing, technician observations and additional pertinent factors such as those listed above. ? ?Tests Administered: ?Controlled Oral Word Association Test (COWAT; FAS & Animals)  ?Wechsler Adult Intelligence Scale, 4th Edition (WAIS-IV) ?Wechsler Memory Scale, 4th Edition (WMS-IV); Older Adult Battery  ? ? ?Results: ? ?COWAT ?FAS total = 40 ?Z = -0.17 ?Animals total = 18 ?-0.05 ? ? ? ? ? ?WAIS ? ?Composite Score Summary  ?Scale Sum of ?Scaled Scores Composite ?Score Percentile ?Rank 95% Conf. ?Interval Qualitative Description  ?Verbal Comprehension 29 VCI 98 45 92-104 Average   ?Perceptual Reasoning 12 PRI 65 1 61-73 Extremely Low  ?Working Memory 14 WMI 83 13 77-91 Low Average  ?Processing Speed 7 PSI 65 1 60-77 Extremely Low  ?Full Scale 62 FSIQ 74 4 70-79 Borderline  ?General Ability 41 GAI 80 9 76-86 Low Average  ? ? ? ? ?Verbal Comprehension Subtests Summary  ?Subtest Raw Score Scaled Score Percentile Rank Reference Group Scaled Score SEM  ?Similarities '19 8 25 7 '$ 1.12  ?Vocabulary 31 9 37 9 0.73  ?Information 17 12 75 12 0.73  ?(Comprehension) 19 9 37 8 1.08  ? ? ? ? ? ?Perceptual Reasoning Subtests Summary  ?Subtest Raw Score Scaled Score Percentile Rank Reference Group Scaled Score SEM  ?Block Design '8 3 1 2 '$ 1.27  ?Matrix Reasoning '7 8 25 3 '$ 0.73  ?Visual Puzzles 0 1 0.1 1 0.99  ?(Picture Completion) '6 8 25 5 '$ 1.12  ? ? ? ? ? ?Working Wellsite geologist  ?Subtest Raw Score Scaled Score Percentile Rank Reference Group Scaled Score SEM  ?Digit Span '17 6 9 4 '$ 0.79  ?Arithmetic '10 8 25 7 '$ 0.95  ? ? ? ? ? ?Processing Speed Subtests Summary  ?Subtest Raw Score Scaled Score Percentile Rank Reference Group Scaled Score SEM  ?Symbol Search 3 2 0.4 1 1.12  ?Coding '20 5 5 1 '$ 1.12  ? ? ? ? ? ? ?WMS ? ?Index Score Summary  ?Index Sum of Scaled Scores Index Score Percentile ?Rank 95% Confidence ?Interval Qualitative Descriptor  ?Auditory Memory (AMI) 34 91 27 85-98 Average  ?Visual Memory (VMI) 11 76 5 72-82 Borderline  ?Immediate Memory (IMI) 23 85 16 80-92 Low Average  ?Delayed Memory (DMI) 22 83 13 77-92 Low Average  ? ? ? ? ?Primary Subtest Scaled Score Summary  ?Subtest Domain Raw Score Scaled Score Percentile Rank  ?Logical Memory  I AM '20 7 16  '$ ?Logical Memory II AM '11 8 25  '$ ?Verbal Paired Associates I AM 21 11 63  ?Verbal Paired Associates II AM '4 8 25  '$ ?Visual Reproduction I VM '16 5 5  '$ ?Visual Reproduction II VM '5 6 9  '$ ?Symbol Span VWM '8 6 9  '$ ? ? ? ? ?Auditory Memory Process Score Summary  ?Process Score Raw Score Scaled Score Percentile Rank Cumulative Percentage ?(Base Rate)  ?LM II  Recognition 19 - - 51-75%  ?VPA II Recognition 27 - - 26-50%  ? ? ? ? ? ?Visual Memory Process Score Summary  ?Process Score Raw Score Scaled Score Percentile Rank Cumulative Percentage ?(Base Rate)  ?VR II Recognition 1 - - 3-9%  ? ? ? ? ?ABILITY-MEMORY ANALYSIS ? ?Ability Score:  VCI: 98 ?Date of Testing:  WAIS-IV; WMS-IV 2021/05/15 ? ?Predicted Difference Method   ?Index Predicted ?WMS-IV ?Index Score Actual ?WMS-IV ?Index Score Difference Critical Value ? Significant ?Difference ?Y/N Base Rate  ?Auditory Memory 99 91 8 9.39 N   ?Visual Memory 99 76 23 8.28 Y 5%  ?Immediate Memory 99 85 14 10.49 Y 10-15%  ?Delayed Memory 99 83 16 12.08 Y 10-15%  ?Statistical significance (critical value) at the .01 level.  ? ? ? ? ? ? ?Feedback to Patient: ?Danielle [name] will return on 10/21/2021 for an interactive feedback session with Dr. Sima Matas at which time her test performances, clinical impressions and treatment recommendations will be reviewed in detail. The patient understands she can contact our office should she require our assistance before this time. ? ?180 minutes spent face-to-face with patient administering standardized tests, 30 minutes spent scoring (technician). [CPT [id number], [id number]] ? ?Full report to follow.  ?

## 2021-06-04 DIAGNOSIS — F331 Major depressive disorder, recurrent, moderate: Secondary | ICD-10-CM | POA: Diagnosis not present

## 2021-06-04 DIAGNOSIS — F410 Panic disorder [episodic paroxysmal anxiety] without agoraphobia: Secondary | ICD-10-CM | POA: Diagnosis not present

## 2021-06-10 DIAGNOSIS — F331 Major depressive disorder, recurrent, moderate: Secondary | ICD-10-CM | POA: Diagnosis not present

## 2021-06-11 ENCOUNTER — Ambulatory Visit
Admission: RE | Admit: 2021-06-11 | Discharge: 2021-06-11 | Disposition: A | Discharge status: Home / Self Care | Payer: Self-pay | Source: Ambulatory Visit | Attending: Internal Medicine | Admitting: Internal Medicine

## 2021-06-11 ENCOUNTER — Ambulatory Visit
Admission: RE | Admit: 2021-06-11 | Discharge: 2021-06-11 | Disposition: A | Discharge status: Home / Self Care | Payer: Medicare HMO | Source: Ambulatory Visit | Attending: Internal Medicine | Admitting: Internal Medicine

## 2021-06-11 DIAGNOSIS — N6312 Unspecified lump in the right breast, upper inner quadrant: Secondary | ICD-10-CM | POA: Diagnosis not present

## 2021-06-11 DIAGNOSIS — R928 Other abnormal and inconclusive findings on diagnostic imaging of breast: Secondary | ICD-10-CM | POA: Diagnosis not present

## 2021-06-11 DIAGNOSIS — N63 Unspecified lump in unspecified breast: Secondary | ICD-10-CM

## 2021-06-11 DIAGNOSIS — N6314 Unspecified lump in the right breast, lower inner quadrant: Secondary | ICD-10-CM | POA: Diagnosis not present

## 2021-06-19 ENCOUNTER — Encounter: Payer: Self-pay | Admitting: Psychology

## 2021-06-19 ENCOUNTER — Encounter: Payer: Medicare HMO | Attending: Psychology | Admitting: Psychology

## 2021-06-19 DIAGNOSIS — G4733 Obstructive sleep apnea (adult) (pediatric): Secondary | ICD-10-CM

## 2021-06-19 DIAGNOSIS — F410 Panic disorder [episodic paroxysmal anxiety] without agoraphobia: Secondary | ICD-10-CM

## 2021-06-19 DIAGNOSIS — F430 Acute stress reaction: Secondary | ICD-10-CM | POA: Insufficient documentation

## 2021-06-19 DIAGNOSIS — R413 Other amnesia: Secondary | ICD-10-CM

## 2021-06-19 DIAGNOSIS — F411 Generalized anxiety disorder: Secondary | ICD-10-CM

## 2021-06-19 DIAGNOSIS — G4752 REM sleep behavior disorder: Secondary | ICD-10-CM | POA: Diagnosis not present

## 2021-06-19 NOTE — Progress Notes (Signed)
Neuropsychological Evaluation   Patient:  Danielle [name]   DOB: 05/15/1945  MR Number: [id number]  Location: [facility] PHYSICAL MEDICINE AND REHABILITATION [place], [place] [id number] [place] [id number] Dept: [phone]  Start: 4 PM End: 5 PM  Provider/Observer:     Edgardo Roys PsyD  Chief Complaint:      Chief Complaint  Patient presents with   Anxiety   Memory Loss   Sleeping Problem   Panic Attack   Other    Word finding difficulties    Reason For Service:      Bonnell Placzek Is a 76 year old female referred for neuropsychological evaluation by her treating neurologist Sarina Ill, MD as part of the larger neurological work-up.   The patient has also be followed by [name], PMHNP for psychiatric care due to anxiety and panic events and referred patient for the neurological evaluation due to memory complaints.  The patient reports that she began having increasing issues with memory retrieval and issues with word finding difficulties roughly 2 years ago.  The patient has also been experiencing increase in anxiety and reports getting lost when driving.  However, when questioning the issue around getting lost, the patient reports that it is much more related to fear of getting a loss and that she has not gotten lost at this point.  The patient describes at least 2 previous panic attacks while in stores and becoming confused and having to sit down and calm herself down and on one occasion having to ask someone to help her find her way out of the store.  However, she reports that when she left the score she was able to find her car with ease and get home effectively.  The patient also describes some other short-term memory issues including not putting items in their appropriate places, having more difficulties performing simple arithmetic or spelling types of issues and difficulty completing  her sentences as well as leaving the stove on or leaving water running.  The patient has a past medical history that includes arthritis, atherosclerosis, CAD, headache, heart murmur, hyperlipidemia and hypertension.  The patient also has a long history of depression with significant anxiety and the development of panic attacks more recently.  The patient has been diagnosed with sleep apnea and has been using a CPAP machine off and on for 4 years.  While the patient has gotten a new device and a new mask for her CPAP she reports that she has been having more trouble wearing it through the night and will wake up often having taken it off.  The patient does attempt to be compliant with her CPAP.  The patient describes her primary difficulties as trouble remembering or retrieving the words that she wants to say but reports that cueing will help in the moment and she will often remember the word later.  She describes increased symptoms of depression and anxiety and reports that she is crying constantly.  The patient reports that she was getting lost driving but in-depth questioning around this is more related to a fear that she would get lost in actually getting lost.  She reports that she will put things in the wrong place and have difficulty finding later and that she has left the stove on or left water running at times.  She describes 2 instances where she got "lost" in a store and had a panic attack in the store.  She  reports that she had to find a place to sit down and calm down and on 1 occasion she had to ask someone to help her find her way out of the store.  She describes a great deal of fear about potentially getting lost rather than actually getting lost when driving.  She reports that she had her husband bring her to various doctors offices before her appointments to make sure she would be able to find her way.  The patient reports that she has been "forgetting words" more within the past 6 months and  feels like it is progressively getting worse.  During the clinical interview with her treating neurologist, the patient's husband was also present.  He reported that he has noticed a worsening over the past 6 months with more progression and that the patient has been much more anxious and upset.  He reports that she does not like driving because she gets lost on the road although she knows her turns.  He reported that she will ask the same question during the same day and will often need to stop when talking to tell other people that she does not remember what she was talking about.  The patient is described as getting very anxious when she is trying to do things like paperwork, financing and organizational tasks.  She keeps up with her own medications but often worries that she is not taking them correctly.  He reports that she has become less social.  The patient reports both in her visit with the neurologist as well as today's visit that she is having more nightmares and has been acting out physically during her sleep and has been diagnosed with REM behavioral disorder.  She described an event recently where she threw herself out of the bed suffering bruising on her breast as she fell out of the bed.  Besides her sleep difficulties including obstructive sleep apnea and possible REM behavioral disorder the patient reports that her appetite is good although she is "stress eating."  The patient has a lot of fear that she may be developing something like Alzheimer's.  The patient reports that she has had a few occasions where she will be sitting and noticed a dark figure like object in the corner of her eye that will move away when she turns to look at it.  This is caused her some stress although it may be directly related to poor sleep patterns.  She denies any other types of hallucinations or delusions and denies any tremors.  The patient reports that there are no family members that have been diagnosed with  any type of progressive dementia although she thinks one of her aunts may have had memory issues later in life.  The patient has had previous blood work done in December 2022 with normal ranges for B12, folate/B1 as well as vitamin D studies.  All of these have been within normal limits.  All other lab work was within normal limits.  Patient has had a recent sleep study conducted that confirmed issues related to obstructive sleep apnea.  She is continuing to use a CPAP machine and has asked about possibly being eligible for the inspire implant aided device although she is likely not eligible for that.  The patient had a recent EEG conducted on 12/26/2020 that was interpreted as abnormal EEG recording with mild diffuse slowing.  The patient did have an order placed to have an MRI but I was not able to find any evidence  that this had been completed.  CT head exam in 2012 showed no acute abnormality and no particular areas of concern at that point.  Tests Administered: Controlled Oral Word Association Test (COWAT; FAS & Animals)  Wechsler Adult Intelligence Scale, 4th Edition (WAIS-IV) Wechsler Memory Scale, 4th Edition (WMS-IV); Older Adult Battery   Participation Level:   Active  Participation Quality:  Appropriate and apprehensive and at times tearful       Behavioral Observation:  The patient appeared well-groomed and appropriately dressed. Her manners were polite and appropriate to the situation. The patient reported that she was feeling "apprehensive" about testing and wanted to skip several sub-tests, although with prompting she was willing to complete them. The patient was anxious and tearful throughout the test. Some word-finding difficulty was observed.   Well Groomed, Alert, and Tearful.   Test Results:   Initially, an estimation was made as to the patient's historic/premorbid intellectual and cognitive functioning.  The patient graduated from high school and took 1 year of community  college and always maintain a good GPA.  She did well in history and grammar type classes but did have some issues with higher level math classes such as trigonometry.  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  Her hobbies have also included reading.  While it is likely that the patient potentially performed higher we will utilize a conservative estimate of global intellectual and cognitive functioning's to be around the average range and we will utilize standard scores of around 100 for comparison purposes regarding predictions of premorbid functioning for comparison purposes.  COWAT FAS total = 40 Z = -0.17 Animals total = 18 -0.05     The patient has described issues with word finding and verbal fluency issues.  In order to obtain an estimation of expressive language and verbal fluency capacity she was administered the control oral Word Association test.  On the FAS test, which assesses lexical fluency the patient produced performances that were within normal limits and consistent with age and education matched comparison groups.  On measures of semantic fluency (animal naming) she also produced a performance that was consistent with normative expectations.  There were no objective findings of either lexical or semantic fluency issues on objective testing although the patient does subjectively report word finding issues and during the formal clinical interview there were indications of word finding difficulties at times.     WAIS-IV             Composite Score Summary         Scale Sum of Scaled Scores Composite Score Percentile Rank 95% Conf. Interval Qualitative Description  Verbal Comprehension 29 VCI 98 45 92-104 Average  Perceptual Reasoning 12 PRI 65 1 61-73 Extremely Low  Working Memory 14 WMI 83 13 77-91 Low Average  Processing Speed 7 PSI 65 1 60-77 Extremely Low  Full Scale 62 FSIQ 74 4 70-79 Borderline  General Ability 41 GAI 80 9  76-86 Low Average    The patient was administered the Wechsler Adult Intelligence Scale-IV to provide an objective assessment of a wide range of cognitive domains.  As the patient is describing changes in cognitive and memory functions the current score should not be seen as indicative of her premorbid lifelong functioning but a description of her current functioning levels.  We obtained 2 composite global scores of functioning.  On the full-scale IQ score she produced a composite score of 74 which  falls at the 4th percentile and is in the borderline range relative to a normative population.  This pattern would suggest that multiple domains of cognitive functioning are impacted as her estimates of premorbid functioning are much higher and she is roughly 25 points below predicted levels of premorbid functioning.  This is roughly 1-1/2 standard deviations or more below likely premorbid functioning levels.  We also calculated the patient's general abilities index score which places less emphasis on auditory encoding variables and information processing speed variables as there are some of the more sensitive measures to acute changes.  The patient produced a general abilities index score of 80 which falls at the 9th percentile and in the low average range and is still well below predicted levels of premorbid functioning.             Verbal Comprehension Subtests Summary      Subtest Raw Score Scaled Score Percentile Rank Reference Group Scaled Score SEM  Similarities '19 8 25 7 '$ 1.12  Vocabulary 31 9 37 9 0.73  Information 17 12 75 12 0.73  (Comprehension) 19 9 37 8 1.08      The patient produced a verbal comprehension index score of 98 which falls at the 45th percentile and is in the average range.  This is essentially consistent with predictions of premorbid functioning.  There was significant variability in subtest performance.  The patient did very well on measures of her general fund of information  and life knowledge.  She performed in the average range or the lower end of the average range on measures of verbal reasoning and problem-solving, vocabulary knowledge and social judgment comprehension.             Perceptual Reasoning Subtests Summary  Subtest Raw Score Scaled Score Percentile Rank Reference Group Scaled Score SEM   Block Design '8 3 1 2 '$ 1.27   Matrix Reasoning '7 8 25 3 '$ 0.73   Visual Puzzles 0 1 0.1 1 0.99   (Picture Completion) '6 8 25 5 '$ 1.12       The patient produced a perceptual reasoning index score of 65 which falls at the 1st percentile and is in the extremely low range relative to a normative population.  This is an extremely low level of performance given estimates of premorbid functioning and suggest significant deficits.  However, there was significant variability in subtest performance.  For example on measures of visual reasoning and problem solving and ability to identify visual anomalies within a visual gestalt the patient performed generally well and only slightly below predicted levels of premorbid functioning (lower end of the average range relative to normative population).  The patient showed severe and profound deficits on the Block design and visual puzzles subtest assessing visual analysis and organization and predictions and estimations of visual information.  However, given the extreme difference between measures that have a great deal of similarity between them and observed behavioral observations I suspect that the fact that the 2 more outwardly appearing demand wise measures being the most severely impaired versus the items that were easier to grasp and required less mental effort and persistent being much higher suggest that apprehension and fear played a role in at least the severity of deficits and the 2 individual subtest she did most poorly in..            Working Electrical engineer Raw Score Scaled Score Percentile Rank Reference  Group Scaled Score SEM  Digit Span '17 6 9 4 '$ 0.79  Arithmetic '10 8 25 7 '$ 0.95    The patient produced a working memory index score of 83 which falls at the 13th percentile and is in the low average range relative to a normative population.  The patient had some difficulty with primary auditory encoding measures but she actually did better when asked to actively process initially encoded information and active cognitive register.  This would suggest that her likely encoding capacities are higher than we observed on the digit span test although she has always been quite good and arithmetic type tasks.              Processing Speed Subtests Summary     Subtest Raw Score Scaled Score Percentile Rank Reference Group Scaled Score SEM  Symbol Search 3 2 0.4 1 1.12  Coding '20 5 5 1 '$ 1.12      The patient produced a processing speed index score of 65 which falls at the 1st percentile relative to a normative population and is in the extremely low range relative to normative population.  These are significantly impaired scores on measures requiring visual scanning, visual searching and overall speed of mental operations.  It is likely that emotional distress and concern about how well she was doing had a particularly negative impact on the subtest performance it also suggest that these are areas of difficulty for her and are consistent with some of the subjective experiences in reports from the patient and her family.       WMS           Index Score Summary       Index Sum of Scaled Scores Index Score Percentile Rank 95% Confidence Interval Qualitative Descriptor  Auditory Memory (AMI) 34 91 27 85-98 Average  Visual Memory (VMI) 11 76 5 72-82 Borderline  Immediate Memory (IMI) 23 85 16 80-92 Low Average  Delayed Memory (DMI) 22 83 13 77-92 Low Average    We administered the Wechsler Memory Scale-IV to provide an objective assessment of multiple domains of memory and learning.  On the Wechsler Adult  Intelligence Scale the patient did fairly well on more complex attentional variables requiring auditory encoding but had difficulty to some degree on more primary auditory encoding task.  On the Wechsler Memory Scale's the patient also showed some difficulties with primary visual encoding and in fact her visual encoding was more impaired than her auditory encoding and fell at the 9th percentile relative to a normative population.  This would suggest that visual encoding is significantly impaired where there is only mild deficits with regard to auditory encoding components.  This would have a particular Dilatair yes impact on the patient's ability to store and organize new visual information.  Breaking down memory functions between auditory versus visual memory the patient produced an auditory memory index score of 91 which falls at the 27th percentile and is in the average range.  While this is slightly below predicted levels it was not significantly impaired relative to normative population and given some of the weaknesses that she has on auditory encoding this would be consistent with issues related to auditory encoding rather than an inability to store and organize new auditory information.  The patient also showed improvements under recognition and cued recall formats as well suggesting that she is adequately storing and organizing new information and the auditory memory weaknesses are primarily attentional in nature.  In sharp contrast to this is the patient's performance on  visual memory tasks.  The patient produced a visual memory index score of 76 which falls at the 5th percentile and is in borderline range relative to normative population.  This is a significantly impaired score.  However, her visual encoding deficits were much greater than or auditory encoding deficits which would account for some of these relative weaknesses causing the memory task be much more problematic.  The patient also did not  show any particular improvement under recognition formats for visual information.  This would suggest that there are encoding, storage and organization issues as the primary issue versus primarily being a retrieval issue.            Primary Subtest Scaled Score Summary      Subtest Domain Raw Score Scaled Score Percentile Rank  Logical Memory I AM '20 7 16  '$ Logical Memory II AM '11 8 25  '$ Verbal Paired Associates I AM 21 11 63  Verbal Paired Associates II AM '4 8 25  '$ Visual Reproduction I VM '16 5 5  '$ Visual Reproduction II VM '5 6 9  '$ Symbol Span VWM '8 6 9                '$ Auditory Memory Process Score Summary      Process Score Raw Score Scaled Score Percentile Rank Cumulative Percentage (Base Rate)  LM II Recognition 19 - - 51-75%  VPA II Recognition 27 - - 26-50%                   Visual Memory Process Score Summary      Process Score Raw Score Scaled Score Percentile Rank Cumulative Percentage (Base Rate)  VR II Recognition 1 - - 3-9%          ABILITY-MEMORY ANALYSIS   Ability Score:    VCI: 98 Date of Testing:           WAIS-IV; WMS-IV 2021/05/15             Predicted Difference Method    Index Predicted WMS-IV Index Score Actual WMS-IV Index Score Difference Critical Value   Significant Difference Y/N Base Rate  Auditory Memory 99 91 8 9.39 N    Visual Memory 99 76 23 8.28 Y 5%  Immediate Memory 99 85 14 10.49 Y 10-15%  Delayed Memory 99 83 16 12.08 Y 10-15%  Statistical significance (critical value) at the .01 level.       Summary of Results:   The results of the objective neuropsychological assessment do come with 1 particular caution about interpreting results.  This was a very stressful experience for the patient and she was quite concerned about how she was doing and try to avoid certain measures that she perceived as being likely to do poorly on.  She became tearful at times but did complete all tasks presented.  Emotional distress during the testing  likely had a deleterious impact on her performance.  We do see some areas that are particularly problematic for the patient and they do have some connection similarity to 1 another.  The patient's primary areas of cognitive deficits assessed had to do with some weaknesses with regard to both auditory and visual encoding capacity with visual encoding more impaired.  The patient showed some significant deficits with regard to visual spatial capacity on rather demanding tasks which showed good visual reasoning abilities on others.  The patient also showed significant deficits on measures of focus execute abilities and information processing speed requiring visual scanning, visual  searching and overall speed of mental operations.  The patient also showed some significant differences between auditory memory and visual memory with relatively well-preserved auditory memory but significant deficits for visual memory that were beyond those that could be solely explained by her greater visual encoding deficits.  She did not improve on visual memory task under recognition or cued recall formats.  The patient was able to retain the information she initially learned after period of delay though.  Impression/Diagnosis:   Overall, the results of the current neuropsychological evaluation do show some indications of hemispheric lateralization of deficits.  The patient's greatest issues were on items that typically require more right hemisphere function versus left hemisphere function although the patient is ambidextrous and some of these localizations of functions may be potentially different from her versus a normative population.  The patient showed significant visual-spatial deficits related to visual analysis and organization and visual estimation and prediction.  However, on other measures requiring visual processing she did better.  The patient also showed significant differences between auditory memory versus visual memory  with visual memory being negative impacted by significant deficits for visual encoding capacity, the ability to store and organize new information and ability to retrieve that information.  However, she did not improve on visual memory tasks like she did on the auditory memory task under cueing and recognition format suggesting that it is related to a combination of encoding and storage and organization for visual memory much more significantly than auditory memory.  This pattern would suggest the possibility of greater right hemisphere dysfunction than left hemisphere dysfunction.  The patient did not show any findings of objective symptoms related to expressive language deficits.  As far as diagnostic considerations, this pattern is clearly not consistent with those typically seen with progressive cortical dementia such as Alzheimer's or Lewy body type symptoms.  While the patient has had a very limited number of visual experiences similar to visual hallucination these are all slowly around times where sleep related disturbance could explain these limited events.  The patient shows no tremors per se as well.  The patient did well on multiple items that would typically be some of the early symptoms that change in a progressive cortical dementia.  Her symptoms are not consistent with subcortical dementia's.  The patient has had good laboratory work-ups particularly for B vitamin studies and other lab work has been good.  I do think that it would be pertinent to get neuroimaging studies like MRI which at this point have not been completed.  The patient was referred for an MRI of the brain by Dr. Jaynee Eagles previously but this was never completed.  Considering the possible lateralization of findings on neuropsychological testing it would be worthwhile having this study completed.  There may be some potential vascular involvement and her symptoms.  The patient has not had any imaging since 2012 when she had a CT study  done after developing significant headaches following a time of riding on a roller coaster with a family history of aneurysms.  The patient also is experiencing significant increase in her anxiety symptoms and panic events and I suspect that the most likely cause of her increasing symptoms and subjective reports of word finding difficulties, memory difficulties, fear of getting lost etc. are most likely attributed to a worsening anxiety disorder.  Combining that with poor sleep patterns could explain many of her subjective reports although they would not explain the lateralization of findings on neuropsychological test data.  The  patient has significant sleep difficulties including obstructive sleep apnea which is now being readdressed as well as possible REM behavioral disorder.  Looking at specific test items that she had the most difficulty on the tended to be ones that required more stamina and persistence to complete and therefore this may be an artifact of her apprehension and emotional distress and taking these measures rather than directly related to them being inherently more problematic for her cognitively.  In any event, the most likely culprit for some of her subjective symptoms are likely to be psychiatric in nature specifically significant increases in anxiety and exacerbation and worsening of her panic disorder.  We cannot rule out particular cerebrovascular events but those would need to be confirmed or disproven by neuro imaging.  As far as treatment recommendations, I do think that the most important variable that needs to be addressed is her significant increase in panic attacks and anxiety symptoms.  She has been prescribed alprazolam previously and was also taking Pristiq at least in 2022.  I am not sure if she is continuing to take any particular SSRI medication.  As Pristiq is more of an SNRI type medication if she is continuing to take that with increasing and worsening panic disorder I  would suggest possibly considering a more focused SSRI strategy with medicines such as Prozac or Lexapro.  The patient would also likely benefit from psychotherapeutic interventions around building better coping skills to manage her anxiety disorder and find ways to reduce the frequency, it intensity and duration of panic events.  We should also follow through with the MRI studies.  I will sit down with the patient and go over the results of the current neuropsychological evaluation as well is making them available to her referring physician and available in her EMR for her other treatment team members.  Diagnosis:    Generalized anxiety disorder  Panic attack as reaction to stress  Memory loss  Obstructive sleep apnea syndrome  REM behavioral disorder   _____________________ Ilean Skill, Psy.D. Clinical Neuropsychologist

## 2021-06-25 DIAGNOSIS — G4733 Obstructive sleep apnea (adult) (pediatric): Secondary | ICD-10-CM | POA: Diagnosis not present

## 2021-07-01 DIAGNOSIS — F331 Major depressive disorder, recurrent, moderate: Secondary | ICD-10-CM | POA: Diagnosis not present

## 2021-07-09 DIAGNOSIS — F332 Major depressive disorder, recurrent severe without psychotic features: Secondary | ICD-10-CM | POA: Diagnosis not present

## 2021-07-09 DIAGNOSIS — F410 Panic disorder [episodic paroxysmal anxiety] without agoraphobia: Secondary | ICD-10-CM | POA: Diagnosis not present

## 2021-07-16 ENCOUNTER — Emergency Department (HOSPITAL_BASED_OUTPATIENT_CLINIC_OR_DEPARTMENT_OTHER): Payer: Medicare HMO

## 2021-07-16 ENCOUNTER — Encounter (HOSPITAL_BASED_OUTPATIENT_CLINIC_OR_DEPARTMENT_OTHER): Payer: Self-pay | Admitting: Emergency Medicine

## 2021-07-16 ENCOUNTER — Emergency Department (HOSPITAL_BASED_OUTPATIENT_CLINIC_OR_DEPARTMENT_OTHER)
Admission: EM | Admit: 2021-07-16 | Discharge: 2021-07-17 | Disposition: A | Discharge status: Home / Self Care | Payer: Medicare HMO | Attending: Emergency Medicine | Admitting: Emergency Medicine

## 2021-07-16 ENCOUNTER — Other Ambulatory Visit: Payer: Self-pay

## 2021-07-16 DIAGNOSIS — I10 Essential (primary) hypertension: Secondary | ICD-10-CM | POA: Diagnosis not present

## 2021-07-16 DIAGNOSIS — Z7982 Long term (current) use of aspirin: Secondary | ICD-10-CM | POA: Diagnosis not present

## 2021-07-16 DIAGNOSIS — R112 Nausea with vomiting, unspecified: Secondary | ICD-10-CM | POA: Diagnosis not present

## 2021-07-16 DIAGNOSIS — Z7984 Long term (current) use of oral hypoglycemic drugs: Secondary | ICD-10-CM | POA: Insufficient documentation

## 2021-07-16 DIAGNOSIS — R519 Headache, unspecified: Secondary | ICD-10-CM | POA: Insufficient documentation

## 2021-07-16 DIAGNOSIS — I2510 Atherosclerotic heart disease of native coronary artery without angina pectoris: Secondary | ICD-10-CM | POA: Diagnosis not present

## 2021-07-16 DIAGNOSIS — N390 Urinary tract infection, site not specified: Secondary | ICD-10-CM | POA: Diagnosis not present

## 2021-07-16 DIAGNOSIS — Z79899 Other long term (current) drug therapy: Secondary | ICD-10-CM | POA: Diagnosis not present

## 2021-07-16 DIAGNOSIS — R600 Localized edema: Secondary | ICD-10-CM | POA: Diagnosis not present

## 2021-07-16 DIAGNOSIS — F419 Anxiety disorder, unspecified: Secondary | ICD-10-CM | POA: Insufficient documentation

## 2021-07-16 DIAGNOSIS — E876 Hypokalemia: Secondary | ICD-10-CM | POA: Diagnosis not present

## 2021-07-16 DIAGNOSIS — Z6841 Body Mass Index (BMI) 40.0 and over, adult: Secondary | ICD-10-CM | POA: Diagnosis not present

## 2021-07-16 LAB — COMPREHENSIVE METABOLIC PANEL
ALT: 37 U/L (ref 0–44)
AST: 35 U/L (ref 15–41)
Albumin: 4.3 g/dL (ref 3.5–5.0)
Alkaline Phosphatase: 80 U/L (ref 38–126)
Anion gap: 13 (ref 5–15)
BUN: 13 mg/dL (ref 8–23)
CO2: 24 mmol/L (ref 22–32)
Calcium: 9.7 mg/dL (ref 8.9–10.3)
Chloride: 102 mmol/L (ref 98–111)
Creatinine, Ser: 0.92 mg/dL (ref 0.44–1.00)
GFR, Estimated: >60 mL/min (ref >60)
Glucose, Bld: 144 mg/dL — ABNORMAL HIGH (ref 70–99)
Potassium: 3.1 mmol/L — ABNORMAL LOW (ref 3.5–5.1)
Sodium: 139 mmol/L (ref 135–145)
Total Bilirubin: 1 mg/dL (ref 0.3–1.2)
Total Protein: 7.5 g/dL (ref 6.5–8.1)

## 2021-07-16 LAB — CBC
HCT: 42.5 % (ref 36.0–46.0)
Hemoglobin: 14.7 g/dL (ref 12.0–15.0)
MCH: 30 pg (ref 26.0–34.0)
MCHC: 34.6 g/dL (ref 30.0–36.0)
MCV: 86.7 fL (ref 80.0–100.0)
Platelets: 172 10*3/uL (ref 150–400)
RBC: 4.9 MIL/uL (ref 3.87–5.11)
RDW: 13.3 % (ref 11.5–15.5)
WBC: 8.6 10*3/uL (ref 4.0–10.5)
nRBC: 0 % (ref 0.0–0.2)

## 2021-07-16 LAB — BRAIN NATRIURETIC PEPTIDE: B Natriuretic Peptide: 15.4 pg/mL (ref 0.0–100.0)

## 2021-07-16 LAB — LIPASE, BLOOD: Lipase: 24 U/L (ref 11–51)

## 2021-07-16 LAB — URINALYSIS, MICROSCOPIC (REFLEX)

## 2021-07-16 LAB — TROPONIN I (HIGH SENSITIVITY): Troponin I (High Sensitivity): 5 ng/L (ref <18)

## 2021-07-16 LAB — URINALYSIS, ROUTINE W REFLEX MICROSCOPIC
Glucose, UA: NEGATIVE mg/dL
Ketones, ur: 15 mg/dL — AB
Nitrite: NEGATIVE
Protein, ur: 100 mg/dL — AB
Specific Gravity, Urine: 1.02 (ref 1.005–1.030)
pH: 6 (ref 5.0–8.0)

## 2021-07-16 MED ORDER — SULFAMETHOXAZOLE-TRIMETHOPRIM 800-160 MG PO TABS
1.0000 | ORAL_TABLET | Freq: Once | ORAL | Status: AC
  Administered 2021-07-16: 1 via ORAL
  Filled 2021-07-16: qty 1

## 2021-07-16 MED ORDER — ONDANSETRON 4 MG PO TBDP
4.0000 mg | ORAL_TABLET | Freq: Once | ORAL | Status: AC
Start: 2021-07-16 — End: 2021-07-16
  Administered 2021-07-16: 4 mg via ORAL
  Filled 2021-07-16: qty 1

## 2021-07-16 MED ORDER — HYDROXYZINE HCL 25 MG PO TABS
50.0000 mg | ORAL_TABLET | Freq: Once | ORAL | Status: AC
Start: 2021-07-16 — End: 2021-07-16
  Administered 2021-07-16: 50 mg via ORAL
  Filled 2021-07-16: qty 2

## 2021-07-16 MED ORDER — ONDANSETRON HCL 4 MG/2ML IJ SOLN: 4.0000 mg | Freq: Once | INTRAMUSCULAR | Status: DC

## 2021-07-16 MED ORDER — LORAZEPAM 1 MG PO TABS
1.0000 mg | ORAL_TABLET | Freq: Once | ORAL | Status: AC
Start: 2021-07-16 — End: 2021-07-16
  Administered 2021-07-16: 1 mg via ORAL
  Filled 2021-07-16: qty 1

## 2021-07-16 MED ORDER — LORAZEPAM 2 MG/ML IJ SOLN: 0.5000 mg | Freq: Once | INTRAMUSCULAR | Status: DC

## 2021-07-16 NOTE — ED Provider Notes (Signed)
[facility] Provider Note   CSN: [id number] Arrival date & time: 07/16/21  1931     History {Add pertinent medical, surgical, social history, OB history to HPI:1} Chief Complaint  Patient presents with   Emesis   Leg Swelling    Danielle [name] is a 76 y.o. female.  HPI     76 year old female with history of CAD, hyperlipidemia, hypertension, OSA  Feeling very anxious Has been feeling this way for a month or two Anxiety regarding results of the memory test Scared and husband angry because she is coming to ED Also has something-maybe some kind of flu, is vomiting, having trouble having BM for a few days Getting worse   Recently seen for neuropsychological evaluation, for memory concerns, anxiety and panic attacks  N/v a couple times today A few episodes of diarrhea last 2 days No fever that she knows of  The patient has had good laboratory work-ups particularly for B vitamin studies and other lab work has been good.  I do think that it would be pertinent to get neuroimaging studies like MRI which at this point have not been completed.  The patient was referred for an MRI of the brain by Dr. Jaynee Eagles previously but this was never completed.  Considering the possible lateralization of findings on neuropsychological testing it would be worthwhile having this study completed.  There may be some potential vascular involvement and her symptoms.   Home Medications Prior to Admission medications   Medication Sig Start Date End Date Taking? Authorizing Provider  ALPRAZolam (XANAX) 0.25 MG tablet Take 0.25 mg by mouth daily as needed.    [provider]  ALPRAZolam Duanne Moron) 0.25 MG tablet Take 1-2 tabs (0.'25mg'$ -0.'50mg'$ ) 30-60 minutes before procedure. May repeat if needed.Do not drive. 12/18/20   Melvenia Beam, MD  aspirin EC 81 MG tablet Take 1 tablet (81 mg total) by mouth daily. 10/20/16   Strader, Fransisco Hertz, PA-C  Biotin 5000 MCG CAPS Take  1 capsule (5,000 mcg total) by mouth 1 day or 1 dose. 10/08/16   Nita Sells, MD  CALCIUM-VITAMIN D PO Take 1 capsule by mouth daily. 1200-1000 calcium-vitamin d    [provider]  cetirizine (ZYRTEC) 10 MG tablet Take 10 mg by mouth daily as needed for allergies.     [provider]  desvenlafaxine (PRISTIQ) 100 MG 24 hr tablet Take 100 mg by mouth daily. 08/16/20   [provider]  desvenlafaxine (PRISTIQ) 50 MG 24 hr tablet Take 50 mg by mouth daily. Takes with '100mg'$  tablet (total '150mg'$  daily) 10/22/20   [provider]  hydrochlorothiazide (HYDRODIURIL) 25 MG tablet Take 25 mg by mouth daily.    [provider]  levothyroxine (SYNTHROID, LEVOTHROID) 137 MCG tablet Take 137 mcg by mouth daily before breakfast.    [provider]  losartan (COZAAR) 50 MG tablet Take 50 mg by mouth daily.    [provider]  Magnesium 100 MG CAPS Take 100 mg by mouth daily.    [provider]  metFORMIN (GLUCOPHAGE-XR) 500 MG 24 hr tablet Take 500 mg by mouth daily with breakfast.    [provider]  metoprolol tartrate (LOPRESSOR) 25 MG tablet TAKE 1/2 TO 1 TABLET IN THE MORNING AND AT BEDTIME FOR PALPITATIONS. NEED APPOINTMENT FOR FURTHER REFILLS 03/03/21   Skeet Latch, MD  oxyCODONE (OXY IR/ROXICODONE) 5 MG immediate release tablet Take 1 tablet (5 mg total) by mouth every 6 (six) hours as needed  for severe pain. 08/15/19   Georganna Skeans, MD  pantoprazole (PROTONIX) 40 MG tablet Take 1 tablet (40 mg total) by mouth 2 (two) times daily. Patient taking differently: Take 40 mg by mouth daily. 10/08/16   Nita Sells, MD  rosuvastatin (CRESTOR) 40 MG tablet Take 1 tablet (40 mg total) by mouth daily. 08/18/17   Skeet Latch, MD      Allergies    Ceftin [cefuroxime axetil], Codeine, and Compazine [prochlorperazine]    Review of Systems   Review of Systems  Physical Exam Updated Vital Signs BP (!) 137/58    Pulse 93   Temp 98 F (36.7 C) (Oral)   Resp 20   Ht '5\' 2"'$  (1.575 m)   Wt 99.8 kg   SpO2 97%   BMI 40.24 kg/m  Physical Exam  ED Results / Procedures / Treatments   Labs (all labs ordered are listed, but only abnormal results are displayed) Labs Reviewed  COMPREHENSIVE METABOLIC PANEL - Abnormal; Notable for the following components:      Result Value   Potassium 3.1 (*)    Glucose, Bld 144 (*)    All other components within normal limits  LIPASE, BLOOD  CBC  URINALYSIS, ROUTINE W REFLEX MICROSCOPIC  BRAIN NATRIURETIC PEPTIDE  TROPONIN I (HIGH SENSITIVITY)    EKG None  Radiology No results found.  Procedures Procedures  {Document cardiac monitor, telemetry assessment procedure when appropriate:1}  Medications Ordered in ED Medications - No data to display  ED Course/ Medical Decision Making/ A&P                           Medical Decision Making Amount and/or Complexity of Data Reviewed Labs: ordered.   ***  {Document critical care time when appropriate:1} {Document review of labs and clinical decision tools ie heart score, Chads2Vasc2 etc:1}  {Document your independent review of radiology images, and any outside records:1} {Document your discussion with family members, caretakers, and with consultants:1} {Document social determinants of health affecting pt's care:1} {Document your decision making why or why not admission, treatments were needed:1} Final Clinical Impression(s) / ED Diagnoses Final diagnoses:  None    Rx / DC Orders ED Discharge Orders     None

## 2021-07-16 NOTE — ED Notes (Signed)
ED Provider at bedside. 

## 2021-07-16 NOTE — ED Triage Notes (Signed)
Pt states she has been sick x 3 days  Pt states she is unable to eat  Pt states she has been vomiting  Pt has swelling in her lower extremities and states they are burning like they are on fire  Pt states she feels very anxious and she has been shaky

## 2021-07-17 ENCOUNTER — Telehealth: Payer: Self-pay | Admitting: Neurology

## 2021-07-17 DIAGNOSIS — N390 Urinary tract infection, site not specified: Secondary | ICD-10-CM | POA: Diagnosis not present

## 2021-07-17 MED ORDER — POTASSIUM CHLORIDE CRYS ER 20 MEQ PO TBCR
40.0000 meq | EXTENDED_RELEASE_TABLET | Freq: Once | ORAL | Status: AC
  Administered 2021-07-17: 40 meq via ORAL
  Filled 2021-07-17: qty 2

## 2021-07-17 MED ORDER — HYDROXYZINE HCL 25 MG PO TABS: 25.0000 mg | ORAL_TABLET | Freq: Four times a day (QID) | ORAL | 0 refills | Status: DC | PRN

## 2021-07-17 MED ORDER — ONDANSETRON 4 MG PO TBDP: 4.0000 mg | ORAL_TABLET | Freq: Three times a day (TID) | ORAL | 0 refills | Status: DC | PRN

## 2021-07-17 MED ORDER — SULFAMETHOXAZOLE-TRIMETHOPRIM 800-160 MG PO TABS: 1.0000 | ORAL_TABLET | Freq: Two times a day (BID) | ORAL | 0 refills | Status: AC

## 2021-07-17 NOTE — ED Notes (Signed)
Rx x 3 given  Written and verbal inst to pt  Verbalized an understanding  To home with husband

## 2021-07-17 NOTE — ED Notes (Signed)
Placed patient on 4L [place] due to patient wears Cpap at home for sleep apnea. Patient tolerating well

## 2021-07-17 NOTE — Telephone Encounter (Signed)
[facility] Marye Round) calling on behalf Christia Reading, NP. Requesting  copy neuropsychic testing results. Have left a voicemail. Would like a call back.

## 2021-07-18 LAB — URINE CULTURE

## 2021-07-23 ENCOUNTER — Other Ambulatory Visit: Payer: Self-pay | Admitting: Internal Medicine

## 2021-07-23 ENCOUNTER — Ambulatory Visit
Admission: RE | Admit: 2021-07-23 | Discharge: 2021-07-23 | Disposition: A | Discharge status: Home / Self Care | Payer: Medicare HMO | Source: Ambulatory Visit | Attending: Internal Medicine | Admitting: Internal Medicine

## 2021-07-23 DIAGNOSIS — M79671 Pain in right foot: Secondary | ICD-10-CM | POA: Diagnosis not present

## 2021-07-23 DIAGNOSIS — R609 Edema, unspecified: Secondary | ICD-10-CM | POA: Diagnosis not present

## 2021-07-23 DIAGNOSIS — G4733 Obstructive sleep apnea (adult) (pediatric): Secondary | ICD-10-CM | POA: Diagnosis not present

## 2021-07-23 DIAGNOSIS — M79672 Pain in left foot: Secondary | ICD-10-CM | POA: Diagnosis not present

## 2021-07-23 DIAGNOSIS — M7989 Other specified soft tissue disorders: Secondary | ICD-10-CM | POA: Diagnosis not present

## 2021-07-23 DIAGNOSIS — E876 Hypokalemia: Secondary | ICD-10-CM | POA: Diagnosis not present

## 2021-07-23 DIAGNOSIS — F3341 Major depressive disorder, recurrent, in partial remission: Secondary | ICD-10-CM | POA: Diagnosis not present

## 2021-07-23 DIAGNOSIS — F419 Anxiety disorder, unspecified: Secondary | ICD-10-CM | POA: Diagnosis not present

## 2021-07-29 ENCOUNTER — Encounter: Payer: Self-pay | Admitting: Psychology

## 2021-07-29 ENCOUNTER — Encounter: Payer: Medicare HMO | Attending: Psychology | Admitting: Psychology

## 2021-07-29 DIAGNOSIS — F410 Panic disorder [episodic paroxysmal anxiety] without agoraphobia: Secondary | ICD-10-CM | POA: Insufficient documentation

## 2021-07-29 DIAGNOSIS — R413 Other amnesia: Secondary | ICD-10-CM | POA: Insufficient documentation

## 2021-07-29 DIAGNOSIS — F411 Generalized anxiety disorder: Secondary | ICD-10-CM | POA: Diagnosis not present

## 2021-07-29 DIAGNOSIS — F331 Major depressive disorder, recurrent, moderate: Secondary | ICD-10-CM | POA: Diagnosis not present

## 2021-07-29 DIAGNOSIS — F430 Acute stress reaction: Secondary | ICD-10-CM | POA: Insufficient documentation

## 2021-07-29 DIAGNOSIS — G4752 REM sleep behavior disorder: Secondary | ICD-10-CM | POA: Insufficient documentation

## 2021-07-29 DIAGNOSIS — G4733 Obstructive sleep apnea (adult) (pediatric): Secondary | ICD-10-CM | POA: Insufficient documentation

## 2021-07-29 NOTE — Progress Notes (Signed)
07/29/2021 8 AM-9 AM:  Today's visit was an in person visit was conducted in my outpatient clinic office.  The patient, her husband and her son were present for this feedback.  We reviewed the results of the recent neuropsychological evaluation and went into more depth regarding recommendations.  I have included a copy of the summary below from the formal neuropsychological evaluation that can be found in its entirety in the patient's EMR dated 06/19/2021.    Impression/Diagnosis:                     Overall, the results of the current neuropsychological evaluation do show some indications of hemispheric lateralization of deficits.  The patient's greatest issues were on items that typically require more right hemisphere function versus left hemisphere function although the patient is ambidextrous and some of these localizations of functions may be potentially different from her versus a normative population.  The patient showed significant visual-spatial deficits related to visual analysis and organization and visual estimation and prediction.  However, on other measures requiring visual processing she did better.  The patient also showed significant differences between auditory memory versus visual memory with visual memory being negative impacted by significant deficits for visual encoding capacity, the ability to store and organize new information and ability to retrieve that information.  However, she did not improve on visual memory tasks like she did on the auditory memory task under cueing and recognition format suggesting that it is related to a combination of encoding and storage and organization for visual memory much more significantly than auditory memory.  This pattern would suggest the possibility of greater right hemisphere dysfunction than left hemisphere dysfunction.  The patient did not show any findings of objective symptoms related to expressive language deficits.  As far as diagnostic  considerations, this pattern is clearly not consistent with those typically seen with progressive cortical dementia such as Alzheimer's or Lewy body type symptoms.  While the patient has had a very limited number of visual experiences similar to visual hallucination these are all slowly around times where sleep related disturbance could explain these limited events.  The patient shows no tremors per se as well.  The patient did well on multiple items that would typically be some of the early symptoms that change in a progressive cortical dementia.  Her symptoms are not consistent with subcortical dementia's.  The patient has had good laboratory work-ups particularly for B vitamin studies and other lab work has been good.  I do think that it would be pertinent to get neuroimaging studies like MRI which at this point have not been completed.  The patient was referred for an MRI of the brain by Dr. Jaynee Eagles previously but this was never completed.  Considering the possible lateralization of findings on neuropsychological testing it would be worthwhile having this study completed.  There may be some potential vascular involvement and her symptoms.  The patient has not had any imaging since 2012 when she had a CT study done after developing significant headaches following a time of riding on a roller coaster with a family history of aneurysms.  The patient also is experiencing significant increase in her anxiety symptoms and panic events and I suspect that the most likely cause of her increasing symptoms and subjective reports of word finding difficulties, memory difficulties, fear of getting lost etc. are most likely attributed to a worsening anxiety disorder.  Combining that with poor sleep patterns could explain many of her  subjective reports although they would not explain the lateralization of findings on neuropsychological test data.  The patient has significant sleep difficulties including obstructive sleep apnea  which is now being readdressed as well as possible REM behavioral disorder.  Looking at specific test items that she had the most difficulty on the tended to be ones that required more stamina and persistence to complete and therefore this may be an artifact of her apprehension and emotional distress and taking these measures rather than directly related to them being inherently more problematic for her cognitively.  In any event, the most likely culprit for some of her subjective symptoms are likely to be psychiatric in nature specifically significant increases in anxiety and exacerbation and worsening of her panic disorder.  We cannot rule out particular cerebrovascular events but those would need to be confirmed or disproven by neuro imaging.  As far as treatment recommendations, I do think that the most important variable that needs to be addressed is her significant increase in panic attacks and anxiety symptoms.  She has been prescribed alprazolam previously and was also taking Pristiq at least in 2022.  I am not sure if she is continuing to take any particular SSRI medication.  As Pristiq is more of an SNRI type medication if she is continuing to take that with increasing and worsening panic disorder I would suggest possibly considering a more focused SSRI strategy with medicines such as Prozac or Lexapro.  The patient would also likely benefit from psychotherapeutic interventions around building better coping skills to manage her anxiety disorder and find ways to reduce the frequency, it intensity and duration of panic events.  We should also follow through with the MRI studies.  I will sit down with the patient and go over the results of the current neuropsychological evaluation as well is making them available to her referring physician and available in her EMR for her other treatment team members.   Diagnosis:                                Generalized anxiety disorder   Panic attack as reaction  to stress   Memory loss   Obstructive sleep apnea syndrome   REM behavioral disorder     _____________________ Ilean Skill, Psy.D. Clinical Neuropsychologist

## 2021-07-31 DIAGNOSIS — R309 Painful micturition, unspecified: Secondary | ICD-10-CM | POA: Diagnosis not present

## 2021-07-31 DIAGNOSIS — E876 Hypokalemia: Secondary | ICD-10-CM | POA: Diagnosis not present

## 2021-07-31 DIAGNOSIS — F419 Anxiety disorder, unspecified: Secondary | ICD-10-CM | POA: Diagnosis not present

## 2021-07-31 DIAGNOSIS — R350 Frequency of micturition: Secondary | ICD-10-CM | POA: Diagnosis not present

## 2021-08-01 DIAGNOSIS — F331 Major depressive disorder, recurrent, moderate: Secondary | ICD-10-CM | POA: Diagnosis not present

## 2021-08-05 DIAGNOSIS — E559 Vitamin D deficiency, unspecified: Secondary | ICD-10-CM | POA: Diagnosis not present

## 2021-08-05 DIAGNOSIS — E039 Hypothyroidism, unspecified: Secondary | ICD-10-CM | POA: Diagnosis not present

## 2021-08-05 DIAGNOSIS — E119 Type 2 diabetes mellitus without complications: Secondary | ICD-10-CM | POA: Diagnosis not present

## 2021-08-06 ENCOUNTER — Ambulatory Visit (INDEPENDENT_AMBULATORY_CARE_PROVIDER_SITE_OTHER): Payer: Medicare HMO

## 2021-08-06 ENCOUNTER — Other Ambulatory Visit: Payer: Self-pay | Admitting: Podiatry

## 2021-08-06 ENCOUNTER — Ambulatory Visit (INDEPENDENT_AMBULATORY_CARE_PROVIDER_SITE_OTHER): Payer: Medicare HMO | Admitting: Podiatry

## 2021-08-06 DIAGNOSIS — M79671 Pain in right foot: Secondary | ICD-10-CM

## 2021-08-06 DIAGNOSIS — M722 Plantar fascial fibromatosis: Secondary | ICD-10-CM | POA: Diagnosis not present

## 2021-08-11 DIAGNOSIS — F410 Panic disorder [episodic paroxysmal anxiety] without agoraphobia: Secondary | ICD-10-CM | POA: Diagnosis not present

## 2021-08-11 DIAGNOSIS — F331 Major depressive disorder, recurrent, moderate: Secondary | ICD-10-CM | POA: Diagnosis not present

## 2021-08-12 NOTE — Progress Notes (Signed)
Subjective:  Patient ID: Danielle [name], female    DOB: 1945/11/30,  MRN: [id number]  Chief Complaint  Patient presents with   Foot Pain    76 y.o. female presents with the above complaint.  Patient presents with complain right heel pain that is going on for quite some time sharp pain on the bottom of the foot.  It is burning sensation.  She states the left side is much worse than right side shape.  When she went to get it evaluated pain scale 7 out of 10 hurts with taking for step in the morning.  She is a diabetic.   Review of Systems: Negative except as noted in the HPI. Denies N/V/F/Ch.  Past Medical History:  Diagnosis Date   Arthritis 02-13-11   osteoarthritis, hips, knees,s/p Cervical fusion(DDD)   Atherosclerosis of aorta ([place]) 05/30/2015   on CTA chest   CAD (coronary artery disease)    a. 09/2016: Coronary CT showing mid-LAD plaque with 20-50% associated stenosis but no significant stenosis by FFR analysis.    Depression with anxiety    Diabetes mellitus without complication ([place])    Difficult intubation 02-13-11   with surgery -multiple times, no problems with recent surgeries   GERD (gastroesophageal reflux disease) 02-13-11   tx. Omeprazole   Glaucoma 02-13-11   bil. tx. eye drops daily   Headache(784.0) 02-13-11   past hx. migraines, none recent   Heart murmur 02-13-11   was told-no issues with this   Hypercholesterolemia    Hypertension 02/13/2011   Hypothyroidism 02-13-11   tx. with Levothyroxine   Mitral regurgitation 06/01/2015   Mild   Sinus drainage 02-13-11   uses Zyrtec as needed   Sleep apnea 02-13-11   Hx. sleep apnea-uses cpap since 10'12 nightly    Current Outpatient Medications:    Cholecalciferol (VITAMIN D3) 25 MCG (1000 UT) CAPS, 1 capsule, Disp: , Rfl:    ALPRAZolam (XANAX) 0.25 MG tablet, Take 0.25 mg by mouth daily as needed., Disp: , Rfl:    ALPRAZolam (XANAX) 0.25 MG tablet, Take 1-2 tabs (0.'25mg'$ -0.'50mg'$ ) 30-60 minutes before procedure. May repeat  if needed.Do not drive., Disp: 4 tablet, Rfl: 0   aspirin EC 81 MG tablet, Take 1 tablet (81 mg total) by mouth daily., Disp: , Rfl:    Biotin 5000 MCG CAPS, Take 1 capsule (5,000 mcg total) by mouth 1 day or 1 dose., Disp: 30 capsule, Rfl: 0   CALCIUM-VITAMIN D PO, Take 1 capsule by mouth daily. 1200-1000 calcium-vitamin d, Disp: , Rfl:    clonazePAM (KLONOPIN) 1 MG tablet, , Disp: , Rfl:    desvenlafaxine (PRISTIQ) 100 MG 24 hr tablet, Take 100 mg by mouth daily., Disp: , Rfl:    desvenlafaxine (PRISTIQ) 50 MG 24 hr tablet, Take 50 mg by mouth daily. Takes with '100mg'$  tablet (total '150mg'$  daily), Disp: , Rfl:    fluticasone (FLONASE) 50 MCG/ACT nasal spray, 2 spray in each nostril, Disp: , Rfl:    hydrochlorothiazide (HYDRODIURIL) 25 MG tablet, Take 25 mg by mouth daily., Disp: , Rfl:    hydrOXYzine (ATARAX) 10 MG tablet, , Disp: , Rfl:    hydrOXYzine (ATARAX) 25 MG tablet, Take 1 tablet (25 mg total) by mouth every 6 (six) hours as needed for anxiety., Disp: 12 tablet, Rfl: 0   lamoTRIgine (LAMICTAL) 150 MG tablet, Take 150 mg by mouth daily., Disp: , Rfl:    levothyroxine (SYNTHROID, LEVOTHROID) 137 MCG tablet, Take 137 mcg by mouth daily before breakfast., Disp: ,  Rfl:    losartan (COZAAR) 50 MG tablet, Take 50 mg by mouth daily., Disp: , Rfl:    Magnesium 100 MG CAPS, Take 100 mg by mouth daily., Disp: , Rfl:    metFORMIN (GLUCOPHAGE-XR) 500 MG 24 hr tablet, Take 500 mg by mouth daily with breakfast., Disp: , Rfl:    metoprolol tartrate (LOPRESSOR) 25 MG tablet, TAKE 1/2 TO 1 TABLET IN THE MORNING AND AT BEDTIME FOR PALPITATIONS. NEED APPOINTMENT FOR FURTHER REFILLS, Disp: 15 tablet, Rfl: 0   ondansetron (ZOFRAN-ODT) 4 MG disintegrating tablet, Take 1 tablet (4 mg total) by mouth every 8 (eight) hours as needed for nausea or vomiting., Disp: 20 tablet, Rfl: 0   oxyCODONE (OXY IR/ROXICODONE) 5 MG immediate release tablet, Take 1 tablet (5 mg total) by mouth every 6 (six) hours as needed for  severe pain., Disp: 10 tablet, Rfl: 0   pantoprazole (PROTONIX) 40 MG tablet, Take 1 tablet (40 mg total) by mouth 2 (two) times daily. (Patient taking differently: Take 40 mg by mouth daily.), Disp: 60 tablet, Rfl: 0   rosuvastatin (CRESTOR) 40 MG tablet, Take 1 tablet (40 mg total) by mouth daily., Disp: 90 tablet, Rfl: 3   Vitamin D, Ergocalciferol, (DRISDOL) 1.25 MG (50000 UNIT) CAPS capsule, Take 50,000 Units by mouth once a week., Disp: , Rfl:   Social History   Tobacco Use  Smoking Status Never  Smokeless Tobacco Never    Allergies  Allergen Reactions   Ceftin [Cefuroxime Axetil] Hives and Swelling    Swelling of face   Codeine Nausea Only   Compazine [Prochlorperazine] Swelling and Other (See Comments)    Face and tongue swelling   Objective:  There were no vitals filed for this visit. There is no height or weight on file to calculate BMI. Constitutional Well developed. Well nourished.  Vascular Dorsalis pedis pulses palpable bilaterally. Posterior tibial pulses palpable bilaterally. Capillary refill normal to all digits.  No cyanosis or clubbing noted. Pedal hair growth normal.  Neurologic Normal speech. Oriented to person, place, and time. Epicritic sensation to light touch grossly present bilaterally.  Dermatologic Nails well groomed and normal in appearance. No open wounds. No skin lesions.  Orthopedic: Normal joint ROM without pain or crepitus bilaterally. No visible deformities. Tender to palpation at the calcaneal tuber right No pain with calcaneal squeeze right Ankle ROM diminished range of motion right Sil[place]old Test: positive left.   Radiographs: Taken and reviewed. No acute fractures or dislocations. No evidence of stress fracture.  Plantar heel spur present. Posterior heel spur absent.  Pes planovalgus foot structure  Assessment:   1. Plantar fasciitis of right foot    Plan:  Patient was evaluated and treated and all questions  answered.  Plantar Fasciitis, right - XR reviewed as above.  - Educated on icing and stretching. Instructions given.  - Injection delivered to the plantar fascia as below. - DME: Plantar fascial brace dispensed to support the medial longitudinal arch of the foot and offload pressure from the heel and prevent arch collapse during weightbearing - Pharmacologic management: None  Procedure: Injection Tendon/Ligament Location: Right plantar fascia at the glabrous junction; medial approach. Skin Prep: alcohol Injectate: 0.5 cc 0.5% marcaine plain, 0.5 cc of 1% Lidocaine, 0.5 cc kenalog 10. Disposition: Patient tolerated procedure well. Injection site dressed with a band-aid.  No follow-ups on file.

## 2021-08-13 DIAGNOSIS — F331 Major depressive disorder, recurrent, moderate: Secondary | ICD-10-CM | POA: Diagnosis not present

## 2021-08-21 ENCOUNTER — Ambulatory Visit: Payer: Medicare HMO | Admitting: Psychology

## 2021-08-22 DIAGNOSIS — F331 Major depressive disorder, recurrent, moderate: Secondary | ICD-10-CM | POA: Diagnosis not present

## 2021-09-04 ENCOUNTER — Ambulatory Visit: Payer: Self-pay | Admitting: Neurology

## 2021-09-04 ENCOUNTER — Telehealth: Payer: Self-pay | Admitting: Neurology

## 2021-09-04 NOTE — Telephone Encounter (Signed)
LVM and sent mychart msg informing pt of cancellation of today's visit due to Dr. Jaynee Eagles leaving early. If pt calls back, [facility] Roch has openings on 8/28 and 8/29 if pt is available

## 2021-09-22 DIAGNOSIS — G4754 Parasomnia in conditions classified elsewhere: Secondary | ICD-10-CM | POA: Diagnosis not present

## 2021-09-22 DIAGNOSIS — N3000 Acute cystitis without hematuria: Secondary | ICD-10-CM | POA: Diagnosis not present

## 2021-09-22 DIAGNOSIS — E119 Type 2 diabetes mellitus without complications: Secondary | ICD-10-CM | POA: Diagnosis not present

## 2021-09-22 DIAGNOSIS — R110 Nausea: Secondary | ICD-10-CM | POA: Diagnosis not present

## 2021-09-22 DIAGNOSIS — F3341 Major depressive disorder, recurrent, in partial remission: Secondary | ICD-10-CM | POA: Diagnosis not present

## 2021-09-24 ENCOUNTER — Emergency Department (HOSPITAL_BASED_OUTPATIENT_CLINIC_OR_DEPARTMENT_OTHER)
Admission: EM | Admit: 2021-09-24 | Discharge: 2021-09-24 | Disposition: A | Discharge status: Home / Self Care | Payer: Medicare HMO | Attending: Emergency Medicine | Admitting: Emergency Medicine

## 2021-09-24 ENCOUNTER — Encounter (HOSPITAL_BASED_OUTPATIENT_CLINIC_OR_DEPARTMENT_OTHER): Payer: Self-pay | Admitting: Emergency Medicine

## 2021-09-24 ENCOUNTER — Other Ambulatory Visit: Payer: Self-pay

## 2021-09-24 DIAGNOSIS — Z79899 Other long term (current) drug therapy: Secondary | ICD-10-CM | POA: Diagnosis not present

## 2021-09-24 DIAGNOSIS — Z7984 Long term (current) use of oral hypoglycemic drugs: Secondary | ICD-10-CM | POA: Diagnosis not present

## 2021-09-24 DIAGNOSIS — F0390 Unspecified dementia without behavioral disturbance: Secondary | ICD-10-CM | POA: Diagnosis not present

## 2021-09-24 DIAGNOSIS — N390 Urinary tract infection, site not specified: Secondary | ICD-10-CM | POA: Insufficient documentation

## 2021-09-24 DIAGNOSIS — I2510 Atherosclerotic heart disease of native coronary artery without angina pectoris: Secondary | ICD-10-CM | POA: Insufficient documentation

## 2021-09-24 DIAGNOSIS — E119 Type 2 diabetes mellitus without complications: Secondary | ICD-10-CM | POA: Insufficient documentation

## 2021-09-24 DIAGNOSIS — E039 Hypothyroidism, unspecified: Secondary | ICD-10-CM | POA: Insufficient documentation

## 2021-09-24 DIAGNOSIS — I10 Essential (primary) hypertension: Secondary | ICD-10-CM | POA: Insufficient documentation

## 2021-09-24 DIAGNOSIS — Z87891 Personal history of nicotine dependence: Secondary | ICD-10-CM | POA: Diagnosis not present

## 2021-09-24 DIAGNOSIS — R102 Pelvic and perineal pain: Secondary | ICD-10-CM | POA: Diagnosis present

## 2021-09-24 HISTORY — DX: Unspecified dementia, unspecified severity, without behavioral disturbance, psychotic disturbance, mood disturbance, and anxiety: F03.90

## 2021-09-24 LAB — BASIC METABOLIC PANEL
Anion gap: 9 (ref 5–15)
BUN: 9 mg/dL (ref 8–23)
CO2: 23 mmol/L (ref 22–32)
Calcium: 9.4 mg/dL (ref 8.9–10.3)
Chloride: 109 mmol/L (ref 98–111)
Creatinine, Ser: 0.95 mg/dL (ref 0.44–1.00)
GFR, Estimated: >60 mL/min (ref >60)
Glucose, Bld: 115 mg/dL — ABNORMAL HIGH (ref 70–99)
Potassium: 3 mmol/L — ABNORMAL LOW (ref 3.5–5.1)
Sodium: 141 mmol/L (ref 135–145)

## 2021-09-24 LAB — URINALYSIS, ROUTINE W REFLEX MICROSCOPIC
Bilirubin Urine: NEGATIVE
Glucose, UA: NEGATIVE mg/dL
Hgb urine dipstick: NEGATIVE
Ketones, ur: NEGATIVE mg/dL
Nitrite: NEGATIVE
Protein, ur: NEGATIVE mg/dL
Specific Gravity, Urine: 1.01 (ref 1.005–1.030)
pH: 7 (ref 5.0–8.0)

## 2021-09-24 LAB — URINALYSIS, MICROSCOPIC (REFLEX)

## 2021-09-24 LAB — CBC WITH DIFFERENTIAL/PLATELET
Abs Immature Granulocytes: 0.02 10*3/uL (ref 0.00–0.07)
Basophils Absolute: 0 10*3/uL (ref 0.0–0.1)
Basophils Relative: 1 %
Eosinophils Absolute: 0.2 10*3/uL (ref 0.0–0.5)
Eosinophils Relative: 3 %
HCT: 41.3 % (ref 36.0–46.0)
Hemoglobin: 14.3 g/dL (ref 12.0–15.0)
Immature Granulocytes: 0 %
Lymphocytes Relative: 35 %
Lymphs Abs: 2.1 10*3/uL (ref 0.7–4.0)
MCH: 29.9 pg (ref 26.0–34.0)
MCHC: 34.6 g/dL (ref 30.0–36.0)
MCV: 86.4 fL (ref 80.0–100.0)
Monocytes Absolute: 0.7 10*3/uL (ref 0.1–1.0)
Monocytes Relative: 11 %
Neutro Abs: 3.1 10*3/uL (ref 1.7–7.7)
Neutrophils Relative %: 50 %
Platelets: 253 10*3/uL (ref 150–400)
RBC: 4.78 MIL/uL (ref 3.87–5.11)
RDW: 13.1 % (ref 11.5–15.5)
WBC: 6.1 10*3/uL (ref 4.0–10.5)
nRBC: 0 % (ref 0.0–0.2)

## 2021-09-24 MED ORDER — CIPROFLOXACIN HCL 500 MG PO TABS: 500.0000 mg | ORAL_TABLET | Freq: Two times a day (BID) | ORAL | 0 refills | Status: DC

## 2021-09-24 MED ORDER — POTASSIUM CHLORIDE CRYS ER 20 MEQ PO TBCR
40.0000 meq | EXTENDED_RELEASE_TABLET | Freq: Once | ORAL | Status: AC
  Administered 2021-09-24: 40 meq via ORAL
  Filled 2021-09-24: qty 2

## 2021-09-24 MED ORDER — SODIUM CHLORIDE 0.9 % IV SOLN: 12.5000 mg | Freq: Once | INTRAVENOUS | Status: DC

## 2021-09-24 MED ORDER — ONDANSETRON HCL 4 MG/2ML IJ SOLN
INTRAMUSCULAR | Status: AC
  Administered 2021-09-24: 4 mg via INTRAVENOUS
  Filled 2021-09-24: qty 2

## 2021-09-24 MED ORDER — CIPROFLOXACIN IN D5W 400 MG/200ML IV SOLN
400.0000 mg | Freq: Once | INTRAVENOUS | Status: AC
  Administered 2021-09-24: 400 mg via INTRAVENOUS
  Filled 2021-09-24: qty 200

## 2021-09-24 MED ORDER — ONDANSETRON HCL 4 MG/2ML IJ SOLN: 4.0000 mg | Freq: Once | INTRAMUSCULAR | Status: AC

## 2021-09-24 NOTE — ED Notes (Signed)
Spoke with spouse and let him know pt is up for d/c and awaiting IVF to finish. States he will be here shortly.

## 2021-09-24 NOTE — ED Notes (Signed)
Pt is very anxious and tearful; pt asking this RN to call her spouse and thinks that he is lost. Attempted to call spouse and unable to get a hold of him at this time. Pt asking this RN about psych drugs that she has at home and is not making complete sense in regards to what she needs. Pt frustrated and states never mind when attempting to ask for more information.

## 2021-09-24 NOTE — ED Provider Notes (Signed)
[facility] DEPT [facility] Provider Note: Danielle Spurling, MD, FACEP  CSN: [id number] MRN: [id number] ARRIVAL: 09/24/21 at [place]: [place]  Pelvic Pain  Level 5 caveat: Dementia HISTORY OF PRESENT ILLNESS  09/24/21 3:51 AM Danielle [name] is a 76 y.o. female who has had cognitive decline and memory difficulties for about the past 2 years.  She has a history of sleep apnea and saw her primary care physician, Dr. Effie Shy, yesterday regarding CPAP settings.  The patient cannot recall what happened at that visit but discharge papers indicate she was having hot flashes and nausea and was suspected of having a urinary tract infection.  Urine dipstick was ordered and apparently she was diagnosed with a urinary tract infection.  A prescription for ciprofloxacin was called in but she states her husband had difficulty finding a pharmacy that had it in stock.  She believes he got the prescription filled yesterday evening but she does not believe she has taken any yet.    She is here this morning with pelvic pain, urinary urgency, urinary frequency, burning with urination, and voiding small amounts.  She is also having generalized back pain, subjective fever and chills.  She rates her pelvic pain as a 5 out of 10 and describes it as an aching sensation.   Past Medical History:  Diagnosis Date   Arthritis 02/13/2011   osteoarthritis, hips, knees,s/p Cervical fusion(DDD)   Atherosclerosis of aorta ([place]) 05/30/2015   on CTA chest   CAD (coronary artery disease)    a. 09/2016: Coronary CT showing mid-LAD plaque with 20-50% associated stenosis but no significant stenosis by FFR analysis.    Dementia ([place])    Depression with anxiety    Diabetes mellitus without complication ([place])    Difficult intubation 02/13/2011   with surgery -multiple times, no problems with recent surgeries   GERD (gastroesophageal reflux disease) 02/13/2011   tx. Omeprazole   Glaucoma 02/13/2011    bil. tx. eye drops daily   Headache(784.0) 02/13/2011   past hx. migraines, none recent   Heart murmur 02/13/2011   was told-no issues with this   Hypercholesterolemia    Hypertension 02/13/2011   Hypothyroidism 02/13/2011   tx. with Levothyroxine   Mitral regurgitation 06/01/2015   Mild   Sinus drainage 02/13/2011   uses Zyrtec as needed   Sleep apnea 02/13/2011   Hx. sleep apnea-uses cpap since 10'12 nightly    Past Surgical History:  Procedure Laterality Date   ABDOMINAL HYSTERECTOMY  02/13/2011   APPENDECTOMY  02/13/2011   Lap. removal '92   BREAST CYST EXCISION Left    CERVICAL FUSION  02/13/2011   '92- retained hardware   CHOLECYSTECTOMY  02/13/2011   '98-lap. galbladder removal due to stones   GUM SURGERY  02/13/2011   "small mouth"   JOINT REPLACEMENT  02/13/2011   RTHA-a yr ago in [place] Left 08/15/2019   Procedure: EXCISION OF SEBACEOUS CYST LEFT BREAST;  Surgeon: Georganna Skeans, MD;  Location: [facility];  Service: General;  Laterality: Left;   TONSILLECTOMY  02/13/2011   child   TOTAL HIP ARTHROPLASTY  02/17/2011   Procedure: TOTAL HIP ARTHROPLASTY ANTERIOR APPROACH;  Surgeon: Mauri Pole, MD;  Location: [facility];  Service: Orthopedics;  Laterality: Left;   TUBAL LIGATION  02/13/2011    Family History  Problem Relation Age of Onset   Cancer Mother        lung   Breast cancer  Maternal Aunt     Social History   Tobacco Use   Smoking status: Never   Smokeless tobacco: Never  Vaping Use   Vaping Use: Never used  Substance Use Topics   Alcohol use: Yes    Comment: rare   Drug use: No    Prior to Admission medications   Medication Sig Start Date End Date Taking? Authorizing Provider  ciprofloxacin (CIPRO) 500 MG tablet Take 1 tablet (500 mg total) by mouth every 12 (twelve) hours. 09/24/21  Yes Katara Griner, MD  ALPRAZolam Duanne Moron) 0.25 MG tablet Take 0.25 mg by mouth daily as needed.    [provider]   ALPRAZolam Duanne Moron) 0.25 MG tablet Take 1-2 tabs (0.'25mg'$ -0.'50mg'$ ) 30-60 minutes before procedure. May repeat if needed.Do not drive. 12/18/20   Melvenia Beam, MD  aspirin EC 81 MG tablet Take 1 tablet (81 mg total) by mouth daily. 10/20/16   Strader, Fransisco Hertz, PA-C  Biotin 5000 MCG CAPS Take 1 capsule (5,000 mcg total) by mouth 1 day or 1 dose. 10/08/16   Nita Sells, MD  CALCIUM-VITAMIN D PO Take 1 capsule by mouth daily. 1200-1000 calcium-vitamin d    [provider]  Cholecalciferol (VITAMIN D3) 25 MCG (1000 UT) CAPS 1 capsule 05/20/21   [provider]  clonazePAM (KLONOPIN) 1 MG tablet     [provider]  desvenlafaxine (PRISTIQ) 100 MG 24 hr tablet Take 100 mg by mouth daily. 08/16/20   [provider]  desvenlafaxine (PRISTIQ) 50 MG 24 hr tablet Take 50 mg by mouth daily. Takes with '100mg'$  tablet (total '150mg'$  daily) 10/22/20   [provider]  fluticasone (FLONASE) 50 MCG/ACT nasal spray 2 spray in each nostril    [provider]  hydrochlorothiazide (HYDRODIURIL) 25 MG tablet Take 25 mg by mouth daily.    [provider]  hydrOXYzine (ATARAX) 10 MG tablet     [provider]  hydrOXYzine (ATARAX) 25 MG tablet Take 1 tablet (25 mg total) by mouth every 6 (six) hours as needed for anxiety. 07/17/21   Gareth Morgan, MD  lamoTRIgine (LAMICTAL) 150 MG tablet Take 150 mg by mouth daily. 06/05/21   [provider]  levothyroxine (SYNTHROID, LEVOTHROID) 137 MCG tablet Take 137 mcg by mouth daily before breakfast.    [provider]  losartan (COZAAR) 50 MG tablet Take 50 mg by mouth daily.    [provider]  Magnesium 100 MG CAPS Take 100 mg by mouth daily.    [provider]  metFORMIN (GLUCOPHAGE-XR) 500 MG 24 hr tablet Take 500 mg by mouth daily with breakfast.    [provider]  metoprolol tartrate (LOPRESSOR) 25 MG tablet TAKE 1/2 TO 1 TABLET IN THE MORNING AND AT  BEDTIME FOR PALPITATIONS. NEED APPOINTMENT FOR FURTHER REFILLS 03/03/21   Skeet Latch, MD  ondansetron (ZOFRAN-ODT) 4 MG disintegrating tablet Take 1 tablet (4 mg total) by mouth every 8 (eight) hours as needed for nausea or vomiting. 07/17/21   Gareth Morgan, MD  oxyCODONE (OXY IR/ROXICODONE) 5 MG immediate release tablet Take 1 tablet (5 mg total) by mouth every 6 (six) hours as needed for severe pain. 08/15/19   Georganna Skeans, MD  pantoprazole (PROTONIX) 40 MG tablet Take 1 tablet (40 mg total) by mouth 2 (two) times daily. Patient taking differently: Take 40 mg by mouth daily. 10/08/16   Nita Sells, MD  rosuvastatin (CRESTOR) 40 MG tablet Take 1 tablet (40 mg total) by mouth daily. 08/18/17  Skeet Latch, MD  Vitamin D, Ergocalciferol, (DRISDOL) 1.25 MG (50000 UNIT) CAPS capsule Take 50,000 Units by mouth once a week. 07/01/21   [provider]    Allergies Ceftin [cefuroxime axetil], Codeine, and Compazine [prochlorperazine]   REVIEW OF SYSTEMS  Level 5 caveat   PHYSICAL EXAMINATION  Initial Vital Signs Blood pressure (!) 164/87, pulse 73, temperature 97.9 F (36.6 C), temperature source Oral, resp. rate 16, height '5\' 3"'$  (1.6 m), weight 94.3 kg, SpO2 97 %.  Examination General: Well-developed, well-nourished female in no acute distress; appearance consistent with age of record HENT: normocephalic; atraumatic Eyes: Normal appearance Neck: supple Heart: regular rate and rhythm Lungs: clear to auscultation bilaterally Abdomen: soft; nondistended; suprapubic tenderness; bowel sounds present Back: Mild generalized tenderness Extremities: No deformity; full range of motion; pulses normal Neurologic: Awake, alert, confused; motor function intact in all extremities and symmetric; no facial droop Skin: Warm and dry Psychiatric: Flat affect   RESULTS  Summary of this visit's results, reviewed and interpreted by myself:   EKG  Interpretation  Date/Time:  Wednesday September 24 2021 04:53:17 EDT Ventricular Rate:  66 PR Interval:  201 QRS Duration: 150 QT Interval:  433 QTC Calculation: 454 R Axis:   20 Text Interpretation: Sinus rhythm Ventricular premature complex Sinus pause Right bundle branch block Rate is slower Confirmed by Sebastion Jun, Jenny Reichmann [phone]) on 09/24/2021 4:58:03 AM       Laboratory Studies: Results for orders placed or performed during the hospital encounter of 09/24/21 (from the past 24 hour(s))  Basic metabolic panel     Status: Abnormal   Collection Time: 09/24/21  3:53 AM  Result Value Ref Range   Sodium 141 135 - 145 mmol/L   Potassium 3.0 (L) 3.5 - 5.1 mmol/L   Chloride 109 98 - 111 mmol/L   CO2 23 22 - 32 mmol/L   Glucose, Bld 115 (H) 70 - 99 mg/dL   BUN 9 8 - 23 mg/dL   Creatinine, Ser 0.95 0.44 - 1.00 mg/dL   Calcium 9.4 8.9 - 10.3 mg/dL   GFR, Estimated >60 >60 mL/min   Anion gap 9 5 - 15  CBC with Differential     Status: None   Collection Time: 09/24/21  3:53 AM  Result Value Ref Range   WBC 6.1 4.0 - 10.5 K/uL   RBC 4.78 3.87 - 5.11 MIL/uL   Hemoglobin 14.3 12.0 - 15.0 g/dL   HCT 41.3 36.0 - 46.0 %   MCV 86.4 80.0 - 100.0 fL   MCH 29.9 26.0 - 34.0 pg   MCHC 34.6 30.0 - 36.0 g/dL   RDW 13.1 11.5 - 15.5 %   Platelets 253 150 - 400 K/uL   nRBC 0.0 0.0 - 0.2 %   Neutrophils Relative % 50 %   Neutro Abs 3.1 1.7 - 7.7 K/uL   Lymphocytes Relative 35 %   Lymphs Abs 2.1 0.7 - 4.0 K/uL   Monocytes Relative 11 %   Monocytes Absolute 0.7 0.1 - 1.0 K/uL   Eosinophils Relative 3 %   Eosinophils Absolute 0.2 0.0 - 0.5 K/uL   Basophils Relative 1 %   Basophils Absolute 0.0 0.0 - 0.1 K/uL   Immature Granulocytes 0 %   Abs Immature Granulocytes 0.02 0.00 - 0.07 K/uL  Urinalysis, Routine w reflex microscopic Urine, Clean Catch     Status: Abnormal   Collection Time: 09/24/21  4:01 AM  Result Value Ref Range   Color, Urine YELLOW YELLOW   APPearance  CLEAR CLEAR   Specific  Gravity, Urine 1.010 1.005 - 1.030   pH 7.0 5.0 - 8.0   Glucose, UA NEGATIVE NEGATIVE mg/dL   Hgb urine dipstick NEGATIVE NEGATIVE   Bilirubin Urine NEGATIVE NEGATIVE   Ketones, ur NEGATIVE NEGATIVE mg/dL   Protein, ur NEGATIVE NEGATIVE mg/dL   Nitrite NEGATIVE NEGATIVE   Leukocytes,Ua MODERATE (A) NEGATIVE  Urinalysis, Microscopic (reflex)     Status: Abnormal   Collection Time: 09/24/21  4:01 AM  Result Value Ref Range   RBC / HPF 0-5 0 - 5 RBC/hpf   WBC, UA 11-20 0 - 5 WBC/hpf   Bacteria, UA RARE (A) NONE SEEN   Squamous Epithelial / LPF 0-5 0 - 5   Imaging Studies: No results found.  ED COURSE and MDM  Nursing notes, initial and subsequent vitals signs, including pulse oximetry, reviewed and interpreted by myself.  Vitals:   09/24/21 0345 09/24/21 0346  BP: (!) 164/87   Pulse: 73   Resp: 16   Temp: 97.9 F (36.6 C)   TempSrc: Oral   SpO2: 97%   Weight:  94.3 kg  Height:  '5\' 3"'$  (1.6 m)   Medications  ciprofloxacin (CIPRO) IVPB 400 mg (400 mg Intravenous New Bag/Given 09/24/21 0427)  potassium chloride SA (KLOR-CON M) CR tablet 40 mEq (40 mEq Oral Given 09/24/21 0430)  ondansetron (ZOFRAN) injection 4 mg (4 mg Intravenous Given 09/24/21 0459)   4:22 AM Urinalysis consistent with a urinary tract infection.  We will give IV Cipro (patient has a cephalosporin allergy) and have patient start/continue her oral ciprofloxacin at home.   PROCEDURES  Procedures   ED DIAGNOSES     ICD-10-CM   1. Lower urinary tract infection  N39.0          Rayetta Veith, MD 09/24/21 [phone]

## 2021-09-24 NOTE — ED Triage Notes (Signed)
Pt dx with UTI and given abx rx. Pt did not take her medications and is having continued pressure in the groin with discomfort while urinating. No fevers; no diarrhea.

## 2021-09-25 ENCOUNTER — Ambulatory Visit: Payer: Self-pay | Admitting: Neurology

## 2021-09-25 LAB — URINE CULTURE

## 2021-09-27 ENCOUNTER — Emergency Department (HOSPITAL_BASED_OUTPATIENT_CLINIC_OR_DEPARTMENT_OTHER)
Admission: EM | Admit: 2021-09-27 | Discharge: 2021-09-28 | Disposition: A | Discharge status: Home / Self Care | Payer: Medicare HMO | Attending: Emergency Medicine | Admitting: Emergency Medicine

## 2021-09-27 ENCOUNTER — Emergency Department (HOSPITAL_BASED_OUTPATIENT_CLINIC_OR_DEPARTMENT_OTHER): Payer: Medicare HMO

## 2021-09-27 ENCOUNTER — Encounter (HOSPITAL_BASED_OUTPATIENT_CLINIC_OR_DEPARTMENT_OTHER): Payer: Self-pay | Admitting: Emergency Medicine

## 2021-09-27 ENCOUNTER — Other Ambulatory Visit: Payer: Self-pay

## 2021-09-27 DIAGNOSIS — E039 Hypothyroidism, unspecified: Secondary | ICD-10-CM | POA: Diagnosis not present

## 2021-09-27 DIAGNOSIS — E876 Hypokalemia: Secondary | ICD-10-CM | POA: Diagnosis not present

## 2021-09-27 DIAGNOSIS — R102 Pelvic and perineal pain: Secondary | ICD-10-CM | POA: Diagnosis present

## 2021-09-27 DIAGNOSIS — R1084 Generalized abdominal pain: Secondary | ICD-10-CM | POA: Diagnosis not present

## 2021-09-27 DIAGNOSIS — I10 Essential (primary) hypertension: Secondary | ICD-10-CM | POA: Diagnosis not present

## 2021-09-27 DIAGNOSIS — I2510 Atherosclerotic heart disease of native coronary artery without angina pectoris: Secondary | ICD-10-CM | POA: Diagnosis not present

## 2021-09-27 DIAGNOSIS — E119 Type 2 diabetes mellitus without complications: Secondary | ICD-10-CM | POA: Insufficient documentation

## 2021-09-27 DIAGNOSIS — Z7982 Long term (current) use of aspirin: Secondary | ICD-10-CM | POA: Diagnosis not present

## 2021-09-27 DIAGNOSIS — N898 Other specified noninflammatory disorders of vagina: Secondary | ICD-10-CM | POA: Diagnosis not present

## 2021-09-27 DIAGNOSIS — K5730 Diverticulosis of large intestine without perforation or abscess without bleeding: Secondary | ICD-10-CM | POA: Diagnosis not present

## 2021-09-27 LAB — CBC WITH DIFFERENTIAL/PLATELET
Abs Immature Granulocytes: 0.02 10*3/uL (ref 0.00–0.07)
Basophils Absolute: 0 10*3/uL (ref 0.0–0.1)
Basophils Relative: 1 %
Eosinophils Absolute: 0.2 10*3/uL (ref 0.0–0.5)
Eosinophils Relative: 3 %
HCT: 42.2 % (ref 36.0–46.0)
Hemoglobin: 14.2 g/dL (ref 12.0–15.0)
Immature Granulocytes: 0 %
Lymphocytes Relative: 37 %
Lymphs Abs: 2.5 10*3/uL (ref 0.7–4.0)
MCH: 29.9 pg (ref 26.0–34.0)
MCHC: 33.6 g/dL (ref 30.0–36.0)
MCV: 88.8 fL (ref 80.0–100.0)
Monocytes Absolute: 0.8 10*3/uL (ref 0.1–1.0)
Monocytes Relative: 12 %
Neutro Abs: 3.2 10*3/uL (ref 1.7–7.7)
Neutrophils Relative %: 47 %
Platelets: 226 10*3/uL (ref 150–400)
RBC: 4.75 MIL/uL (ref 3.87–5.11)
RDW: 13.2 % (ref 11.5–15.5)
WBC: 6.8 10*3/uL (ref 4.0–10.5)
nRBC: 0 % (ref 0.0–0.2)

## 2021-09-27 LAB — BASIC METABOLIC PANEL
Anion gap: 6 (ref 5–15)
BUN: 6 mg/dL — ABNORMAL LOW (ref 8–23)
CO2: 26 mmol/L (ref 22–32)
Calcium: 9 mg/dL (ref 8.9–10.3)
Chloride: 107 mmol/L (ref 98–111)
Creatinine, Ser: 1.1 mg/dL — ABNORMAL HIGH (ref 0.44–1.00)
GFR, Estimated: 52 mL/min — ABNORMAL LOW (ref >60)
Glucose, Bld: 98 mg/dL (ref 70–99)
Potassium: 3 mmol/L — ABNORMAL LOW (ref 3.5–5.1)
Sodium: 139 mmol/L (ref 135–145)

## 2021-09-27 MED ORDER — POTASSIUM CHLORIDE CRYS ER 20 MEQ PO TBCR
40.0000 meq | EXTENDED_RELEASE_TABLET | Freq: Once | ORAL | Status: AC
  Administered 2021-09-27: 40 meq via ORAL
  Filled 2021-09-27: qty 2

## 2021-09-27 MED ORDER — IOHEXOL 300 MG/ML  SOLN
100.0000 mL | Freq: Once | INTRAMUSCULAR | Status: AC | PRN
  Administered 2021-09-27: 100 mL via INTRAVENOUS

## 2021-09-27 MED ORDER — POTASSIUM CHLORIDE CRYS ER 20 MEQ PO TBCR: 20.0000 meq | EXTENDED_RELEASE_TABLET | Freq: Two times a day (BID) | ORAL | 0 refills | Status: DC

## 2021-09-27 NOTE — ED Notes (Signed)
Late entry -- Nurse to bedside with Dr. Rogene Houston for pelvic exam -- mild erythema; no obvious edema with very small flesh tone raised area proximal to R labia and distally/laterally to L labia; no discharge noted.  Plan for labs to be collected and CT abd/pelvis; pt agreeable with plan - will monitor for acute changes and maintain plan of care.

## 2021-09-27 NOTE — ED Provider Notes (Signed)
[facility] EMERGENCY DEPARTMENT Provider Note   CSN: [id number] Arrival date & time: 09/27/21  1831     History  Chief Complaint  Patient presents with   Vaginal Pain    Danielle [name] is a 76 y.o. female.  Patient presenting with several complaints.  Patient complaining of generalized abdominal pain may be some constipation also stating that she is got labial swelling vaginal discomfort.  Patient was seen in the emergency department September 13 diagnosed with urinary tract infection started on Cipro urine culture not specific for UTI kind of mixed bacterial picture.  Patient denies any pain in the labial area but talks about kind of itching irritation them when she pees some burning pain.  No fevers no nausea or vomiting.  Chart review shows that at last visit patient had some memory problems.  Patient's husband currently not here.  Past medical history significant for heart murmur glaucoma hypothyroidism hypertension hypercholesterol depression anxiety mitral regurgitation coronary artery disease diabetes without complications.  Past surgical history significant for abdominal hysterectomy appendectomy and cholecystectomy.       Home Medications Prior to Admission medications   Medication Sig Start Date End Date Taking? Authorizing Provider  potassium chloride SA (KLOR-CON M) 20 MEQ tablet Take 1 tablet (20 mEq total) by mouth 2 (two) times daily. 09/27/21  Yes Fredia Sorrow, MD  ALPRAZolam Duanne Moron) 0.25 MG tablet Take 0.25 mg by mouth daily as needed.    [provider]  ALPRAZolam Duanne Moron) 0.25 MG tablet Take 1-2 tabs (0.'25mg'$ -0.'50mg'$ ) 30-60 minutes before procedure. May repeat if needed.Do not drive. 12/18/20   Melvenia Beam, MD  aspirin EC 81 MG tablet Take 1 tablet (81 mg total) by mouth daily. 10/20/16   Strader, Fransisco Hertz, PA-C  Biotin 5000 MCG CAPS Take 1 capsule (5,000 mcg total) by mouth 1 day or 1 dose. 10/08/16   Nita Sells, MD   CALCIUM-VITAMIN D PO Take 1 capsule by mouth daily. 1200-1000 calcium-vitamin d    [provider]  Cholecalciferol (VITAMIN D3) 25 MCG (1000 UT) CAPS 1 capsule 05/20/21   [provider]  ciprofloxacin (CIPRO) 500 MG tablet Take 1 tablet (500 mg total) by mouth every 12 (twelve) hours. 09/24/21   Molpus, John, MD  clonazePAM (KLONOPIN) 1 MG tablet     [provider]  desvenlafaxine (PRISTIQ) 100 MG 24 hr tablet Take 100 mg by mouth daily. 08/16/20   [provider]  desvenlafaxine (PRISTIQ) 50 MG 24 hr tablet Take 50 mg by mouth daily. Takes with '100mg'$  tablet (total '150mg'$  daily) 10/22/20   [provider]  fluticasone (FLONASE) 50 MCG/ACT nasal spray 2 spray in each nostril    [provider]  hydrochlorothiazide (HYDRODIURIL) 25 MG tablet Take 25 mg by mouth daily.    [provider]  hydrOXYzine (ATARAX) 10 MG tablet     [provider]  hydrOXYzine (ATARAX) 25 MG tablet Take 1 tablet (25 mg total) by mouth every 6 (six) hours as needed for anxiety. 07/17/21   Gareth Morgan, MD  lamoTRIgine (LAMICTAL) 150 MG tablet Take 150 mg by mouth daily. 06/05/21   [provider]  levothyroxine (SYNTHROID, LEVOTHROID) 137 MCG tablet Take 137 mcg by mouth daily before breakfast.    [provider]  losartan (COZAAR) 50 MG tablet Take 50 mg by mouth daily.    [provider]  Magnesium 100 MG CAPS Take 100 mg by mouth daily.    [provider]  metFORMIN (  GLUCOPHAGE-XR) 500 MG 24 hr tablet Take 500 mg by mouth daily with breakfast.    [provider]  metoprolol tartrate (LOPRESSOR) 25 MG tablet TAKE 1/2 TO 1 TABLET IN THE MORNING AND AT BEDTIME FOR PALPITATIONS. NEED APPOINTMENT FOR FURTHER REFILLS 03/03/21   Skeet Latch, MD  ondansetron (ZOFRAN-ODT) 4 MG disintegrating tablet Take 1 tablet (4 mg total) by mouth every 8 (eight) hours as needed for nausea or vomiting. 07/17/21   Gareth Morgan, MD  oxyCODONE (OXY IR/ROXICODONE) 5 MG immediate release tablet Take 1 tablet (5 mg total) by mouth every 6 (six) hours as needed for severe pain. 08/15/19   Georganna Skeans, MD  pantoprazole (PROTONIX) 40 MG tablet Take 1 tablet (40 mg total) by mouth 2 (two) times daily. Patient taking differently: Take 40 mg by mouth daily. 10/08/16   Nita Sells, MD  rosuvastatin (CRESTOR) 40 MG tablet Take 1 tablet (40 mg total) by mouth daily. 08/18/17   Skeet Latch, MD  Vitamin D, Ergocalciferol, (DRISDOL) 1.25 MG (50000 UNIT) CAPS capsule Take 50,000 Units by mouth once a week. 07/01/21   [provider]      Allergies    Ceftin [cefuroxime axetil], Codeine, and Compazine [prochlorperazine]    Review of Systems   Review of Systems  Constitutional:  Negative for chills and fever.  HENT:  Negative for ear pain and sore throat.   Eyes:  Negative for pain and visual disturbance.  Respiratory:  Negative for cough and shortness of breath.   Cardiovascular:  Negative for chest pain and palpitations.  Gastrointestinal:  Positive for abdominal pain and constipation. Negative for vomiting.  Genitourinary:  Positive for pelvic pain and vaginal pain. Negative for dysuria, hematuria, vaginal bleeding and vaginal discharge.  Musculoskeletal:  Negative for arthralgias and back pain.  Skin:  Negative for color change and rash.  Neurological:  Negative for seizures and syncope.  All other systems reviewed and are negative.   Physical Exam Updated Vital Signs BP (!) 136/48   Pulse 64   Temp 97.7 F (36.5 C) (Oral)   Resp 17   SpO2 96%  Physical Exam Vitals and nursing note reviewed.  Constitutional:      General: She is not in acute distress.    Appearance: Normal appearance. She is well-developed.  HENT:     Head: Normocephalic and atraumatic.  Eyes:     Extraocular Movements: Extraocular movements intact.     Conjunctiva/sclera: Conjunctivae normal.     Pupils: Pupils are  equal, round, and reactive to light.  Cardiovascular:     Rate and Rhythm: Normal rate and regular rhythm.     Heart sounds: No murmur heard. Pulmonary:     Effort: Pulmonary effort is normal. No respiratory distress.     Breath sounds: Normal breath sounds.  Abdominal:     General: There is no distension.     Palpations: Abdomen is soft.     Tenderness: There is no abdominal tenderness. There is no guarding.  Genitourinary:    Vagina: No vaginal discharge.     Comments: Although somewhat irritated no vesicles.  Mild labial swelling.  But no significant swelling.  No discharge. Musculoskeletal:        General: No swelling.     Cervical back: Neck supple.  Skin:    General: Skin is warm and dry.     Capillary Refill: Capillary refill takes less than 2 seconds.  Neurological:     General: No focal deficit present.  Mental Status: She is alert. Mental status is at baseline.  Psychiatric:        Mood and Affect: Mood normal.     ED Results / Procedures / Treatments   Labs (all labs ordered are listed, but only abnormal results are displayed) Labs Reviewed  BASIC METABOLIC PANEL - Abnormal; Notable for the following components:      Result Value   Potassium 3.0 (*)    BUN 6 (*)    Creatinine, Ser 1.10 (*)    GFR, Estimated 52 (*)    All other components within normal limits  CBC WITH DIFFERENTIAL/PLATELET  URINALYSIS, ROUTINE W REFLEX MICROSCOPIC    EKG None  Radiology CT Abdomen Pelvis W Contrast  Result Date: 09/27/2021 CLINICAL DATA:  Pt POV c/o vaginal pain, swollen labia x4 days. Denies discharge. Recently seen for same. Reports currently taking antibiotics for uti. Abdominal pain, acute, nonlocalized EXAM: CT ABDOMEN AND PELVIS WITH CONTRAST TECHNIQUE: Multidetector CT imaging of the abdomen and pelvis was performed using the standard protocol following bolus administration of intravenous contrast. RADIATION DOSE REDUCTION: This exam was performed according to the  departmental dose-optimization program which includes automated exposure control, adjustment of the mA and/or kV according to patient size and/or use of iterative reconstruction technique. CONTRAST:  157m OMNIPAQUE IOHEXOL 300 MG/ML  SOLN COMPARISON:  None Available. FINDINGS: Lower chest: Bilateral lower lobe subsegmental atelectasis. Aortic valve leaflet calcification. Hepatobiliary: No focal liver abnormality. Status post cholecystectomy. No biliary dilatation. Pancreas: No focal lesion. Normal pancreatic contour. No surrounding inflammatory changes. No main pancreatic ductal dilatation. Spleen: Normal in size without focal abnormality. Adrenals/Urinary Tract: No adrenal nodule bilaterally. Bilateral kidneys enhance symmetrically. No hydronephrosis. No hydroureter. Distal ureters and urinary bladder not visualized due to streak artifact originating from bilateral femoral surgical hardware. Stomach/Bowel: Stomach is within normal limits. No evidence of bowel wall thickening or dilatation. Scattered colonic diverticulosis. Status post appendectomy. Vascular/Lymphatic: No abdominal aorta or iliac aneurysm. Severe atherosclerotic plaque of the aorta and its branches. No abdominal, pelvic, or inguinal lymphadenopathy. Reproductive: Status post hysterectomy. No adnexal masses. Not well visualized due to streak artifact. Other: No intraperitoneal free fluid. No intraperitoneal free gas. No organized fluid collection. Musculoskeletal: No abdominal wall hernia or abnormality. Partially visualized labia unremarkable. No suspicious lytic or blastic osseous lesions. No acute displaced fracture. Grade 1 anterolisthesis of L4 on L5 and L5 on S1. 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 Multilevel degenerative changes of the spine. Total bilateral hip arthroplasty. IMPRESSION: 1. Partially visualized labia unremarkable. 2. Markedly limited evaluation of the pelvis due to streak artifact originating from bilateral femoral  surgical hardware. Nonvisualization of the vagina or surgical hysterectomy bed. Distal ureters and urinary bladder not visualized. 3. Colonic diverticulosis with no acute diverticulitis. 4. Aortic Atherosclerosis (ICD10-I70.0) including aortic valve leaflet calcifications-correlate with aortic stenosis. Electronically Signed   By: MIven FinnM.D.   On: 09/27/2021 23:04    Procedures Procedures    Medications Ordered in ED Medications  potassium chloride SA (KLOR-CON M) CR tablet 40 mEq (has no administration in time range)  iohexol (OMNIPAQUE) 300 MG/ML solution 100 mL (100 mLs Intravenous Contrast Given 09/27/21 2240)    ED Course/ Medical Decision Making/ A&P                           Medical Decision Making Amount and/or Complexity of Data Reviewed Labs: ordered. Radiology: ordered.  Risk Prescription drug management.   Patient's urine  culture was mixed.  No distinct bacterial urinary tract infection.  Today is some labial mucosal irritation around the vaginal area.  No evidence of any distinct yeast infection but could be a yeast infection also could be vaginal dryness.  Abdomen soft and nontender.  But based on the patient's complaint of pelvic pain abdominal pain we will going get CT abdomen and pelvis this time.  Will also recheck basic labs CBC basic metabolic panel.  If negative will probably consider treatment with Monistat and then follow-up with primary care doctor.  Also will get urinalysis again.  Additional labs from the last time showed some hypokalemia.   Patient does not want to wait and redo the urinalysis.  CT scan work-up without any acute findings.  CBC normal basic metabolic panel potassium low again at 3.0.  Gave 40 mg oral potassium here tonight.  We will continue with 20 mEq twice daily for the next 2 days.  Have her follow-up closely with her primary care doctor.  CT scan without any acute findings some limitations due to scatter imaging from her prior  bilateral femoral surgical heart hardware.  But nothing acute.  Feel that this is mostly vaginal irritation with a trial of Monistat cream.  But although no direct evidence of yeast infection.  Also could be due to vaginal dryness as well.    Final Clinical Impression(s) / ED Diagnoses Final diagnoses:  Hypokalemia  Vaginal irritation    Rx / DC Orders ED Discharge Orders          Ordered    potassium chloride SA (KLOR-CON M) 20 MEQ tablet  2 times daily        09/27/21 2331              Fredia Sorrow, MD 09/27/21 2332

## 2021-09-27 NOTE — Discharge Instructions (Signed)
Work-up here tonight without any acute findings.  Potassium still low take the potassium supplement as directed.  Make an appointment follow-up with your doctor.  Recommend using over-the-counter Monistat cream and vaginal suppositories.  Is possible there could be some yeast irritation.  If not this could be irritation secondary to some skin dryness and may require some additional treatment.

## 2021-09-27 NOTE — ED Triage Notes (Signed)
Pt POV c/o vaginal pain, swollen labia x4 days. Denies discharge. Recently seen for same. Reports currently taking antibiotics for uti.

## 2021-09-27 NOTE — ED Notes (Signed)
Pt now returned from [place]; results pending

## 2021-09-27 NOTE — ED Notes (Signed)
This nurse to verify

## 2021-09-28 LAB — URINALYSIS, MICROSCOPIC (REFLEX)

## 2021-09-28 LAB — URINALYSIS, ROUTINE W REFLEX MICROSCOPIC
Bilirubin Urine: NEGATIVE
Glucose, UA: NEGATIVE mg/dL
Ketones, ur: NEGATIVE mg/dL
Nitrite: NEGATIVE
Protein, ur: NEGATIVE mg/dL
Specific Gravity, Urine: <=1.005 (ref 1.005–1.030)
pH: 5.5 (ref 5.0–8.0)

## 2021-09-28 NOTE — ED Notes (Signed)
Spouse updated on plan for d/c home-this nurse has verbally reviewed d/c instructions with spouse and provided written copy as pt reportedly with h/o worsening memory over last 2 years.  This nurse also reviewed instructions with patient directly -- all questions answered to the satisfaction of spouse and patient.

## 2021-09-29 ENCOUNTER — Encounter (HOSPITAL_BASED_OUTPATIENT_CLINIC_OR_DEPARTMENT_OTHER): Payer: Self-pay | Admitting: Emergency Medicine

## 2021-09-29 ENCOUNTER — Other Ambulatory Visit: Payer: Self-pay

## 2021-09-29 ENCOUNTER — Emergency Department (HOSPITAL_BASED_OUTPATIENT_CLINIC_OR_DEPARTMENT_OTHER)
Admission: EM | Admit: 2021-09-29 | Discharge: 2021-09-30 | Disposition: A | Discharge status: Home / Self Care | Payer: Medicare HMO | Attending: Emergency Medicine | Admitting: Emergency Medicine

## 2021-09-29 DIAGNOSIS — R079 Chest pain, unspecified: Secondary | ICD-10-CM | POA: Insufficient documentation

## 2021-09-29 DIAGNOSIS — Z7982 Long term (current) use of aspirin: Secondary | ICD-10-CM | POA: Insufficient documentation

## 2021-09-29 DIAGNOSIS — R413 Other amnesia: Secondary | ICD-10-CM | POA: Insufficient documentation

## 2021-09-29 DIAGNOSIS — F32A Depression, unspecified: Secondary | ICD-10-CM | POA: Diagnosis not present

## 2021-09-29 DIAGNOSIS — Z79899 Other long term (current) drug therapy: Secondary | ICD-10-CM | POA: Diagnosis not present

## 2021-09-29 DIAGNOSIS — R110 Nausea: Secondary | ICD-10-CM | POA: Diagnosis not present

## 2021-09-29 DIAGNOSIS — K5900 Constipation, unspecified: Secondary | ICD-10-CM | POA: Diagnosis not present

## 2021-09-29 DIAGNOSIS — Z20822 Contact with and (suspected) exposure to covid-19: Secondary | ICD-10-CM | POA: Insufficient documentation

## 2021-09-29 DIAGNOSIS — F411 Generalized anxiety disorder: Secondary | ICD-10-CM | POA: Insufficient documentation

## 2021-09-29 DIAGNOSIS — F419 Anxiety disorder, unspecified: Secondary | ICD-10-CM | POA: Diagnosis not present

## 2021-09-29 DIAGNOSIS — R45851 Suicidal ideations: Secondary | ICD-10-CM | POA: Diagnosis not present

## 2021-09-29 DIAGNOSIS — F410 Panic disorder [episodic paroxysmal anxiety] without agoraphobia: Secondary | ICD-10-CM | POA: Insufficient documentation

## 2021-09-29 DIAGNOSIS — R0789 Other chest pain: Secondary | ICD-10-CM | POA: Diagnosis not present

## 2021-09-29 LAB — CBC
HCT: 41.9 % (ref 36.0–46.0)
Hemoglobin: 14.3 g/dL (ref 12.0–15.0)
MCH: 29.7 pg (ref 26.0–34.0)
MCHC: 34.1 g/dL (ref 30.0–36.0)
MCV: 86.9 fL (ref 80.0–100.0)
Platelets: 238 10*3/uL (ref 150–400)
RBC: 4.82 MIL/uL (ref 3.87–5.11)
RDW: 13.2 % (ref 11.5–15.5)
WBC: 6.4 10*3/uL (ref 4.0–10.5)
nRBC: 0 % (ref 0.0–0.2)

## 2021-09-29 LAB — BASIC METABOLIC PANEL
Anion gap: 9 (ref 5–15)
BUN: 6 mg/dL — ABNORMAL LOW (ref 8–23)
CO2: 21 mmol/L — ABNORMAL LOW (ref 22–32)
Calcium: 9.3 mg/dL (ref 8.9–10.3)
Chloride: 110 mmol/L (ref 98–111)
Creatinine, Ser: 0.91 mg/dL (ref 0.44–1.00)
GFR, Estimated: >60 mL/min (ref >60)
Glucose, Bld: 103 mg/dL — ABNORMAL HIGH (ref 70–99)
Potassium: 3.4 mmol/L — ABNORMAL LOW (ref 3.5–5.1)
Sodium: 140 mmol/L (ref 135–145)

## 2021-09-29 LAB — TROPONIN I (HIGH SENSITIVITY): Troponin I (High Sensitivity): 5 ng/L (ref <18)

## 2021-09-29 NOTE — ED Triage Notes (Signed)
Patient arrived via POV c/o chest pain w/ panic attack x 1 day. Patient has been seen for same recently at this facility. Patient states "feeling burning pain all over body". Patient states only medication that seems to help is xanax. Patient is AO x 4, VS WDL, wheeled in.

## 2021-09-30 ENCOUNTER — Emergency Department (HOSPITAL_BASED_OUTPATIENT_CLINIC_OR_DEPARTMENT_OTHER): Payer: Medicare HMO

## 2021-09-30 ENCOUNTER — Ambulatory Visit: Payer: Medicare HMO | Admitting: Psychology

## 2021-09-30 DIAGNOSIS — F411 Generalized anxiety disorder: Secondary | ICD-10-CM

## 2021-09-30 DIAGNOSIS — R413 Other amnesia: Secondary | ICD-10-CM

## 2021-09-30 DIAGNOSIS — F410 Panic disorder [episodic paroxysmal anxiety] without agoraphobia: Secondary | ICD-10-CM | POA: Diagnosis not present

## 2021-09-30 DIAGNOSIS — K5900 Constipation, unspecified: Secondary | ICD-10-CM | POA: Diagnosis not present

## 2021-09-30 DIAGNOSIS — F32A Depression, unspecified: Secondary | ICD-10-CM | POA: Diagnosis not present

## 2021-09-30 LAB — URINALYSIS, ROUTINE W REFLEX MICROSCOPIC
Glucose, UA: NEGATIVE mg/dL
Ketones, ur: NEGATIVE mg/dL
Leukocytes,Ua: NEGATIVE
Nitrite: NEGATIVE
Protein, ur: NEGATIVE mg/dL
Specific Gravity, Urine: >=1.03 (ref 1.005–1.030)
pH: 5 (ref 5.0–8.0)

## 2021-09-30 LAB — URINALYSIS, MICROSCOPIC (REFLEX)

## 2021-09-30 LAB — RESP PANEL BY RT-PCR (FLU A&B, COVID) ARPGX2
Influenza A by PCR: NEGATIVE
Influenza B by PCR: NEGATIVE
SARS Coronavirus 2 by RT PCR: NEGATIVE

## 2021-09-30 LAB — CBG MONITORING, ED: Glucose-Capillary: 114 mg/dL — ABNORMAL HIGH (ref 70–99)

## 2021-09-30 LAB — TROPONIN I (HIGH SENSITIVITY): Troponin I (High Sensitivity): 4 ng/L (ref <18)

## 2021-09-30 MED ORDER — ALPRAZOLAM 0.5 MG PO TABS
0.2500 mg | ORAL_TABLET | Freq: Once | ORAL | Status: AC
  Administered 2021-09-30: 0.25 mg via ORAL
  Filled 2021-09-30: qty 1

## 2021-09-30 MED ORDER — LORAZEPAM 1 MG PO TABS
1.0000 mg | ORAL_TABLET | Freq: Once | ORAL | Status: AC
  Administered 2021-09-30: 1 mg via ORAL
  Filled 2021-09-30: qty 1

## 2021-09-30 MED ORDER — LACTULOSE 10 GM/15ML PO SOLN: 20.0000 g | Freq: Once | ORAL | Status: DC

## 2021-09-30 MED ORDER — DOCUSATE SODIUM 100 MG PO CAPS: 100.0000 mg | ORAL_CAPSULE | Freq: Two times a day (BID) | ORAL | Status: DC

## 2021-09-30 MED ORDER — HYDROXYZINE HCL 25 MG PO TABS: 25.0000 mg | ORAL_TABLET | Freq: Three times a day (TID) | ORAL | Status: DC | PRN

## 2021-09-30 MED ORDER — SENNOSIDES-DOCUSATE SODIUM 8.6-50 MG PO TABS
2.0000 | ORAL_TABLET | Freq: Once | ORAL | Status: AC
  Administered 2021-09-30: 2 via ORAL
  Filled 2021-09-30: qty 2

## 2021-09-30 MED ORDER — MAGNESIUM CITRATE PO SOLN: 1.0000 | Freq: Once | ORAL | Status: DC

## 2021-09-30 MED ORDER — ONDANSETRON 4 MG PO TBDP
8.0000 mg | ORAL_TABLET | Freq: Once | ORAL | Status: AC
  Administered 2021-09-30: 8 mg via ORAL
  Filled 2021-09-30: qty 2

## 2021-09-30 MED ORDER — FLUOXETINE HCL 10 MG PO CAPS: 20.0000 mg | ORAL_CAPSULE | Freq: Every morning | ORAL | Status: DC

## 2021-09-30 MED ORDER — HYDROXYZINE HCL 25 MG PO TABS: 25.0000 mg | ORAL_TABLET | Freq: Three times a day (TID) | ORAL | 0 refills | Status: DC | PRN
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
## 2021-09-30 NOTE — ED Notes (Signed)
[place] computer pushed into room 12. Husband stepped out of room, left info with Network engineer. RN aware

## 2021-09-30 NOTE — ED Notes (Addendum)
Messaged BH to regarding plan for this patient. Pt reports increase anxiety stating she doesn't know what to do about everything going on. Pt up and ambulatory in her room due to anxiety . Dry heaves Attempting pt to redirect pt but continues to be anxious. Stating please help me EDP notified

## 2021-09-30 NOTE — ED Notes (Signed)
Pt calling out again for laxative - Dr. Stark Jock notified via secure chat

## 2021-09-30 NOTE — ED Notes (Addendum)
Late entry -- Pt continues to acknowledge suicidal ideations without actual plan; states SI "for months" associated with feelings of health not getting any better.  Pt also reports possible constipation; reports lower abd discomfort she attributes to constipation.  Assisted to bathroom via and passed small amount of loose stool but continues to have feelings of constipation - pt requesting laxative.  Pt also reports last BM approx 4 days ago - states she took a laxative at home prior to that last BM but doesn't remember name -- also states her appetite has decreased recently - states has only been tolerating PO liquids.  Will update Dr. Stark Jock via secure chat.

## 2021-09-30 NOTE — ED Notes (Signed)
Husband notified of pending discharge

## 2021-09-30 NOTE — ED Notes (Signed)
Awaiting patient to be moved to psych room by ED tech

## 2021-09-30 NOTE — ED Provider Notes (Signed)
[facility] Provider Note   CSN: [id number] Arrival date & time: 09/29/21  2107     History  Chief Complaint  Patient presents with   Chest Pain   Panic Attack    Danielle [name] is a 76 y.o. female.  Patient is a 76 year old female presenting with multiple complaints.  Patient states that she feels anxious, nauseated, and is having discomfort in her chest.  She has been having these episodes for many months.  She has been seen by her primary doctor and a psychiatrist.  She has been tried on several medications, however these feelings persist.  She tells me she has lost weight and has no appetite.  She was seen here 2 days ago with a similar complaint.  Work-up was unremarkable, but returns stating that she is not feeling any better.  She has been prescribed Xanax which has helped somewhat.  The history is provided by the patient.       Home Medications Prior to Admission medications   Medication Sig Start Date End Date Taking? Authorizing Provider  ALPRAZolam (XANAX) 0.25 MG tablet Take 0.25 mg by mouth daily as needed.    [provider]  ALPRAZolam Duanne Moron) 0.25 MG tablet Take 1-2 tabs (0.'25mg'$ -0.'50mg'$ ) 30-60 minutes before procedure. May repeat if needed.Do not drive. 12/18/20   Melvenia Beam, MD  aspirin EC 81 MG tablet Take 1 tablet (81 mg total) by mouth daily. 10/20/16   Strader, Fransisco Hertz, PA-C  Biotin 5000 MCG CAPS Take 1 capsule (5,000 mcg total) by mouth 1 day or 1 dose. 10/08/16   Nita Sells, MD  CALCIUM-VITAMIN D PO Take 1 capsule by mouth daily. 1200-1000 calcium-vitamin d    [provider]  Cholecalciferol (VITAMIN D3) 25 MCG (1000 UT) CAPS 1 capsule 05/20/21   [provider]  ciprofloxacin (CIPRO) 500 MG tablet Take 1 tablet (500 mg total) by mouth every 12 (twelve) hours. 09/24/21   Molpus, John, MD  clonazePAM (KLONOPIN) 1 MG tablet     [provider]  desvenlafaxine (PRISTIQ) 100 MG  24 hr tablet Take 100 mg by mouth daily. 08/16/20   [provider]  desvenlafaxine (PRISTIQ) 50 MG 24 hr tablet Take 50 mg by mouth daily. Takes with '100mg'$  tablet (total '150mg'$  daily) 10/22/20   [provider]  fluticasone (FLONASE) 50 MCG/ACT nasal spray 2 spray in each nostril    [provider]  hydrochlorothiazide (HYDRODIURIL) 25 MG tablet Take 25 mg by mouth daily.    [provider]  hydrOXYzine (ATARAX) 10 MG tablet     [provider]  hydrOXYzine (ATARAX) 25 MG tablet Take 1 tablet (25 mg total) by mouth every 6 (six) hours as needed for anxiety. 07/17/21   Gareth Morgan, MD  lamoTRIgine (LAMICTAL) 150 MG tablet Take 150 mg by mouth daily. 06/05/21   [provider]  levothyroxine (SYNTHROID, LEVOTHROID) 137 MCG tablet Take 137 mcg by mouth daily before breakfast.    [provider]  losartan (COZAAR) 50 MG tablet Take 50 mg by mouth daily.    [provider]  Magnesium 100 MG CAPS Take 100 mg by mouth daily.    [provider]  metFORMIN (GLUCOPHAGE-XR) 500 MG 24 hr tablet Take 500 mg by mouth daily with breakfast.    [provider]  metoprolol tartrate (LOPRESSOR) 25 MG tablet TAKE 1/2 TO 1 TABLET IN THE MORNING AND AT BEDTIME FOR PALPITATIONS. NEED APPOINTMENT FOR FURTHER REFILLS 03/03/21  Skeet Latch, MD  ondansetron (ZOFRAN-ODT) 4 MG disintegrating tablet Take 1 tablet (4 mg total) by mouth every 8 (eight) hours as needed for nausea or vomiting. 07/17/21   Gareth Morgan, MD  oxyCODONE (OXY IR/ROXICODONE) 5 MG immediate release tablet Take 1 tablet (5 mg total) by mouth every 6 (six) hours as needed for severe pain. 08/15/19   Georganna Skeans, MD  pantoprazole (PROTONIX) 40 MG tablet Take 1 tablet (40 mg total) by mouth 2 (two) times daily. Patient taking differently: Take 40 mg by mouth daily. 10/08/16   Nita Sells, MD  potassium chloride SA (KLOR-CON M) 20 MEQ tablet Take 1 tablet  (20 mEq total) by mouth 2 (two) times daily. 09/27/21   Fredia Sorrow, MD  rosuvastatin (CRESTOR) 40 MG tablet Take 1 tablet (40 mg total) by mouth daily. 08/18/17   Skeet Latch, MD  Vitamin D, Ergocalciferol, (DRISDOL) 1.25 MG (50000 UNIT) CAPS capsule Take 50,000 Units by mouth once a week. 07/01/21   [provider]      Allergies    Ceftin [cefuroxime axetil], Codeine, and Compazine [prochlorperazine]    Review of Systems   Review of Systems  All other systems reviewed and are negative.   Physical Exam Updated Vital Signs BP 133/60   Pulse 80   Temp 98 F (36.7 C) (Oral)   Resp 18   Ht '5\' 3"'$  (1.6 m)   Wt 95 kg   SpO2 95%   BMI 37.10 kg/m  Physical Exam Vitals and nursing note reviewed.  Constitutional:      General: She is not in acute distress.    Appearance: She is well-developed. She is not diaphoretic.  HENT:     Head: Normocephalic and atraumatic.  Cardiovascular:     Rate and Rhythm: Normal rate and regular rhythm.     Heart sounds: No murmur heard.    No friction rub. No gallop.  Pulmonary:     Effort: Pulmonary effort is normal. No respiratory distress.     Breath sounds: Normal breath sounds. No wheezing.  Abdominal:     General: Bowel sounds are normal. There is no distension.     Palpations: Abdomen is soft.     Tenderness: There is no abdominal tenderness.  Musculoskeletal:        General: Normal range of motion.     Cervical back: Normal range of motion and neck supple.  Skin:    General: Skin is warm and dry.  Neurological:     General: No focal deficit present.     Mental Status: She is alert and oriented to person, place, and time.     ED Results / Procedures / Treatments   Labs (all labs ordered are listed, but only abnormal results are displayed) Labs Reviewed  BASIC METABOLIC PANEL - Abnormal; Notable for the following components:      Result Value   Potassium 3.4 (*)    CO2 21 (*)    Glucose, Bld 103 (*)    BUN 6 (*)     All other components within normal limits  CBC  TROPONIN I (HIGH SENSITIVITY)  TROPONIN I (HIGH SENSITIVITY)    EKG ED ECG REPORT   Date: 09/30/2021  Rate: 77  Rhythm: normal sinus rhythm  QRS Axis: normal  Intervals: normal  ST/T Wave abnormalities: normal  Conduction Disutrbances:none  Narrative Interpretation:   Old EKG Reviewed: unchanged  I have personally reviewed the EKG tracing and agree with the computerized printout as noted.  Radiology No results found.  Procedures Procedures    Medications Ordered in ED Medications - No data to display  ED Course/ Medical Decision Making/ A&P  Patient presenting here with multiple complaints as outlined in the HPI.  Work-up unremarkable including laboratory studies, EKG, and KUB.  CT of the abdomen and pelvis performed 2 days prior was unremarkable and I see no indication to repeat this.  Patient seems very anxious, tearful, and stressed out.  She expresses feeling depressed, anxious, and reports thoughts of suicide.  Patient requesting to speak with TTS.  Consult has been placed and are recommending reassessment in the morning.  I highly doubt patient has medical issues and that her symptoms are most likely related to anxiety/depression.  Care will be signed out to oncoming provider at shift change.  Final Clinical Impression(s) / ED Diagnoses Final diagnoses:  None    Rx / DC Orders ED Discharge Orders     None         Veryl Speak, MD 09/30/21 2301

## 2021-09-30 NOTE — ED Notes (Signed)
Pt belongings returned to patient and assisted with dressing. Reviewed discharge instructions,recommendations and new medications with pt and husband. Aware of new prescription to pick up at pharmacy Questions answered. Pt is ambulatory upon discharge

## 2021-09-30 NOTE — ED Notes (Signed)
Pt now being transported via w/c by ED to pysch room -- pt awake and alert; no obvious acute distress noted.

## 2021-09-30 NOTE — ED Notes (Signed)
Pt reports feeling anxious and that she normally takes medication. EDP notified

## 2021-09-30 NOTE — ED Notes (Addendum)
Pt lying in bed now resting with eyes closed; RR even and unlabored on RA with symmetrical rise and fall of chest- no acute distress.  Will maintain plan of care and continue to monitor for acute changes.  Mag Citrate had been ordered to address constipation however med not available at this facility -Dr. Stark Jock made aware

## 2021-09-30 NOTE — ED Notes (Addendum)
Pt placed in Rm 12, environment secured.  Pt placed in maroon scrubs and belongings secured in locked cabinet.  Security called to wand pt and belongings. Pt husband took home pt cell phone and cane. Visitation policy reviewed with pt husband.

## 2021-09-30 NOTE — ED Notes (Signed)
Per psych NP pt will be discharged home. Waiting on note
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
## 2021-09-30 NOTE — ED Notes (Signed)
Per provider pt verbalized suicidal ideations, SI precautions initiated

## 2021-09-30 NOTE — ED Notes (Signed)
Pt connected to TTS at this time - speaking with counselor.

## 2021-09-30 NOTE — ED Provider Notes (Incomplete)
[facility] Provider Note   CSN: [id number] Arrival date & time: 09/29/21  2107     History  Chief Complaint  Patient presents with  . Chest Pain  . Panic Attack    Danielle [name] is a 76 y.o. female.  Patient is a 76 year old female presenting with multiple complaints.  Patient states that she feels anxious, nauseated, and is having discomfort in her chest.  She has been having these episodes for many months.  She has been seen by her primary doctor and a psychiatrist.  She has been tried on several medications, however these feelings persist.  She tells me she has lost weight and has no appetite.  She was seen here 2 days ago with a similar complaint.  Work-up was unremarkable, but returns stating that she is not feeling any better.  She has been prescribed Xanax which has helped somewhat.  The history is provided by the patient.       Home Medications Prior to Admission medications   Medication Sig Start Date End Date Taking? Authorizing Provider  ALPRAZolam (XANAX) 0.25 MG tablet Take 0.25 mg by mouth daily as needed.    [provider]  ALPRAZolam Duanne Moron) 0.25 MG tablet Take 1-2 tabs (0.'25mg'$ -0.'50mg'$ ) 30-60 minutes before procedure. May repeat if needed.Do not drive. 12/18/20   Melvenia Beam, MD  aspirin EC 81 MG tablet Take 1 tablet (81 mg total) by mouth daily. 10/20/16   Strader, Fransisco Hertz, PA-C  Biotin 5000 MCG CAPS Take 1 capsule (5,000 mcg total) by mouth 1 day or 1 dose. 10/08/16   Nita Sells, MD  CALCIUM-VITAMIN D PO Take 1 capsule by mouth daily. 1200-1000 calcium-vitamin d    [provider]  Cholecalciferol (VITAMIN D3) 25 MCG (1000 UT) CAPS 1 capsule 05/20/21   [provider]  ciprofloxacin (CIPRO) 500 MG tablet Take 1 tablet (500 mg total) by mouth every 12 (twelve) hours. 09/24/21   Molpus, John, MD  clonazePAM (KLONOPIN) 1 MG tablet     [provider]  desvenlafaxine (PRISTIQ) 100 MG  24 hr tablet Take 100 mg by mouth daily. 08/16/20   [provider]  desvenlafaxine (PRISTIQ) 50 MG 24 hr tablet Take 50 mg by mouth daily. Takes with '100mg'$  tablet (total '150mg'$  daily) 10/22/20   [provider]  fluticasone (FLONASE) 50 MCG/ACT nasal spray 2 spray in each nostril    [provider]  hydrochlorothiazide (HYDRODIURIL) 25 MG tablet Take 25 mg by mouth daily.    [provider]  hydrOXYzine (ATARAX) 10 MG tablet     [provider]  hydrOXYzine (ATARAX) 25 MG tablet Take 1 tablet (25 mg total) by mouth every 6 (six) hours as needed for anxiety. 07/17/21   Gareth Morgan, MD  lamoTRIgine (LAMICTAL) 150 MG tablet Take 150 mg by mouth daily. 06/05/21   [provider]  levothyroxine (SYNTHROID, LEVOTHROID) 137 MCG tablet Take 137 mcg by mouth daily before breakfast.    [provider]  losartan (COZAAR) 50 MG tablet Take 50 mg by mouth daily.    [provider]  Magnesium 100 MG CAPS Take 100 mg by mouth daily.    [provider]  metFORMIN (GLUCOPHAGE-XR) 500 MG 24 hr tablet Take 500 mg by mouth daily with breakfast.    [provider]  metoprolol tartrate (LOPRESSOR) 25 MG tablet TAKE 1/2 TO 1 TABLET IN THE MORNING AND AT BEDTIME FOR PALPITATIONS. NEED APPOINTMENT FOR FURTHER REFILLS 03/03/21  Skeet Latch, MD  ondansetron (ZOFRAN-ODT) 4 MG disintegrating tablet Take 1 tablet (4 mg total) by mouth every 8 (eight) hours as needed for nausea or vomiting. 07/17/21   Gareth Morgan, MD  oxyCODONE (OXY IR/ROXICODONE) 5 MG immediate release tablet Take 1 tablet (5 mg total) by mouth every 6 (six) hours as needed for severe pain. 08/15/19   Georganna Skeans, MD  pantoprazole (PROTONIX) 40 MG tablet Take 1 tablet (40 mg total) by mouth 2 (two) times daily. Patient taking differently: Take 40 mg by mouth daily. 10/08/16   Nita Sells, MD  potassium chloride SA (KLOR-CON M) 20 MEQ tablet Take 1 tablet  (20 mEq total) by mouth 2 (two) times daily. 09/27/21   Fredia Sorrow, MD  rosuvastatin (CRESTOR) 40 MG tablet Take 1 tablet (40 mg total) by mouth daily. 08/18/17   Skeet Latch, MD  Vitamin D, Ergocalciferol, (DRISDOL) 1.25 MG (50000 UNIT) CAPS capsule Take 50,000 Units by mouth once a week. 07/01/21   [provider]      Allergies    Ceftin [cefuroxime axetil], Codeine, and Compazine [prochlorperazine]    Review of Systems   Review of Systems  All other systems reviewed and are negative.   Physical Exam Updated Vital Signs BP 133/60   Pulse 80   Temp 98 F (36.7 C) (Oral)   Resp 18   Ht '5\' 3"'$  (1.6 m)   Wt 95 kg   SpO2 95%   BMI 37.10 kg/m  Physical Exam Vitals and nursing note reviewed.  Constitutional:      General: She is not in acute distress.    Appearance: She is well-developed. She is not diaphoretic.  HENT:     Head: Normocephalic and atraumatic.  Cardiovascular:     Rate and Rhythm: Normal rate and regular rhythm.     Heart sounds: No murmur heard.    No friction rub. No gallop.  Pulmonary:     Effort: Pulmonary effort is normal. No respiratory distress.     Breath sounds: Normal breath sounds. No wheezing.  Abdominal:     General: Bowel sounds are normal. There is no distension.     Palpations: Abdomen is soft.     Tenderness: There is no abdominal tenderness.  Musculoskeletal:        General: Normal range of motion.     Cervical back: Normal range of motion and neck supple.  Skin:    General: Skin is warm and dry.  Neurological:     General: No focal deficit present.     Mental Status: She is alert and oriented to person, place, and time.     ED Results / Procedures / Treatments   Labs (all labs ordered are listed, but only abnormal results are displayed) Labs Reviewed  BASIC METABOLIC PANEL - Abnormal; Notable for the following components:      Result Value   Potassium 3.4 (*)    CO2 21 (*)    Glucose, Bld 103 (*)    BUN 6 (*)     All other components within normal limits  CBC  TROPONIN I (HIGH SENSITIVITY)  TROPONIN I (HIGH SENSITIVITY)    EKG None  Radiology No results found.  Procedures Procedures  {Document cardiac monitor, telemetry assessment procedure when appropriate:1}  Medications Ordered in ED Medications - No data to display  ED Course/ Medical Decision Making/ A&P  Medical Decision Making Amount and/or Complexity of Data Reviewed Labs: ordered.   ***  {Document critical care time when appropriate:1} {Document review of labs and clinical decision tools ie heart score, Chads2Vasc2 etc:1}  {Document your independent review of radiology images, and any outside records:1} {Document your discussion with family members, caretakers, and with consultants:1} {Document social determinants of health affecting pt's care:1} {Document your decision making why or why not admission, treatments were needed:1} Final Clinical Impression(s) / ED Diagnoses Final diagnoses:  None    Rx / DC Orders ED Discharge Orders     None

## 2021-09-30 NOTE — ED Notes (Signed)
Patient informed MD Delo that she is suicidal. Camera operator made aware . All ligatures removed from room , property removed . Patient under close observation.

## 2021-09-30 NOTE — Consult Note (Signed)
Telepsych Consultation   Reason for Consult: SI, anxiety, pression Referring Physician: Dr. Florina Ou Location of Patient: [facility] Location of Provider: [facility]  Patient Identification: Danielle [name] MRN:  [id number] Principal Diagnosis: <principal problem not specified> Diagnosis:  Active Problems:   * No active hospital problems. *   Total Time spent with patient: 1 hour  Subjective:   Danielle [name] is a 76 y.o. female patient.  HPI: As per the [facility] initial intake notes: Patient is tearful at times during the assessment.  She has a poor memory.  Patient is oriented x3 and has fair eye contact.  She reports seeing and hearing a pet that died three months ago.  Patient does not appear to have delusional thought issues.  She has a poor appetite and has lost 12 lbs in last 3 months.  Patient sleeps about 6 hours a day.     Pt has therapy and medication monitoring from [facility].  Pt reports no previus inpatient care.  Assessment: Patient was seen and examined in her room at [name].  Patient appears calm and visiting with a friend.  Friend was excuse out during the assessment.  Appears calm and participating in the exam.  Chart reviewed and findings shared with the treatment team and discussed with Dr. Caswell Corwin via this note.  Alert and oriented x4, to person, time, place, and situation.  Speech clear and fluent and able to maintain good eye contact during the encounter.  Patient presents with anxious mood, affect appropriate and congruent.  When asked what brought patient to the hospital, patient reports that her husband had a function that they were supposed to attend, however, she got sick and could not go.  She was surprised and angry that her husband attended the function without her.  Reported to this provider that they have been married for 5 years.  Added, "I cannot believe my husband left me alone at home  to enjoy with his friends and family.  That causes too much anxiety for her."  Patient reports, "my major problem right now is my constipation.  If I can go I will feel much better."  Med ordered for constipation in a.m.  Was made aware by nursing staff patient had 3 BMs today. Patient also reports that she has been in the hospital 2-3 times within the past week for burning sensation all over and anxiety.  Denies suicidal ideation, however added, "I have suicidal thoughts if no one is listening to me or paying attention to me."  This seems like attention seeking.  Patient denies HI, VH, paranoia or delusional.  Reports seeing her pet that died 3 months ago at the periphery of her eye.  Denies any command hallucinations. Reports sleeping for 3 hours last night, reports appetite is fair and losing 12 pounds of weight within the past 3 months.  Reported being safe at home, and reports access to firearms however locked up. Denies self-injurious behavior, denies drug use, denies alcohol use, or tobacco use. Endorses being followed by a therapist and a psychiatrist at the [facility], who manages her medications.  Reports that the psychiatrist was on vacation however is back at this present time. Reports she was started on Prozac by her psychiatrist.  Made patient aware to return to her psychiatrist who would manage her medication appropriately after discharge.  Disposition: Patient is fairly newly remarried and reports anxiety resulting from husband not being  around when she was sick.  She also has history of memory loss and being worked up for dementia by her PCP as per patient.  Patient is not at imminent danger to herself or others at this time and does not meet the criteria for inpatient psychiatric admission.  Patient is psych clear and can be discharged home when medically stable.  [facility] for made aware of patient disposition.   Past Psychiatric History: History of memory loss,  depression and anxiety  Risk to Self:  No Risk to Others:  No Prior Inpatient Therapy:  No Prior Outpatient Therapy:  Yes  Past Medical History:  Past Medical History:  Diagnosis Date   Arthritis 02/13/2011   osteoarthritis, hips, knees,s/p Cervical fusion(DDD)   Atherosclerosis of aorta ([place]) 05/30/2015   on CTA chest   CAD (coronary artery disease)    a. 09/2016: Coronary CT showing mid-LAD plaque with 20-50% associated stenosis but no significant stenosis by FFR analysis.    Dementia ([place])    Depression with anxiety    Diabetes mellitus without complication ([place])    Difficult intubation 02/13/2011   with surgery -multiple times, no problems with recent surgeries   GERD (gastroesophageal reflux disease) 02/13/2011   tx. Omeprazole   Glaucoma 02/13/2011   bil. tx. eye drops daily   Headache(784.0) 02/13/2011   past hx. migraines, none recent   Heart murmur 02/13/2011   was told-no issues with this   Hypercholesterolemia    Hypertension 02/13/2011   Hypothyroidism 02/13/2011   tx. with Levothyroxine   Mitral regurgitation 06/01/2015   Mild   Sinus drainage 02/13/2011   uses Zyrtec as needed   Sleep apnea 02/13/2011   Hx. sleep apnea-uses cpap since 10'12 nightly    Past Surgical History:  Procedure Laterality Date   ABDOMINAL HYSTERECTOMY  02/13/2011   APPENDECTOMY  02/13/2011   Lap. removal '92   BREAST CYST EXCISION Left    CERVICAL FUSION  02/13/2011   '92- retained hardware   CHOLECYSTECTOMY  02/13/2011   '98-lap. galbladder removal due to stones   GUM SURGERY  02/13/2011   "small mouth"   JOINT REPLACEMENT  02/13/2011   RTHA-a yr ago in [place] Left 08/15/2019   Procedure: EXCISION OF SEBACEOUS CYST LEFT BREAST;  Surgeon: Georganna Skeans, MD;  Location: [facility];  Service: General;  Laterality: Left;   TONSILLECTOMY  02/13/2011   child   TOTAL HIP ARTHROPLASTY  02/17/2011   Procedure: TOTAL HIP ARTHROPLASTY ANTERIOR  APPROACH;  Surgeon: Mauri Pole, MD;  Location: [facility];  Service: Orthopedics;  Laterality: Left;   TUBAL LIGATION  02/13/2011   Family History:  Family History  Problem Relation Age of Onset   Cancer Mother        lung   Breast cancer Maternal Aunt    Family Psychiatric  History: Son has PTSD from the war Social History:  Social History   Substance and Sexual Activity  Alcohol Use Yes   Comment: rare     Social History   Substance and Sexual Activity  Drug Use No    Social History   Socioeconomic History   Marital status: Married    Spouse name: Not on file   Number of children: Not on file   Years of education: Not on file   Highest education level: Not on file  Occupational History   Not on file  Tobacco Use   Smoking status: Never  Smokeless tobacco: Never  Vaping Use   Vaping Use: Never used  Substance and Sexual Activity   Alcohol use: Yes    Comment: rare   Drug use: No   Sexual activity: Yes    Birth control/protection: Surgical    Comment: hyst  Other Topics Concern   Not on file  Social History Narrative   Caffeine le-ss then once cup daily. Education: HS diploma.  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     Social Determinants of Health   Financial Resource Strain: Not on file  Food Insecurity: Not on file  Transportation Needs: Not on file  Physical Activity: Not on file  Stress: Not on file  Social Connections: Not on file   Additional Social History:    Allergies:   Allergies  Allergen Reactions   Ceftin [Cefuroxime Axetil] Hives and Swelling    Swelling of face   Codeine Nausea Only   Compazine [Prochlorperazine] Swelling and Other (See Comments)    Face and tongue swelling    Labs:  Results for orders placed or performed during the hospital encounter of 09/29/21 (from the past 48 hour(s))  Basic metabolic panel     Status: Abnormal   Collection Time: 09/29/21  9:22 PM  Result Value Ref Range   Sodium  140 135 - 145 mmol/L   Potassium 3.4 (L) 3.5 - 5.1 mmol/L   Chloride 110 98 - 111 mmol/L   CO2 21 (L) 22 - 32 mmol/L   Glucose, Bld 103 (H) 70 - 99 mg/dL    Comment: Glucose reference range applies only to samples taken after fasting for at least 8 hours.   BUN 6 (L) 8 - 23 mg/dL   Creatinine, Ser 0.91 0.44 - 1.00 mg/dL   Calcium 9.3 8.9 - 10.3 mg/dL   GFR, Estimated >60 >60 mL/min    Comment: (NOTE) Calculated using the CKD-EPI Creatinine Equation (2021)    Anion gap 9 5 - 15    Comment: Performed at [facility], [address] [place], [place] [address]  CBC     Status: None   Collection Time: 09/29/21  9:22 PM  Result Value Ref Range   WBC 6.4 4.0 - 10.5 K/uL   RBC 4.82 3.87 - 5.11 MIL/uL   Hemoglobin 14.3 12.0 - 15.0 g/dL   HCT 41.9 36.0 - 46.0 %   MCV 86.9 80.0 - 100.0 fL   MCH 29.7 26.0 - 34.0 pg   MCHC 34.1 30.0 - 36.0 g/dL   RDW 13.2 11.5 - 15.5 %   Platelets 238 150 - 400 K/uL   nRBC 0.0 0.0 - 0.2 %    Comment: Performed at [facility], [address] [place], [place] [address]  Troponin I (High Sensitivity)     Status: None   Collection Time: 09/29/21  9:22 PM  Result Value Ref Range   Troponin I (High Sensitivity) 5 <18 ng/L    Comment: (NOTE) Elevated high sensitivity troponin I (hsTnI) values and significant  changes across serial measurements may suggest ACS but many other  chronic and acute conditions are known to elevate hsTnI results.  Refer to the "Links" section for chest pain algorithms and additional  guidance. Performed at [facility], [address] [place], [place] [address]   Troponin I (High Sensitivity)     Status: None   Collection Time: 09/29/21 11:39 PM  Result Value Ref Range  Troponin I (High Sensitivity) 4 <18 ng/L    Comment: (NOTE) Elevated high sensitivity troponin I (hsTnI) values and significant  changes across serial measurements may suggest ACS but many other  chronic and acute conditions  are known to elevate hsTnI results.  Refer to the "Links" section for chest pain algorithms and additional  guidance. Performed at [facility], [address] [place], [place] [address]   Resp Panel by RT-PCR (Flu A&B, Covid) Anterior Nasal Swab     Status: None   Collection Time: 09/30/21  1:30 AM   Specimen: Anterior Nasal Swab  Result Value Ref Range   SARS Coronavirus 2 by RT PCR NEGATIVE NEGATIVE    Comment: (NOTE) SARS-CoV-2 target nucleic acids are NOT DETECTED.  The SARS-CoV-2 RNA is generally detectable in upper respiratory specimens during the acute phase of infection. The lowest concentration of SARS-CoV-2 viral copies this assay can detect is 138 copies/mL. A negative result does not preclude SARS-Cov-2 infection and should not be used as the sole basis for treatment or other patient management decisions. A negative result may occur with  improper specimen collection/handling, submission of specimen other than nasopharyngeal swab, presence of viral mutation(s) within the areas targeted by this assay, and inadequate number of viral copies(<138 copies/mL). A negative result must be combined with clinical observations, patient history, and epidemiological information. The expected result is Negative.  Fact Sheet for Patients:  EntrepreneurPulse.com.au  Fact Sheet for Healthcare Providers:  IncredibleEmployment.be  This test is no t yet approved or cleared by the [place] FDA and  has been authorized for detection and/or diagnosis of SARS-CoV-2 by FDA under an Emergency Use Authorization (EUA). This EUA will remain  in effect (meaning this test can be used) for the duration of the COVID-19 declaration under Section 564(b)(1) of the Act, 21 U.S.C.section 360bbb-3(b)(1), unless the authorization is terminated  or revoked sooner.       Influenza A by PCR NEGATIVE NEGATIVE   Influenza B by PCR NEGATIVE NEGATIVE     Comment: (NOTE) The Xpert Xpress SARS-CoV-2/FLU/RSV plus assay is intended as an aid in the diagnosis of influenza from Nasopharyngeal swab specimens and should not be used as a sole basis for treatment. Nasal washings and aspirates are unacceptable for Xpert Xpress SARS-CoV-2/FLU/RSV testing.  Fact Sheet for Patients: EntrepreneurPulse.com.au  Fact Sheet for Healthcare Providers: IncredibleEmployment.be  This test is not yet approved or cleared by the [place] FDA and has been authorized for detection and/or diagnosis of SARS-CoV-2 by FDA under an Emergency Use Authorization (EUA). This EUA will remain in effect (meaning this test can be used) for the duration of the COVID-19 declaration under Section 564(b)(1) of the Act, 21 U.S.C. section 360bbb-3(b)(1), unless the authorization is terminated or revoked.  Performed at [facility], [address] [place], [place] [address]   Urinalysis, Routine w reflex microscopic Urine, Clean Catch     Status: Abnormal   Collection Time: 09/30/21 11:41 AM  Result Value Ref Range   Color, Urine YELLOW YELLOW   APPearance CLEAR CLEAR   Specific Gravity, Urine >=1.030 1.005 - 1.030   pH 5.0 5.0 - 8.0   Glucose, UA NEGATIVE NEGATIVE mg/dL   Hgb urine dipstick TRACE (A) NEGATIVE   Bilirubin Urine SMALL (A) NEGATIVE   Ketones, ur NEGATIVE NEGATIVE mg/dL   Protein, ur NEGATIVE NEGATIVE mg/dL   Nitrite NEGATIVE NEGATIVE   Leukocytes,Ua NEGATIVE NEGATIVE    Comment: Performed at [facility], [address] [place], [place] [address]  Urinalysis, Microscopic (reflex)     Status: Abnormal   Collection Time: 09/30/21 11:41 AM  Result Value Ref Range   RBC / HPF 0-5 0 - 5 RBC/hpf   WBC, UA 6-10 0 - 5 WBC/hpf   Bacteria, UA RARE (A) NONE SEEN   Squamous Epithelial / LPF 0-5 0 - 5   Non Squamous Epithelial PRESENT (A) NONE SEEN   Mucus PRESENT     Comment: Performed at [facility], [address] [place], [place] [address]  CBG monitoring, ED     Status: Abnormal   Collection Time: 09/30/21  1:11 PM  Result Value Ref Range   Glucose-Capillary 114 (H) 70 - 99 mg/dL    Comment: Glucose reference range applies only to samples taken after fasting for at least 8 hours.    Medications:  Current Facility-Administered Medications  Medication Dose Route Frequency Provider Last Rate Last Admin   docusate sodium (COLACE) capsule 100 mg  100 mg Oral BID Charlesia Canaday, Kris Hartmann, FNP       lactulose (CHRONULAC) 10 GM/15ML solution 20 g  20 g Oral Once Jessica Checketts, Kris Hartmann, FNP       Current Outpatient Medications  Medication Sig Dispense Refill   ALPRAZolam (XANAX) 0.25 MG tablet Take 0.25 mg by mouth daily as needed.     ALPRAZolam (XANAX) 0.25 MG tablet Take 1-2 tabs (0.'25mg'$ -0.'50mg'$ ) 30-60 minutes before procedure. May repeat if needed.Do not drive. 4 tablet 0   aspirin EC 81 MG tablet Take 1 tablet (81 mg total) by mouth daily.     Biotin 5000 MCG CAPS Take 1 capsule (5,000 mcg total) by mouth 1 day or 1 dose. 30 capsule 0   CALCIUM-VITAMIN D PO Take 1 capsule by mouth daily. 1200-1000 calcium-vitamin d     Cholecalciferol (VITAMIN D3) 25 MCG (1000 UT) CAPS 1 capsule     ciprofloxacin (CIPRO) 500 MG tablet Take 1 tablet (500 mg total) by mouth every 12 (twelve) hours. 10 tablet 0   clonazePAM (KLONOPIN) 1 MG tablet      desvenlafaxine (PRISTIQ) 100 MG 24 hr tablet Take 100 mg by mouth daily.     desvenlafaxine (PRISTIQ) 50 MG 24 hr tablet Take 50 mg by mouth daily. Takes with '100mg'$  tablet (total '150mg'$  daily)     FLUoxetine (PROZAC) 20 MG capsule Take 20 mg by mouth every morning.     fluticasone (FLONASE) 50 MCG/ACT nasal spray 2 spray in each nostril     hydrochlorothiazide (HYDRODIURIL) 25 MG tablet Take 25 mg by mouth daily.     hydrOXYzine (ATARAX) 25 MG tablet Take 1 tablet (25 mg total) by mouth every 6 (six) hours as needed for anxiety. 12 tablet 0   lamoTRIgine  (LAMICTAL) 150 MG tablet Take 150 mg by mouth daily.     lamoTRIgine (LAMICTAL) 200 MG tablet Take 200 mg by mouth daily.     levothyroxine (SYNTHROID, LEVOTHROID) 137 MCG tablet Take 137 mcg by mouth daily before breakfast.     losartan (COZAAR) 50 MG tablet Take 50 mg by mouth daily.     Magnesium 100 MG CAPS Take 100 mg by mouth daily.     metFORMIN (GLUCOPHAGE-XR) 500 MG 24 hr tablet Take 500 mg by mouth daily with breakfast.     metoprolol tartrate (LOPRESSOR) 25 MG tablet TAKE 1/2 TO 1 TABLET IN THE MORNING AND AT BEDTIME FOR PALPITATIONS. NEED APPOINTMENT  FOR FURTHER REFILLS 15 tablet 0   ondansetron (ZOFRAN) 4 MG tablet Take 4 mg by mouth every 8 (eight) hours as needed.     oxyCODONE (OXY IR/ROXICODONE) 5 MG immediate release tablet Take 1 tablet (5 mg total) by mouth every 6 (six) hours as needed for severe pain. 10 tablet 0   pantoprazole (PROTONIX) 40 MG tablet Take 1 tablet (40 mg total) by mouth 2 (two) times daily. (Patient taking differently: Take 40 mg by mouth daily.) 60 tablet 0   potassium chloride SA (KLOR-CON M) 20 MEQ tablet Take 1 tablet (20 mEq total) by mouth 2 (two) times daily. 4 tablet 0   rosuvastatin (CRESTOR) 40 MG tablet Take 1 tablet (40 mg total) by mouth daily. 90 tablet 3   Vitamin D, Ergocalciferol, (DRISDOL) 1.25 MG (50000 UNIT) CAPS capsule Take 50,000 Units by mouth once a week.      Musculoskeletal: Strength & Muscle Tone: within normal limits Gait & Station: normal Patient leans: N/A  Psychiatric Specialty Exam:  Presentation  General Appearance: Appropriate for Environment; Casual; Fairly Groomed  Eye Contact:Good  Speech:Clear and Coherent; Normal Rate  Speech Volume:Normal  Handedness:Right  Mood and Affect  Mood:Anxious  Affect:Appropriate; Congruent  Thought Process  Thought Processes:Coherent  Descriptions of Associations:Intact  Orientation:Full (Time, Place and Person)  Thought Content:Logical; Rumination  History of  Schizophrenia/Schizoaffective disorder:No  Duration of Psychotic Symptoms:N/A  Hallucinations:Hallucinations: Visual Description of Visual Hallucinations: Reports she sees her pet that died 3 months ago, at the periphery of her eyes.  Ideas of Reference:None  Suicidal Thoughts:Suicidal Thoughts: No  Homicidal Thoughts:Homicidal Thoughts: No  Sensorium  Memory:Immediate Fair; Recent Fair; Remote [place]  Insight:Fair  Executive Functions  Concentration:Good  Attention Span:Good  [place]  Language:Good  Psychomotor Activity  Psychomotor Activity:Psychomotor Activity: Normal  Assets  Assets:Communication Skills; Desire for Improvement; Physical Health; Resilience  Sleep  Sleep:Sleep: Fair Number of Hours of Sleep: 3  Physical Exam: Physical Exam Vitals and nursing note reviewed.  HENT:     Head: Normocephalic.  Eyes:     Pupils: Pupils are equal, round, and reactive to light.  Cardiovascular:     Rate and Rhythm: Normal rate.  Pulmonary:     Effort: Pulmonary effort is normal.  Abdominal:     Palpations: Abdomen is soft.  Musculoskeletal:        General: Normal range of motion.     Cervical back: Normal range of motion.  Skin:    General: Skin is warm.  Neurological:     General: No focal deficit present.     Mental Status: She is alert and oriented to person, place, and time.    Review of Systems  Constitutional: Negative.  Negative for chills and fever.  HENT: Negative.  Negative for hearing loss and tinnitus.   Eyes: Negative.  Negative for blurred vision and double vision.  Respiratory: Negative.  Negative for cough and hemoptysis.   Cardiovascular: Negative.  Negative for chest pain and palpitations.  Gastrointestinal:  Positive for constipation. Negative for heartburn and nausea.  Genitourinary:  Negative for dysuria and urgency.  Musculoskeletal: Negative.  Negative for myalgias and neck pain.  Skin:  Negative.  Negative for itching and rash.  Neurological: Negative.  Negative for dizziness, tingling and headaches.  Endo/Heme/Allergies: Negative.  Negative for polydipsia. Does not bruise/bleed easily.       Ceftin [Cefuroxime Axetil] Ceftin [Cefuroxime Axetil]  Hives, Swelling Medium Allergy 02/13/2011 Swelling of face Deletion  Reason:  Codeine Codeine  Nausea Only Not Specified  12/05/2010 Deletion Reason:  Compazine [Prochlorperazine] Compazine [Prochlorperazine]  Swelling, Other (See Comments) Not Specified  12/05/2010 Face and tongue swelling    Psychiatric/Behavioral:  Positive for depression. The patient is nervous/anxious.    Blood pressure (!) 145/77, pulse 74, temperature 99.1 F (37.3 C), temperature source Oral, resp. rate 17, height '5\' 3"'$  (1.6 m), weight 95 kg, SpO2 100 %. Body mass index is 37.1 kg/m.  Treatment Plan Summary: Daily contact with patient to assess and evaluate symptoms and progress in treatment and Medication management  Disposition: Patient does not meet criteria for psychiatric inpatient admission.  This service was provided via telemedicine using a 2-way, interactive audio and video technology.  Names of all persons participating in this telemedicine service and their role in this encounter. Name: Bluford Kaufmann Role: Patient  Name: Garrison Columbus, NP Role: Provider  Name: Dr. Florina Ou Role: [place] physician  Name: Dr. Mamie Levers Role: [place], [place] 09/30/2021 2:25 PM

## 2021-09-30 NOTE — ED Notes (Signed)
Suicide documentation no longer needed at this time

## 2021-10-01 DIAGNOSIS — G4733 Obstructive sleep apnea (adult) (pediatric): Secondary | ICD-10-CM | POA: Diagnosis not present

## 2021-10-01 DIAGNOSIS — G4752 REM sleep behavior disorder: Secondary | ICD-10-CM | POA: Diagnosis not present

## 2021-10-02 ENCOUNTER — Telehealth: Payer: Self-pay | Admitting: Neurology

## 2021-10-02 NOTE — Telephone Encounter (Signed)
I called pt then spoke to husband.  She is very anxious.  She has been to [place] 3 times in last week.  Not getting help from psych office ? Other offices?  I told him we saw her for memory issues and she does have an appt in 10/05-2021 at 0730 for f/u.  For her anxiety/ depression she needs to see psych.  I did mention the urgent care behavioral health at [address] is another option.  He stated she had been to  [facility] times.

## 2021-10-02 NOTE — Telephone Encounter (Signed)
Pt is calling. Stated she needs a sooner appointment bc she's having panic attacks. Pt stated she was recently at ED and needs to be seen. Pt is requesting a call back from nurse.

## 2021-10-03 DIAGNOSIS — G4733 Obstructive sleep apnea (adult) (pediatric): Secondary | ICD-10-CM | POA: Diagnosis not present

## 2021-10-03 DIAGNOSIS — G4752 REM sleep behavior disorder: Secondary | ICD-10-CM | POA: Diagnosis not present

## 2021-10-03 DIAGNOSIS — F3341 Major depressive disorder, recurrent, in partial remission: Secondary | ICD-10-CM | POA: Diagnosis not present

## 2021-10-06 DIAGNOSIS — G4733 Obstructive sleep apnea (adult) (pediatric): Secondary | ICD-10-CM | POA: Diagnosis not present

## 2021-10-09 ENCOUNTER — Ambulatory Visit (INDEPENDENT_AMBULATORY_CARE_PROVIDER_SITE_OTHER): Payer: Medicare HMO | Admitting: Behavioral Health

## 2021-10-09 ENCOUNTER — Encounter: Payer: Self-pay | Admitting: Behavioral Health

## 2021-10-09 VITALS — BP 136/67 | HR 90 | Ht 63.0 in | Wt 199.0 lb

## 2021-10-09 DIAGNOSIS — F410 Panic disorder [episodic paroxysmal anxiety] without agoraphobia: Secondary | ICD-10-CM | POA: Diagnosis not present

## 2021-10-09 DIAGNOSIS — R443 Hallucinations, unspecified: Secondary | ICD-10-CM

## 2021-10-09 DIAGNOSIS — F411 Generalized anxiety disorder: Secondary | ICD-10-CM

## 2021-10-09 DIAGNOSIS — F333 Major depressive disorder, recurrent, severe with psychotic symptoms: Secondary | ICD-10-CM | POA: Diagnosis not present

## 2021-10-09 MED ORDER — SERTRALINE HCL 50 MG PO TABS: 50.0000 mg | ORAL_TABLET | Freq: Every day | ORAL | 1 refills | Status: DC

## 2021-10-09 MED ORDER — OLANZAPINE 2.5 MG PO TABS: ORAL_TABLET | ORAL | 1 refills | Status: DC

## 2021-10-09 NOTE — Progress Notes (Signed)
[facility] MD/PA/NP Initial Note  10/09/2021 5:14 PM Danielle [name]  MRN:  [id number]  Chief Complaint:  Chief Complaint   Depression; Anxiety; Panic Attack; Patient Education; Medication Problem; Memory Loss      HPI:   "Danielle [name]", 76 year old female presents to this office for initial visit and to establish care.  Her husband Ed is present with her verbal consent.  On presentation she is extremely tearful and appears in distress.  She says she is having problems remembering everything from location of keys, 2 more important things.  She says she was previously getting care from [facility] but was unable to get appointment and timely manner.  She said that she is unsure of her medications and what she should be taking or correct dosage.  She and her husband present two 1 gallon plastic bags full of medications to be reconciled.  Many of the medications were medications that patient was no longer taking or possibly not correct dosage.  For safety, it was necessary to discontinue some of the medications that patient was more sure that she was not taking currently.  Patient states that she has been having frequent panic attacks, and constant crying.  Says that medications have not worked previously but she also has not been compliant because she is so confused about how to take her meds.  She and her husband both agreed that they would follow-up with primary care doctor promptly to further review maintenance medications.  Patient states that she is trying to find new provider to manage her medications and to help her with her severe anxiety and depression.  States that her anxiety today is 9/10, and her depression is 9/10.  States that her sleep is broken and often wakes up several times per night.  She is currently using CPAP.  She is very concerned about her memory but has been consulting with neurology.  States that neurology determined that she did not have Alzheimer's or progressing dementia.   Patient acknowledges that she has been experiencing visual hallucinations.  She said that while she was waiting in the lobby that she saw dogs or other people's pets that were not there.  Says that she sometimes sees images of people that are not present.  She denies auditory or command hallucinations.  Denies delirium.  Recent ER visit on 9/13 concluded UTI but was treated with AB. She has completed AB regimen.  Patient states that they will request records from [facility] be forwarded to this practice.  Patient cannot remember complete prior psychotropic medication trials: Prozac Pristiq Ativan Zoloft  Visit Diagnosis:    ICD-10-CM   1. Severe episode of recurrent major depressive disorder, with psychotic features (HCC)  F33.3 sertraline (ZOLOFT) 50 MG tablet    OLANZapine (ZYPREXA) 2.5 MG tablet    2. Generalized anxiety disorder  F41.1 sertraline (ZOLOFT) 50 MG tablet    OLANZapine (ZYPREXA) 2.5 MG tablet    3. Panic attack  F41.0 sertraline (ZOLOFT) 50 MG tablet    OLANZapine (ZYPREXA) 2.5 MG tablet    4. Hallucinations  R44.3 OLANZapine (ZYPREXA) 2.5 MG tablet      Past Psychiatric History: Anxiety, panic attacks, MDD  Past Medical History:  Past Medical History:  Diagnosis Date  . Arthritis 02/13/2011   osteoarthritis, hips, knees,s/p Cervical fusion(DDD)  . Atherosclerosis of aorta ([place]) 05/30/2015   on CTA chest  . CAD (coronary artery disease)    a. 09/2016: Coronary CT showing mid-LAD plaque with 20-50% associated stenosis but no  significant stenosis by FFR analysis.   . Dementia ([place])   . Depression with anxiety   . Diabetes mellitus without complication ([place])   . Difficult intubation 02/13/2011   with surgery -multiple times, no problems with recent surgeries  . GERD (gastroesophageal reflux disease) 02/13/2011   tx. Omeprazole  . Glaucoma 02/13/2011   bil. tx. eye drops daily  . Headache(784.0) 02/13/2011   past hx. migraines, none recent  . Heart murmur  02/13/2011   was told-no issues with this  . Hypercholesterolemia   . Hypertension 02/13/2011  . Hypothyroidism 02/13/2011   tx. with Levothyroxine  . Mitral regurgitation 06/01/2015   Mild  . Sinus drainage 02/13/2011   uses Zyrtec as needed  . Sleep apnea 02/13/2011   Hx. sleep apnea-uses cpap since 10'12 nightly    Past Surgical History:  Procedure Laterality Date  . ABDOMINAL HYSTERECTOMY  02/13/2011  . APPENDECTOMY  02/13/2011   Lap. removal '92  . BREAST CYST EXCISION Left   . CERVICAL FUSION  02/13/2011   '92- retained hardware  . CHOLECYSTECTOMY  02/13/2011   '98-lap. galbladder removal due to stones  . GUM SURGERY  02/13/2011   "small mouth"  . JOINT REPLACEMENT  02/13/2011   RTHA-a yr ago in [place]  . MASS EXCISION Left 08/15/2019   Procedure: EXCISION OF SEBACEOUS CYST LEFT BREAST;  Surgeon: Georganna Skeans, MD;  Location: [facility];  Service: General;  Laterality: Left;  . TONSILLECTOMY  02/13/2011   child  . TOTAL HIP ARTHROPLASTY  02/17/2011   Procedure: TOTAL HIP ARTHROPLASTY ANTERIOR APPROACH;  Surgeon: Mauri Pole, MD;  Location: [facility];  Service: Orthopedics;  Laterality: Left;  . TUBAL LIGATION  02/13/2011    Family Psychiatric History: see chart  Family History:  Family History  Problem Relation Age of Onset  . Cancer Mother        lung  . Breast cancer Maternal Aunt     Social History:  Social History   Socioeconomic History  . Marital status: Married    Spouse name: Not on file  . Number of children: Not on file  . Years of education: Not on file  . Highest education level: Not on file  Occupational History  . Occupation: Retired Not working  Tobacco Use  . Smoking status: Never  . Smokeless tobacco: Never  Vaping Use  . Vaping Use: Never used  Substance and Sexual Activity  . Alcohol use: Yes    Comment: rare  . Drug use: No  . Sexual activity: Yes    Birth control/protection: Surgical    Comment: hyst   Other Topics Concern  . Not on file  Social History Narrative   Caffeine le-ss then once cup daily. Education: HS diploma.  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     Social Determinants of Health   Financial Resource Strain: Not on file  Food Insecurity: Not on file  Transportation Needs: Not on file  Physical Activity: Not on file  Stress: Not on file  Social Connections: Not on file    Allergies:  Allergies  Allergen Reactions  . Ceftin [Cefuroxime Axetil] Hives and Swelling    Swelling of face  . Codeine Nausea Only  . Compazine [Prochlorperazine] Swelling and Other (See Comments)    Face and tongue swelling    Metabolic Disorder Labs: Lab Results  Component Value Date   HGBA1C 5.6 10/08/2016   MPG 114.02 10/08/2016   No results  found for: "PROLACTIN" Lab Results  Component Value Date   CHOL 170 10/08/2016   TRIG 105 10/08/2016   HDL 63 10/08/2016   CHOLHDL 2.7 10/08/2016   VLDL 21 10/08/2016   LDLCALC 86 10/08/2016   LDLCALC 76 05/31/2015   Lab Results  Component Value Date   TSH 0.654 12/18/2020   TSH 0.446 10/07/2016    Therapeutic Level Labs: No results found for: "LITHIUM" No results found for: "VALPROATE" No results found for: "CBMZ"  Current Medications: Current Outpatient Medications  Medication Sig Dispense Refill  . CALCIUM-VITAMIN D PO Take 1 capsule by mouth daily. 1200-1000 calcium-vitamin d    . Cholecalciferol (VITAMIN D3) 25 MCG (1000 UT) CAPS 1 capsule    . fluticasone (FLONASE) 50 MCG/ACT nasal spray 2 spray in each nostril    . hydrochlorothiazide (HYDRODIURIL) 25 MG tablet Take 25 mg by mouth daily.    . hydrOXYzine (ATARAX) 25 MG tablet Take 1 tablet (25 mg total) by mouth every 6 (six) hours as needed for anxiety. 12 tablet 0  . hydrOXYzine (ATARAX) 25 MG tablet Take 1 tablet (25 mg total) by mouth 3 (three) times daily as needed. 30 tablet 0  . levothyroxine (SYNTHROID, LEVOTHROID) 137 MCG tablet Take 137  mcg by mouth daily before breakfast.    . losartan (COZAAR) 50 MG tablet Take 50 mg by mouth daily.    . metFORMIN (GLUCOPHAGE-XR) 500 MG 24 hr tablet Take 500 mg by mouth daily with breakfast.    . metoprolol tartrate (LOPRESSOR) 25 MG tablet TAKE 1/2 TO 1 TABLET IN THE MORNING AND AT BEDTIME FOR PALPITATIONS. NEED APPOINTMENT FOR FURTHER REFILLS 15 tablet 0  . OLANZapine (ZYPREXA) 2.5 MG tablet Take one tablet 2.5 mg for 7 days, then take two tablets 5 mg at bedtime daily. 60 tablet 1  . ondansetron (ZOFRAN) 4 MG tablet Take 4 mg by mouth every 8 (eight) hours as needed.    . rosuvastatin (CRESTOR) 40 MG tablet Take 1 tablet (40 mg total) by mouth daily. 90 tablet 3  . sertraline (ZOLOFT) 50 MG tablet Take 1 tablet (50 mg total) by mouth daily. Take 1/2 tablet 25 mg for 7 days, then take one whole tablet 50 mg total daily after breakfast 30 tablet 1  . Vitamin D, Ergocalciferol, (DRISDOL) 1.25 MG (50000 UNIT) CAPS capsule Take 50,000 Units by mouth once a week.    Marland Kitchen LORazepam (ATIVAN) 1 MG tablet Take 1 mg by mouth 2 (two) times daily.    . Magnesium 100 MG CAPS Take 100 mg by mouth daily. (Patient not taking: Reported on 10/09/2021)    . pantoprazole (PROTONIX) 40 MG tablet Take 1 tablet (40 mg total) by mouth 2 (two) times daily. (Patient taking differently: Take 40 mg by mouth daily.) 60 tablet 0  . prednisoLONE acetate (PRED FORTE) 1 % ophthalmic suspension prednisolone acetate 1 % eye drops,suspension    . rosuvastatin (CRESTOR) 40 MG tablet Take 1 tablet by mouth daily.     No current facility-administered medications for this visit.    Medication Side Effects: none  Orders placed this visit:  No orders of the defined types were placed in this encounter.   Psychiatric Specialty Exam:  Review of Systems  Constitutional: Negative.   Allergic/Immunologic: Negative.   Neurological: Negative.   Psychiatric/Behavioral:  Positive for decreased concentration, dysphoric mood and sleep  disturbance. The patient is nervous/anxious.     There were no vitals taken for this visit.There is no height  or weight on file to calculate BMI.  General Appearance: Casual and Neat  Eye Contact:  Fair  Speech:  Pressured  Volume:  Increased  Mood:  Anxious, Depressed, and Dysphoric  Affect:  Congruent, Depressed, Tearful, and Anxious  Thought Process:  Coherent  Orientation:  Full (Time, Place, and Person)  Thought Content: Logical   Suicidal Thoughts:  No  Homicidal Thoughts:  No  Memory:  WNL  Judgement:  Good  Insight:  Good  Psychomotor Activity:  Normal  Concentration:  Concentration: Good  Recall:  Good  Fund of Knowledge: Good  Language: Good  Assets:  Desire for Improvement  ADL's:  Intact  Cognition: WNL  Prognosis:  Good   Screenings:  Mini-Mental    Flowsheet Row Office Visit from 12/18/2020 in [facility]  Total Score (max 30 points ) 29      PHQ2-9    [place] Office Visit from 10/09/2021 in [facility]  PHQ-2 Total Score 6  PHQ-9 Total Score 21      Flowsheet Row ED from 09/29/2021 in [facility] from 09/27/2021 in [facility] from 09/24/2021 in [facility] High Risk No Risk No Risk       Receiving Psychotherapy: No   Treatment Plan/Recommendations:  Greater than 50% of  60  min face to face time with patient was spent on counseling and coordination of care.  We discussed her long history of anxiety and depression.  Her husband is present with her consent to assist in interview.  She is very upset with continuous crying.  She is extremely confused about her medication regimen.  Her and her husband present to full 1 gallon plastic bags full of medication.  I informed the patient that I would need a copy of her previous psychiatric care from [facility].  Collateral information was unreliable and she was  unsure of previous medications or current medications.  She and her husband were not sure of dosages or whether or not patient was taking medication correctly.  I spent approximately 90 minutes with patient trying to reconcile and review all of her medications.  I recommended that she follow-up as soon as possible with PCP to further review her non psychotropic medications.  Due to information being unreliable and patient's current condition I decided to change her antidepressant and start fresh.  I also decided to add olanzapine for her visual hallucinations,  severe anxiety, and reported insomnia.  I further requested patient and her husband [facility] to [facility] after this appointment and request a copy of her records be faxed to this office.  Patient's husband states that they have been following up with neurology and that neurologist told them that patient did not have Alzheimer's or suspect dementia at this time.  I further requested copy of report to be faxed to this office.  They said that she was diagnosed with something that they could not remember. I reviewed medical hx that was available.   We agreed:  Will start Zoloft 25 mg for 7 days, then 50 mg daily until next visit. To start olanzapine 2.5 mg at bedtime for 7 days, then 5 mg daily at bedtime until next visit. We will continue to utilize Ativan 1 mg twice daily as needed for severe anxiety until next visit. To report worsening symptoms or side effects promptly Provided emergency contact information To follow-up in 4 weeks to reassess  progress Discussed potential metabolic side effects associated with atypical antipsychotics, as well as potential risk for movement side effects. Advised pt to contact office if movement side effects occur.   Discussed potential benefits, risk, and side effects of benzodiazepines to include potential risk of tolerance and dependence, as well as possible drowsiness.  Advised patient not to drive if  experiencing drowsiness and to take lowest possible effective dose to minimize risk of dependence and tolerance.  Reviewed PDMP    Elwanda Brooklyn, NP

## 2021-10-16 ENCOUNTER — Ambulatory Visit: Payer: Medicare HMO | Admitting: Psychology

## 2021-10-16 ENCOUNTER — Encounter: Payer: Self-pay | Admitting: Neurology

## 2021-10-16 ENCOUNTER — Ambulatory Visit (INDEPENDENT_AMBULATORY_CARE_PROVIDER_SITE_OTHER): Payer: Medicare HMO | Admitting: Neurology

## 2021-10-16 VITALS — BP 108/60 | HR 62 | Ht 62.0 in | Wt 205.6 lb

## 2021-10-16 DIAGNOSIS — G4733 Obstructive sleep apnea (adult) (pediatric): Secondary | ICD-10-CM

## 2021-10-16 DIAGNOSIS — F411 Generalized anxiety disorder: Secondary | ICD-10-CM | POA: Diagnosis not present

## 2021-10-16 DIAGNOSIS — F32A Depression, unspecified: Secondary | ICD-10-CM | POA: Diagnosis not present

## 2021-10-16 DIAGNOSIS — R4189 Other symptoms and signs involving cognitive functions and awareness: Secondary | ICD-10-CM | POA: Diagnosis not present

## 2021-10-16 DIAGNOSIS — R419 Unspecified symptoms and signs involving cognitive functions and awareness: Secondary | ICD-10-CM

## 2021-10-16 NOTE — Progress Notes (Signed)
[facility]    Provider:  Dr Jaynee Eagles Requesting Provider: [name],* Primary Care Provider:  [name], MD  CC:  Memory loss  Follow up: Patient here for follow up of memory loss EEG negative, Formal neurocognitive testing NOT consistent with neurodegenerative disease. She did not have the MRI of the brain I ordered completed. Sed, ammonia, rpr, homocysteine, vitamin d, b1, mma, b12, folate, cmp, cbc all unremarkable. Was seen in the ED 09/2021 for panic attack. We discussed she has no neurodegenerative disease per formal memory testing.This is likely anxiety and psychosicial stressors. She has also been tested for sleep apnea and this could causing many of her symptoms including her anxiety.  Dr. Sima Matas Formal Neurocognitive testing 07/2021:  As far as diagnostic considerations, this pattern is clearly not consistent with those typically seen with progressive cortical dementia such as Alzheimer's or Lewy body type symptoms.  While the patient has had a very limited number of visual experiences similar to visual hallucination these are all slowly around times where sleep related disturbance could explain these limited events.  The patient shows no tremors per se as well.  The patient did well on multiple items that would typically be some of the early symptoms that change in a progressive cortical dementia.  Her symptoms are not consistent with subcortical dementia's.  The patient has had good laboratory work-ups particularly for B vitamin studies and other lab work has been good.  I do think that it would be pertinent to get neuroimaging studies like MRI which at this point have not been completed.  The patient was referred for an MRI of the brain by Dr. Jaynee Eagles previously but this was never completed.  Considering the possible lateralization of findings on neuropsychological testing it would be worthwhile having this study completed.  There may be some  potential vascular involvement and her symptoms.  The patient has not had any imaging since 2012 when she had a CT study done after developing significant headaches following a time of riding on a roller coaster with a family history of aneurysms.  The patient also is experiencing significant increase in her anxiety symptoms and panic events and I suspect that the most likely cause of her increasing symptoms and subjective reports of word finding difficulties, memory difficulties, fear of getting lost etc. are most likely attributed to a worsening anxiety disorder.  Combining that with poor sleep patterns could explain many of her subjective reports although they would not explain the lateralization of findings on neuropsychological test data.  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  Looking at specific test items that she had the most difficulty on the tended to be ones that required more stamina and persistence to complete and therefore this may be an artifact of her apprehension and emotional distress and taking these measures rather than directly related to them being inherently more problematic for her cognitively.  In any event, the most likely culprit for some of her subjective symptoms are likely to be psychiatric in nature specifically significant increases in anxiety and exacerbation and worsening of her panic disorder.  We cannot rule out particular cerebrovascular events but those would need to be confirmed or disproven by neuro imaging.  As far as treatment recommendations, I do think that the most important variable that needs to be addressed is her significant increase in panic attacks and anxiety symptoms.  She has been  prescribed alprazolam previously and was also taking Pristiq at least in 2022.  I am not sure if she is continuing to take any particular SSRI medication.  As  Pristiq is more of an SNRI type medication if she is continuing to take that with increasing and worsening panic disorder I would suggest possibly considering a more focused SSRI strategy with medicines such as Prozac or Lexapro.  The patient would also likely benefit from psychotherapeutic interventions around building better coping skills to manage her anxiety disorder and find ways to reduce the frequency, it intensity and duration of panic events.  We should also follow through with the MRI studies.   Patient complains of symptoms per HPI as well as the following symptoms: panic attack . Pertinent negatives and positives per HPI. All others negative   HPI 12/18/2020:  Danielle [name] is a 76 y.o. female here as requested by [name],* for memory loss. PMHx arthritis, aortic atherosclerosis, CAD, depression with anxiety, headache, heart murmur, hld, htn,   Started within the last 1.5-2 years. Started with not remembering words or people's names. Slowly progressive. Husband accompanies and provides much information. Worsening last 6 months more progressive, it upsets her so much, she doesn't like driving because she gets lost on roads she knows or misses her turns, she will ask the same questions in the same day, sometimes she needs to stop and tell people she doesn't remember, she gets anxious when she is trying to do things like paperwork, finances, organization is hard, she takes her own medications but sometimes worries if she takes them correctly, less social. She is compliant with her cpap, husband provides a lot of info, he sees a lot of depression, she has nightmares that she acts out, husband says she yells out and thrashes. She denies hallucinations or delusions. Appears she performs her own IADLs and ADLs, and husband says she may "zone out". Husband thinks it is a lot of depression, she gets frustrated with these episodes, she is compliant with psychiatry. She is less social. A  few of her maternal aunts maybe had memory loss/dementia but her mother died in her 33s without problems. She gets frustrated and angry when these things happen. No other focal neurologic deficits, associated symptoms, inciting events or modifiable factors.   Reviewed notes, labs and imaging from outside physicians, which showed:  Ct head 2012: reviewed images and agree: CT HEAD WITHOUT CONTRAST   Technique:  Contiguous axial images were obtained from the base of  the skull through the vertex without contrast.   Comparison: None.   Findings: No CT evidence of intracranial hemorrhage.   No CT evidence of large acute infarct.  Small acute infarct cannot  be excluded by CT.   No intracranial mass lesion detected on this unenhanced exam.   No hydrocephalus.   No obvious intracranial aneurysm although evaluation by unenhanced  head CT is limited.  If this is of high clinical concern then MR  angiogram of the circle Willis can be obtained as a screening  evaluation for detection of such.   Visualized sinuses and mastoid air cells clear.  No calvarial  destructive lesion.   IMPRESSION:  No acute abnormality.  Please see above. (Per my read also some chronic microvascular changes)  Review of Systems: Patient complains of symptoms per HPI as well as the following symptoms obesity. Pertinent negatives and positives per HPI. All others negative.   Social History   Socioeconomic History   Marital status: Married  Spouse name: Not on file   Number of children: Not on file   Years of education: Not on file   Highest education level: Not on file  Occupational History   Occupation: Retired Not working  Tobacco Use   Smoking status: Never   Smokeless tobacco: Never  Vaping Use   Vaping Use: Never used  Substance and Sexual Activity   Alcohol use: Yes    Comment: rare   Drug use: No   Sexual activity: Yes    Birth control/protection: Surgical    Comment: hyst  Other Topics  Concern   Not on file  Social History Narrative   Caffeine le-ss then once cup daily. Education: HS diploma.  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     Social Determinants of Health   Financial Resource Strain: Not on file  Food Insecurity: Not on file  Transportation Needs: Not on file  Physical Activity: Not on file  Stress: Not on file  Social Connections: Not on file  Intimate Partner Violence: Not on file    Family History  Problem Relation Age of Onset   Cancer Mother        lung   Breast cancer Maternal Aunt    Alzheimer's disease Neg Hx    Dementia Neg Hx     Past Medical History:  Diagnosis Date   Arthritis 02/13/2011   osteoarthritis, hips, knees,s/p Cervical fusion(DDD)   Atherosclerosis of aorta ([place]) 05/30/2015   on CTA chest   CAD (coronary artery disease)    a. 09/2016: Coronary CT showing mid-LAD plaque with 20-50% associated stenosis but no significant stenosis by FFR analysis.    Dementia ([place])    Depression with anxiety    Diabetes mellitus without complication ([place])    Difficult intubation 02/13/2011   with surgery -multiple times, no problems with recent surgeries   GERD (gastroesophageal reflux disease) 02/13/2011   tx. Omeprazole   Glaucoma 02/13/2011   bil. tx. eye drops daily   Headache(784.0) 02/13/2011   past hx. migraines, none recent   Heart murmur 02/13/2011   was told-no issues with this   Hypercholesterolemia    Hypertension 02/13/2011   Hypothyroidism 02/13/2011   tx. with Levothyroxine   Mitral regurgitation 06/01/2015   Mild   Sinus drainage 02/13/2011   uses Zyrtec as needed   Sleep apnea 02/13/2011   Hx. sleep apnea-uses cpap since 10'12 nightly    Patient Active Problem List   Diagnosis Date Noted   Generalized anxiety disorder 10/16/2021   Subjective memory complaints 10/16/2021   Memory loss 12/22/2020   Alteration of awareness 12/22/2020   Palpitations 08/17/2019   Calcification of native  coronary artery 10/20/2016   Morbid obesity ([place])    Chest pain 10/07/2016   Hyperglycemia 10/07/2016   Acute respiratory failure with hypoxia ([place]) 05/30/2015   Atypical pneumonia 05/30/2015   Dyspnea 05/30/2015   Hyperlipidemia LDL goal <70    Gastroesophageal reflux disease without esophagitis    Depression    Tibial plateau fracture 05/16/2015   Closed tibial fracture 05/16/2015   Sebaceous cyst of breast 07/27/2013   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 removal '92   BREAST CYST EXCISION Left  CERVICAL FUSION  02/13/2011   '92- retained hardware   CHOLECYSTECTOMY  02/13/2011   '98-lap. galbladder removal due to stones   GUM SURGERY  02/13/2011   "small mouth"   JOINT REPLACEMENT  02/13/2011   RTHA-a yr ago in [place] Left 08/15/2019   Procedure: EXCISION OF SEBACEOUS CYST LEFT BREAST;  Surgeon: Georganna Skeans, MD;  [facility];  Service: General;  Laterality: Left;   TONSILLECTOMY  02/13/2011   child   TOTAL HIP ARTHROPLASTY  02/17/2011   Procedure: TOTAL HIP ARTHROPLASTY ANTERIOR APPROACH;  Surgeon: Mauri Pole, MD;  Location: [facility];  Service: Orthopedics;  Laterality: Left;   TUBAL LIGATION  02/13/2011    Current Outpatient Medications  Medication Sig Dispense Refill   BIOTIN PO Take 5,000 mcg by mouth.     CALCIUM-VITAMIN D PO Take 1 capsule by mouth daily. 1200-1000 calcium-vitamin d     fluticasone (FLONASE) 50 MCG/ACT nasal spray as needed.     hydrochlorothiazide (HYDRODIURIL) 25 MG tablet Take 25 mg by mouth daily.     hydrOXYzine (ATARAX) 25 MG tablet Take 1 tablet (25 mg total) by mouth every 6 (six) hours as needed for anxiety. 12 tablet  0   hydrOXYzine (ATARAX) 25 MG tablet Take 1 tablet (25 mg total) by mouth 3 (three) times daily as needed. 30 tablet 0   levothyroxine (SYNTHROID, LEVOTHROID) 137 MCG tablet Take 137 mcg by mouth daily before breakfast.     LORazepam (ATIVAN) 1 MG tablet Take 1 mg by mouth 2 (two) times daily.     losartan (COZAAR) 50 MG tablet Take 50 mg by mouth daily.     Magnesium 100 MG CAPS Take 100 mg by mouth daily.     metFORMIN (GLUCOPHAGE-XR) 500 MG 24 hr tablet Take 500 mg by mouth daily with breakfast.     metoprolol tartrate (LOPRESSOR) 25 MG tablet TAKE 1/2 TO 1 TABLET IN THE MORNING AND AT BEDTIME FOR PALPITATIONS. NEED APPOINTMENT FOR FURTHER REFILLS 15 tablet 0   OLANZapine (ZYPREXA) 2.5 MG tablet Take one tablet 2.5 mg for 7 days, then take two tablets 5 mg at bedtime daily. 60 tablet 1   ondansetron (ZOFRAN) 4 MG tablet Take 4 mg by mouth every 8 (eight) hours as needed.     prednisoLONE acetate (PRED FORTE) 1 % ophthalmic suspension prednisolone acetate 1 % eye drops,suspension     rosuvastatin (CRESTOR) 40 MG tablet Take 1 tablet (40 mg total) by mouth daily. 90 tablet 3   rosuvastatin (CRESTOR) 40 MG tablet Take 1 tablet by mouth daily.     sertraline (ZOLOFT) 50 MG tablet Take 1 tablet (50 mg total) by mouth daily. Take 1/2 tablet 25 mg for 7 days, then take one whole tablet 50 mg total daily after breakfast 30 tablet 1   Vitamin D, Ergocalciferol, (DRISDOL) 1.25 MG (50000 UNIT) CAPS capsule Take 50,000 Units by mouth once a week.     No current facility-administered medications for this visit.    Allergies as of 10/16/2021 - Review Complete 10/16/2021  Allergen Reaction Noted   Ceftin [cefuroxime axetil] Hives and Swelling 02/13/2011   Codeine Nausea Only 12/05/2010   Compazine [prochlorperazine] Swelling and Other (See Comments) 12/05/2010    Vitals: BP 108/60   Pulse 62   Ht '5\' 2"'$  (1.575 m)   Wt 205 lb 9.6 oz (93.3 kg)   BMI 37.60 kg/m  Last Weight:  Wt Readings from Last  1  Encounters:  10/16/21 205 lb 9.6 oz (93.3 kg)   Last Height:   Ht Readings from Last 1 Encounters:  10/16/21 '5\' 2"'$  (1.575 m)    Exam: NAD, pleasant                  Speech:    Speech is normal; fluent and spontaneous with normal comprehension.  Cognition:     12/18/2020    9:11 AM  MMSE - Mini Mental State Exam  Orientation to time 5  Orientation to Place 5  Registration 3  Attention/ Calculation 5  Recall 3  Language- name 2 objects 2  Language- repeat 1  Language- follow 3 step command 3  Language- read & follow direction 1  Write a sentence 1  Copy design 0  Total score 29       The patient is oriented to person, place, and time;     recent and remote memory intact;     language fluent;    Cranial Nerves:    The pupils are equal, round, and reactive to light.Trigeminal sensation is intact and the muscles of mastication are normal. The face is symmetric. The palate elevates in the midline. Hearing intact. Voice is normal. Shoulder shrug is normal. The tongue has normal motion without fasciculations.   Coordination:  No dysmetria  Motor Observation:    No asymmetry, no atrophy, and no involuntary movements noted. Tone:    Normal muscle tone.     Strength:    Strength is V/V in the upper and lower limbs.      Sensation: intact to LT      Assessment/Plan:  Patient here with complaints of cognitive decline/memory loss. MMSE 29/30. Patient here for follow up of memory loss EEG negative, Formal neurocognitive testing NOT consistent with neurodegenerative disease. She did not have the MRI of the brain I ordered completed. Sed, ammonia, rpr, homocysteine, vitamin d, b1, mma, b12, folate, cmp, cbc all unremarkable. Was seen in the ED 09/2021 for panic attack. We discussed she has no neurodegenerative disease per formal memory testing.This is likely anxiety and depresion and psychosicial stressors. She has also been re-tested for sleep apnea.  Dr. Sima Matas Formal  Neurocognitive testing 07/2021:  As far as diagnostic considerations, this pattern is clearly not consistent with those typically seen with progressive cortical dementia such as Alzheimer's or Lewy body type symptoms..... Her symptoms are not consistent with subcortical dementia's...Marland KitchenMarland KitchenMarland Kitchen The patient also is experiencing significant increase in her anxiety symptoms and panic events and I suspect that the most likely cause of her increasing symptoms and subjective reports of word finding difficulties, memory difficulties, fear of getting lost etc. are most likely attributed to a worsening anxiety disorder.  Combining that with poor sleep patterns could explain many of her subjective reports although they would not explain the lateralization of findings on neuropsychological test data.  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  Looking at specific test items that she had the most difficulty on the tended to be ones that required more stamina and persistence to complete and therefore this may be an artifact of her apprehension and emotional distress and taking these measures rather than directly related to them being inherently more problematic for her cognitively.  In any event, the most likely culprit for some of her subjective symptoms are likely to be psychiatric in nature specifically significant increases in anxiety and exacerbation  and worsening of her panic disorder.  We cannot rule out particular cerebrovascular events but those would need to be confirmed or disproven by neuro imaging.  As far as treatment recommendations, I do think that the most important variable that needs to be addressed is her significant increase in panic attacks and anxiety symptoms.... The patient would also likely benefit from psychotherapeutic interventions around building better coping skills to manage her anxiety  disorder and find ways to reduce the frequency, it intensity and duration of panic events.  We should also follow through with the MRI studies.   - MRI of the brain and blood work for reversible causes of dementia: will reorder  - Memory testing: neurcognitive formal not consistent with any neurodegenerative disease  - OSA; She is compliant with her cpap, husband provides a lot of info, he sees a lot of depression, she has nightmares that she acts out, husband says she yells out and thrashes. She sees Dr. Roylene Reason says she just had a repeat sleep test  - husband says she may "zone out": EEG normal  - she is not seeing lisa poulos, continue seeing behavioral health here at [facility] This Encounter  Procedures   MR BRAIN W WO CONTRAST     Cc: Rodman Pickle, MD  Sarina Ill, MD  [facility] [address] [place], [place] [address]  Phone [phone] Fax [phone]  I spent 30 minutes of face-to-face and non-face-to-face time with patient on the  1. Subjective memory complaints   2. Generalized anxiety disorder   3. OSA (obstructive sleep apnea)   4. Alteration of awareness   5. Depression, unspecified depression type    diagnosis.  This included previsit chart review, lab review, study review, order entry, electronic health record documentation, patient education on the different diagnostic and therapeutic options, counseling and coordination of care, risks and benefits of management, compliance, or risk factor reduction

## 2021-10-16 NOTE — Patient Instructions (Signed)
[facility] brain  Major Depressive Disorder, Adult Major depressive disorder (MDD) is a mental health condition. It may also be called clinical depression or unipolar depression. MDD causes symptoms of sadness, hopelessness, and loss of interest in things. These symptoms last most of the day, almost every day, for 2 weeks. MDD can also cause physical symptoms. It can interfere with relationships and with everyday activities, such as work, school, and activities that are usually pleasant. MDD may be mild, moderate, or severe. It may be single-episode MDD, which happens once, or recurrent MDD, which may occur multiple times. What are the causes? The exact cause of this condition is not known. MDD is most likely caused by a combination of things, which may include: Your personality traits. Learned or conditioned behaviors or thoughts or feelings that reinforce negativity. Any alcohol or substance misuse. Long-term (chronic) physical or mental health illness. Going through a traumatic experience or major life changes. What increases the risk? The following factors may make someone more likely to develop MDD: A family history of depression. Being a woman. Troubled family relationships. Abnormally low levels of certain brain chemicals. Traumatic or painful events in childhood, especially abuse or loss of a parent. A lot of stress from life experiences, such as poor living conditions or discrimination. Chronic physical illness or other mental health disorders. What are the signs or symptoms? The main symptoms of MDD usually include: Constant depressed or irritable mood. A loss of interest in things and activities. Other symptoms include: Sleeping or eating too much or too little. Unexplained weight gain or weight loss. Tiredness or low energy. Being agitated, restless, or weak. Feeling hopeless, worthless, or guilty. Trouble thinking clearly or making decisions. Thoughts of suicide or thoughts of  harming others. Isolating oneself or avoiding other people or activities. Trouble completing tasks, work, or any normal obligations. Severe symptoms of this condition may include: Psychotic depression.This may include false beliefs, or delusions. It may also include seeing, hearing, tasting, smelling, or feeling things that are not real (hallucinations). Chronic depression or persistent depressive disorder. This is low-level depression that lasts for at least 2 years. Melancholic depression, or feeling extremely sad and hopeless. Catatonic depression, which includes trouble speaking and trouble moving. How is this diagnosed? This condition may be diagnosed based on: Your symptoms. Your medical and mental health history. You may be asked questions about your lifestyle, including any drug and alcohol use. A physical exam. Blood tests to rule out other conditions. MDD is confirmed if you have the following symptoms most of the day, nearly every day, in a 2-week period: Either a depressed mood or loss of interest. At least four other MDD symptoms. How is this treated? This condition is usually treated by mental health professionals, such as psychologists, psychiatrists, and clinical social workers. You may need more than one type of treatment. Treatment may include: Psychotherapy, also called talk therapy or counseling. Types of psychotherapy include: Cognitive behavioral therapy (CBT). This teaches you to recognize unhealthy feelings, thoughts, and behaviors, and replace them with positive thoughts and actions. Interpersonal therapy (IPT). This helps you to improve the way you communicate with others or relate to them. Family therapy. This treatment includes members of your family. Medicines to treat anxiety and depression. These medicines help to balance the brain chemicals that affect your emotions. Lifestyle changes. You may be asked to: Limit alcohol use and avoid drug use. Get regular  exercise. Get plenty of sleep. Make healthy eating choices. Spend more time outdoors. Brain  stimulation. This may be done if symptoms are very severe and other treatments have not worked. Examples of this treatment are electroconvulsive therapy and transcranial magnetic stimulation. Follow these instructions at home: Activity Exercise regularly and spend time outdoors. Find activities that you enjoy doing, and make time to do them. Find healthy ways to manage stress, such as: Meditation or deep breathing. Spending time in nature. Journaling. Return to your normal activities as told by your health care provider. Ask your health care provider what activities are safe for you. Alcohol and drug use If you drink alcohol: Limit how much you use to: 0-1 drink a day for women who are not pregnant. 0-2 drinks a day for men. Be aware of how much alcohol is in your drink. In the [place] one drink equals one 12 oz bottle of beer (355 mL), one 5 oz glass of wine (148 mL), or one 1 oz glass of hard liquor (44 mL). Discuss your alcohol use with your health care provider. Alcohol can affect any antidepressant medicines you are taking. Discuss any drug use with your health care provider. General instructions  Take over-the-counter and prescription medicines only as told by your health care provider. Eat a healthy diet and get plenty of sleep. Consider joining a support group. Your health care provider may be able to recommend one. Keep all follow-up visits as told by your health care provider. This is important. Where to find more information [employer] on Mental Illness: www.nami.[place]: https://carter.com/ Contact a health care provider if: Your symptoms get worse. You develop new symptoms. Get help right away if: You self-harm. You have serious thoughts about hurting yourself or others. You hallucinate. If you ever feel like you may hurt yourself or  others, or have thoughts about taking your own life, get help right away. Go to your nearest emergency department or: Call your local emergency services (911 in the [place] Call a suicide crisis helpline, such as the [facility] at 705-572-0074 or 988 in the [place]. This is open 24 hours a day in the [place] Text the Crisis Text Line at 705-543-0607 (in the [place] Summary Major depressive disorder (MDD) is a mental health condition. MDD causes symptoms of sadness, hopelessness, and loss of interest in things. These symptoms last most of the day, almost every day, for 2 weeks. The symptoms of MDD can interfere with relationships and with everyday activities. Treatments and support are available for people who develop MDD. You may need more than one type of treatment. Get help right away if you have serious thoughts about hurting yourself or others. This information is not intended to replace advice given to you by your health care provider. Make sure you discuss any questions you have with your health care provider. Document Revised: 07/24/2020 Document Reviewed: 12/10/2018 Elsevier Patient Education  [place].

## 2021-10-17 ENCOUNTER — Telehealth: Payer: Self-pay | Admitting: Neurology

## 2021-10-17 NOTE — Telephone Encounter (Signed)
Mcarthur Rossetti Josem Kaufmann: [phone] exp. 10/17/21-11/16/21 [facility] sent to [facility] [phone]

## 2021-10-21 ENCOUNTER — Ambulatory Visit: Payer: Medicare HMO | Admitting: Psychology

## 2021-10-22 DIAGNOSIS — Z03818 Encounter for observation for suspected exposure to other biological agents ruled out: Secondary | ICD-10-CM | POA: Diagnosis not present

## 2021-10-22 DIAGNOSIS — N390 Urinary tract infection, site not specified: Secondary | ICD-10-CM | POA: Diagnosis not present

## 2021-10-22 DIAGNOSIS — J019 Acute sinusitis, unspecified: Secondary | ICD-10-CM | POA: Diagnosis not present

## 2021-10-27 ENCOUNTER — Inpatient Hospital Stay (HOSPITAL_COMMUNITY)
Admission: EM | Admit: 2021-10-27 | Discharge: 2021-10-31 | DRG: 641 | Disposition: A | Discharge status: Home Health | Payer: Medicare HMO | Attending: Internal Medicine | Admitting: Internal Medicine

## 2021-10-27 ENCOUNTER — Emergency Department (HOSPITAL_COMMUNITY): Payer: Medicare HMO

## 2021-10-27 ENCOUNTER — Other Ambulatory Visit: Payer: Self-pay

## 2021-10-27 DIAGNOSIS — S0990XA Unspecified injury of head, initial encounter: Secondary | ICD-10-CM | POA: Diagnosis not present

## 2021-10-27 DIAGNOSIS — F418 Other specified anxiety disorders: Secondary | ICD-10-CM | POA: Diagnosis not present

## 2021-10-27 DIAGNOSIS — I2510 Atherosclerotic heart disease of native coronary artery without angina pectoris: Secondary | ICD-10-CM | POA: Diagnosis present

## 2021-10-27 DIAGNOSIS — E039 Hypothyroidism, unspecified: Secondary | ICD-10-CM | POA: Diagnosis present

## 2021-10-27 DIAGNOSIS — I959 Hypotension, unspecified: Secondary | ICD-10-CM | POA: Diagnosis not present

## 2021-10-27 DIAGNOSIS — S32030A Wedge compression fracture of third lumbar vertebra, initial encounter for closed fracture: Secondary | ICD-10-CM | POA: Diagnosis not present

## 2021-10-27 DIAGNOSIS — E861 Hypovolemia: Principal | ICD-10-CM | POA: Diagnosis present

## 2021-10-27 DIAGNOSIS — F32A Depression, unspecified: Secondary | ICD-10-CM | POA: Diagnosis present

## 2021-10-27 DIAGNOSIS — W182XXA Fall in (into) shower or empty bathtub, initial encounter: Secondary | ICD-10-CM | POA: Diagnosis present

## 2021-10-27 DIAGNOSIS — Z79890 Hormone replacement therapy (postmenopausal): Secondary | ICD-10-CM

## 2021-10-27 DIAGNOSIS — K219 Gastro-esophageal reflux disease without esophagitis: Secondary | ICD-10-CM | POA: Diagnosis present

## 2021-10-27 DIAGNOSIS — E669 Obesity, unspecified: Secondary | ICD-10-CM | POA: Diagnosis present

## 2021-10-27 DIAGNOSIS — Z96642 Presence of left artificial hip joint: Secondary | ICD-10-CM | POA: Diagnosis present

## 2021-10-27 DIAGNOSIS — F05 Delirium due to known physiological condition: Secondary | ICD-10-CM | POA: Diagnosis not present

## 2021-10-27 DIAGNOSIS — Y92012 Bathroom of single-family (private) house as the place of occurrence of the external cause: Secondary | ICD-10-CM

## 2021-10-27 DIAGNOSIS — J811 Chronic pulmonary edema: Secondary | ICD-10-CM | POA: Diagnosis not present

## 2021-10-27 DIAGNOSIS — I9589 Other hypotension: Secondary | ICD-10-CM | POA: Diagnosis not present

## 2021-10-27 DIAGNOSIS — M1611 Unilateral primary osteoarthritis, right hip: Secondary | ICD-10-CM | POA: Diagnosis present

## 2021-10-27 DIAGNOSIS — I340 Nonrheumatic mitral (valve) insufficiency: Secondary | ICD-10-CM | POA: Diagnosis present

## 2021-10-27 DIAGNOSIS — G4733 Obstructive sleep apnea (adult) (pediatric): Secondary | ICD-10-CM | POA: Diagnosis present

## 2021-10-27 DIAGNOSIS — M545 Low back pain, unspecified: Secondary | ICD-10-CM | POA: Diagnosis not present

## 2021-10-27 DIAGNOSIS — R443 Hallucinations, unspecified: Secondary | ICD-10-CM | POA: Diagnosis present

## 2021-10-27 DIAGNOSIS — W19XXXA Unspecified fall, initial encounter: Principal | ICD-10-CM

## 2021-10-27 DIAGNOSIS — E119 Type 2 diabetes mellitus without complications: Secondary | ICD-10-CM | POA: Diagnosis present

## 2021-10-27 DIAGNOSIS — R296 Repeated falls: Secondary | ICD-10-CM | POA: Diagnosis present

## 2021-10-27 DIAGNOSIS — F0394 Unspecified dementia, unspecified severity, with anxiety: Secondary | ICD-10-CM | POA: Diagnosis present

## 2021-10-27 DIAGNOSIS — E860 Dehydration: Secondary | ICD-10-CM | POA: Diagnosis not present

## 2021-10-27 DIAGNOSIS — I700 Atherosclerosis of aorta: Secondary | ICD-10-CM | POA: Diagnosis present

## 2021-10-27 DIAGNOSIS — E876 Hypokalemia: Secondary | ICD-10-CM | POA: Diagnosis not present

## 2021-10-27 DIAGNOSIS — I6782 Cerebral ischemia: Secondary | ICD-10-CM | POA: Diagnosis not present

## 2021-10-27 DIAGNOSIS — Z79899 Other long term (current) drug therapy: Secondary | ICD-10-CM

## 2021-10-27 DIAGNOSIS — R0902 Hypoxemia: Secondary | ICD-10-CM | POA: Diagnosis not present

## 2021-10-27 DIAGNOSIS — I10 Essential (primary) hypertension: Secondary | ICD-10-CM | POA: Diagnosis not present

## 2021-10-27 DIAGNOSIS — M4802 Spinal stenosis, cervical region: Secondary | ICD-10-CM | POA: Diagnosis not present

## 2021-10-27 DIAGNOSIS — Z981 Arthrodesis status: Secondary | ICD-10-CM

## 2021-10-27 DIAGNOSIS — Z7984 Long term (current) use of oral hypoglycemic drugs: Secondary | ICD-10-CM

## 2021-10-27 DIAGNOSIS — Z881 Allergy status to other antibiotic agents status: Secondary | ICD-10-CM

## 2021-10-27 DIAGNOSIS — Z6837 Body mass index (BMI) 37.0-37.9, adult: Secondary | ICD-10-CM | POA: Diagnosis not present

## 2021-10-27 DIAGNOSIS — F0393 Unspecified dementia, unspecified severity, with mood disturbance: Secondary | ICD-10-CM | POA: Diagnosis present

## 2021-10-27 DIAGNOSIS — N179 Acute kidney failure, unspecified: Secondary | ICD-10-CM | POA: Diagnosis present

## 2021-10-27 DIAGNOSIS — H409 Unspecified glaucoma: Secondary | ICD-10-CM | POA: Diagnosis present

## 2021-10-27 DIAGNOSIS — F0392 Unspecified dementia, unspecified severity, with psychotic disturbance: Secondary | ICD-10-CM | POA: Diagnosis present

## 2021-10-27 DIAGNOSIS — E7800 Pure hypercholesterolemia, unspecified: Secondary | ICD-10-CM | POA: Diagnosis present

## 2021-10-27 DIAGNOSIS — Z803 Family history of malignant neoplasm of breast: Secondary | ICD-10-CM

## 2021-10-27 DIAGNOSIS — E785 Hyperlipidemia, unspecified: Secondary | ICD-10-CM | POA: Diagnosis not present

## 2021-10-27 DIAGNOSIS — Z885 Allergy status to narcotic agent status: Secondary | ICD-10-CM

## 2021-10-27 DIAGNOSIS — F0390 Unspecified dementia without behavioral disturbance: Secondary | ICD-10-CM

## 2021-10-27 DIAGNOSIS — M4856XA Collapsed vertebra, not elsewhere classified, lumbar region, initial encounter for fracture: Secondary | ICD-10-CM | POA: Diagnosis present

## 2021-10-27 DIAGNOSIS — Z888 Allergy status to other drugs, medicaments and biological substances status: Secondary | ICD-10-CM

## 2021-10-27 DIAGNOSIS — N390 Urinary tract infection, site not specified: Secondary | ICD-10-CM | POA: Diagnosis not present

## 2021-10-27 DIAGNOSIS — R4182 Altered mental status, unspecified: Secondary | ICD-10-CM | POA: Diagnosis present

## 2021-10-27 DIAGNOSIS — Z043 Encounter for examination and observation following other accident: Secondary | ICD-10-CM | POA: Diagnosis not present

## 2021-10-27 DIAGNOSIS — M25552 Pain in left hip: Secondary | ICD-10-CM | POA: Diagnosis not present

## 2021-10-27 LAB — CBC WITH DIFFERENTIAL/PLATELET
Abs Immature Granulocytes: 0.14 10*3/uL — ABNORMAL HIGH (ref 0.00–0.07)
Basophils Absolute: 0.1 10*3/uL (ref 0.0–0.1)
Basophils Relative: 0 %
Eosinophils Absolute: 0.3 10*3/uL (ref 0.0–0.5)
Eosinophils Relative: 2 %
HCT: 41.1 % (ref 36.0–46.0)
Hemoglobin: 14 g/dL (ref 12.0–15.0)
Immature Granulocytes: 1 %
Lymphocytes Relative: 17 %
Lymphs Abs: 2.4 10*3/uL (ref 0.7–4.0)
MCH: 29.8 pg (ref 26.0–34.0)
MCHC: 34.1 g/dL (ref 30.0–36.0)
MCV: 87.4 fL (ref 80.0–100.0)
Monocytes Absolute: 1.2 10*3/uL — ABNORMAL HIGH (ref 0.1–1.0)
Monocytes Relative: 8 %
Neutro Abs: 10.3 10*3/uL — ABNORMAL HIGH (ref 1.7–7.7)
Neutrophils Relative %: 72 %
Platelets: 252 10*3/uL (ref 150–400)
RBC: 4.7 MIL/uL (ref 3.87–5.11)
RDW: 13.2 % (ref 11.5–15.5)
WBC: 14.3 10*3/uL — ABNORMAL HIGH (ref 4.0–10.5)
nRBC: 0 % (ref 0.0–0.2)

## 2021-10-27 LAB — BASIC METABOLIC PANEL
Anion gap: 10 (ref 5–15)
BUN: 36 mg/dL — ABNORMAL HIGH (ref 8–23)
CO2: 26 mmol/L (ref 22–32)
Calcium: 9.1 mg/dL (ref 8.9–10.3)
Chloride: 102 mmol/L (ref 98–111)
Creatinine, Ser: 2.66 mg/dL — ABNORMAL HIGH (ref 0.44–1.00)
GFR, Estimated: 18 mL/min — ABNORMAL LOW (ref >60)
Glucose, Bld: 158 mg/dL — ABNORMAL HIGH (ref 70–99)
Potassium: 3.5 mmol/L (ref 3.5–5.1)
Sodium: 138 mmol/L (ref 135–145)

## 2021-10-27 MED ORDER — SODIUM CHLORIDE 0.9 % IV SOLN: INTRAVENOUS | Status: DC

## 2021-10-27 MED ORDER — SODIUM CHLORIDE 0.9 % IV BOLUS
1000.0000 mL | Freq: Once | INTRAVENOUS | Status: AC
  Administered 2021-10-27: 1000 mL via INTRAVENOUS

## 2021-10-27 MED ORDER — HEPARIN SODIUM (PORCINE) 5000 UNIT/ML IJ SOLN
5000.0000 [IU] | Freq: Three times a day (TID) | INTRAMUSCULAR | Status: DC
  Administered 2021-10-28 – 2021-10-29 (×5): 5000 [IU] via SUBCUTANEOUS
  Filled 2021-10-27 (×5): qty 1

## 2021-10-27 MED ORDER — ACETAMINOPHEN 650 MG RE SUPP
650.0000 mg | Freq: Four times a day (QID) | RECTAL | Status: DC | PRN
  Filled 2021-10-27: qty 1

## 2021-10-27 MED ORDER — ACETAMINOPHEN 325 MG PO TABS
650.0000 mg | ORAL_TABLET | Freq: Four times a day (QID) | ORAL | Status: DC | PRN
  Administered 2021-10-28 – 2021-10-31 (×9): 650 mg via ORAL
  Filled 2021-10-27 (×9): qty 2

## 2021-10-27 MED ORDER — ONDANSETRON HCL 4 MG PO TABS: 4.0000 mg | ORAL_TABLET | Freq: Four times a day (QID) | ORAL | Status: DC | PRN

## 2021-10-27 MED ORDER — SENNOSIDES-DOCUSATE SODIUM 8.6-50 MG PO TABS
1.0000 | ORAL_TABLET | Freq: Every evening | ORAL | Status: DC | PRN
  Administered 2021-10-31: 1 via ORAL
  Filled 2021-10-27: qty 1

## 2021-10-27 MED ORDER — ONDANSETRON HCL 4 MG/2ML IJ SOLN
4.0000 mg | Freq: Four times a day (QID) | INTRAMUSCULAR | Status: DC | PRN
  Administered 2021-10-31 (×2): 4 mg via INTRAVENOUS
  Filled 2021-10-27 (×2): qty 2

## 2021-10-27 NOTE — ED Notes (Signed)
Pt given a sandwich with crackers. Pt sitting up on side of bed. She is tolerating this well

## 2021-10-27 NOTE — ED Notes (Signed)
Informed Dr. Rogene Houston of pts BP. He ordered IV fluids. Will continue to monitor

## 2021-10-27 NOTE — ED Triage Notes (Signed)
Pt BIBA from home. Pt has fallen 2x today.Slipped in bathtub, and tripped on step. Pt hit head, no LOC, no blood thinners.   C/o px in neck, lower back, and L hip. Hip tender to touch.  Given 50 mcg fent   20 L AC  Pt recently diagnosed w/ a form of dementia.   Aox4, speech slurred  BP: 114/72 HR: 70 Spo2: 98 RA

## 2021-10-27 NOTE — ED Provider Notes (Signed)
[facility] DEPT Provider Note   CSN: [id number] Arrival date & time: 10/27/21  1656     History  Chief Complaint  Patient presents with   Fall   Altered Mental Status    Danielle [name] is a 76 y.o. female.  Patient brought in by EMS.  Patient had 2 falls today 1 was at home getting out of the bathtub when she was cleaning it.  Said she tripped.  And the second 1 was at her son's house where she tripped over the step down into a sunken either living room or family room.  On the first fall she fell forward.  No loss of consciousness.  On the second fall she fell backwards.  Hit hit her head on that 1 no loss of conscious patient not on blood thinners.  Here with complaints of neck pain came in with cervical collar in place complaint of lower back pain left hip pain left-sided abdominal pain left-sided chest pain.  EMS gave her 50 mcg of fentanyl.  Patient recently diagnosed with dementia.  Blood pressure for EMS was 114/72 oxygen saturations on room air were 98%.  Past medical history significant for the dementia hypertension high cholesterol mitral regurg coronary artery disease diabetes.  Past surgical history significant for abdominal hysterectomy cholecystectomy appendectomy total hip arthroplasty in 2013 done by Dr. Alvan Dame.  Patient never used tobacco products.  Patient also relates that she has not been eating or drinking very well but she has this history of dementia so it is a little hard to tell what this is.       Home Medications Prior to Admission medications   Medication Sig Start Date End Date Taking? Authorizing Provider  BIOTIN PO Take 5,000 mcg by mouth.    [provider]  CALCIUM-VITAMIN D PO Take 1 capsule by mouth daily. 1200-1000 calcium-vitamin d    [provider]  fluticasone (FLONASE) 50 MCG/ACT nasal spray as needed.    [provider]  hydrochlorothiazide (HYDRODIURIL) 25 MG tablet Take 25 mg by mouth  daily.    [provider]  hydrOXYzine (ATARAX) 25 MG tablet Take 1 tablet (25 mg total) by mouth every 6 (six) hours as needed for anxiety. 07/17/21   Gareth Morgan, MD  hydrOXYzine (ATARAX) 25 MG tablet Take 1 tablet (25 mg total) by mouth 3 (three) times daily as needed. 09/30/21   Tegeler, Gwenyth Allegra, MD  levothyroxine (SYNTHROID, LEVOTHROID) 137 MCG tablet Take 137 mcg by mouth daily before breakfast.    [provider]  LORazepam (ATIVAN) 1 MG tablet Take 1 mg by mouth 2 (two) times daily. 09/17/21   [provider]  losartan (COZAAR) 50 MG tablet Take 50 mg by mouth daily.    [provider]  Magnesium 100 MG CAPS Take 100 mg by mouth daily.    [provider]  metFORMIN (GLUCOPHAGE-XR) 500 MG 24 hr tablet Take 500 mg by mouth daily with breakfast.    [provider]  metoprolol tartrate (LOPRESSOR) 25 MG tablet TAKE 1/2 TO 1 TABLET IN THE MORNING AND AT BEDTIME FOR PALPITATIONS. NEED APPOINTMENT FOR FURTHER REFILLS 03/03/21   Skeet Latch, MD  OLANZapine [facility]) 2.5 MG tablet Take one tablet 2.5 mg for 7 days, then take two tablets 5 mg at bedtime daily. 10/09/21   Elwanda Brooklyn, NP  ondansetron (ZOFRAN) 4 MG tablet Take 4 mg by mouth every 8 (eight) hours as needed. 09/23/21   [provider]  prednisoLONE acetate (PRED FORTE) 1 % ophthalmic suspension prednisolone acetate 1 % eye drops,suspension    [provider]  rosuvastatin (CRESTOR) 40 MG tablet Take 1 tablet (40 mg total) by mouth daily. 08/18/17   Skeet Latch, MD  rosuvastatin (CRESTOR) 40 MG tablet Take 1 tablet by mouth daily.    [provider]  sertraline (ZOLOFT) 50 MG tablet Take 1 tablet (50 mg total) by mouth daily. Take 1/2 tablet 25 mg for 7 days, then take one whole tablet 50 mg total daily after breakfast 10/09/21   Elwanda Brooklyn, NP  Vitamin D, Ergocalciferol, (DRISDOL) 1.25 MG (50000 UNIT) CAPS capsule Take 50,000 Units by mouth  once a week. 07/01/21   [provider]      Allergies    Ceftin [cefuroxime axetil], Codeine, and Compazine [prochlorperazine]    Review of Systems   Review of Systems  Constitutional:  Negative for chills and fever.  HENT:  Negative for ear pain and sore throat.   Eyes:  Negative for pain and visual disturbance.  Respiratory:  Negative for cough and shortness of breath.   Cardiovascular:  Positive for chest pain. Negative for palpitations.  Gastrointestinal:  Positive for abdominal pain. Negative for vomiting.  Genitourinary:  Negative for dysuria and hematuria.  Musculoskeletal:  Positive for back pain and neck pain. Negative for arthralgias.  Skin:  Negative for color change and rash.  Neurological:  Negative for seizures and syncope.  Psychiatric/Behavioral:  Positive for confusion.   All other systems reviewed and are negative.   Physical Exam Updated Vital Signs BP (!) 93/40   Pulse 71   Temp 97.6 F (36.4 C) (Oral)   Resp 18   Ht 1.575 m ('5\' 2"'$ )   Wt 93.3 kg   SpO2 96%   BMI 37.62 kg/m  Physical Exam Vitals and nursing note reviewed.  Constitutional:      General: She is not in acute distress.    Appearance: Normal appearance. She is well-developed.  HENT:     Head: Normocephalic and atraumatic.     Mouth/Throat:     Mouth: Mucous membranes are dry.  Eyes:     Extraocular Movements: Extraocular movements intact.     Conjunctiva/sclera: Conjunctivae normal.     Pupils: Pupils are equal, round, and reactive to light.  Neck:     Comments: Cervical collar in place. Cardiovascular:     Rate and Rhythm: Normal rate and regular rhythm.     Heart sounds: No murmur heard. Pulmonary:     Effort: Pulmonary effort is normal. No respiratory distress.     Breath sounds: Normal breath sounds.     Comments: Tenderness palpation left side of chest. Chest:     Chest wall: Tenderness present.  Abdominal:     General: Bowel sounds are normal.     Palpations:  Abdomen is soft.     Tenderness: There is abdominal tenderness.     Comments: Tenderness to palpation left side of the abdomen.  Musculoskeletal:        General: Tenderness present. No swelling, deformity or signs of injury.     Comments: Tenderness to palpation mid to upper lumbar area.  And no significant tenderness to palpation to left hip area.  Skin:    General: Skin is warm and dry.     Capillary Refill: Capillary refill takes less than 2 seconds.  Neurological:     General: No focal deficit present.     Mental  Status: She is alert and oriented to person, place, and time.     Cranial Nerves: No cranial nerve deficit.     Sensory: No sensory deficit.     Motor: No weakness.  Psychiatric:        Mood and Affect: Mood normal.     ED Results / Procedures / Treatments   Labs (all labs ordered are listed, but only abnormal results are displayed) Labs Reviewed  CBC WITH DIFFERENTIAL/PLATELET - Abnormal; Notable for the following components:      Result Value   WBC 14.3 (*)    Neutro Abs 10.3 (*)    Monocytes Absolute 1.2 (*)    Abs Immature Granulocytes 0.14 (*)    All other components within normal limits  BASIC METABOLIC PANEL - Abnormal; Notable for the following components:   Glucose, Bld 158 (*)    BUN 36 (*)    Creatinine, Ser 2.66 (*)    GFR, Estimated 18 (*)    All other components within normal limits  CULTURE, BLOOD (ROUTINE X 2)  CULTURE, BLOOD (ROUTINE X 2)  URINALYSIS, ROUTINE W REFLEX MICROSCOPIC  CK  BASIC METABOLIC PANEL  CBC  PROCALCITONIN  CORTISOL    EKG None  Radiology CT CHEST ABDOMEN PELVIS WO CONTRAST  Result Date: 10/27/2021 CLINICAL DATA:  Two falls today with pain in lower back and left hip EXAM: CT CHEST, ABDOMEN AND PELVIS WITHOUT CONTRAST TECHNIQUE: Multidetector CT imaging of the chest, abdomen and pelvis was performed following the standard protocol without IV contrast. RADIATION DOSE REDUCTION: This exam was performed according to  the departmental dose-optimization program which includes automated exposure control, adjustment of the mA and/or kV according to patient size and/or use of iterative reconstruction technique. COMPARISON:  CT abdomen and pelvis 09/27/2021 FINDINGS: CT CHEST FINDINGS Cardiovascular: Borderline cardiomegaly. Coronary artery atherosclerotic calcification. Aortic valve and mitral annular calcification. Mediastinum/Nodes: No enlarged mediastinal, hilar, or axillary lymph nodes. Thyroid gland, trachea, and esophagus demonstrate no significant findings. Lungs/Pleura: Patchy areas of scarring/atelectasis. No focal consolidation, pleural effusion, or pneumothorax. Musculoskeletal: No rib fractures. CT ABDOMEN PELVIS FINDINGS Hepatobiliary: No focal liver abnormality is seen. Status post cholecystectomy. No biliary dilatation. Pancreas: Unremarkable. No pancreatic ductal dilatation or surrounding inflammatory changes. Spleen: Normal in size without focal abnormality. Adrenals/Urinary Tract: Adrenal glands are unremarkable. Kidneys are normal, without renal calculi, suspicious focal lesion, or hydronephrosis. Bladder and inferior pelvis are poorly evaluated due to streak artifact from bilateral THA. Stomach/Bowel: Stomach is within normal limits. The appendix is not visualized. No evidence of bowel wall thickening, distention, or inflammatory changes. Vascular/Lymphatic: Aortic atherosclerosis. No enlarged abdominal or pelvic lymph nodes. Reproductive: Status post hysterectomy. Poorly evaluated due to streak artifact. Other: No free intraperitoneal fluid or air. Musculoskeletal: Acute compression fracture of the superior endplate of L3 with a proximally 2-3 mm of compression. No retropulsion. Otherwise no acute osseous abnormality. Thoracolumbar spondylosis. Chronic left-sided L5 pars defects. Grade 1 anterolisthesis L4 on L5. No definite pelvic fracture. Bilateral THA. IMPRESSION: Acute compression fracture of the superior  endplate of L3 with 2-3 mm of height loss. No retropulsion. Otherwise no acute traumatic abnormality in the chest, abdomen, or pelvis. Coronary artery and Aortic Atherosclerosis (ICD10-I70.0). Electronically Signed   By: Placido Sou M.D.   On: 10/27/2021 21:28   CT Cervical Spine Wo Contrast  Result Date: 10/27/2021 CLINICAL DATA:  Trauma.  Fall. EXAM: CT CERVICAL SPINE WITHOUT CONTRAST TECHNIQUE: Multidetector CT imaging of the cervical spine was performed without intravenous contrast. Multiplanar  CT image reconstructions were also generated. RADIATION DOSE REDUCTION: This exam was performed according to the departmental dose-optimization program which includes automated exposure control, adjustment of the mA and/or kV according to patient size and/or use of iterative reconstruction technique. COMPARISON:  None Available. FINDINGS: Alignment: Normal. Skull base and vertebrae: The bones are osteopenic. There is no acute fracture. No focal osseous lesion. C5, C6, C7 anterior fusion plate is present without complication. Soft tissues and spinal canal: No prevertebral fluid or swelling. No visible canal hematoma. Disc levels: There is disc space narrowing at C2-C3, C3-C4, C4-C5 C7 T1 compatible with degenerative change. Moderate bilateral neural foraminal stenosis at C3-C4 and mild bilateral neural foraminal stenosis at C4-C5 secondary to uncovertebral spurring. No severe central canal stenosis identified at any level. Upper chest: Negative. Other: Small air-fluid levels in the bilateral maxillary sinuses. IMPRESSION: 1. No acute fracture or traumatic subluxation of the cervical spine. 2. C5-C7 anterior fusion plate without complication. 3. Small air-fluid levels in the bilateral maxillary sinuses. Correlate for acute sinusitis. Electronically Signed   By: Ronney Asters M.D.   On: 10/27/2021 21:26   CT Head Wo Contrast  Result Date: 10/27/2021 CLINICAL DATA:  Head trauma EXAM: CT HEAD WITHOUT CONTRAST  TECHNIQUE: Contiguous axial images were obtained from the base of the skull through the vertex without intravenous contrast. RADIATION DOSE REDUCTION: This exam was performed according to the departmental dose-optimization program which includes automated exposure control, adjustment of the mA and/or kV according to patient size and/or use of iterative reconstruction technique. COMPARISON:  CT brain 07/16/2021 FINDINGS: Brain: No acute territorial infarction, hemorrhage or intracranial mass. Mild chronic small vessel ischemic changes of the white matter. The ventricles are nonenlarged Vascular: No hyperdense vessel or unexpected calcification. Skull: Normal. Negative for fracture or focal lesion. Sinuses/Orbits: No acute finding. Other: None IMPRESSION: 1. No CT evidence for acute intracranial abnormality. 2. Mild chronic small vessel ischemic changes of the white matter Electronically Signed   By: Donavan Foil M.D.   On: 10/27/2021 21:18   DG Chest Port 1 View  Result Date: 10/27/2021 CLINICAL DATA:  Fall EXAM: PORTABLE CHEST 1 VIEW COMPARISON:  12/29/2017 FINDINGS: Cardiomegaly with pulmonary vascular congestion. Streaky left perihilar opacity. No large pleural fluid collection. No pneumothorax. IMPRESSION: 1. Cardiomegaly with pulmonary vascular congestion. 2. Streaky left perihilar opacity may represent atelectasis or pneumonia. Electronically Signed   By: Davina Poke D.O.   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 Arthroplasty components are in their expected alignment. No periprosthetic lucency or fracture. Pelvic bony ring intact. Partially imaged right hip arthroplasty appears intact. No appreciable soft tissue abnormality. IMPRESSION: No acute osseous abnormality. Postsurgical  changes from left total hip arthroplasty without evidence of complication. Electronically Signed   By: Davina Poke D.O.   On: 10/27/2021 18:07    Procedures Procedures    Medications Ordered in ED Medications  sodium chloride 0.9 % bolus 1,000 mL (has no administration in time range)    Followed by  0.9 %  sodium chloride infusion (has no administration in time range)  heparin injection 5,000 Units (has no administration in time range)  acetaminophen (TYLENOL) tablet 650 mg (has no administration in time range)    Or  acetaminophen (TYLENOL) suppository 650 mg (has no administration in  time range)  ondansetron (ZOFRAN) tablet 4 mg (has no administration in time range)    Or  ondansetron (ZOFRAN) injection 4 mg (has no administration in time range)  senna-docusate (Senokot-S) tablet 1 tablet (has no administration in time range)  sodium chloride 0.9 % bolus 1,000 mL (0 mLs Intravenous Stopped 10/27/21 2128)    ED Course/ Medical Decision Making/ A&P                           Medical Decision Making Amount and/or Complexity of Data Reviewed Labs: ordered. Radiology: ordered.  Risk Decision regarding hospitalization.   CRITICAL CARE Performed by: Fredia Sorrow Total critical care time: 45 minutes Critical care time was exclusive of separately billable procedures and treating other patients. Critical care was necessary to treat or prevent imminent or life-threatening deterioration. Critical care was time spent personally by me on the following activities: development of treatment plan with patient and/or surrogate as well as nursing, discussions with consultants, evaluation of patient's response to treatment, examination of patient, obtaining history from patient or surrogate, ordering and performing treatments and interventions, ordering and review of laboratory studies, ordering and review of radiographic studies, pulse oximetry and re-evaluation of patient's  condition.  Initial concern was for the fall.  Patient had tenderness to left-sided chest left side of abdomen left hip area without any deformity.  Patient initially had x-ray of pelvis and left hip without any bony abnormalities.  CT head neck chest abdomen and pelvis with only the finding of L3 compression fracture without a lot of reduced height.  Discussed with Dr. Shon Hale good on for neurosurgery.  And felt that there would be no need for that.  As time went on patient seemed quite comfortable even comfortable sitting.  Patient CBC white count 14,000 hemoglobin 14 basic metabolic panel BUN 36 creatinine 2.66 for a GFR of 18 this is significant change in patient's renal function.  Electrolytes are normal.  Patient arrived with a systolic blood pressure 476 but then she got a drop down into the 80s.  Mouth was dry said she had not been eating or drinking very much and also look like based on her labs that we had at the time she was in acute kidney injury.  Patient given a liter of fluid and blood pressures came back up to 546 systolics.  Did drift down a bit later in her stay she also had maintenance fluids.  Did drift down back into the 90s.  Patient was again received another fluid bolus.  Feel that this is a prerenal situation.  And patient CAT scan showed no evidence of any significant injury from the fall other than the L3 compression fracture which was fairly minimal.  No evidence of any pelvic fracture or hip fracture internal abdominal injuries or any internal chest injuries.  And CT cervical spine was negative CT head without any acute findings.  Hospitalist contacted patient will will need admission for the acute kidney injury.  Feel that the hypotensive episode also was hypovolemia no evidence of any significant blood loss.    Neurosurgery felt no immediate need for intervention or treatment for the compression fracture.  Final Clinical Impression(s) / ED Diagnoses Final diagnoses:   Fall, initial encounter  AKI (acute kidney injury) ([place])  Compression fracture of L3 vertebra, initial encounter ([place])  Hypotension due to hypovolemia  Dementia, unspecified dementia severity, unspecified dementia type, unspecified whether behavioral, psychotic, or mood disturbance or anxiety (  [facility])    Rx / DC Orders ED Discharge Orders     None         Fredia Sorrow, MD 10/27/21 2334

## 2021-10-27 NOTE — H&P (Signed)
History and Physical    Danielle [name] VHQ:[id number] DOB: Apr 15, 1945 DOA: 10/27/2021  PCP: [name], MD  Patient coming from: Home via EMS  I have personally briefly reviewed patient's old medical records in [place]  Chief Complaint: Falls at home  HPI: Danielle [name] is a 76 y.o. female with medical history significant for T2DM, HTN, HLD, hypothyroidism, depression/anxiety, OSA on CPAP, memory loss who presented to the ED for evaluation after falls at home.  Patient reports 2 falls prior to arrival today.  She says the first fall was while she was cleaning her bathtub she slipped and fell face first in the bathtub.  She did not lose consciousness.  She was unable to get out of tub herself until her husband was able to help her out about 45 minutes later.  She says she had another fall when she was in her son's house.  They have a step down in the living room where she tripped and fell backwards landing on her back.  She says she felt nauseous afterwards but again did not lose consciousness.  She has had some lightheadedness and dizziness recently.  She says that she has had low appetite and poor oral intake.  She reports increased urinary frequency with some suprapubic discomfort but no dysuria.  She has had loose stools these last couple days.  ED Course  Labs/Imaging on admission: I have personally reviewed following labs and imaging studies.  Initial vitals showed BP 113/40, pulse 72, RR 12, temp 97.3 F, SPO2 94% on room air.  While in the ED patient became hypotensive with BP as low as 78/43, improved to 114/56 with IV fluids.  Labs show BUN 36, creatinine 2.66 (baseline creatinine 0.9), serum glucose 158, sodium 138, potassium 3.5, bicarb 26, WBC 14.3, hemoglobin 14.0, platelets 252,000.  Pelvic x-ray negative for acute osseous abnormality.  Prior left THA changes seen without complication.  Portable chest x-ray shows cardiomegaly with pulmonary  vascular congestion.  CT head without contrast negative for acute endocrine abnormality.  CT cervical spine without contrast negative for acute fracture or traumatic subluxation of the C-spine.  CT chest/abdomen/pelvis without contrast shows an acute compression fracture of the superior endplate of L3 with 2-3 mm of height loss.  No retropulsion.  Otherwise no acute abnormality in the chest, abdomen, pelvis.  Patient was given 1 L normal saline.  EDP discussed with on-call neurosurgery who felt L2 compression fracture with minimal and recommended conservative management with pain control.  The hospitalist service was consulted to admit for further evaluation and management.  Review of Systems: All systems reviewed and are negative except as documented in history of present illness above.   Past Medical History:  Diagnosis Date   Arthritis 02/13/2011   osteoarthritis, hips, knees,s/p Cervical fusion(DDD)   Atherosclerosis of aorta ([place]) 05/30/2015   on CTA chest   CAD (coronary artery disease)    a. 09/2016: Coronary CT showing mid-LAD plaque with 20-50% associated stenosis but no significant stenosis by FFR analysis.    Dementia ([place])    Depression with anxiety    Diabetes mellitus without complication ([place])    Difficult intubation 02/13/2011   with surgery -multiple times, no problems with recent surgeries   GERD (gastroesophageal reflux disease) 02/13/2011   tx. Omeprazole   Glaucoma 02/13/2011   bil. tx. eye drops daily   Headache(784.0) 02/13/2011   past hx. migraines, none recent   Heart murmur 02/13/2011   was told-no issues with this  Hypercholesterolemia    Hypertension 02/13/2011   Hypothyroidism 02/13/2011   tx. with Levothyroxine   Mitral regurgitation 06/01/2015   Mild   Sinus drainage 02/13/2011   uses Zyrtec as needed   Sleep apnea 02/13/2011   Hx. sleep apnea-uses cpap since 10'12 nightly    Past Surgical History:  Procedure Laterality Date   ABDOMINAL  HYSTERECTOMY  02/13/2011   APPENDECTOMY  02/13/2011   Lap. removal '92   BREAST CYST EXCISION Left    CERVICAL FUSION  02/13/2011   '92- retained hardware   CHOLECYSTECTOMY  02/13/2011   '98-lap. galbladder removal due to stones   GUM SURGERY  02/13/2011   "small mouth"   JOINT REPLACEMENT  02/13/2011   RTHA-a yr ago in [place] Left 08/15/2019   Procedure: EXCISION OF SEBACEOUS CYST LEFT BREAST;  Surgeon: Georganna Skeans, MD;  Location: [facility];  Service: General;  Laterality: Left;   TONSILLECTOMY  02/13/2011   child   TOTAL HIP ARTHROPLASTY  02/17/2011   Procedure: TOTAL HIP ARTHROPLASTY ANTERIOR APPROACH;  Surgeon: Mauri Pole, MD;  Location: [facility];  Service: Orthopedics;  Laterality: Left;   TUBAL LIGATION  02/13/2011    Social History:  reports that she has never smoked. She has never used smokeless tobacco. She reports current alcohol use. She reports that she does not use drugs.  Allergies  Allergen Reactions   Ceftin [Cefuroxime Axetil] Hives and Swelling    Swelling of face   Codeine Nausea Only   Compazine [Prochlorperazine] Swelling and Other (See Comments)    Face and tongue swelling    Family History  Problem Relation Age of Onset   Cancer Mother        lung   Breast cancer Maternal Aunt    Alzheimer's disease Neg Hx    Dementia Neg Hx      Prior to Admission medications   Medication Sig Start Date End Date Taking? Authorizing Provider  BIOTIN PO Take 5,000 mcg by mouth.    [provider]  CALCIUM-VITAMIN D PO Take 1 capsule by mouth daily. 1200-1000 calcium-vitamin d    [provider]  fluticasone (FLONASE) 50 MCG/ACT nasal spray as needed.    [provider]  hydrochlorothiazide (HYDRODIURIL) 25 MG tablet Take 25 mg by mouth daily.    [provider]  hydrOXYzine (ATARAX) 25 MG tablet Take 1 tablet (25 mg total) by mouth every 6 (six) hours as needed for anxiety. 07/17/21    Gareth Morgan, MD  hydrOXYzine (ATARAX) 25 MG tablet Take 1 tablet (25 mg total) by mouth 3 (three) times daily as needed. 09/30/21   Tegeler, Gwenyth Allegra, MD  levothyroxine (SYNTHROID, LEVOTHROID) 137 MCG tablet Take 137 mcg by mouth daily before breakfast.    [provider]  LORazepam (ATIVAN) 1 MG tablet Take 1 mg by mouth 2 (two) times daily. 09/17/21   [provider]  losartan (COZAAR) 50 MG tablet Take 50 mg by mouth daily.    [provider]  Magnesium 100 MG CAPS Take 100 mg by mouth daily.    [provider]  metFORMIN (GLUCOPHAGE-XR) 500 MG 24 hr tablet Take 500 mg by mouth daily with breakfast.    [provider]  metoprolol tartrate (LOPRESSOR) 25 MG tablet TAKE 1/2 TO 1 TABLET IN THE MORNING AND AT BEDTIME FOR PALPITATIONS. NEED APPOINTMENT FOR FURTHER REFILLS 03/03/21   Skeet Latch, MD  OLANZapine [facility]) 2.5 MG tablet Take one tablet  2.5 mg for 7 days, then take two tablets 5 mg at bedtime daily. 10/09/21   Elwanda Brooklyn, NP  ondansetron (ZOFRAN) 4 MG tablet Take 4 mg by mouth every 8 (eight) hours as needed. 09/23/21   [provider]  prednisoLONE acetate (PRED FORTE) 1 % ophthalmic suspension prednisolone acetate 1 % eye drops,suspension    [provider]  rosuvastatin (CRESTOR) 40 MG tablet Take 1 tablet (40 mg total) by mouth daily. 08/18/17   Skeet Latch, MD  rosuvastatin (CRESTOR) 40 MG tablet Take 1 tablet by mouth daily.    [provider]  sertraline (ZOLOFT) 50 MG tablet Take 1 tablet (50 mg total) by mouth daily. Take 1/2 tablet 25 mg for 7 days, then take one whole tablet 50 mg total daily after breakfast 10/09/21   Elwanda Brooklyn, NP  Vitamin D, Ergocalciferol, (DRISDOL) 1.25 MG (50000 UNIT) CAPS capsule Take 50,000 Units by mouth once a week. 07/01/21   [provider]    Physical Exam: Vitals:   10/27/21 2153 10/27/21 2234 10/27/21 2330 10/27/21 2349  BP:  (!) 93/40 (!)  102/55 113/61  Pulse:  71 68 72  Resp:   15 19  Temp: 97.6 F (36.4 C)     TempSrc: Oral     SpO2:  96% 97% 98%  Weight:      Height:       Constitutional: Sitting up on the side of the bed, NAD, calm, comfortable Eyes: EOMI, lids and conjunctivae normal ENMT: Mucous membranes are dry. Posterior pharynx clear of any exudate or lesions.Normal dentition.  Neck: normal, supple, no masses. Respiratory: clear to auscultation bilaterally, no wheezing, no crackles. Normal respiratory effort. No accessory muscle use.  Cardiovascular: Regular rate and rhythm, no murmurs / rubs / gallops. No extremity edema.  Abdomen: no tenderness, no masses palpated.  Musculoskeletal: no clubbing / cyanosis. No joint deformity upper and lower extremities. Good ROM, no contractures. Normal muscle tone.  Skin: no rashes, lesions, ulcers. No induration Neurologic: Sensation intact. Strength 5/5 in all 4.  Psychiatric: Alert and oriented x 3.  EKG: Personally reviewed. Sinus rhythm, LAE, RBBB.  RBBB is new when compared to prior.  Assessment/Plan Principal Problem:   Hypotension Active Problems:   AKI (acute kidney injury) ([place])   Closed compression fracture of L3 lumbar vertebra, initial encounter (HCC)   OSA (obstructive sleep apnea)   Hyperlipidemia LDL goal <70   Hypothyroidism   Depression with anxiety   Danielle [name] is a 76 y.o. female with medical history significant for T2DM, HTN, HLD, hypothyroidism, depression/anxiety, OSA on CPAP, memory loss who is admitted with hypovolemic hypotension with AKI and acute L3 compression fracture.  Assessment and Plan: * Hypotension Likely hypovolemic hypotension related to poor oral intake and recent loose stools plus medication effect.  Has leukocytosis but otherwise no features of active infectious process. -Give additional 1 L NS bolus followed by maintenance fluids overnight -Check urinalysis and blood cultures -Hold home antihypertensives  AKI  (acute kidney injury) ([place]) Creatinine 2.66 on admission compared to baseline around 0.9.  Suspect prerenal from hypovolemia.  CT A/P with normal-appearing kidneys, no renal calculi, focal lesion, or hydronephrosis. -Continue IV fluid hydration as above -Monitor UOP, bladder scan and in and out cath as needed -Hold losartan, HCTZ, and metformin  Closed compression fracture of L3 lumbar vertebra, initial encounter (HCC) Acute compression fracture of the superior endplate of L3 with 2-3 mm height loss, no retropulsion seen on CT.  Suspect current due to one of her falls prior to arrival.  EDP spoke with on-call neurosurgery who felt this was mild and recommended conservative management.  Not having much pain on admission. -PT/OT eval -Tylenol as needed, avoiding narcotics for now due to hypotension and NSAIDs due to AKI  Depression with anxiety She has had some recent adjustments in her medications by behavioral health, awaiting medication reconciliation to resume.  Hypothyroidism Continue Synthroid.  Hyperlipidemia LDL goal <70 Continue rosuvastatin.  OSA (obstructive sleep apnea) Continue CPAP nightly.  DVT prophylaxis: heparin injection 5,000 Units Start: 10/28/21 0600 Code Status: Full code Family Communication: Discussed with patient, she has discussed with family Disposition Plan: From home, dispo pending clinical progress Consults called: None Severity of Illness: The appropriate patient status for this patient is OBSERVATION. Observation status is judged to be reasonable and necessary in order to provide the required intensity of service to ensure the patient's safety. The patient's presenting symptoms, physical exam findings, and initial radiographic and laboratory data in the context of their medical condition is felt to place them at decreased risk for further clinical deterioration. Furthermore, it is anticipated that the patient will be medically stable for discharge from the  hospital within 2 midnights of admission.   Zada Finders MD [facility]  If 7PM-7AM, please contact night-coverage [email or web address]  10/28/2021, 12:11 AM

## 2021-10-28 DIAGNOSIS — E7800 Pure hypercholesterolemia, unspecified: Secondary | ICD-10-CM | POA: Diagnosis present

## 2021-10-28 DIAGNOSIS — N390 Urinary tract infection, site not specified: Secondary | ICD-10-CM

## 2021-10-28 DIAGNOSIS — F418 Other specified anxiety disorders: Secondary | ICD-10-CM

## 2021-10-28 DIAGNOSIS — G4733 Obstructive sleep apnea (adult) (pediatric): Secondary | ICD-10-CM

## 2021-10-28 DIAGNOSIS — S32030A Wedge compression fracture of third lumbar vertebra, initial encounter for closed fracture: Secondary | ICD-10-CM

## 2021-10-28 DIAGNOSIS — Z6837 Body mass index (BMI) 37.0-37.9, adult: Secondary | ICD-10-CM | POA: Diagnosis not present

## 2021-10-28 DIAGNOSIS — E861 Hypovolemia: Secondary | ICD-10-CM | POA: Diagnosis present

## 2021-10-28 DIAGNOSIS — E039 Hypothyroidism, unspecified: Secondary | ICD-10-CM

## 2021-10-28 DIAGNOSIS — I2510 Atherosclerotic heart disease of native coronary artery without angina pectoris: Secondary | ICD-10-CM | POA: Diagnosis present

## 2021-10-28 DIAGNOSIS — H409 Unspecified glaucoma: Secondary | ICD-10-CM | POA: Diagnosis present

## 2021-10-28 DIAGNOSIS — R443 Hallucinations, unspecified: Secondary | ICD-10-CM | POA: Diagnosis not present

## 2021-10-28 DIAGNOSIS — M4856XA Collapsed vertebra, not elsewhere classified, lumbar region, initial encounter for fracture: Secondary | ICD-10-CM | POA: Diagnosis present

## 2021-10-28 DIAGNOSIS — F05 Delirium due to known physiological condition: Secondary | ICD-10-CM | POA: Diagnosis not present

## 2021-10-28 DIAGNOSIS — E669 Obesity, unspecified: Secondary | ICD-10-CM | POA: Diagnosis present

## 2021-10-28 DIAGNOSIS — I10 Essential (primary) hypertension: Secondary | ICD-10-CM | POA: Diagnosis present

## 2021-10-28 DIAGNOSIS — W182XXA Fall in (into) shower or empty bathtub, initial encounter: Secondary | ICD-10-CM | POA: Diagnosis present

## 2021-10-28 DIAGNOSIS — I700 Atherosclerosis of aorta: Secondary | ICD-10-CM | POA: Diagnosis present

## 2021-10-28 DIAGNOSIS — N179 Acute kidney failure, unspecified: Secondary | ICD-10-CM

## 2021-10-28 DIAGNOSIS — M1611 Unilateral primary osteoarthritis, right hip: Secondary | ICD-10-CM | POA: Diagnosis present

## 2021-10-28 DIAGNOSIS — Y92012 Bathroom of single-family (private) house as the place of occurrence of the external cause: Secondary | ICD-10-CM | POA: Diagnosis not present

## 2021-10-28 DIAGNOSIS — R4182 Altered mental status, unspecified: Secondary | ICD-10-CM | POA: Diagnosis present

## 2021-10-28 DIAGNOSIS — E785 Hyperlipidemia, unspecified: Secondary | ICD-10-CM

## 2021-10-28 DIAGNOSIS — E119 Type 2 diabetes mellitus without complications: Secondary | ICD-10-CM | POA: Diagnosis present

## 2021-10-28 DIAGNOSIS — I959 Hypotension, unspecified: Secondary | ICD-10-CM | POA: Diagnosis not present

## 2021-10-28 DIAGNOSIS — K219 Gastro-esophageal reflux disease without esophagitis: Secondary | ICD-10-CM | POA: Diagnosis present

## 2021-10-28 DIAGNOSIS — F0393 Unspecified dementia, unspecified severity, with mood disturbance: Secondary | ICD-10-CM | POA: Diagnosis present

## 2021-10-28 DIAGNOSIS — F0392 Unspecified dementia, unspecified severity, with psychotic disturbance: Secondary | ICD-10-CM | POA: Diagnosis present

## 2021-10-28 DIAGNOSIS — E876 Hypokalemia: Secondary | ICD-10-CM | POA: Diagnosis not present

## 2021-10-28 DIAGNOSIS — F32A Depression, unspecified: Secondary | ICD-10-CM | POA: Diagnosis present

## 2021-10-28 DIAGNOSIS — F0394 Unspecified dementia, unspecified severity, with anxiety: Secondary | ICD-10-CM | POA: Diagnosis present

## 2021-10-28 DIAGNOSIS — I9589 Other hypotension: Secondary | ICD-10-CM | POA: Diagnosis present

## 2021-10-28 DIAGNOSIS — I340 Nonrheumatic mitral (valve) insufficiency: Secondary | ICD-10-CM | POA: Diagnosis present

## 2021-10-28 LAB — URINALYSIS, ROUTINE W REFLEX MICROSCOPIC
Bilirubin Urine: NEGATIVE
Glucose, UA: NEGATIVE mg/dL
Hgb urine dipstick: NEGATIVE
Ketones, ur: NEGATIVE mg/dL
Nitrite: NEGATIVE
Protein, ur: 30 mg/dL — AB
Specific Gravity, Urine: 1.01 (ref 1.005–1.030)
pH: 5 (ref 5.0–8.0)

## 2021-10-28 LAB — PROCALCITONIN: Procalcitonin: <0.1 ng/mL

## 2021-10-28 LAB — SODIUM, URINE, RANDOM: Sodium, Ur: 77 mmol/L

## 2021-10-28 LAB — CBC
HCT: 39 % (ref 36.0–46.0)
Hemoglobin: 12.9 g/dL (ref 12.0–15.0)
MCH: 29.7 pg (ref 26.0–34.0)
MCHC: 33.1 g/dL (ref 30.0–36.0)
MCV: 89.9 fL (ref 80.0–100.0)
Platelets: 229 10*3/uL (ref 150–400)
RBC: 4.34 MIL/uL (ref 3.87–5.11)
RDW: 13.2 % (ref 11.5–15.5)
WBC: 9.9 10*3/uL (ref 4.0–10.5)
nRBC: 0 % (ref 0.0–0.2)

## 2021-10-28 LAB — HEMOGLOBIN A1C
Hgb A1c MFr Bld: 5.7 % — ABNORMAL HIGH (ref 4.8–5.6)
Mean Plasma Glucose: 116.89 mg/dL

## 2021-10-28 LAB — BASIC METABOLIC PANEL
Anion gap: 5 (ref 5–15)
BUN: 30 mg/dL — ABNORMAL HIGH (ref 8–23)
CO2: 25 mmol/L (ref 22–32)
Calcium: 8.5 mg/dL — ABNORMAL LOW (ref 8.9–10.3)
Chloride: 110 mmol/L (ref 98–111)
Creatinine, Ser: 2.25 mg/dL — ABNORMAL HIGH (ref 0.44–1.00)
GFR, Estimated: 22 mL/min — ABNORMAL LOW (ref >60)
Glucose, Bld: 91 mg/dL (ref 70–99)
Potassium: 3.5 mmol/L (ref 3.5–5.1)
Sodium: 140 mmol/L (ref 135–145)

## 2021-10-28 LAB — CREATININE, URINE, RANDOM: Creatinine, Urine: 84 mg/dL

## 2021-10-28 LAB — GLUCOSE, CAPILLARY
Glucose-Capillary: 98 mg/dL (ref 70–99)
Glucose-Capillary: 137 mg/dL — ABNORMAL HIGH (ref 70–99)

## 2021-10-28 LAB — CBG MONITORING, ED: Glucose-Capillary: 157 mg/dL — ABNORMAL HIGH (ref 70–99)

## 2021-10-28 LAB — CORTISOL: Cortisol, Plasma: 12.8 ug/dL

## 2021-10-28 MED ORDER — FLUTICASONE PROPIONATE 50 MCG/ACT NA SUSP
2.0000 | Freq: Every day | NASAL | Status: DC
  Administered 2021-10-29 – 2021-10-31 (×3): 2 via NASAL
  Filled 2021-10-28: qty 16

## 2021-10-28 MED ORDER — ROSUVASTATIN CALCIUM 20 MG PO TABS: 40.0000 mg | ORAL_TABLET | Freq: Every day | ORAL | Status: DC

## 2021-10-28 MED ORDER — LEVOTHYROXINE SODIUM 25 MCG PO TABS
137.0000 ug | ORAL_TABLET | Freq: Every day | ORAL | Status: DC
  Administered 2021-10-28 – 2021-10-31 (×4): 137 ug via ORAL
  Filled 2021-10-28 (×4): qty 1

## 2021-10-28 MED ORDER — SERTRALINE HCL 50 MG PO TABS
50.0000 mg | ORAL_TABLET | Freq: Every day | ORAL | Status: DC
  Administered 2021-10-28 – 2021-10-31 (×4): 50 mg via ORAL
  Filled 2021-10-28 (×4): qty 1

## 2021-10-28 MED ORDER — OLANZAPINE 5 MG PO TABS
2.5000 mg | ORAL_TABLET | Freq: Every day | ORAL | Status: DC
  Administered 2021-10-28: 2.5 mg via ORAL
  Filled 2021-10-28: qty 1

## 2021-10-28 MED ORDER — ALBUMIN HUMAN 25 % IV SOLN
25.0000 g | Freq: Once | INTRAVENOUS | Status: AC
  Administered 2021-10-28: 25 g via INTRAVENOUS
  Filled 2021-10-28: qty 100

## 2021-10-28 MED ORDER — SODIUM CHLORIDE 0.9 % IV SOLN: INTRAVENOUS | Status: DC

## 2021-10-28 MED ORDER — LAMOTRIGINE 100 MG PO TABS
200.0000 mg | ORAL_TABLET | Freq: Every day | ORAL | Status: DC
  Administered 2021-10-28 – 2021-10-30 (×3): 200 mg via ORAL
  Filled 2021-10-28 (×3): qty 2

## 2021-10-28 MED ORDER — ALUM & MAG HYDROXIDE-SIMETH 200-200-20 MG/5ML PO SUSP
30.0000 mL | Freq: Four times a day (QID) | ORAL | Status: DC | PRN
  Administered 2021-10-31: 30 mL via ORAL
  Filled 2021-10-28: qty 30

## 2021-10-28 MED ORDER — ROSUVASTATIN CALCIUM 20 MG PO TABS
40.0000 mg | ORAL_TABLET | Freq: Every day | ORAL | Status: DC
  Administered 2021-10-28 – 2021-10-31 (×4): 40 mg via ORAL
  Filled 2021-10-28 (×5): qty 2

## 2021-10-28 MED ORDER — SODIUM CHLORIDE 0.9 % IV SOLN
100.0000 mg | Freq: Two times a day (BID) | INTRAVENOUS | Status: DC
  Administered 2021-10-28 – 2021-10-29 (×3): 100 mg via INTRAVENOUS
  Filled 2021-10-28 (×3): qty 100

## 2021-10-28 MED ORDER — LORAZEPAM 2 MG/ML IJ SOLN
0.5000 mg | Freq: Two times a day (BID) | INTRAMUSCULAR | Status: DC
  Administered 2021-10-29 (×2): 0.5 mg via INTRAVENOUS
  Filled 2021-10-28 (×2): qty 1

## 2021-10-28 MED ORDER — ORAL CARE MOUTH RINSE: 15.0000 mL | OROMUCOSAL | Status: DC | PRN

## 2021-10-28 MED ORDER — INSULIN ASPART 100 UNIT/ML IJ SOLN
0.0000 [IU] | Freq: Three times a day (TID) | INTRAMUSCULAR | Status: DC
  Administered 2021-10-28: 2 [IU] via SUBCUTANEOUS
  Administered 2021-10-30 (×2): 1 [IU] via SUBCUTANEOUS
  Administered 2021-10-31: 3 [IU] via SUBCUTANEOUS
  Filled 2021-10-28: qty 0.09

## 2021-10-28 MED ORDER — HYDROXYZINE HCL 25 MG PO TABS
25.0000 mg | ORAL_TABLET | Freq: Four times a day (QID) | ORAL | Status: DC | PRN
  Administered 2021-10-28 – 2021-10-31 (×6): 25 mg via ORAL
  Filled 2021-10-28 (×6): qty 1

## 2021-10-28 NOTE — Assessment & Plan Note (Signed)
Continue CPAP nightly. °

## 2021-10-28 NOTE — Assessment & Plan Note (Signed)
Continue rosuvastatin.  

## 2021-10-28 NOTE — Progress Notes (Addendum)
Occupational Therapy Evaluation Patient Details Name: Danielle [name] MRN: [id number] DOB: 05-31-1945 Today's Date: 10/28/2021   History of Present Illness Arisa Congleton is a 76 y.o. female with medical history significant for T2DM, HTN, HLD, hypothyroidism, depression/anxiety, OSA on CPAP, memory loss who presented to the ED for evaluation after falls at home.     Patient reports 2 falls prior to arrival today.  She says the first fall was while she was cleaning her bathtub she slipped and fell face first in the bathtub.  She did not lose consciousness.  She was unable to get out of tub herself until her husband was able to help her out about 45 minutes later.     She says she had another fall when she was in her son's house. They have a step down in the living room where she tripped and fell backwards landing on her back.  She says she felt nauseous afterwards but again did not lose consciousness.  She has had some lightheadedness and dizziness recently.  She says that she has had low appetite and poor oral intake.  She reports increased urinary frequency with some suprapubic discomfort but no dysuria.  She has had loose stools these last couple days.   Clinical Impression   The patient reported having 2 recent falls prior to this hospitalization. Per her spouse, she has been presenting with gradual memory impairment over the past few months, as well as increased unsteadiness/impaired balance. As such, he has had to provide increased supervision, as well as assistance with tasks such as lower body dressing; in addition, he has been managing the majority of the household chores. During the session today, the pt presented with reports of moderate dizziness with activity, unsteadiness in standing, occasional memory deficits, and disorientation to month. She required supervision for supine to sit, min guard assist to stand, min assist for simulated lower body dressing, and min guard to min assist for  ambulating a short distance in her room. She will benefit from further OT services to maximize her safety and independence with self-care tasks. Her spouse prefers to take her home with home therapy once she discharges from the hospital. If the pt returns home at discharge, she will need significant supervision, as she is currently at high risk for falls and the potential for unintentional safety events.      Recommendations for follow up therapy are one component of a multi-disciplinary discharge planning process, led by the attending physician.  Recommendations may be updated based on patient status, additional functional criteria and insurance authorization.   Follow Up Recommendations  Skilled nursing-short term rehab (<3 hours/day) vs. Home health therapy and family supervision   Assistance Recommended at Discharge Intermittent Supervision/Assistance  Patient can return home with the following A little help with walking and/or transfers;A little help with bathing/dressing/bathroom;Assistance with cooking/housework;Direct supervision/assist for financial management;Assist for transportation;Direct supervision/assist for medications management    Functional Status Assessment  Patient has had a recent decline in their functional status and demonstrates the ability to make significant improvements in function in a reasonable and predictable amount of time.  Equipment Recommendations  None recommended by OT       Precautions / Restrictions Precautions Precautions: Fall Restrictions Weight Bearing Restrictions: No Other Position/Activity Restrictions: acute L3 compression fracture      Mobility Bed Mobility Overal bed mobility: Needs Assistance Bed Mobility: Supine to Sit     Supine to sit: Supervision, HOB elevated  Transfers Overall transfer level: Needs assistance   Transfers: Sit to/from Stand Sit to Stand: Min guard           Balance Overall balance  assessment: Needs assistance   Sitting balance-Leahy Scale: Good         Standing balance comment: min guard to min assist             ADL either performed or assessed with clinical judgement   ADL Overall ADL's : Needs assistance/impaired Eating/Feeding: Independent Eating/Feeding Details (indicate cue type and reason): based on clinical judgement Grooming: Set up;Min guard Grooming Details (indicate cue type and reason): Set-up seated or min guard standing         Upper Body Dressing : Set up;Supervision/safety Upper Body Dressing Details (indicate cue type and reason): based on clinical judgement Lower Body Dressing: Minimal assistance                       Vision   Additional Comments: She correctly read the time depicted on the wall clock.            Pertinent Vitals/Pain Pain Assessment Pain Assessment: 0-10 Pain Score: 4  Pain Location: L hand/wrist and back Pain Intervention(s): Limited activity within patient's tolerance     Hand Dominance Left   Extremity/Trunk Assessment Upper Extremity Assessment Upper Extremity Assessment: Overall WFL for tasks assessed   Lower Extremity Assessment Lower Extremity Assessment: Overall WFL for tasks assessed       Communication     Cognition Arousal/Alertness: Awake/alert     Area of Impairment: Orientation, Memory                 Orientation Level: Disoriented to (Month)     Following Commands: Follows one step commands consistently                        Home Living Family/patient expects to be discharged to:: Private residence Living Arrangements: Spouse/significant other Available Help at Discharge: Family Type of Home: House Home Access: Stairs to enter Technical brewer of Steps: 2   Home Layout: Two level;1/2 bath on main level (Full bathroom and bedrooms are upstairs)     Bathroom Shower/Tub: Walk-in shower         Home Equipment: Conservation officer, nature (2  wheels);Shower seat;Shower seat - built in;Cane - single point          Prior Functioning/Environment               Mobility Comments: She ambulates in the home without an assistive device, however uses a cane when outside. Her spouse reported he has had to provide close supervision over the past few weeks when she ambulates, given her increased unsteadiness. ADLs Comments: Per her spouse, he has been assisting her with lower body dressing over the past few weeks, and he has also been managing the cooking and cleaning. The patient does not drive.        OT Problem List: Impaired balance (sitting and/or standing);Decreased cognition;Decreased knowledge of precautions;Pain      OT Treatment/Interventions: Self-care/ADL training;Therapeutic exercise;Neuromuscular education;Energy conservation;DME and/or AE instruction;Therapeutic activities;Cognitive remediation/compensation;Patient/family education;Balance training    OT Goals(Current goals can be found in the care plan section) Acute Rehab OT Goals Patient Stated Goal: To figure out what is going on with her medically OT Goal Formulation: With patient/family Time For Goal Achievement: 11/11/21 Potential to Achieve Goals: Good ADL Goals Pt Will Perform Grooming: with  modified independence;standing Pt Will Perform Lower Body Dressing: with modified independence;sit to/from stand Pt Will Transfer to Toilet: with modified independence Pt Will Perform Toileting - Clothing Manipulation and hygiene: with modified independence  OT Frequency: Min 2X/week       AM-PAC OT "6 Clicks" Daily Activity     Outcome Measure Help from another person eating meals?: None Help from another person taking care of personal grooming?: A Little Help from another person toileting, which includes using toliet, bedpan, or urinal?: A Little Help from another person bathing (including washing, rinsing, drying)?: A Lot Help from another person to put on and  taking off regular upper body clothing?: A Little Help from another person to put on and taking off regular lower body clothing?: A Little 6 Click Score: 18   End of Session Equipment Utilized During Treatment: Gait belt Nurse Communication: Other (comment) (Nurse cleared the pt for therapy participation)  Activity Tolerance: Patient tolerated treatment well Patient left: in bed;with call bell/phone within reach;with family/visitor present  OT Visit Diagnosis: Unsteadiness on feet (R26.81);Repeated falls (R29.6)                Time: 7425-9563 OT Time Calculation (min): 43 min Charges:  OT General Charges $OT Visit: 1 Visit OT Evaluation $OT Eval Moderate Complexity: 1 Mod OT Treatments $Therapeutic Activity: 8-22 mins   Leota Sauers, OTR/L 10/28/2021, 12:17 PM

## 2021-10-28 NOTE — ED Notes (Signed)
Pt ambulated to restroom and back to room hand held assist.

## 2021-10-28 NOTE — Hospital Course (Signed)
Danielle [name] is a 76 y.o. female with medical history significant for T2DM, HTN, HLD, hypothyroidism, depression/anxiety, OSA on CPAP, memory loss who is admitted with hypovolemic hypotension with AKI and acute L3 compression fracture.

## 2021-10-28 NOTE — Assessment & Plan Note (Addendum)
Synthroid 

## 2021-10-28 NOTE — Evaluation (Signed)
Physical Therapy Evaluation Patient Details Name: Danielle [name] MRN: [id number] DOB: 1945/08/21 Today's Date: 10/28/2021  History of Present Illness  Pt is as 76 y/o F admitted on 10/27/21 after presenting to the ED with c/o 2 falls that day. Pt with c/o neck pain & pain along L side of body. Imaging revealed L3 compression fx but neurosurgery not recommending sx at this time. Based on labs, pt felt to be in AKI. PMH: dementia, HTN, high cholesterol, mitral regurgitation, CAD, DM  Clinical Impression  Pt seen for PT evaluation. Pt reports prior to admission she was residing with her husband & was ambulating without AD but beginning to use her cane more often. Pt is AxOx4 (year but not month) & reports 2 falls prior to admission but doesn't recall falling on/injuring her L side. Pt exhibiting paranoid behaviors during session - nurse in room & aware & MD notified. Pt also endorse having visual hallucinations (MD also notified). Pt is able to complete supine>sit with mod I & STS & step pivot with HHA CGA<>min assist. Pt endorses dizziness upon standing, BP noted below. Pt ambulates short distance into hallway with RW & CGA with impaired gait pattern as noted below. Pt would benefit from STR upon d/c to maximize independence with functional mobility, decrease caregiver burden, & reduce fall risk prior to return home. Will continue to follow pt acutely to address balance, gait with LRAD, and stair negotiation as pt has a full flight to access her bedroom on the 2nd level.   Recommendations for follow up therapy are one component of a multi-disciplinary discharge planning process, led by the attending physician.  Recommendations may be updated based on patient status, additional functional criteria and insurance authorization.  Follow Up Recommendations Skilled nursing-short term rehab (<3 hours/day) Can patient physically be transported by private vehicle: Yes    Assistance Recommended at Discharge  Frequent or constant Supervision/Assistance  Patient can return home with the following  A little help with walking and/or transfers;A little help with bathing/dressing/bathroom;Assistance with cooking/housework;Assist for transportation;Help with stairs or ramp for entrance    Equipment Recommendations None recommended by PT  Recommendations for Other Services       Functional Status Assessment Patient has had a recent decline in their functional status and demonstrates the ability to make significant improvements in function in a reasonable and predictable amount of time.     Precautions / Restrictions Precautions Precautions: Fall;Back Precaution Booklet Issued: No Restrictions Weight Bearing Restrictions: No Other Position/Activity Restrictions: acute L3 compression fracture      Mobility  Bed Mobility Overal bed mobility: Modified Independent       Supine to sit: Modified independent (Device/Increase time), HOB elevated     General bed mobility comments: use of bed rails PRN    Transfers Overall transfer level: Needs assistance Equipment used: 1 person hand held assist Transfers: Sit to/from Stand, Bed to chair/wheelchair/BSC Sit to Stand: Min guard   Step pivot transfers: Min assist (LUE HHA, cuing to turn & back up to recliner)            Ambulation/Gait Ambulation/Gait assistance: Min guard Gait Distance (Feet): 35 Feet Assistive device: Rolling walker (2 wheels) Gait Pattern/deviations: Decreased step length - right, Decreased step length - left, Decreased stride length Gait velocity: decreased     General Gait Details: decreased heel strike BLE  Stairs            Wheelchair Mobility    Modified Rankin (Stroke Patients Only)  Balance Overall balance assessment: Needs assistance, History of Falls Sitting-balance support: Feet supported, No upper extremity supported Sitting balance-Leahy Scale: Good     Standing balance support:  During functional activity, No upper extremity supported Standing balance-Leahy Scale: Fair                               Pertinent Vitals/Pain Pain Assessment Pain Assessment: Faces Faces Pain Scale: Hurts a little bit Pain Location: L arm (Pt reports "I must have fell on that side" as she doesn't recall) Pain Descriptors / Indicators: Grimacing, Discomfort Pain Intervention(s): Monitored during session, Repositioned    Home Living Family/patient expects to be discharged to:: Private residence Living Arrangements: Spouse/significant other Available Help at Discharge: Family Type of Home: House Home Access: Stairs to enter   Technical brewer of Steps: 2 Alternate Level Stairs-Number of Steps: flight Home Layout: Two level;1/2 bath on main level        Prior Function               Mobility Comments: Pt reports she typically ambulates without AD but has started using her cane more. Endorses 2 falls leading to admission.  Per OT report "She ambulates in the home without an assistive device, however uses a cane when outside. Her spouse reported he has had to provide close supervision over the past few weeks when she ambulates, given her increased unsteadiness." ADLs Comments: Per OT report "Per her spouse, he has been assisting her with lower body dressing over the past few weeks, and he has also been managing the cooking and cleaning. The patient does not drive."     Hand Dominance        Extremity/Trunk Assessment   Upper Extremity Assessment Upper Extremity Assessment: Overall WFL for tasks assessed    Lower Extremity Assessment Lower Extremity Assessment: Generalized weakness       Communication   Communication: No difficulties  Cognition Arousal/Alertness: Awake/alert   Overall Cognitive Status: Within Functional Limits for tasks assessed Area of Impairment: Orientation, Memory, Safety/judgement, Awareness                  Orientation Level: Disoriented to (month)     Following Commands: Follows one step commands consistently Safety/Judgement: Decreased awareness of safety, Decreased awareness of deficits Awareness: Anticipatory, Emergent   General Comments: Pt demonstrating paranoid behaviors (Pt worried about locating her wedding rings, feels her husband tricked them into giving them to her), also reports visual hallucinations (sees shadows). Nurse & MD notified.        General Comments      Exercises     Assessment/Plan    PT Assessment Patient needs continued PT services  PT Problem List Decreased strength;Decreased activity tolerance;Decreased balance;Decreased mobility;Decreased knowledge of precautions;Decreased knowledge of use of DME;Decreased safety awareness;Decreased cognition       PT Treatment Interventions DME instruction;Therapeutic exercise;Gait training;Balance training;Neuromuscular re-education;Stair training;Functional mobility training;Therapeutic activities;Patient/family education;Cognitive remediation    PT Goals (Current goals can be found in the Care Plan section)  Acute Rehab PT Goals Patient Stated Goal: find her wedding rings PT Goal Formulation: With patient Time For Goal Achievement: 11/11/21 Potential to Achieve Goals: Fair    Frequency Min 3X/week     Co-evaluation               AM-PAC PT "6 Clicks" Mobility  Outcome Measure Help needed turning from your back to your side while in a  flat bed without using bedrails?: None Help needed moving from lying on your back to sitting on the side of a flat bed without using bedrails?: None Help needed moving to and from a bed to a chair (including a wheelchair)?: A Little Help needed standing up from a chair using your arms (e.g., wheelchair or bedside chair)?: A Little Help needed to walk in hospital room?: A Little Help needed climbing 3-5 steps with a railing? : A Little 6 Click Score: 20    End of  Session   Activity Tolerance: Patient tolerated treatment well Patient left: in chair;with chair alarm set;with call bell/phone within reach Nurse Communication: Mobility status (BP) PT Visit Diagnosis: Unsteadiness on feet (R26.81);History of falling (Z91.81);Muscle weakness (generalized) (M62.81)    Time: 9518-8416 PT Time Calculation (min) (ACUTE ONLY): 18 min   Charges:   PT Evaluation $PT Eval Moderate Complexity: [place], PT, DPT 10/28/21, 4:30 PM   Waunita Schooner 10/28/2021, 4:27 PM

## 2021-10-28 NOTE — Progress Notes (Addendum)
PROGRESS NOTE    Danielle [name]  PIR:[id number] DOB: 11/24/1945 DOA: 10/27/2021 PCP: [name], MD    Chief Complaint  Patient presents with   Fall   Altered Mental Status    Brief Narrative:  Danielle [name] is a 76 y.o. female with medical history significant for T2DM, HTN, HLD, hypothyroidism, depression/anxiety, OSA on CPAP, memory loss who is admitted with hypovolemic hypotension with AKI and acute L3 compression fracture.    Assessment & Plan:  Principal Problem:   Hypotension Active Problems:   AKI (acute kidney injury) ([place])   Closed compression fracture of L3 lumbar vertebra, initial encounter (HCC)   OSA (obstructive sleep apnea)   Hyperlipidemia LDL goal <70   Hypothyroidism   Depression with anxiety   Urinary tract infection without hematuria   UTI (urinary tract infection)   Hallucinations    Assessment and Plan: * Hypotension Likely hypovolemic hypotension related to poor oral intake and recent loose stools plus medication effect.  -Noted to have leukocytosis.  -Patient on IV fluid boluses, will place on IV fluids continuously and monitor.  -Urinalysis concerning for UTI.  -Urine cultures pending.  -Blood cultures pending. -Place empirically on IV doxycycline.  -Continue to hold home antihypertensive medications.  -IV albumin x1.  AKI (acute kidney injury) ([place]) Creatinine 2.66 on admission compared to baseline around 0.9.  Suspect prerenal from hypovolemia.  CT A/P with normal-appearing kidneys, no renal calculi, focal lesion, or hydronephrosis. -Renal function trending down creatinine currently at 2.25 this morning. -Urine sodium of 77, urine creatinine of 84. -Continue IV fluid hydration as above -Monitor UOP, bladder scan and in and out cath as needed -Continue to hold losartan, HCTZ, and metformin  Closed compression fracture of L3 lumbar vertebra, initial encounter (HCC) Acute compression fracture of the superior endplate  of L3 with 2-3 mm height loss, no retropulsion seen on CT.  Suspect current due to one of her falls prior to arrival.  EDP spoke with on-call neurosurgery who felt this was mild and recommended conservative management.  Not having much pain on admission. -PT/OT eval -Tylenol as needed, avoiding narcotics for now due to hypotension and NSAIDs due to AKI and concerns for hallucinations per family.  Hallucinations - Per family patient has been having visual hallucinations that have been worsening. -Patient noted with depression and anxiety. -Patient admitted with hypotension, urinalysis concerning for UTI which may be etiology of patient's hallucinations. -Urine cultures pending. -Blood cultures pending. -Check RPR, TSH, folate levels, vitamin B12 levels. -Start IV doxycycline. -Resume antidepressant medications. -If no improvement with hallucinations after treatment of UTI will need input from psychiatry.  UTI (urinary tract infection) - Urinalysis concerning for UTI. -Urine cultures pending. -Place on IV doxycycline secondary to allergy profile.  Depression with anxiety She has had some recent adjustments in her medications by behavioral health. -Family stating patient has been having visual hallucinations and states patient does not have any history of dementia. -Patient also noted with probable UTI which may be exacerbating hallucinations, treated with IV antibiotics and if no improvement after treatment may consider psych evaluation for hallucinations. -Resume home regimen Zyprexa, Zoloft, Lamictal.  -Place back on half home dose of Ativan 0.5 mg twice daily.  Hypothyroidism Synthroid.   Hyperlipidemia LDL goal <70 Continue rosuvastatin.  OSA (obstructive sleep apnea) Continue CPAP nightly.         DVT prophylaxis: Heparin Code Status: Full Family Communication: Updated husband and son at bedside. Disposition: To be determined  Status is: Inpatient  The patient will  require care spanning > 2 midnights and should be moved to inpatient because: Severity of illness   Consultants:  None  Procedures:  CT head 10/27/2021 CT C-spine 10/27/2021 Chest x-ray 10/27/2021  Antimicrobials:  IV doxycycline 10/28/2021>>>>   Subjective: Patient laying on gurney in the ED.  Husband and son at bedside.  Per husband and son patient with some hallucinations.  Patient also does endorse some visual hallucinations states last hallucination was 3 to 4 days ago.  Husband and son frustrated that decision on whether patient has a UTI or not is ever changing depending on which MD she sees.  Patient currently denies any chest pain, no shortness of breath, does endorse lower abdominal pain.  Objective: Vitals:   10/28/21 1200 10/28/21 1439 10/28/21 1530 10/28/21 1939  BP: 119/75 131/82 (!) 145/77 (!) 119/55  Pulse: 76 86 80 76  Resp: 16 (!) '22 20 20  '$ Temp:  97.6 F (36.4 C) 98 F (36.7 C) 98.2 F (36.8 C)  TempSrc:  Oral  Oral  SpO2: 97% 100% 97% 96%  Weight:      Height:        Intake/Output Summary (Last 24 hours) at 10/28/2021 2146 Last data filed at 10/28/2021 2110 Gross per 24 hour  Intake 3438.92 ml  Output 550 ml  Net 2888.92 ml   Filed Weights   10/27/21 1715  Weight: 93.3 kg    Examination:  General exam: Appears calm and comfortable  Respiratory system: Clear to auscultation.  No wheezes, no crackles, no rhonchi.  Fair air movement.  Speaking in full sentences.  Respiratory effort normal. Cardiovascular system: Regular rate rhythm no murmurs rubs or gallops.  No JVD.  No lower extremity edema.  Gastrointestinal system: Abdomen is nondistended, soft and tender to palpation in the suprapubic region.  Positive bowel sounds.  No rebound.  No guarding.  Central nervous system: Alert and oriented. No focal neurological deficits. Extremities: Symmetric 5 x 5 power. Skin: No rashes, lesions or ulcers Psychiatry: Judgement and insight appear normal. Mood  & affect appropriate.     Data Reviewed:   CBC: Recent Labs  Lab 10/27/21 1922 10/28/21 0540  WBC 14.3* 9.9  NEUTROABS 10.3*  --   HGB 14.0 12.9  HCT 41.1 39.0  MCV 87.4 89.9  PLT 252 253    Basic Metabolic Panel: Recent Labs  Lab 10/27/21 1922 10/28/21 0540  NA 138 140  K 3.5 3.5  CL 102 110  CO2 26 25  GLUCOSE 158* 91  BUN 36* 30*  CREATININE 2.66* 2.25*  CALCIUM 9.1 8.5*    GFR: Estimated Creatinine Clearance: 23 mL/min (A) (by C-G formula based on SCr of 2.25 mg/dL (H)).  Liver Function Tests: No results for input(s): "AST", "ALT", "ALKPHOS", "BILITOT", "PROT", "ALBUMIN" in the last 168 hours.  CBG: Recent Labs  Lab 10/28/21 1200 10/28/21 1708 10/28/21 1936  GLUCAP 157* 98 137*     No results found for this or any previous visit (from the past 240 hour(s)).       Radiology Studies: CT CHEST ABDOMEN PELVIS WO CONTRAST  Result Date: 10/27/2021 CLINICAL DATA:  Two falls today with pain in lower back and left hip EXAM: CT CHEST, ABDOMEN AND PELVIS WITHOUT CONTRAST TECHNIQUE: Multidetector CT imaging of the chest, abdomen and pelvis was performed following the standard protocol without IV contrast. RADIATION DOSE REDUCTION: This exam was performed according to the departmental dose-optimization program which includes automated exposure control, adjustment of the  mA and/or kV according to patient size and/or use of iterative reconstruction technique. COMPARISON:  CT abdomen and pelvis 09/27/2021 FINDINGS: CT CHEST FINDINGS Cardiovascular: Borderline cardiomegaly. Coronary artery atherosclerotic calcification. Aortic valve and mitral annular calcification. Mediastinum/Nodes: No enlarged mediastinal, hilar, or axillary lymph nodes. Thyroid gland, trachea, and esophagus demonstrate no significant findings. Lungs/Pleura: Patchy areas of scarring/atelectasis. No focal consolidation, pleural effusion, or pneumothorax. Musculoskeletal: No rib fractures. CT ABDOMEN  PELVIS FINDINGS Hepatobiliary: No focal liver abnormality is seen. Status post cholecystectomy. No biliary dilatation. Pancreas: Unremarkable. No pancreatic ductal dilatation or surrounding inflammatory changes. Spleen: Normal in size without focal abnormality. Adrenals/Urinary Tract: Adrenal glands are unremarkable. Kidneys are normal, without renal calculi, suspicious focal lesion, or hydronephrosis. Bladder and inferior pelvis are poorly evaluated due to streak artifact from bilateral THA. Stomach/Bowel: Stomach is within normal limits. The appendix is not visualized. No evidence of bowel wall thickening, distention, or inflammatory changes. Vascular/Lymphatic: Aortic atherosclerosis. No enlarged abdominal or pelvic lymph nodes. Reproductive: Status post hysterectomy. Poorly evaluated due to streak artifact. Other: No free intraperitoneal fluid or air. Musculoskeletal: Acute compression fracture of the superior endplate of L3 with a proximally 2-3 mm of compression. No retropulsion. Otherwise no acute osseous abnormality. Thoracolumbar spondylosis. Chronic left-sided L5 pars defects. Grade 1 anterolisthesis L4 on L5. No definite pelvic fracture. Bilateral THA. IMPRESSION: Acute compression fracture of the superior endplate of L3 with 2-3 mm of height loss. No retropulsion. Otherwise no acute traumatic abnormality in the chest, abdomen, or pelvis. Coronary artery and Aortic Atherosclerosis (ICD10-I70.0). Electronically Signed   By: Placido Sou M.D.   On: 10/27/2021 21:28   CT Cervical Spine Wo Contrast  Result Date: 10/27/2021 CLINICAL DATA:  Trauma.  Fall. EXAM: CT CERVICAL SPINE WITHOUT CONTRAST TECHNIQUE: Multidetector CT imaging of the cervical spine was performed without intravenous contrast. Multiplanar CT image reconstructions were also generated. RADIATION DOSE REDUCTION: This exam was performed according to the departmental dose-optimization program which includes automated exposure control,  adjustment of the mA and/or kV according to patient size and/or use of iterative reconstruction technique. COMPARISON:  None Available. FINDINGS: Alignment: Normal. Skull base and vertebrae: The bones are osteopenic. There is no acute fracture. No focal osseous lesion. C5, C6, C7 anterior fusion plate is present without complication. Soft tissues and spinal canal: No prevertebral fluid or swelling. No visible canal hematoma. Disc levels: There is disc space narrowing at C2-C3, C3-C4, C4-C5 C7 T1 compatible with degenerative change. Moderate bilateral neural foraminal stenosis at C3-C4 and mild bilateral neural foraminal stenosis at C4-C5 secondary to uncovertebral spurring. No severe central canal stenosis identified at any level. Upper chest: Negative. Other: Small air-fluid levels in the bilateral maxillary sinuses. IMPRESSION: 1. No acute fracture or traumatic subluxation of the cervical spine. 2. C5-C7 anterior fusion plate without complication. 3. Small air-fluid levels in the bilateral maxillary sinuses. Correlate for acute sinusitis. Electronically Signed   By: Ronney Asters M.D.   On: 10/27/2021 21:26   CT Head Wo Contrast  Result Date: 10/27/2021 CLINICAL DATA:  Head trauma EXAM: CT HEAD WITHOUT CONTRAST TECHNIQUE: Contiguous axial images were obtained from the base of the skull through the vertex without intravenous contrast. RADIATION DOSE REDUCTION: This exam was performed according to the departmental dose-optimization program which includes automated exposure control, adjustment of the mA and/or kV according to patient size and/or use of iterative reconstruction technique. COMPARISON:  CT brain 07/16/2021 FINDINGS: Brain: No acute territorial infarction, hemorrhage or intracranial mass. Mild chronic small vessel ischemic changes of  the white matter. The ventricles are nonenlarged Vascular: No hyperdense vessel or unexpected calcification. Skull: Normal. Negative for fracture or focal lesion.  Sinuses/Orbits: No acute finding. Other: None IMPRESSION: 1. No CT evidence for acute intracranial abnormality. 2. Mild chronic small vessel ischemic changes of the white matter Electronically Signed   By: Donavan Foil M.D.   On: 10/27/2021 21:18   DG Chest Port 1 View  Result Date: 10/27/2021 CLINICAL DATA:  Fall EXAM: PORTABLE CHEST 1 VIEW COMPARISON:  12/29/2017 FINDINGS: Cardiomegaly with pulmonary vascular congestion. Streaky left perihilar opacity. No large pleural fluid collection. No pneumothorax. IMPRESSION: 1. Cardiomegaly with pulmonary vascular congestion. 2. Streaky left perihilar opacity may represent atelectasis or pneumonia. Electronically Signed   By: Davina Poke D.O.   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 Arthroplasty components are in their expected alignment. No periprosthetic lucency or fracture. Pelvic bony ring intact. Partially imaged right hip arthroplasty appears intact. No appreciable soft tissue abnormality. IMPRESSION: No acute osseous abnormality. 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 Electronically Signed   By: Davina Poke D.O.   On: 10/27/2021 18:07        Scheduled Meds:  fluticasone  2 spray Each Nare Daily   heparin  5,000 Units Subcutaneous Q8H   insulin aspart  0-9 Units Subcutaneous TID WC   lamoTRIgine  200 mg Oral Daily   levothyroxine  137 mcg Oral Q0600   LORazepam  0.5 mg Intravenous BID   OLANZapine  2.5 mg Oral QHS   rosuvastatin  40 mg Oral Daily   sertraline  50 mg Oral Daily   Continuous Infusions:  sodium chloride 100 mL/hr at 10/28/21 1625   doxycycline (VIBRAMYCIN) IV 100 mg (10/28/21 2048)     LOS: 0 days    Time spent: 40 minutes    Irine Seal,  MD [facility]   To contact the attending provider between 7A-7P or the covering provider during after hours 7P-7A, please log into the web site [email or web address] and access using universal [place] password for that web site. If you do not have the password, please call the hospital operator.  10/28/2021, 9:46 PM

## 2021-10-28 NOTE — Assessment & Plan Note (Addendum)
Creatinine 2.66 on admission compared to baseline around 0.9.  Suspect prerenal from hypovolemia.  CT A/P with normal-appearing kidneys, no renal calculi, focal lesion, or hydronephrosis. -Renal function trending down creatinine currently at 2.25 this morning. -Urine sodium of 77, urine creatinine of 84. -Continue IV fluid hydration as above -Monitor UOP, bladder scan and in and out cath as needed -Continue to hold losartan, HCTZ, and metformin

## 2021-10-28 NOTE — Assessment & Plan Note (Addendum)
Likely hypovolemic hypotension related to poor oral intake and recent loose stools plus medication effect.  -Noted to have leukocytosis.  -Patient on IV fluid boluses, will place on IV fluids continuously and monitor.  -Urinalysis concerning for UTI.  -Urine cultures pending.  -Blood cultures pending. -Place empirically on IV doxycycline.  -Continue to hold home antihypertensive medications.  -IV albumin x1.

## 2021-10-28 NOTE — Assessment & Plan Note (Signed)
-   Per family patient has been having visual hallucinations that have been worsening. -Patient noted with depression and anxiety. -Patient admitted with hypotension, urinalysis concerning for UTI which may be etiology of patient's hallucinations. -Urine cultures pending. -Blood cultures pending. -Check RPR, TSH, folate levels, vitamin B12 levels. -Start IV doxycycline. -Resume antidepressant medications. -If no improvement with hallucinations after treatment of UTI will need input from psychiatry.

## 2021-10-28 NOTE — ED Notes (Addendum)
This RN called 4th floor to see if they were ready to receive pt yet as report was sent 45mn ago. Was told to give 10 mins.

## 2021-10-28 NOTE — ED Notes (Signed)
Pt resting in bed with eyes closed, snoring. O2 sat dropped to 86% on RA. Placed pt on 2L O2 via [place]. O2 sat went up to 96%. Pt resting comfortably. No distress noted. RR even and unlabored

## 2021-10-28 NOTE — Progress Notes (Signed)
Pt has declined CPAP for the night.

## 2021-10-28 NOTE — Assessment & Plan Note (Addendum)
She has had some recent adjustments in her medications by behavioral health. -Family stating patient has been having visual hallucinations and states patient does not have any history of dementia. -Patient also noted with probable UTI which may be exacerbating hallucinations, treated with IV antibiotics and if no improvement after treatment may consider psych evaluation for hallucinations. -Resume home regimen Zyprexa, Zoloft, Lamictal.  -Place back on half home dose of Ativan 0.5 mg twice daily.

## 2021-10-28 NOTE — Progress Notes (Signed)
Patient refused CPAP qhs.  Pt states that she has not had her CPAP at home for very long and has not gotten used to wearing it yet.  States that she does not want to use it while here.

## 2021-10-28 NOTE — Assessment & Plan Note (Addendum)
Acute compression fracture of the superior endplate of L3 with 2-3 mm height loss, no retropulsion seen on CT.  Suspect current due to one of her falls prior to arrival.  EDP spoke with on-call neurosurgery who felt this was mild and recommended conservative management.  Not having much pain on admission. -PT/OT eval -Tylenol as needed, avoiding narcotics for now due to hypotension and NSAIDs due to AKI and concerns for hallucinations per family.

## 2021-10-28 NOTE — H&P (Incomplete)
History and Physical    Sullivan Blasing RJJ:[id number] DOB: [birth year] DOA: 10/27/2021  PCP: [name], MD  Patient coming from: Home via EMS  I have personally briefly reviewed patient's old medical records in [place]  Chief Complaint: Falls at home  HPI: Danielle [name] is a 76 y.o. female with medical history significant for T2DM, HTN, HLD, hypothyroidism, depression/anxiety, OSA on CPAP, memory loss who presented to the ED for evaluation after falls at home.  Patient reports 2 falls prior to arrival today.  She says the first fall was while she was cleaning her bathtub she slipped and fell face first in the bathtub.  She did not lose consciousness.  She was unable to get out of tub herself until her husband was able to help her out about 45 minutes later.  She says she had another fall when she was in her son's house.  They have a step down in the living room where she tripped and fell backwards landing on her back.  She says she felt nauseous afterwards but again did not lose consciousness.  She has had some lightheadedness and dizziness recently.  She says that she has had low appetite and poor oral intake.  She reports increased urinary frequency with some suprapubic discomfort but no dysuria.  She has had loose stools these last couple days.  ED Course  Labs/Imaging on admission: I have personally reviewed following labs and imaging studies.  Initial vitals showed BP 113/40, pulse 72, RR 12, temp 97.3 F, SPO2 94% on room air.  While in the ED patient became hypotensive with BP as low as 78/43, improved to 114/56 with IV fluids.  Labs show BUN 36, creatinine 2.66 (baseline creatinine 0.9), serum glucose 158, sodium 138, potassium 3.5, bicarb 26, WBC 14.3, hemoglobin 14.0, platelets 252,000.  Pelvic x-ray negative for acute osseous abnormality.  Prior left THA changes seen without complication.  Portable chest x-ray shows cardiomegaly with pulmonary  vascular congestion.  CT head without contrast negative for acute endocrine abnormality.  CT cervical spine without contrast negative for acute fracture or traumatic subluxation of the C-spine.  CT chest/abdomen/pelvis without contrast shows an acute compression fracture of the superior endplate of L3 with 2-3 mm of height loss.  No retropulsion.  Otherwise no acute abnormality in the chest, abdomen, pelvis.  Patient was given 1 L normal saline.  EDP discussed with on-call neurosurgery who felt L2 compression fracture with minimal and recommended conservative management with pain control.  The hospitalist service was consulted to admit for further evaluation and management.  Review of Systems: All systems reviewed and are negative except as documented in history of present illness above.   Past Medical History:  Diagnosis Date  . Arthritis 02/13/2011   osteoarthritis, hips, knees,s/p Cervical fusion(DDD)  . Atherosclerosis of aorta ([place]) 05/30/2015   on CTA chest  . CAD (coronary artery disease)    a. 09/2016: Coronary CT showing mid-LAD plaque with 20-50% associated stenosis but no significant stenosis by FFR analysis.   . Dementia ([place])   . Depression with anxiety   . Diabetes mellitus without complication ([place])   . Difficult intubation 02/13/2011   with surgery -multiple times, no problems with recent surgeries  . GERD (gastroesophageal reflux disease) 02/13/2011   tx. Omeprazole  . Glaucoma 02/13/2011   bil. tx. eye drops daily  . Headache(784.0) 02/13/2011   past hx. migraines, none recent  . Heart murmur 02/13/2011   was told-no issues with this  .  Hypercholesterolemia   . Hypertension 02/13/2011  . Hypothyroidism 02/13/2011   tx. with Levothyroxine  . Mitral regurgitation 06/01/2015   Mild  . Sinus drainage 02/13/2011   uses Zyrtec as needed  . Sleep apnea 02/13/2011   Hx. sleep apnea-uses cpap since 10'12 nightly    Past Surgical History:  Procedure Laterality  Date  . ABDOMINAL HYSTERECTOMY  02/13/2011  . APPENDECTOMY  02/13/2011   Lap. removal '92  . BREAST CYST EXCISION Left   . CERVICAL FUSION  02/13/2011   '92- retained hardware  . CHOLECYSTECTOMY  02/13/2011   '98-lap. galbladder removal due to stones  . GUM SURGERY  02/13/2011   "small mouth"  . JOINT REPLACEMENT  02/13/2011   RTHA-a yr ago in [place]  . MASS EXCISION Left 08/15/2019   Procedure: EXCISION OF SEBACEOUS CYST LEFT BREAST;  Surgeon: Georganna Skeans, MD;  Location: [facility];  Service: General;  Laterality: Left;  . TONSILLECTOMY  02/13/2011   child  . TOTAL HIP ARTHROPLASTY  02/17/2011   Procedure: TOTAL HIP ARTHROPLASTY ANTERIOR APPROACH;  Surgeon: Mauri Pole, MD;  Location: [facility];  Service: Orthopedics;  Laterality: Left;  . TUBAL LIGATION  02/13/2011    Social History:  reports that she has never smoked. She has never used smokeless tobacco. She reports current alcohol use. She reports that she does not use drugs.  Allergies  Allergen Reactions  . Ceftin [Cefuroxime Axetil] Hives and Swelling    Swelling of face  . Codeine Nausea Only  . Compazine [Prochlorperazine] Swelling and Other (See Comments)    Face and tongue swelling    Family History  Problem Relation Age of Onset  . Cancer Mother        lung  . Breast cancer Maternal Aunt   . Alzheimer's disease Neg Hx   . Dementia Neg Hx      Prior to Admission medications   Medication Sig Start Date End Date Taking? Authorizing Provider  BIOTIN PO Take 5,000 mcg by mouth.    [provider]  CALCIUM-VITAMIN D PO Take 1 capsule by mouth daily. 1200-1000 calcium-vitamin d    [provider]  fluticasone (FLONASE) 50 MCG/ACT nasal spray as needed.    [provider]  hydrochlorothiazide (HYDRODIURIL) 25 MG tablet Take 25 mg by mouth daily.    [provider]  hydrOXYzine (ATARAX) 25 MG tablet Take 1 tablet (25 mg total) by mouth every 6 (six) hours  as needed for anxiety. 07/17/21   Gareth Morgan, MD  hydrOXYzine (ATARAX) 25 MG tablet Take 1 tablet (25 mg total) by mouth 3 (three) times daily as needed. 09/30/21   Tegeler, Gwenyth Allegra, MD  levothyroxine (SYNTHROID, LEVOTHROID) 137 MCG tablet Take 137 mcg by mouth daily before breakfast.    [provider]  LORazepam (ATIVAN) 1 MG tablet Take 1 mg by mouth 2 (two) times daily. 09/17/21   [provider]  losartan (COZAAR) 50 MG tablet Take 50 mg by mouth daily.    [provider]  Magnesium 100 MG CAPS Take 100 mg by mouth daily.    [provider]  metFORMIN (GLUCOPHAGE-XR) 500 MG 24 hr tablet Take 500 mg by mouth daily with breakfast.    [provider]  metoprolol tartrate (LOPRESSOR) 25 MG tablet TAKE 1/2 TO 1 TABLET IN THE MORNING AND AT BEDTIME FOR PALPITATIONS. NEED APPOINTMENT FOR FURTHER REFILLS 03/03/21   Skeet Latch, MD  OLANZapine [facility] [facility]) 2.5 MG tablet Take one tablet  2.5 mg for 7 days, then take two tablets 5 mg at bedtime daily. 10/09/21   Elwanda Brooklyn, NP  ondansetron (ZOFRAN) 4 MG tablet Take 4 mg by mouth every 8 (eight) hours as needed. 09/23/21   [provider]  prednisoLONE acetate (PRED FORTE) 1 % ophthalmic suspension prednisolone acetate 1 % eye drops,suspension    [provider]  rosuvastatin (CRESTOR) 40 MG tablet Take 1 tablet (40 mg total) by mouth daily. 08/18/17   Skeet Latch, MD  rosuvastatin (CRESTOR) 40 MG tablet Take 1 tablet by mouth daily.    [provider]  sertraline (ZOLOFT) 50 MG tablet Take 1 tablet (50 mg total) by mouth daily. Take 1/2 tablet 25 mg for 7 days, then take one whole tablet 50 mg total daily after breakfast 10/09/21   Elwanda Brooklyn, NP  Vitamin D, Ergocalciferol, (DRISDOL) 1.25 MG (50000 UNIT) CAPS capsule Take 50,000 Units by mouth once a week. 07/01/21   [provider]    Physical Exam: Vitals:   10/27/21 2153 10/27/21 2234 10/27/21 2330  10/27/21 2349  BP:  (!) 93/40 (!) 102/55 113/61  Pulse:  71 68 72  Resp:   15 19  Temp: 97.6 F (36.4 C)     TempSrc: Oral     SpO2:  96% 97% 98%  Weight:      Height:       Constitutional: Sitting up on the side of the bed, NAD, calm, comfortable Eyes: EOMI, lids and conjunctivae normal ENMT: Mucous membranes are dry. Posterior pharynx clear of any exudate or lesions.Normal dentition.  Neck: normal, supple, no masses. Respiratory: clear to auscultation bilaterally, no wheezing, no crackles. Normal respiratory effort. No accessory muscle use.  Cardiovascular: Regular rate and rhythm, no murmurs / rubs / gallops. No extremity edema.  Abdomen: no tenderness, no masses palpated.  Musculoskeletal: no clubbing / cyanosis. No joint deformity upper and lower extremities. Good ROM, no contractures. Normal muscle tone.  Skin: no rashes, lesions, ulcers. No induration Neurologic: Sensation intact. Strength 5/5 in all 4.  Psychiatric: Alert and oriented x 3.  EKG: Personally reviewed. Sinus rhythm, LAE, RBBB.  RBBB is new when compared to prior.  Assessment/Plan Principal Problem:   Hypotension Active Problems:   AKI (acute kidney injury) ([place])   Closed compression fracture of L3 lumbar vertebra, initial encounter (HCC)   OSA (obstructive sleep apnea)   Hyperlipidemia LDL goal <70   Hypothyroidism   Depression with anxiety   Danielle [name] is a 76 y.o. female with medical history significant for T2DM, HTN, HLD, hypothyroidism, depression/anxiety, OSA on CPAP, memory loss who is admitted with hypovolemic hypotension with AKI and acute L3 compression fracture. *** Assessment and Plan: No notes have been filed under this hospital service. Service: Hospitalist DVT prophylaxis: heparin injection 5,000 Units Start: 10/28/21 0600 Code Status: Full code Family Communication: Discussed with patient, she has discussed with family Disposition Plan: From home, dispo pending clinical  progress Consults called: None Severity of Illness: The appropriate patient status for this patient is OBSERVATION. Observation status is judged to be reasonable and necessary in order to provide the required intensity of service to ensure the patient's safety. The patient's presenting symptoms, physical exam findings, and initial radiographic and laboratory data in the context of their medical condition is felt to place them at decreased risk for further clinical deterioration. Furthermore, it is anticipated that the patient will be medically stable for discharge from the hospital within 2 midnights of  admission.   Zada Finders MD [facility]  If 7PM-7AM, please contact night-coverage [email or web address]  10/28/2021, 12:02 AM

## 2021-10-28 NOTE — Assessment & Plan Note (Signed)
-   Urinalysis concerning for UTI. -Urine cultures pending. -Place on IV doxycycline secondary to allergy profile.

## 2021-10-29 ENCOUNTER — Encounter (HOSPITAL_COMMUNITY): Payer: Self-pay | Admitting: Internal Medicine

## 2021-10-29 DIAGNOSIS — R443 Hallucinations, unspecified: Secondary | ICD-10-CM | POA: Diagnosis not present

## 2021-10-29 DIAGNOSIS — E861 Hypovolemia: Secondary | ICD-10-CM | POA: Diagnosis not present

## 2021-10-29 DIAGNOSIS — I9589 Other hypotension: Secondary | ICD-10-CM | POA: Diagnosis not present

## 2021-10-29 LAB — CBC WITH DIFFERENTIAL/PLATELET
Abs Immature Granulocytes: 0.05 10*3/uL (ref 0.00–0.07)
Basophils Absolute: 0 10*3/uL (ref 0.0–0.1)
Basophils Relative: 0 %
Eosinophils Absolute: 0.7 10*3/uL — ABNORMAL HIGH (ref 0.0–0.5)
Eosinophils Relative: 9 %
HCT: 39.6 % (ref 36.0–46.0)
Hemoglobin: 13.3 g/dL (ref 12.0–15.0)
Immature Granulocytes: 1 %
Lymphocytes Relative: 21 %
Lymphs Abs: 1.7 10*3/uL (ref 0.7–4.0)
MCH: 29.6 pg (ref 26.0–34.0)
MCHC: 33.6 g/dL (ref 30.0–36.0)
MCV: 88.2 fL (ref 80.0–100.0)
Monocytes Absolute: 0.8 10*3/uL (ref 0.1–1.0)
Monocytes Relative: 10 %
Neutro Abs: 4.7 10*3/uL (ref 1.7–7.7)
Neutrophils Relative %: 59 %
Platelets: 204 10*3/uL (ref 150–400)
RBC: 4.49 MIL/uL (ref 3.87–5.11)
RDW: 13 % (ref 11.5–15.5)
WBC: 7.9 10*3/uL (ref 4.0–10.5)
nRBC: 0 % (ref 0.0–0.2)

## 2021-10-29 LAB — BASIC METABOLIC PANEL
Anion gap: 6 (ref 5–15)
BUN: 18 mg/dL (ref 8–23)
CO2: 23 mmol/L (ref 22–32)
Calcium: 8.7 mg/dL — ABNORMAL LOW (ref 8.9–10.3)
Chloride: 113 mmol/L — ABNORMAL HIGH (ref 98–111)
Creatinine, Ser: 1.5 mg/dL — ABNORMAL HIGH (ref 0.44–1.00)
GFR, Estimated: 36 mL/min — ABNORMAL LOW (ref >60)
Glucose, Bld: 119 mg/dL — ABNORMAL HIGH (ref 70–99)
Potassium: 2.9 mmol/L — ABNORMAL LOW (ref 3.5–5.1)
Sodium: 142 mmol/L (ref 135–145)

## 2021-10-29 LAB — GLUCOSE, CAPILLARY
Glucose-Capillary: 107 mg/dL — ABNORMAL HIGH (ref 70–99)
Glucose-Capillary: 100 mg/dL — ABNORMAL HIGH (ref 70–99)
Glucose-Capillary: 109 mg/dL — ABNORMAL HIGH (ref 70–99)
Glucose-Capillary: 109 mg/dL — ABNORMAL HIGH (ref 70–99)

## 2021-10-29 LAB — VITAMIN B12: Vitamin B-12: 304 pg/mL (ref 180–914)

## 2021-10-29 LAB — TSH: TSH: 6.798 u[IU]/mL — ABNORMAL HIGH (ref 0.350–4.500)

## 2021-10-29 LAB — URINE CULTURE

## 2021-10-29 LAB — FOLATE: Folate: 8.6 ng/mL (ref >5.9)

## 2021-10-29 LAB — MAGNESIUM: Magnesium: 1.9 mg/dL (ref 1.7–2.4)

## 2021-10-29 MED ORDER — SULFAMETHOXAZOLE-TRIMETHOPRIM 800-160 MG PO TABS
1.0000 | ORAL_TABLET | Freq: Two times a day (BID) | ORAL | Status: DC
  Administered 2021-10-29 – 2021-10-31 (×5): 1 via ORAL
  Filled 2021-10-29 (×5): qty 1

## 2021-10-29 MED ORDER — OLANZAPINE 5 MG PO TABS
5.0000 mg | ORAL_TABLET | Freq: Every day | ORAL | Status: DC
  Administered 2021-10-29 – 2021-10-30 (×2): 5 mg via ORAL
  Filled 2021-10-29 (×2): qty 1

## 2021-10-29 MED ORDER — ENOXAPARIN SODIUM 40 MG/0.4ML IJ SOSY
40.0000 mg | PREFILLED_SYRINGE | INTRAMUSCULAR | Status: DC
  Administered 2021-10-29 – 2021-10-30 (×2): 40 mg via SUBCUTANEOUS
  Filled 2021-10-29 (×2): qty 0.4

## 2021-10-29 MED ORDER — LORAZEPAM 2 MG/ML IJ SOLN
0.5000 mg | Freq: Once | INTRAMUSCULAR | Status: AC
  Administered 2021-10-29: 0.5 mg via INTRAVENOUS
  Filled 2021-10-29: qty 1

## 2021-10-29 MED ORDER — POTASSIUM CHLORIDE 20 MEQ PO PACK
40.0000 meq | PACK | ORAL | Status: AC
  Administered 2021-10-29 (×2): 40 meq via ORAL
  Filled 2021-10-29 (×2): qty 2

## 2021-10-29 NOTE — Progress Notes (Signed)
Pt continues to refuse CPAP qhs.  Pt encouraged to contact RT should she change her mind. 

## 2021-10-29 NOTE — Progress Notes (Addendum)
PROGRESS NOTE    Danielle [name]  GEZ:[id number] DOB: 1945-08-24 DOA: 10/27/2021 PCP: [name], MD  [id number]/1435-01  LOS: 1 day   Brief hospital course:   Assessment & Plan: Danielle [name] is a 76 y.o. female with medical history significant for T2DM, HTN, HLD, hypothyroidism, depression/anxiety, OSA on CPAP, memory loss who presented to the ED for evaluation after multiple falls at home.  Admitted with hypovolemic hypotension with AKI and acute L3 compression fracture.    * Hypotension, resolved Likely hypovolemic hypotension related to poor oral intake and recent loose stools plus medication effect.  S/p IVF Plan: --hold home BP meds for now, resume gradually as BP trends up  AKI (acute kidney injury) ([place]) Creatinine 2.66 on admission compared to baseline around 0.9.  Suspect prerenal from hypovolemia.  CT A/P with normal-appearing kidneys, no renal calculi, focal lesion, or hydronephrosis. --Cr improved with IVF Plan: -Continue to hold losartan, HCTZ --oral hydration now   Frequent falls Closed compression fracture of L3 lumbar vertebra, initial encounter (HCC) Acute compression fracture of the superior endplate of L3 with 2-3 mm height loss, no retropulsion seen on CT.  Suspect due to one of her falls prior to arrival.  EDP spoke with on-call neurosurgery who felt this was mild and recommended conservative management.  Not having much pain on admission. --PT/OT, and SNF rehab   Hallucinations - Per family patient has been having visual hallucinations that have been worsening. -Patient noted with depression and anxiety, and has been seeing outpatient psych. -urinalysis concerning for UTI which may be etiology of patient's hallucinations, however urine with multiple species, suggesting chronic colonization.   Plan: --increase Zyprexa from 2.5 mg to 5 mg today (supposed to be titrated up, per outpatient Rx instruction) --cont Lamictal --f/u with outpatient  psych  Delirium and sundowning --pt was having confusion and hallucinations most at night time even before presentation.  Consistent with delirium, however, no clear medical trigger.  Pt was taking scheduled ativan which may worsening delirium.  Husband said dementia has been ruled out. Plan: --increase Zyprexa from 2.5 mg to 5 mg today (supposed to be titrated up, per outpatient Rx instruction) --d/c scheduled ativan BID as that can worsen delirium   Depression with anxiety She has had some recent adjustments in her medications by behavioral health. -Family stating patient has been having visual hallucinations and states patient does not have any history of dementia. Plan: --cont Zoloft --d/c scheduled ativan BID as that can worsen delirium  --f/u with outpatient psych   Questionable UTI  - Urinalysis concerning for UTI, started on IV doxycycline on presentation due to allergy, however, doxycycline does not concentrate in urine. --urine cx with multiple species, suggesting chronic colonization.  However, pt did have minor urinary complaints. Plan: --start empiric Bactrim for a 3-day course   Hypothyroidism Synthroid.    Hyperlipidemia LDL goal <70 Continue rosuvastatin.   OSA (obstructive sleep apnea) Continue CPAP nightly.  Obesity, BMI:  37.14   DVT prophylaxis: Lovenox SQ Code Status: Full code  Family Communication: husband updated at bedside today Level of care: Med-Surg Dispo:   The patient is from: home Anticipated d/c is to: SNF rehab Anticipated d/c date is: whenever bed available   Subjective and Interval History:  Per RN, pt was agitated last night.  Pt was calm and coherent during the day time.  Admitted to feeling sad (dog recently died), very depressed and very anxious and scared.    Admitted to mild dysuria when  asked.   Objective: Vitals:   10/29/21 0245 10/29/21 0350 10/29/21 0600 10/29/21 1242  BP: (!) 148/65  132/65 (!) 146/87  Pulse: 77   76   Resp: 20   19  Temp: 97.7 F (36.5 C)  98 F (36.7 C) 98.2 F (36.8 C)  TempSrc: Oral  Oral Oral  SpO2: 95%  96% 99%  Weight:  92.1 kg    Height:        Intake/Output Summary (Last 24 hours) at 10/29/2021 1728 Last data filed at 10/29/2021 1100 Gross per 24 hour  Intake 2828.9 ml  Output 1225 ml  Net 1603.9 ml   Filed Weights   10/27/21 1715 10/29/21 0350  Weight: 93.3 kg 92.1 kg    Examination:   Constitutional: NAD, AAOx3 HEENT: conjunctivae and lids normal, EOMI CV: No cyanosis.   RESP: normal respiratory effort, on RA Neuro: II - XII grossly intact.   Psych: depressed mood and affect.     Data Reviewed: I have personally reviewed labs and imaging studies  Time spent: 50 minutes  Enzo Bi, MD [facility] If 7PM-7AM, please contact night-coverage 10/29/2021, 5:28 PM

## 2021-10-29 NOTE — TOC Initial Note (Signed)
Transition of Care [facility]) - Initial/Assessment Note    Patient Details  Name: Danielle [name] MRN: [id number] Date of Birth: 1945/10/22  [facility] CM/SW Contact:    Roseanne Kaufman, RN Phone Number: 10/29/2021, 4:24 PM  Clinical Narrative:       PT recommended short term SNF, spoke with patient's husband who does not have a preference in SNF.  TOC will continue follow for SNF placement.            Expected Discharge Plan: Skilled Nursing Facility Barriers to Discharge: Continued Medical Work up   Patient Goals and CMS Choice Patient states their goals for this hospitalization and ongoing recovery are:: receive rehab at [facility] CMS Medicare.gov Compare Post Acute Care list provided to:: Patient Represenative (must comment) [name] (husband)) Choice offered to / list presented to : Spouse  Expected Discharge Plan and Services Expected Discharge Plan: [place] In-house Referral: NA Discharge Planning Services: CM Consult Post Acute Care Choice: [place] Living arrangements for the past 2 months: Single Family Home                 DME Arranged: N/A DME Agency: NA       HH Arranged: NA [place] Agency: NA        Prior Living Arrangements/Services Living arrangements for the past 2 months: Single Family Home Lives with:: Spouse   Do you feel safe going back to the place where you live?: Yes      Need for Family Participation in Patient Care: Yes (Comment) Care giver support system in place?: Yes (comment)   Criminal Activity/Legal Involvement Pertinent to Current Situation/Hospitalization: No - Comment as needed  Activities of Daily Living Home Assistive Devices/Equipment: Cane (specify quad or straight), Eyeglasses ADL Screening (condition at time of admission) Patient's cognitive ability adequate to safely complete daily activities?: No Is the patient deaf or have difficulty hearing?: No Does the patient have difficulty  seeing, even when wearing glasses/contacts?: No Does the patient have difficulty concentrating, remembering, or making decisions?: Yes Patient able to express need for assistance with ADLs?: Yes Does the patient have difficulty dressing or bathing?: No Independently performs ADLs?: Yes (appropriate for developmental age) Does the patient have difficulty walking or climbing stairs?: Yes Weakness of Legs: Both Weakness of Arms/Hands: None  Permission Sought/Granted Permission sought to share information with : Case Manager Permission granted to share information with : Yes, Verbal Permission Granted  Share Information with NAME: Case manager           Emotional Assessment Appearance:: Appears stated age Attitude/Demeanor/Rapport: Unable to Assess Affect (typically observed): Unable to Assess Orientation: :  (unable to assess, patietn asleep) Alcohol / Substance Use: Not Applicable Psych Involvement: No (comment)  Admission diagnosis:  AKI (acute kidney injury) ([place]) [N17.9] Hypotension [I95.9] Fall, initial encounter [W19.XXXA] Hypotension due to hypovolemia [I95.89, E86.1] Compression fracture of L3 vertebra, initial encounter ([place]) [S32.030A] Dementia, unspecified dementia severity, unspecified dementia type, unspecified whether behavioral, psychotic, or mood disturbance or anxiety ([place]) [F03.90] Patient Active Problem List   Diagnosis Date Noted   UTI (urinary tract infection) 10/28/2021   Hallucinations 10/28/2021   Urinary tract infection without hematuria    Hypotension 10/27/2021   AKI (acute kidney injury) ([place]) 10/27/2021   Closed compression fracture of L3 lumbar vertebra, initial encounter ([place]) 10/27/2021   Generalized anxiety disorder 10/16/2021   Subjective memory complaints 10/16/2021   Memory loss 12/22/2020   Alteration of awareness 12/22/2020  Palpitations 08/17/2019   Calcification of native coronary artery 10/20/2016   Morbid obesity ([place])    Chest  pain 10/07/2016   Hyperglycemia 10/07/2016   Acute respiratory failure with hypoxia ([place]) 05/30/2015   Atypical pneumonia 05/30/2015   Dyspnea 05/30/2015   Hyperlipidemia LDL goal <70    Gastroesophageal reflux disease without esophagitis    Depression with anxiety    Tibial plateau fracture 05/16/2015   Closed tibial fracture 05/16/2015   Sebaceous cyst of breast 07/27/2013   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   OSA (obstructive sleep apnea) 02/13/2011   Hypertension 02/13/2011   GERD (gastroesophageal reflux disease) 02/13/2011   Hypothyroidism 02/13/2011   PCP:  [name], MD Pharmacy:   [id number] [place] [address] [address] [address]Phone: [phone]: [phone]    Social Determinants of Health (SDOH) Interventions    Readmission Risk Interventions     No data to display

## 2021-10-30 DIAGNOSIS — I959 Hypotension, unspecified: Secondary | ICD-10-CM

## 2021-10-30 LAB — CBC
HCT: 37.5 % (ref 36.0–46.0)
Hemoglobin: 12.8 g/dL (ref 12.0–15.0)
MCH: 30.3 pg (ref 26.0–34.0)
MCHC: 34.1 g/dL (ref 30.0–36.0)
MCV: 88.9 fL (ref 80.0–100.0)
Platelets: 184 10*3/uL (ref 150–400)
RBC: 4.22 MIL/uL (ref 3.87–5.11)
RDW: 13.2 % (ref 11.5–15.5)
WBC: 8.1 10*3/uL (ref 4.0–10.5)
nRBC: 0 % (ref 0.0–0.2)

## 2021-10-30 LAB — HIV ANTIBODY (ROUTINE TESTING W REFLEX): HIV Screen 4th Generation wRfx: NONREACTIVE

## 2021-10-30 LAB — GLUCOSE, CAPILLARY
Glucose-Capillary: 130 mg/dL — ABNORMAL HIGH (ref 70–99)
Glucose-Capillary: 98 mg/dL (ref 70–99)
Glucose-Capillary: 131 mg/dL — ABNORMAL HIGH (ref 70–99)
Glucose-Capillary: 132 mg/dL — ABNORMAL HIGH (ref 70–99)

## 2021-10-30 LAB — BASIC METABOLIC PANEL
Anion gap: 5 (ref 5–15)
BUN: 15 mg/dL (ref 8–23)
CO2: 22 mmol/L (ref 22–32)
Calcium: 8.8 mg/dL — ABNORMAL LOW (ref 8.9–10.3)
Chloride: 113 mmol/L — ABNORMAL HIGH (ref 98–111)
Creatinine, Ser: 1.35 mg/dL — ABNORMAL HIGH (ref 0.44–1.00)
GFR, Estimated: 41 mL/min — ABNORMAL LOW (ref >60)
Glucose, Bld: 90 mg/dL (ref 70–99)
Potassium: 3.3 mmol/L — ABNORMAL LOW (ref 3.5–5.1)
Sodium: 140 mmol/L (ref 135–145)

## 2021-10-30 LAB — RPR: RPR Ser Ql: NONREACTIVE

## 2021-10-30 LAB — MAGNESIUM: Magnesium: 1.8 mg/dL (ref 1.7–2.4)

## 2021-10-30 MED ORDER — MELATONIN 3 MG PO TABS
3.0000 mg | ORAL_TABLET | Freq: Once | ORAL | Status: AC
  Administered 2021-10-30: 3 mg via ORAL
  Filled 2021-10-30: qty 1

## 2021-10-30 MED ORDER — POTASSIUM CHLORIDE CRYS ER 20 MEQ PO TBCR
40.0000 meq | EXTENDED_RELEASE_TABLET | Freq: Once | ORAL | Status: AC
  Administered 2021-10-30: 40 meq via ORAL
  Filled 2021-10-30: qty 2

## 2021-10-30 NOTE — Progress Notes (Signed)
Occupational Therapy Treatment Patient Details Name: Danielle [name] MRN: [id number] DOB: 10/07/1945 Today's Date: 10/30/2021   History of present illness Pt is as 76 y/o F admitted on 10/27/21 after presenting to the ED with c/o 2 falls that day. Pt with c/o neck pain & pain along L side of body. Imaging revealed L3 compression fx but neurosurgery not recommending sx at this time. Based on labs, pt felt to be in AKI. PMH:  possible dementia, HTN, high cholesterol, mitral regurgitation, CAD, DM   OT comments  Pt was pleasant & motivated to participate in the session. She required min guard assist to stand, min assist for toileting at bedside commode level, and min guard assist for grooming in standing at the sink. She presented with unsteadiness in standing, making her a current falls risk. She was also noted to be with occasional slight confusion, intermittent memory lapses, and some difficulty problem solving more complex multi-step tasks. She will continue to benefit from OT services to maximize her safety and independence with self-care tasks.    Recommendations for follow up therapy are one component of a multi-disciplinary discharge planning process, led by the attending physician.  Recommendations may be updated based on patient status, additional functional criteria and insurance authorization.    Follow Up Recommendations  Skilled nursing-short term rehab (<3 hours/day)    Assistance Recommended at Discharge Intermittent Supervision/Assistance  Patient can return home with the following  A little help with walking and/or transfers;A little help with bathing/dressing/bathroom;Assistance with cooking/housework;Direct supervision/assist for financial management;Assist for transportation;Direct supervision/assist for medications management   Equipment Recommendations  None recommended by OT       Precautions / Restrictions Precautions Precautions: Fall;Back Precaution Booklet  Issued: No Restrictions Weight Bearing Restrictions: No Other Position/Activity Restrictions: acute L3 compression fracture       Mobility Bed Mobility               General bed mobility comments: pt received seated on bedside commode    Transfers Overall transfer level: Needs assistance   Transfers: Sit to/from Stand, Bed to chair/wheelchair/BSC Sit to Stand: Min guard            Balance Overall balance assessment: Needs assistance, History of Falls   Sitting balance-Leahy Scale: Good         Standing balance comment: min guard             ADL either performed or assessed with clinical judgement   ADL   Eating/Feeding: Independent   Grooming: Min guard;Standing Grooming Details (indicate cue type and reason): She performed face washing, teeth brushing, and hair combing in standing at the sink.         Upper Body Dressing : Set up;Supervision/safety Upper Body Dressing Details (indicate cue type and reason): based on clinical judgement     Toilet Transfer: Min guard;BSC/3in1;Cueing for safety   Toileting- Clothing Manipulation and Hygiene: Minimal assistance;Sit to/from stand Toileting - Clothing Manipulation Details (indicate cue type and reason): Required occasional steadying assist in standing.                       Cognition Arousal/Alertness: Awake/alert     Area of Impairment: Memory, Awareness        Following Commands: Follows one step commands consistently       General Comments: Patient noted to have some slight confusion at times  Pertinent Vitals/ Pain       Pain Assessment Pain Assessment: No/denies pain         Frequency  Min 2X/week        Progress Toward Goals  OT Goals(current goals can now be found in the care plan section)  Progress towards OT goals: Progressing toward goals  Acute Rehab OT Goals Patient Stated Goal: To get better OT Goal Formulation: With patient Time  For Goal Achievement: 11/11/21 Potential to Achieve Goals: Good  Plan Discharge plan remains appropriate       AM-PAC OT "6 Clicks" Daily Activity     Outcome Measure   Help from another person eating meals?: None Help from another person taking care of personal grooming?: A Little Help from another person toileting, which includes using toliet, bedpan, or urinal?: A Little Help from another person bathing (including washing, rinsing, drying)?: A Lot Help from another person to put on and taking off regular upper body clothing?: None Help from another person to put on and taking off regular lower body clothing?: A Little 6 Click Score: 19    End of Session    OT Visit Diagnosis: Unsteadiness on feet (R26.81);Repeated falls (R29.6)   Activity Tolerance Patient tolerated treatment well   Patient Left in chair;with call bell/phone within reach;with chair alarm set   Nurse Communication Mobility status        Time: 5732-2025 OT Time Calculation (min): 24 min  Charges: OT General Charges $OT Visit: 1 Visit OT Treatments $Self Care/Home Management : 8-22 mins $Therapeutic Activity: 8-22 mins    Leota Sauers, OTR/L 10/30/2021, 10:28 AM

## 2021-10-30 NOTE — Progress Notes (Signed)
PROGRESS NOTE  Danielle [name]  VFI:[id number] DOB: [birth year] DOA: 10/27/2021 PCP: [name], MD   Brief Narrative:  Patient is a 76 year old female with history of type 2 diabetes, hypertension, hyperlipidemia, hypothyroidism, depression/anxiety, OSA on CPAP, memory loss who presented to the emergency with complaint of multiple falls at home.  On presentation she was hypertensive, lab work showed AKI with creatinine of 2.6.  Imaging showed closed compression fracture of L3 lumbar vertebra.  Hospital course also remarkable for delirium, hallucinations.  PT/OT recommending SNF on discharge.  Medically stable for discharge whenever possible.TOC following  Assessment & Plan:  Principal Problem:   Hypotension Active Problems:   AKI (acute kidney injury) ([place])   Closed compression fracture of L3 lumbar vertebra, initial encounter (HCC)   OSA (obstructive sleep apnea)   Hyperlipidemia LDL goal <70   Hypothyroidism   Depression with anxiety   Urinary tract infection without hematuria   UTI (urinary tract infection)   Hallucinations  Frequent falls: Lives at home.  Report of frequent falls.  Imaging showed closed compression fracture of L3 lumbar vertebra, 2 to 3 mm height loss, no retropulsion.  Was discussed with neurosurgery, recommended conservative management.  Pain well controlled.  PT/OT recommended SNF on discharge.  TOC following.  PASARR pending  Hypotension: Initially hypotensive on presentation.  Given IV fluids.  Blood pressure has been stable now.  Home antihypertensive medications on hold.  Blood pressure on the higher side.  If she remains hypertensive, will start on amlodipine.  AKI: Creatinine of 2.66 on presentation.  Baseline creatinine normal.  Gradually improving, now close to baseline.  Thought to be secondary to prerenal from hypovolemia.  CT abdomen/pelvis showed normal-appearing kidneys.  Losartan, hydrochlorothiazide on hold  Hallucinations/delirium:  Some report of memory loss at baseline.  No clear documented history of dementia.  Hospital course remarkable for hallucination, confusion, sundowning.  Continue supportive care, delirium precautions.  Currently on Zyprexa 5 mg daily.  Ativan has been discontinued that she was taking before. This morning she remains alert and orientedX3  Suspected UTI: Urinalysis was concerning for UTI.  Urine culture not sent.  Currently on Bactrim, plan for 3 days course  Depression/anxiety: On Zoloft, Lamictal.  Follows with outpatient psychiatry.  Ativan discontinued  Hypothyroidism: Continue Synthyroid  Hyperlipidemia: Continue statin  OSA: Continue CPAP nightly  Hypokalemia: Supplemented with potassium  Obesity: BMI of 36.4        DVT prophylaxis:enoxaparin (LOVENOX) injection 40 mg Start: 10/29/21 2200     Code Status: Full Code  Family Communication: None at bedside  Patient status:Inpatient  Patient is from :Home  Anticipated discharge to:SNF  Estimated DC date:Not sure, medically stable for discharge, placement pending.   Consultants: None  Procedures: None  Antimicrobials:  Anti-infectives (From admission, onward)    Start     Dose/Rate Route Frequency Ordered Stop   10/29/21 1200  sulfamethoxazole-trimethoprim (BACTRIM DS) 800-160 MG per tablet 1 tablet        1 tablet Oral Every 12 hours 10/29/21 1101 11/01/21 0959   10/28/21 1000  doxycycline (VIBRAMYCIN) 100 mg in sodium chloride 0.9 % 250 mL IVPB  Status:  Discontinued        100 mg 125 mL/hr over 120 Minutes Intravenous Every 12 hours 10/28/21 0857 10/29/21 1013       Subjective: Patient seen and examined at bedside.  She was trying to brush her teeth.  She was standing near the basin. She looks very comfortable, not in any distress.  Denies any complaints.  Alert and oriented  Objective: Vitals:   10/29/21 2023 10/30/21 0420 10/30/21 0500 10/30/21 0545  BP: (!) 124/55 (!) 140/57  (!) 150/79  Pulse: 80 94   89  Resp: '20 17  20  '$ Temp: 98.4 F (36.9 C) 98.4 F (36.9 C)  98.5 F (36.9 C)  TempSrc: Oral   Oral  SpO2: 96% 95%  96%  Weight:   90.5 kg   Height:        Intake/Output Summary (Last 24 hours) at 10/30/2021 0946 Last data filed at 10/30/2021 0845 Gross per 24 hour  Intake 1035.35 ml  Output 1850 ml  Net -814.65 ml   Filed Weights   10/27/21 1715 10/29/21 0350 10/30/21 0500  Weight: 93.3 kg 92.1 kg 90.5 kg    Examination:  General exam: Overall comfortable, not in distress,obese HEENT: PERRL Respiratory system:  no wheezes or crackles  Cardiovascular system: S1 & S2 heard, RRR.  Gastrointestinal system: Abdomen is nondistended, soft and nontender. Central nervous system: Alert and oriented Extremities: No edema, no clubbing ,no cyanosis Skin: No rashes, no ulcers,no icterus     Data Reviewed: I have personally reviewed following labs and imaging studies  CBC: Recent Labs  Lab 10/27/21 1922 10/28/21 0540 10/29/21 0426 10/30/21 0409  WBC 14.3* 9.9 7.9 8.1  NEUTROABS 10.3*  --  4.7  --   HGB 14.0 12.9 13.3 12.8  HCT 41.1 39.0 39.6 37.5  MCV 87.4 89.9 88.2 88.9  PLT 252 229 204 785   Basic Metabolic Panel: Recent Labs  Lab 10/27/21 1922 10/28/21 0540 10/29/21 0426 10/30/21 0409  NA 138 140 142 140  K 3.5 3.5 2.9* 3.3*  CL 102 110 113* 113*  CO2 '26 25 23 22  '$ GLUCOSE 158* 91 119* 90  BUN 36* 30* 18 15  CREATININE 2.66* 2.25* 1.50* 1.35*  CALCIUM 9.1 8.5* 8.7* 8.8*  MG  --   --  1.9 1.8     Recent Results (from the past 240 hour(s))  Culture, blood (Routine X 2) w Reflex to ID Panel     Status: None (Preliminary result)   Collection Time: 10/28/21 12:00 AM   Specimen: BLOOD  Result Value Ref Range Status   Specimen Description   Final    BLOOD BLOOD RIGHT WRIST Performed at [facility] [address] [place], [place] [address]    Special Requests   Final    BOTTLES DRAWN AEROBIC AND ANAEROBIC Blood Culture results may  not be optimal due to an inadequate volume of blood received in culture bottles Performed at [facility] [address] [place], [place] [address]    Culture   Final    NO GROWTH 2 DAYS Performed at [facility] [address], [place], [place] [address]    Report Status PENDING  Incomplete  Culture, blood (Routine X 2) w Reflex to ID Panel     Status: None (Preliminary result)   Collection Time: 10/28/21 12:00 AM   Specimen: BLOOD  Result Value Ref Range Status   Specimen Description   Final    BLOOD LEFT ANTECUBITAL Performed at [facility] [address], [place], [place] [address]    Special Requests   Final    BOTTLES DRAWN AEROBIC AND ANAEROBIC Blood Culture adequate volume Performed at [facility] [address], [place],  [address]    Culture   Final    NO GROWTH  2 DAYS Performed at [facility], [place] [address] [place], [place] [address]    Report Status PENDING  Incomplete  Urine Culture     Status: Abnormal   Collection Time: 10/28/21  1:45 AM   Specimen: Urine, Catheterized  Result Value Ref Range Status   Specimen Description   Final    URINE, CATHETERIZED Performed at [facility] [address] [place], [place] [address]    Special Requests   Final    NONE Performed at [facility], [place] [address] [place], [place] [address]    Culture MULTIPLE SPECIES PRESENT, SUGGEST RECOLLECTION (A)  Final   Report Status 10/29/2021 FINAL  Final     Radiology Studies: No results found.  Scheduled Meds:  enoxaparin (LOVENOX) injection  40 mg Subcutaneous Q24H   fluticasone  2 spray Each Nare Daily   insulin aspart  0-9 Units Subcutaneous TID WC   lamoTRIgine  200 mg Oral Daily   levothyroxine  137 mcg Oral Q0600   OLANZapine  5 mg Oral QHS   potassium chloride  40 mEq Oral Once   rosuvastatin  40 mg Oral Daily   sertraline  50 mg Oral Daily    sulfamethoxazole-trimethoprim  1 tablet Oral Q12H   Continuous Infusions:   LOS: 2 days   Shelly Coss, MD [facility] P10/19/2023, 9:46 AM

## 2021-10-30 NOTE — TOC Progression Note (Signed)
Transition of Care [facility]) - Progression Note    Patient Details  Name: Danielle [name] MRN: [id number] Date of Birth: 01/15/1945  Transition of Care [facility]) CM/SW [place], RN Phone Number: 10/30/2021, 4:23 PM  Clinical Narrative:   Presented short term SNF bed offers to patient's husband and left a copy of the SNF ([email or web address] list at the bedside.   TOC will continue to follow.     Expected Discharge Plan: [place] Barriers to Discharge: Continued Medical Work up  Expected Discharge Plan and Services Expected Discharge Plan: [place] In-house Referral: NA Discharge Planning Services: CM Consult Post Acute Care Choice: [place] Living arrangements for the past 2 months: Single Family Home                 DME Arranged: N/A DME Agency: NA       HH Arranged: NA HH Agency: NA         Social Determinants of Health (SDOH) Interventions    Readmission Risk Interventions     No data to display

## 2021-10-30 NOTE — NC FL2 (Signed)
[facility] LEVEL OF CARE SCREENING TOOL     IDENTIFICATION  Patient Name: Danielle [name] Birthdate: [birth year] Sex: female Admission Date (Current Location): 10/27/2021  [facility] and [place] Number:  Herbalist and Address:  [facility],  [place], [place]      Provider Number: [id number]  Attending Physician Name and Address:  Shelly Coss, MD  Relative Name and Phone Number:  Mikeisha Lemonds # [phone]    Current Level of Care: Hospital Recommended Level of Care: [facility] Prior Approval Number:    Date Approved/Denied: 10/30/21 PASRR Number: [id number]  Discharge Plan: SNF    Current Diagnoses: Patient Active Problem List   Diagnosis Date Noted   UTI (urinary tract infection) 10/28/2021   Hallucinations 10/28/2021   Urinary tract infection without hematuria    Hypotension 10/27/2021   AKI (acute kidney injury) ([place]) 10/27/2021   Closed compression fracture of L3 lumbar vertebra, initial encounter ([place]) 10/27/2021   Generalized anxiety disorder 10/16/2021   Subjective memory complaints 10/16/2021   Memory loss 12/22/2020   Alteration of awareness 12/22/2020   Palpitations 08/17/2019   Calcification of native coronary artery 10/20/2016   Morbid obesity ([place])    Chest pain 10/07/2016   Hyperglycemia 10/07/2016   Acute respiratory failure with hypoxia ([place]) 05/30/2015   Atypical pneumonia 05/30/2015   Dyspnea 05/30/2015   Hyperlipidemia LDL goal <70    Gastroesophageal reflux disease without esophagitis    Depression with anxiety    Tibial plateau fracture 05/16/2015   Closed tibial fracture 05/16/2015   Sebaceous cyst of breast 07/27/2013   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   OSA (obstructive sleep apnea) 02/13/2011   Hypertension 02/13/2011   GERD (gastroesophageal reflux disease) 02/13/2011   Hypothyroidism 02/13/2011    Orientation RESPIRATION BLADDER Height & Weight     Self,  Time, Situation, Place  Normal Continent Weight: 199 lb 8.3 oz (90.5 kg) Height:  '5\' 2"'$  (157.5 cm)  BEHAVIORAL SYMPTOMS/MOOD NEUROLOGICAL BOWEL NUTRITION STATUS      Continent Diet  AMBULATORY STATUS COMMUNICATION OF NEEDS Skin   Limited Assist Verbally Normal                       Personal Care Assistance Level of Assistance  Bathing, Feeding, Dressing Bathing Assistance: Limited assistance Feeding assistance: Limited assistance Dressing Assistance: Limited assistance     Functional Limitations Info  Sight, Hearing, Speech Sight Info: Adequate Hearing Info: Adequate Speech Info: Adequate    SPECIAL CARE FACTORS FREQUENCY                       Contractures Contractures Info: Not present    Additional Factors Info  Code Status Code Status Info: Full             Current Medications (10/30/2021):  This is the current hospital active medication list Current Facility-Administered Medications  Medication Dose Route Frequency Provider Last Rate Last Admin   acetaminophen (TYLENOL) tablet 650 mg  650 mg Oral Q6H PRN Lenore Cordia, MD   650 mg at 10/30/21 6759   Or   acetaminophen (TYLENOL) suppository 650 mg  650 mg Rectal Q6H PRN Lenore Cordia, MD       alum & mag hydroxide-simeth (MAALOX/MYLANTA) 200-200-20 MG/5ML suspension 30 mL  30 mL Oral Q6H PRN Eugenie Filler, MD       enoxaparin (LOVENOX) injection 40 mg  40 mg  Subcutaneous Q24H Enzo Bi, MD   40 mg at 10/29/21 2126   fluticasone (FLONASE) 50 MCG/ACT nasal spray 2 spray  2 spray Each Nare Daily Eugenie Filler, MD   2 spray at 10/30/21 0909   hydrOXYzine (ATARAX) tablet 25 mg  25 mg Oral Q6H PRN Eugenie Filler, MD   25 mg at 10/29/21 2126   insulin aspart (novoLOG) injection 0-9 Units  0-9 Units Subcutaneous TID WC Eugenie Filler, MD   1 Units at 10/30/21 0908   lamoTRIgine (LAMICTAL) tablet 200 mg  200 mg Oral Daily Eugenie Filler, MD   200 mg at 10/29/21 2126   levothyroxine  (SYNTHROID) tablet 137 mcg  137 mcg Oral Q0600 Lenore Cordia, MD   137 mcg at 10/30/21 0700   OLANZapine (ZYPREXA) tablet 5 mg  5 mg Oral QHS Enzo Bi, MD   5 mg at 10/29/21 2126   ondansetron (ZOFRAN) tablet 4 mg  4 mg Oral Q6H PRN Lenore Cordia, MD       Or   ondansetron (ZOFRAN) injection 4 mg  4 mg Intravenous Q6H PRN Lenore Cordia, MD       Oral care mouth rinse  15 mL Mouth Rinse PRN Eugenie Filler, MD       rosuvastatin (CRESTOR) tablet 40 mg  40 mg Oral Daily Zada Finders R, MD   40 mg at 10/30/21 0908   senna-docusate (Senokot-S) tablet 1 tablet  1 tablet Oral QHS PRN Lenore Cordia, MD       sertraline (ZOLOFT) tablet 50 mg  50 mg Oral Daily Eugenie Filler, MD   50 mg at 10/30/21 8811   sulfamethoxazole-trimethoprim (BACTRIM DS) 800-160 MG per tablet 1 tablet  1 tablet Oral Q12H Enzo Bi, MD   1 tablet at 10/30/21 0908     Discharge Medications: Please see discharge summary for a list of discharge medications.  Relevant Imaging Results:  Relevant Lab Results:   Additional Information [place] Arlyne Brandes SSI number [id number]  Rodney Booze, [place]

## 2021-10-30 NOTE — Progress Notes (Signed)
Physical Therapy Treatment Patient Details Name: Danielle [name] MRN: [id number] DOB: [birth year] Today's Date: 10/30/2021   History of Present Illness Pt is as 76 y/o F admitted on 10/27/21 after presenting to the ED with c/o 2 falls that day. Pt with c/o neck pain & pain along L side of body. Imaging revealed L3 compression fx but neurosurgery not recommending sx at this time. Based on labs, pt felt to be in AKI. PMH:  possible dementia, HTN, high cholesterol, mitral regurgitation, CAD, DM    PT Comments    Pt able to increase her ambulation distance some today, but she struggles with turning and requires A to maneuver RW. She also had difficulty with scooting back in the chair.  Con't to recommend SNF.   Recommendations for follow up therapy are one component of a multi-disciplinary discharge planning process, led by the attending physician.  Recommendations may be updated based on patient status, additional functional criteria and insurance authorization.  Follow Up Recommendations  Skilled nursing-short term rehab (<3 hours/day) Can patient physically be transported by private vehicle: Yes   Assistance Recommended at Discharge Frequent or constant Supervision/Assistance  Patient can return home with the following A little help with walking and/or transfers;A little help with bathing/dressing/bathroom;Assistance with cooking/housework;Assist for transportation;Help with stairs or ramp for entrance   Equipment Recommendations  None recommended by PT    Recommendations for Other Services       Precautions / Restrictions Precautions Precautions: Fall;Back Precaution Booklet Issued: No Restrictions Weight Bearing Restrictions: No Other Position/Activity Restrictions: acute L3 compression fracture     Mobility  Bed Mobility               General bed mobility comments: up in recliner upon arrival    Transfers Overall transfer level: Needs assistance Equipment used:  Rolling walker (2 wheels) Transfers: Sit to/from Stand Sit to Stand: Min guard           General transfer comment: heavy cueing including hand over hand placement to push up from chair and when reaching back to chair.    Ambulation/Gait Ambulation/Gait assistance: Min assist, Min guard Gait Distance (Feet): 70 Feet Assistive device: Rolling walker (2 wheels) Gait Pattern/deviations: Decreased step length - right, Decreased step length - left Gait velocity: decreased     General Gait Details: min/guard on straight path, but MIN A with turning including A with RW.   Stairs             Wheelchair Mobility    Modified Rankin (Stroke Patients Only)       Balance Overall balance assessment: Needs assistance, History of Falls         Standing balance support: During functional activity Standing balance-Leahy Scale: Fair                              Cognition Arousal/Alertness: Awake/alert Behavior During Therapy: WFL for tasks assessed/performed Overall Cognitive Status: No family/caregiver present to determine baseline cognitive functioning Area of Impairment: Problem solving                       Following Commands: Follows one step commands with increased time, Follows one step commands inconsistently Safety/Judgement: Decreased awareness of safety, Decreased awareness of deficits   Problem Solving: Slow processing General Comments: Confusion noted at times        Exercises      General Comments  Pertinent Vitals/Pain Pain Assessment Pain Assessment: Faces Faces Pain Scale: Hurts a little bit Pain Location: low back Pain Descriptors / Indicators: Grimacing Pain Intervention(s): Limited activity within patient's tolerance    Home Living                          Prior Function            PT Goals (current goals can now be found in the care plan section) Acute Rehab PT Goals PT Goal Formulation: With  patient Time For Goal Achievement: 11/11/21 Potential to Achieve Goals: Fair Progress towards PT goals: Progressing toward goals    Frequency    Min 3X/week      PT Plan Current plan remains appropriate    Co-evaluation              AM-PAC PT "6 Clicks" Mobility   Outcome Measure  Help needed turning from your back to your side while in a flat bed without using bedrails?: None Help needed moving from lying on your back to sitting on the side of a flat bed without using bedrails?: None Help needed moving to and from a bed to a chair (including a wheelchair)?: A Little Help needed standing up from a chair using your arms (e.g., wheelchair or bedside chair)?: A Little Help needed to walk in hospital room?: A Little Help needed climbing 3-5 steps with a railing? : A Little 6 Click Score: 20    End of Session Equipment Utilized During Treatment: Gait belt Activity Tolerance: Patient tolerated treatment well Patient left: in chair;with chair alarm set;with call bell/phone within reach   PT Visit Diagnosis: Unsteadiness on feet (R26.81);History of falling (Z91.81);Muscle weakness (generalized) (M62.81)     Time: 3016-0109 PT Time Calculation (min) (ACUTE ONLY): 23 min  Charges:  $Gait Training: 8-22 mins $Therapeutic Activity: 8-22 mins                     Shylin Keizer L. Tamala Julian, PT  10/30/2021    Galen Manila 10/30/2021, 1:39 PM

## 2021-10-31 ENCOUNTER — Other Ambulatory Visit: Payer: Self-pay | Admitting: Behavioral Health

## 2021-10-31 DIAGNOSIS — F410 Panic disorder [episodic paroxysmal anxiety] without agoraphobia: Secondary | ICD-10-CM

## 2021-10-31 DIAGNOSIS — F333 Major depressive disorder, recurrent, severe with psychotic symptoms: Secondary | ICD-10-CM

## 2021-10-31 DIAGNOSIS — I959 Hypotension, unspecified: Secondary | ICD-10-CM | POA: Diagnosis not present

## 2021-10-31 DIAGNOSIS — R443 Hallucinations, unspecified: Secondary | ICD-10-CM

## 2021-10-31 DIAGNOSIS — F411 Generalized anxiety disorder: Secondary | ICD-10-CM

## 2021-10-31 LAB — BASIC METABOLIC PANEL
Anion gap: 5 (ref 5–15)
BUN: 16 mg/dL (ref 8–23)
CO2: 21 mmol/L — ABNORMAL LOW (ref 22–32)
Calcium: 9.3 mg/dL (ref 8.9–10.3)
Chloride: 113 mmol/L — ABNORMAL HIGH (ref 98–111)
Creatinine, Ser: 1.44 mg/dL — ABNORMAL HIGH (ref 0.44–1.00)
GFR, Estimated: 38 mL/min — ABNORMAL LOW (ref >60)
Glucose, Bld: 123 mg/dL — ABNORMAL HIGH (ref 70–99)
Potassium: 3.7 mmol/L (ref 3.5–5.1)
Sodium: 139 mmol/L (ref 135–145)

## 2021-10-31 LAB — GLUCOSE, CAPILLARY
Glucose-Capillary: 115 mg/dL — ABNORMAL HIGH (ref 70–99)
Glucose-Capillary: 245 mg/dL — ABNORMAL HIGH (ref 70–99)
Glucose-Capillary: 96 mg/dL (ref 70–99)

## 2021-10-31 NOTE — TOC Progression Note (Addendum)
Transition of Care [facility]) - Progression Note    Patient Details  Name: Danielle [name] MRN: [id number] Date of Birth: 01-Apr-1945  Transition of Care [facility]) CM/SW [place], RN Phone Number: 10/31/2021, 2:29 PM  Clinical Narrative:  Received call from patient's husband Ed who reports SNF choice is [facility]. Notified Shanna at [facility] who reports a short term SNF bed is available today. This RNCM initiated insurance via [place], awaiting response.  Per Rachel Moulds with [facility] patient can be accepted until 8pm today and will accept on the weekend.   TOC will continue to follow.  - 4:35 p Received call from patient's husband who reports he and patient's son have decided to take patient home and declined short term SNF placement. This RNCM encouraged patient's husband to notify the RN. Notified MD, RN.  Spoke with Sharee Pimple with [facility], cancelled SNF auth. Notified Shanna at [facility] of patient's family declining SNF bed offer.  Offered HHC choice, notified Claiborne Billings with [facility] who will follow patient post discharge. [place] agency information on discharge paper work.   Patient's husband will transport home at discharge.      No additional TOC needs.    Expected Discharge Plan: [place] Barriers to Discharge: Continued Medical Work up  Expected Discharge Plan and Services Expected Discharge Plan: [place] In-house Referral: NA Discharge Planning Services: CM Consult Post Acute Care Choice: [place] Living arrangements for the past 2 months: Single Family Home                 DME Arranged: N/A DME Agency: NA       HH Arranged: NA HH Agency: NA         Social Determinants of Health (SDOH) Interventions    Readmission Risk Interventions     No data to display

## 2021-10-31 NOTE — Progress Notes (Signed)
Occupational Therapy Treatment Patient Details Name: Danielle Danielle [name] MRN: [id number] DOB: March 28, 1945 Today's Date: 10/31/2021   History of present illness Pt is as 76 y/o F admitted on 10/27/21 after presenting to the ED with c/o 2 falls that Danielle [name]. Pt with c/o neck pain & pain along L side of body. Imaging revealed L3 compression fx but neurosurgery not recommending sx at this time. Based on labs, pt felt to be in AKI. PMH:  possible dementia, HTN, high cholesterol, mitral regurgitation, CAD, DM   OT comments  Patient presented with waxing and waning confusion today, as she occasionally referenced "something is going on outside."  She reported feelings of worry and fear, however she was unable to recall or state why she was experiencing this. When participating in toileting tasks at bathroom level, she was unable to recall and problem-solve how to perform hygiene, therefore she required increased cueing in this regard. She continues to demonstrate unsteadiness in standing, requiring steadying assist & use of a rolling walker when in standing. She will continue to benefit from OT services to maximize her safety and independence with ADLs.    Recommendations for follow up therapy are one component of a multi-disciplinary discharge planning process, led by the attending physician.  Recommendations may be updated based on patient status, additional functional criteria and insurance authorization.    Follow Up Recommendations  Skilled nursing-short term rehab (<3 hours/Danielle [name])    Assistance Recommended at Discharge Intermittent Supervision/Assistance  Patient can return home with the following  A little help with walking and/or transfers;A little help with bathing/dressing/bathroom;Assistance with cooking/housework;Direct supervision/assist for financial management;Assist for transportation;Direct supervision/assist for medications management   Equipment Recommendations  None recommended by OT        Precautions / Restrictions Precautions Precautions: Fall;Back Restrictions Weight Bearing Restrictions: No Other Position/Activity Restrictions: acute L3 compression fracture       Mobility Bed Mobility               General bed mobility comments: up in recliner upon arrival Patient Response: Anxious  Transfers Overall transfer level: Needs assistance Equipment used: Rolling walker (2 wheels) Transfers: Sit to/from Stand Sit to Stand: Min guard                     ADL either performed or assessed with clinical judgement   ADL Overall ADL's : Needs assistance/impaired Eating/Feeding: Independent Eating/Feeding Details (indicate cue type and reason): based on clinical judgement Grooming: Min guard;Standing Grooming Details (indicate cue type and reason): She performed face washing and teeth brushing in standing at the sink. She required occasional cues for short-term recall and sequencing, as intermittent confusion was noted.                 Toilet Transfer: Min guard;Cueing for safety;Cueing for sequencing;Rolling walker (2 wheels) Toilet Transfer Details (indicate cue type and reason): She required cues for walker positioning, to step back closer to the toilet prior to sitting, and to use grab bar for support as needed. Toileting- Clothing Manipulation and Hygiene: Minimal assistance;Sit to/from stand Toileting - Clothing Manipulation Details (indicate cue type and reason): Pt with confusion about how to go about performing peri-hygiene, requiring verbal cues in this regard. She also required assist for clothing management & steadying in standing.              Cognition Arousal/Alertness: Awake/alert     Area of Impairment: Problem solving, Memory  Memory: Decreased recall of precautions Following Commands: Follows one step commands with increased time     Problem Solving: Difficulty sequencing General Comments: Confusion noted  at times                   Pertinent Vitals/ Pain       Pain Assessment Pain Assessment: Faces Faces Pain Scale: Hurts a little bit Pain Location: L flank Pain Intervention(s): Repositioned         Frequency  Min 2X/week        Progress Toward Goals  OT Goals(current goals can now be found in the care plan section)  Progress towards OT goals: Progressing toward goals  Acute Rehab OT Goals Patient Stated Goal: to figure out what is going on OT Goal Formulation: With patient Time For Goal Achievement: 11/11/21 Potential to Achieve Goals: Good  Plan Discharge plan remains appropriate       AM-PAC OT "6 Clicks" Daily Activity     Outcome Measure   Help from another person eating meals?: None Help from another person taking care of personal grooming?: A Little Help from another person toileting, which includes using toliet, bedpan, or urinal?: A Little Help from another person bathing (including washing, rinsing, drying)?: A Lot Help from another person to put on and taking off regular upper body clothing?: None Help from another person to put on and taking off regular lower body clothing?: A Little 6 Click Score: 19    End of Session Equipment Utilized During Treatment: Rolling walker (2 wheels);Gait belt  OT Visit Diagnosis: Unsteadiness on feet (R26.81);Repeated falls (R29.6)   Activity Tolerance Patient tolerated treatment well   Patient Left in chair;with call bell/phone within reach;with chair alarm set   Nurse Communication Mobility status        Time: 4665-9935 OT Time Calculation (min): 21 min  Charges: OT General Charges $OT Visit: 1 Visit OT Treatments $Self Care/Home Management : 8-22 mins     Leota Sauers, OTR/L 10/31/2021, 1:39 PM

## 2021-10-31 NOTE — Progress Notes (Signed)
PROGRESS NOTE  Danielle [name]  HUD:[id number] DOB: 1945-02-19 DOA: 10/27/2021 PCP: [name], MD   Brief Narrative:  Patient is a 76 year old female with history of type 2 diabetes, hypertension, hyperlipidemia, hypothyroidism, depression/anxiety, OSA on CPAP, memory loss who presented to the emergency with complaint of multiple falls at home.  On presentation, she was hypertensive, lab work showed AKI with creatinine of 2.6.  Imaging showed closed compression fracture of L3 lumbar vertebra.  Hospital course also remarkable for delirium, hallucinations.  PT/OT recommending SNF on discharge.  Medically stable for discharge whenever possible.TOC following  Assessment & Plan:  Principal Problem:   Hypotension Active Problems:   AKI (acute kidney injury) ([place])   Closed compression fracture of L3 lumbar vertebra, initial encounter (HCC)   OSA (obstructive sleep apnea)   Hyperlipidemia LDL goal <70   Hypothyroidism   Depression with anxiety   Urinary tract infection without hematuria   UTI (urinary tract infection)   Hallucinations  Frequent falls: Lives at home.  Report of frequent falls.  Imaging showed closed compression fracture of L3 lumbar vertebra, 2 to 3 mm height loss, no retropulsion.  Was discussed with neurosurgery, recommended conservative management.  Pain well controlled.  PT/OT recommended SNF on discharge.  TOC following.  PASARR pending  Hypotension: Initially hypotensive on presentation.  Given IV fluids.  Blood pressure has been stable now.  Home antihypertensive medications on hold.  AKI: Creatinine of 2.66 on presentation.  Baseline creatinine normal.  Gradually improving, now close to baseline.  Thought to be secondary to prerenal from hypovolemia.  CT abdomen/pelvis showed normal-appearing kidneys.  Losartan, hydrochlorothiazide on hold  Hallucinations/delirium: Some report of memory loss at baseline.  No clear documented history of dementia.  Hospital  course remarkable for hallucination, confusion, sundowning.  Continue supportive care, delirium precautions.  Currently on Zyprexa 5 mg daily.  Ativan has been discontinued that she was taking before. This morning she remains alert and orientedX3  Suspected UTI: Urinalysis was concerning for UTI.  Urine culture not sent.  Currently on Bactrim, plan for 3 days course  Depression/anxiety: On Zoloft, Lamictal.  Follows with outpatient psychiatry.  Ativan discontinued  Hypothyroidism: Continue Synthyroid  Hyperlipidemia: Continue statin  OSA: Continue CPAP nightly  Hypokalemia: Supplemented with potassium  Obesity: BMI of 36.4        DVT prophylaxis:enoxaparin (LOVENOX) injection 40 mg Start: 10/29/21 2200     Code Status: Full Code  Family Communication: None at bedside,called son [name] on phone for update,call not received  Patient status:Inpatient  Patient is from :Home  Anticipated discharge to:SNF  Estimated DC date:Not sure, medically stable for discharge, placement pending.   Consultants: None  Procedures: None  Antimicrobials:  Anti-infectives (From admission, onward)    Start     Dose/Rate Route Frequency Ordered Stop   10/29/21 1200  sulfamethoxazole-trimethoprim (BACTRIM DS) 800-160 MG per tablet 1 tablet        1 tablet Oral Every 12 hours 10/29/21 1101 11/01/21 0959   10/28/21 1000  doxycycline (VIBRAMYCIN) 100 mg in sodium chloride 0.9 % 250 mL IVPB  Status:  Discontinued        100 mg 125 mL/hr over 120 Minutes Intravenous Every 12 hours 10/28/21 0857 10/29/21 1013       Subjective: Patient seen and examined at the bedside today.  Sitting in the chair.  Appears comfortable, hemodynamically stable, denies any new complaints today.  Objective: Vitals:   10/30/21 1134 10/30/21 1950 10/31/21 0451 10/31/21 0454  BP:  124/63 138/68 126/63   Pulse: 82 92 87   Resp: '18 19 19   '$ Temp: 98.4 F (36.9 C) 98.7 F (37.1 C) 98.2 F (36.8 C)   TempSrc: Oral  Oral Oral   SpO2: 95% 98% 95%   Weight:    90.6 kg  Height:        Intake/Output Summary (Last 24 hours) at 10/31/2021 1406 Last data filed at 10/31/2021 0900 Gross per 24 hour  Intake 360 ml  Output 450 ml  Net -90 ml   Filed Weights   10/29/21 0350 10/30/21 0500 10/31/21 0454  Weight: 92.1 kg 90.5 kg 90.6 kg    Examination:  General exam: Overall comfortable, not in distress,obese HEENT: PERRL Respiratory system:  no wheezes or crackles  Cardiovascular system: S1 & S2 heard, RRR.  Gastrointestinal system: Abdomen is nondistended, soft and nontender. Central nervous system: Alert and awake, mostly oriented, obeys commands Extremities: No edema, no clubbing ,no cyanosis Skin: No rashes, no ulcers,no icterus     Data Reviewed: I have personally reviewed following labs and imaging studies  CBC: Recent Labs  Lab 10/27/21 1922 10/28/21 0540 10/29/21 0426 10/30/21 0409  WBC 14.3* 9.9 7.9 8.1  NEUTROABS 10.3*  --  4.7  --   HGB 14.0 12.9 13.3 12.8  HCT 41.1 39.0 39.6 37.5  MCV 87.4 89.9 88.2 88.9  PLT 252 229 204 937   Basic Metabolic Panel: Recent Labs  Lab 10/27/21 1922 10/28/21 0540 10/29/21 0426 10/30/21 0409 10/31/21 0631  NA 138 140 142 140 139  K 3.5 3.5 2.9* 3.3* 3.7  CL 102 110 113* 113* 113*  CO2 '26 25 23 22 '$ 21*  GLUCOSE 158* 91 119* 90 123*  BUN 36* 30* '18 15 16  '$ CREATININE 2.66* 2.25* 1.50* 1.35* 1.44*  CALCIUM 9.1 8.5* 8.7* 8.8* 9.3  MG  --   --  1.9 1.8  --      Recent Results (from the past 240 hour(s))  Culture, blood (Routine X 2) w Reflex to ID Panel     Status: None (Preliminary result)   Collection Time: 10/28/21 12:00 AM   Specimen: BLOOD  Result Value Ref Range Status   Specimen Description   Final    BLOOD BLOOD RIGHT WRIST Performed at [facility], [address] [place], [place] [address]    Special Requests   Final    BOTTLES DRAWN AEROBIC AND ANAEROBIC Blood Culture results may not be optimal due to  an inadequate volume of blood received in culture bottles Performed at [facility] [address] [place], [place] [address]    Culture   Final    NO GROWTH 3 DAYS Performed at [facility], [place] [address] [place], [place] [address]    Report Status PENDING  Incomplete  Culture, blood (Routine X 2) w Reflex to ID Panel     Status: None (Preliminary result)   Collection Time: 10/28/21 12:00 AM   Specimen: BLOOD  Result Value Ref Range Status   Specimen Description   Final    BLOOD LEFT ANTECUBITAL Performed at [facility] [address], [place], [place] [address]    Special Requests   Final    BOTTLES DRAWN AEROBIC AND ANAEROBIC Blood Culture adequate volume Performed at [facility] [address] [place], [place] [address]    Culture   Final    NO GROWTH 3 DAYS Performed at [facility],  [address], [place], [place] [address]    Report Status PENDING  Incomplete  Urine Culture     Status: Abnormal   Collection Time: 10/28/21  1:45 AM   Specimen: Urine, Catheterized  Result Value Ref Range Status   Specimen Description   Final    URINE, CATHETERIZED Performed at [facility] [address], [place], [place] [address]    Special Requests   Final    NONE Performed at [facility], [place] [address] [place], [place] [address]    Culture MULTIPLE SPECIES PRESENT, SUGGEST RECOLLECTION (A)  Final   Report Status 10/29/2021 FINAL  Final     Radiology Studies: No results found.  Scheduled Meds:  enoxaparin (LOVENOX) injection  40 mg Subcutaneous Q24H   fluticasone  2 spray Each Nare Daily   insulin aspart  0-9 Units Subcutaneous TID WC   lamoTRIgine  200 mg Oral Daily   levothyroxine  137 mcg Oral Q0600   OLANZapine  5 mg Oral QHS   rosuvastatin  40 mg Oral Daily   sertraline  50 mg Oral Daily   sulfamethoxazole-trimethoprim  1 tablet Oral Q12H    Continuous Infusions:   LOS: 3 days   Shelly Coss, MD [facility] P10/20/2023, 2:06 PM

## 2021-10-31 NOTE — TOC Progression Note (Signed)
Transition of Care [facility]) - Progression Note    Patient Details  Name: Danielle [name] MRN: [id number] Date of Birth: November 04, 1945  Transition of Care [facility]) CM/SW [place], RN Phone Number: 10/31/2021, 5:05 PM  Clinical Narrative:   HHPT/OT set up with [facility], no additional TOC needs.    Expected Discharge Plan: [place] Barriers to Discharge: No Barriers Identified  Expected Discharge Plan and Services Expected Discharge Plan: [place] In-house Referral: NA Discharge Planning Services: CM Consult Post Acute Care Choice: [place] Living arrangements for the past 2 months: Single Family Home Expected Discharge Date: 10/31/21               DME Arranged: N/A DME Agency: NA       HH Arranged: PT, OT HH Agency: [place] Date HH Agency Contacted: 10/31/21 Time [place]: [id number] Representative spoke with at [place]: [place] Determinants of Health ([place]) Interventions    Readmission Risk Interventions     No data to display

## 2021-10-31 NOTE — Plan of Care (Signed)
  Problem: Education: Goal: Ability to describe self-care measures that may prevent or decrease complications (Diabetes Survival Skills Education) will improve Outcome: Adequate for Discharge Goal: Individualized Educational Video(s) Outcome: Adequate for Discharge   Problem: Coping: Goal: Ability to adjust to condition or change in health will improve Outcome: Adequate for Discharge   Problem: Fluid Volume: Goal: Ability to maintain a balanced intake and output will improve Outcome: Adequate for Discharge   Problem: Health Behavior/Discharge Planning: Goal: Ability to identify and utilize available resources and services will improve Outcome: Adequate for Discharge Goal: Ability to manage health-related needs will improve Outcome: Adequate for Discharge   Problem: Metabolic: Goal: Ability to maintain appropriate glucose levels will improve Outcome: Adequate for Discharge   Problem: Nutritional: Goal: Maintenance of adequate nutrition will improve Outcome: Adequate for Discharge Goal: Progress toward achieving an optimal weight will improve Outcome: Adequate for Discharge   Problem: Skin Integrity: Goal: Risk for impaired skin integrity will decrease Outcome: Adequate for Discharge   Problem: Tissue Perfusion: Goal: Adequacy of tissue perfusion will improve Outcome: Adequate for Discharge   Problem: Education: Goal: Knowledge of General Education information will improve Description: Including pain rating scale, medication(s)/side effects and non-pharmacologic comfort measures Outcome: Adequate for Discharge   Problem: Health Behavior/Discharge Planning: Goal: Ability to manage health-related needs will improve Outcome: Adequate for Discharge   Problem: Clinical Measurements: Goal: Ability to maintain clinical measurements within normal limits will improve Outcome: Adequate for Discharge Goal: Will remain free from infection Outcome: Adequate for Discharge Goal:  Diagnostic test results will improve Outcome: Adequate for Discharge Goal: Respiratory complications will improve Outcome: Adequate for Discharge Goal: Cardiovascular complication will be avoided Outcome: Adequate for Discharge   Problem: Activity: Goal: Risk for activity intolerance will decrease Outcome: Adequate for Discharge   Problem: Nutrition: Goal: Adequate nutrition will be maintained Outcome: Adequate for Discharge   Problem: Coping: Goal: Level of anxiety will decrease Outcome: Adequate for Discharge   Problem: Elimination: Goal: Will not experience complications related to bowel motility Outcome: Adequate for Discharge Goal: Will not experience complications related to urinary retention Outcome: Adequate for Discharge   Problem: Pain Managment: Goal: General experience of comfort will improve Outcome: Adequate for Discharge   Problem: Safety: Goal: Ability to remain free from injury will improve Outcome: Adequate for Discharge   Problem: Skin Integrity: Goal: Risk for impaired skin integrity will decrease Outcome: Adequate for Discharge   Problem: Safety: Goal: Non-violent Restraint(s) Outcome: Adequate for Discharge   

## 2021-10-31 NOTE — Discharge Summary (Signed)
Physician Discharge Summary  Julieth Tugman WCB:[id number] DOB: 06-01-1945 DOA: 10/27/2021  PCP: [name], MD  Admit date: 10/27/2021 Discharge date: 10/31/2021  Admitted From: Home Disposition:  Home  Discharge Condition:Stable CODE STATUS:FULL Diet recommendation:  Carb Modified  Brief/Interim Summary: Patient is a 76 year old female with history of type 2 diabetes, hypertension, hyperlipidemia, hypothyroidism, depression/anxiety, OSA on CPAP, memory loss who presented to the emergency with complaint of multiple falls at home.  On presentation, she was hypertensive, lab work showed AKI with creatinine of 2.6.  Imaging showed closed compression fracture of L3 lumbar vertebra.  Hospital course also remarkable for delirium, hallucinations.  PT/OT recommending SNF on discharge.  Family declined to take her to SNF and wanted to take her home.  Orders placed for home health.  Medically stable for discharge.  Following problems were addressed during her hospitalization:  Frequent falls: Lives at home.  Report of frequent falls.  Imaging showed closed compression fracture of L3 lumbar vertebra, 2 to 3 mm height loss, no retropulsion.  Was discussed with neurosurgery, recommended conservative management.  Pain well controlled,not needing any narcotics.  PT/OT recommended SNF on discharge.  Family declined to take her to SNF so she is being discharged home.  Home health orders placed  Hypotension: Initially hypotensive on presentation.  Given IV fluids.  Blood pressure has been stable now.  Home antihypertensive medications on hold.   AKI: Creatinine of 2.66 on presentation.  Baseline creatinine normal.  Gradually improving, now close to baseline.  Thought to be secondary to prerenal from hypovolemia.  CT abdomen/pelvis showed normal-appearing kidneys.  Losartan, hydrochlorothiazide on hold.  Check BMP in a week   Hallucinations/delirium: Some report of memory loss at baseline.  No  clear documented history of dementia.  Hospital course remarkable for hallucination, confusion, sundowning. Currently on Zyprexa 5 mg daily.  Ativan has been discontinued that she was taking before. This morning she remains alert and more sleepy oriented  Suspected UTI: Urinalysis was concerning for UTI.  Urine culture not sent.  Treated with 3 days course of Bactrim   Depression/anxiety: On Zoloft, Lamictal.  Follows with outpatient psychiatry.  Ativan discontinued   Hypothyroidism: Continue Synthyroid   Hyperlipidemia: Continue statin   OSA: Continue CPAP nightly   Hypokalemia: Supplemented with potassium   Obesity: BMI of 36.4   Discharge Diagnoses:  Principal Problem:   Hypotension Active Problems:   AKI (acute kidney injury) ([place])   Closed compression fracture of L3 lumbar vertebra, initial encounter (HCC)   OSA (obstructive sleep apnea)   Hyperlipidemia LDL goal <70   Hypothyroidism   Depression with anxiety   Urinary tract infection without hematuria   UTI (urinary tract infection)   Hallucinations    Discharge Instructions  Discharge Instructions     Diet Carb Modified   Complete by: As directed    Discharge instructions   Complete by: As directed    1)Please take prescribed medications as instructed 2)Follow up with your PCP in a week,do a BMP test to check your kidney function during the follow up 3)Follow up with psychiatry as an outpatient to address your anxiety   Increase activity slowly   Complete by: As directed       Allergies as of 10/31/2021       Reactions   Ceftin [cefuroxime Axetil] Hives, Swelling, Other (See Comments)   Swelling of face   Codeine Nausea Only   Compazine [prochlorperazine] Swelling, Other (See Comments)   Face and tongue swelling  Medication List     STOP taking these medications    FLUoxetine 20 MG capsule Commonly known as: PROZAC   hydrochlorothiazide 25 MG tablet Commonly known as: HYDRODIURIL    LORazepam 1 MG tablet Commonly known as: ATIVAN   losartan 50 MG tablet Commonly known as: COZAAR       TAKE these medications    BIOTIN PO Take 5,000 mcg by mouth.   CALCIUM-VITAMIN D PO Take 1 capsule by mouth daily. 1200-1000 calcium-vitamin d   fluticasone 50 MCG/ACT nasal spray Commonly known as: FLONASE Place 1 spray into both nostrils daily as needed for allergies or rhinitis.   hydrOXYzine 25 MG tablet Commonly known as: ATARAX Take 1 tablet (25 mg total) by mouth every 6 (six) hours as needed for anxiety. What changed: Another medication with the same name was removed. Continue taking this medication, and follow the directions you see here.   lamoTRIgine 200 MG tablet Commonly known as: LAMICTAL Take 200 mg by mouth at bedtime.   levothyroxine 137 MCG tablet Commonly known as: SYNTHROID Take 137 mcg by mouth daily before breakfast.   Magnesium 100 MG Caps Take 100 mg by mouth daily.   metFORMIN 500 MG 24 hr tablet Commonly known as: GLUCOPHAGE-XR Take 500 mg by mouth 2 (two) times daily with a meal.   metoprolol tartrate 25 MG tablet Commonly known as: LOPRESSOR TAKE 1/2 TO 1 TABLET IN THE MORNING AND AT BEDTIME FOR PALPITATIONS. NEED APPOINTMENT FOR FURTHER REFILLS What changed: See the new instructions.   OLANZapine 2.5 MG tablet Commonly known as: ZyPREXA Take one tablet 2.5 mg for 7 days, then take two tablets 5 mg at bedtime daily. What changed:  how much to take how to take this when to take this additional instructions   ondansetron 4 MG tablet Commonly known as: ZOFRAN Take 4 mg by mouth every 8 (eight) hours as needed for nausea or vomiting.   rosuvastatin 40 MG tablet Commonly known as: CRESTOR Take 1 tablet (40 mg total) by mouth daily.   sertraline 50 MG tablet Commonly known as: Zoloft Take 1 tablet (50 mg total) by mouth daily. Take 1/2 tablet 25 mg for 7 days, then take one whole tablet 50 mg total daily after breakfast What  changed:  when to take this additional instructions   Vitamin D (Ergocalciferol) 1.25 MG (50000 UNIT) Caps capsule Commonly known as: DRISDOL Take 50,000 Units by mouth every Wednesday.        Contact information for follow-up providers     [name], MD. Schedule an appointment as soon as possible for a visit in 1 week(s).   Specialty: Internal Medicine Contact information: [address] [place] [address] [phone]              Contact information for after-discharge care     [facility] .   Service: Skilled Nursing Contact information: [address] [address] [phone]                    Allergies  Allergen Reactions   Ceftin [Cefuroxime Axetil] Hives, Swelling and Other (See Comments)    Swelling of face   Codeine Nausea Only   Compazine [Prochlorperazine] Swelling and Other (See Comments)    Face and tongue swelling    Consultations: None   Procedures/Studies: CT CHEST ABDOMEN PELVIS WO CONTRAST  Result Date: 10/27/2021 CLINICAL  DATA:  Two falls today with pain in lower back and left hip EXAM: CT CHEST, ABDOMEN AND PELVIS WITHOUT CONTRAST TECHNIQUE: Multidetector CT imaging of the chest, abdomen and pelvis was performed following the standard protocol without IV contrast. RADIATION DOSE REDUCTION: This exam was performed according to the departmental dose-optimization program which includes automated exposure control, adjustment of the mA and/or kV according to patient size and/or use of iterative reconstruction technique. COMPARISON:  CT abdomen and pelvis 09/27/2021 FINDINGS: CT CHEST FINDINGS Cardiovascular: Borderline cardiomegaly. Coronary artery atherosclerotic calcification. Aortic valve and mitral annular calcification. Mediastinum/Nodes: No enlarged mediastinal, hilar, or axillary lymph nodes. Thyroid gland, trachea,  and esophagus demonstrate no significant findings. Lungs/Pleura: Patchy areas of scarring/atelectasis. No focal consolidation, pleural effusion, or pneumothorax. Musculoskeletal: No rib fractures. CT ABDOMEN PELVIS FINDINGS Hepatobiliary: No focal liver abnormality is seen. Status post cholecystectomy. No biliary dilatation. Pancreas: Unremarkable. No pancreatic ductal dilatation or surrounding inflammatory changes. Spleen: Normal in size without focal abnormality. Adrenals/Urinary Tract: Adrenal glands are unremarkable. Kidneys are normal, without renal calculi, suspicious focal lesion, or hydronephrosis. Bladder and inferior pelvis are poorly evaluated due to streak artifact from bilateral THA. Stomach/Bowel: Stomach is within normal limits. The appendix is not visualized. No evidence of bowel wall thickening, distention, or inflammatory changes. Vascular/Lymphatic: Aortic atherosclerosis. No enlarged abdominal or pelvic lymph nodes. Reproductive: Status post hysterectomy. Poorly evaluated due to streak artifact. Other: No free intraperitoneal fluid or air. Musculoskeletal: Acute compression fracture of the superior endplate of L3 with a proximally 2-3 mm of compression. No retropulsion. Otherwise no acute osseous abnormality. Thoracolumbar spondylosis. Chronic left-sided L5 pars defects. Grade 1 anterolisthesis L4 on L5. No definite pelvic fracture. Bilateral THA. IMPRESSION: Acute compression fracture of the superior endplate of L3 with 2-3 mm of height loss. No retropulsion. Otherwise no acute traumatic abnormality in the chest, abdomen, or pelvis. Coronary artery and Aortic Atherosclerosis (ICD10-I70.0). Electronically Signed   By: Placido Sou M.D.   On: 10/27/2021 21:28   CT Cervical Spine Wo Contrast  Result Date: 10/27/2021 CLINICAL DATA:  Trauma.  Fall. EXAM: CT CERVICAL SPINE WITHOUT CONTRAST TECHNIQUE: Multidetector CT imaging of the cervical spine was performed without intravenous contrast.  Multiplanar CT image reconstructions were also generated. RADIATION DOSE REDUCTION: This exam was performed according to the departmental dose-optimization program which includes automated exposure control, adjustment of the mA and/or kV according to patient size and/or use of iterative reconstruction technique. COMPARISON:  None Available. FINDINGS: Alignment: Normal. Skull base and vertebrae: The bones are osteopenic. There is no acute fracture. No focal osseous lesion. C5, C6, C7 anterior fusion plate is present without complication. Soft tissues and spinal canal: No prevertebral fluid or swelling. No visible canal hematoma. Disc levels: There is disc space narrowing at C2-C3, C3-C4, C4-C5 C7 T1 compatible with degenerative change. Moderate bilateral neural foraminal stenosis at C3-C4 and mild bilateral neural foraminal stenosis at C4-C5 secondary to uncovertebral spurring. No severe central canal stenosis identified at any level. Upper chest: Negative. Other: Small air-fluid levels in the bilateral maxillary sinuses. IMPRESSION: 1. No acute fracture or traumatic subluxation of the cervical spine. 2. C5-C7 anterior fusion plate without complication. 3. Small air-fluid levels in the bilateral maxillary sinuses. Correlate for acute sinusitis. Electronically Signed   By: Ronney Asters M.D.   On: 10/27/2021 21:26   CT Head Wo Contrast  Result Date: 10/27/2021 CLINICAL DATA:  Head trauma EXAM: CT HEAD WITHOUT CONTRAST TECHNIQUE: Contiguous axial images were obtained from the base of the skull through  the vertex without intravenous contrast. RADIATION DOSE REDUCTION: This exam was performed according to the departmental dose-optimization program which includes automated exposure control, adjustment of the mA and/or kV according to patient size and/or use of iterative reconstruction technique. COMPARISON:  CT brain 07/16/2021 FINDINGS: Brain: No acute territorial infarction, hemorrhage or intracranial mass. Mild  chronic small vessel ischemic changes of the white matter. The ventricles are nonenlarged Vascular: No hyperdense vessel or unexpected calcification. Skull: Normal. Negative for fracture or focal lesion. Sinuses/Orbits: No acute finding. Other: None IMPRESSION: 1. No CT evidence for acute intracranial abnormality. 2. Mild chronic small vessel ischemic changes of the white matter Electronically Signed   By: Donavan Foil M.D.   On: 10/27/2021 21:18   DG Chest Port 1 View  Result Date: 10/27/2021 CLINICAL DATA:  Fall EXAM: PORTABLE CHEST 1 VIEW COMPARISON:  12/29/2017 FINDINGS: Cardiomegaly with pulmonary vascular congestion. Streaky left perihilar opacity. No large pleural fluid collection. No pneumothorax. IMPRESSION: 1. Cardiomegaly with pulmonary vascular congestion. 2. Streaky left perihilar opacity may represent atelectasis or pneumonia. Electronically Signed   By: Davina Poke D.O.   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 Arthroplasty components are in their expected alignment. No periprosthetic lucency or fracture. Pelvic bony ring intact. Partially imaged right hip arthroplasty appears intact. No appreciable soft tissue abnormality. IMPRESSION: No acute osseous abnormality. 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 Electronically Signed   By: Davina Poke D.O.   On: 10/27/2021 18:07      Subjective: Patient seen and examined at bedside today.  Hemodynamically stable.  No new complaints or issues.  Long discussion held with son on phone this evening.  He wants to take her home and declines to be placed at a SNF.  Discharge Exam: Vitals:   10/31/21 0451 10/31/21 1458  BP: 126/63 (!) 135/59  Pulse: 87 88  Resp: 19 18   Temp: 98.2 F (36.8 C) 98 F (36.7 C)  SpO2: 95% 99%   Vitals:   10/30/21 1950 10/31/21 0451 10/31/21 0454 10/31/21 1458  BP: 138/68 126/63  (!) 135/59  Pulse: 92 87  88  Resp: '19 19  18  '$ Temp: 98.7 F (37.1 C) 98.2 F (36.8 C)  98 F (36.7 C)  TempSrc: Oral Oral  Oral  SpO2: 98% 95%  99%  Weight:   90.6 kg   Height:        General: Pt is alert, awake, not in acute distress Cardiovascular: RRR, S1/S2 +, no rubs, no gallops Respiratory: CTA bilaterally, no wheezing, no rhonchi Abdominal: Soft, NT, ND, bowel sounds + Extremities: no edema, no cyanosis    The results of significant diagnostics from this hospitalization (including imaging, microbiology, ancillary and laboratory) are listed below for reference.     Microbiology: Recent Results (from the past 240 hour(s))  Culture, blood (Routine X 2) w Reflex to ID Panel     Status: None (Preliminary result)   Collection Time: 10/28/21 12:00 AM   Specimen: BLOOD  Result Value Ref Range Status   Specimen Description   Final    BLOOD BLOOD RIGHT WRIST Performed at [facility] [address] [place], [place] [address]    Special Requests   Final    BOTTLES DRAWN AEROBIC  AND ANAEROBIC Blood Culture results may not be optimal due to an inadequate volume of blood received in culture bottles Performed at [facility], [place] [address], [place], [place] [address]    Culture   Final    NO GROWTH 3 DAYS Performed at [facility], [place] [address] [place], [place] [address]    Report Status PENDING  Incomplete  Culture, blood (Routine X 2) w Reflex to ID Panel     Status: None (Preliminary result)   Collection Time: 10/28/21 12:00 AM   Specimen: BLOOD  Result Value Ref Range Status   Specimen Description   Final    BLOOD LEFT ANTECUBITAL Performed at [facility] [address] [place], [place] [address]    Special Requests   Final    BOTTLES DRAWN AEROBIC  AND ANAEROBIC Blood Culture adequate volume Performed at [facility] [address], [place], [place] [address]    Culture   Final    NO GROWTH 3 DAYS Performed at [facility], [place] [address], [place], [place] [address]    Report Status PENDING  Incomplete  Urine Culture     Status: Abnormal   Collection Time: 10/28/21  1:45 AM   Specimen: Urine, Catheterized  Result Value Ref Range Status   Specimen Description   Final    URINE, CATHETERIZED Performed at [facility] [address], [place], [place] [address]    Special Requests   Final    NONE Performed at [facility], [place] [address] [place], [place] [address]    Culture MULTIPLE SPECIES PRESENT, SUGGEST RECOLLECTION (A)  Final   Report Status 10/29/2021 FINAL  Final     Labs: BNP (last 3 results) Recent Labs    07/16/21 2000  BNP 61.6   Basic Metabolic Panel: Recent Labs  Lab 10/27/21 1922 10/28/21 0540 10/29/21 0426 10/30/21 0409 10/31/21 0631  NA 138 140 142 140 139  K 3.5 3.5 2.9* 3.3* 3.7  CL 102 110 113* 113* 113*  CO2 '26 25 23 22 '$ 21*  GLUCOSE 158* 91 119* 90 123*  BUN 36* 30* '18 15 16  '$ CREATININE 2.66* 2.25* 1.50* 1.35* 1.44*  CALCIUM 9.1 8.5* 8.7* 8.8* 9.3  MG  --   --  1.9 1.8  --    Liver Function Tests: No results for input(s): "AST", "ALT", "ALKPHOS", "BILITOT", "PROT", "ALBUMIN" in the last 168 hours. No results for input(s): "LIPASE", "AMYLASE" in the last 168 hours. No results for input(s): "AMMONIA" in the last 168 hours. CBC: Recent Labs  Lab 10/27/21 1922 10/28/21 0540 10/29/21 0426 10/30/21 0409  WBC 14.3* 9.9 7.9 8.1  NEUTROABS 10.3*  --  4.7  --   HGB 14.0 12.9 13.3 12.8  HCT 41.1 39.0 39.6 37.5  MCV 87.4 89.9 88.2 88.9  PLT 252 229 204 184   Cardiac Enzymes: No results for input(s): "CKTOTAL", "CKMB", "CKMBINDEX", "TROPONINI" in the last 168 hours. BNP: Invalid input(s): "POCBNP" CBG: Recent Labs  Lab  10/30/21 1130 10/30/21 1612 10/30/21 1954 10/31/21 0734 10/31/21 1208  GLUCAP 98 131* 132* 115* 245*   D-Dimer No results for input(s): "DDIMER" in the last 72 hours. Hgb A1c No results for input(s): "HGBA1C" in the last 72 hours. Lipid Profile No results for input(s): "CHOL", "HDL", "LDLCALC", "TRIG", "CHOLHDL", "LDLDIRECT" in the last 72 hours. Thyroid function studies Recent Labs    10/29/21 0426  TSH 6.798*   Anemia  work up Recent Labs    10/29/21 0426  VITAMINB12 304  FOLATE 8.6   Urinalysis    Component Value Date/Time   COLORURINE YELLOW 10/28/2021 0145   APPEARANCEUR CLOUDY (A) 10/28/2021 0145   LABSPEC 1.010 10/28/2021 0145   PHURINE 5.0 10/28/2021 0145   GLUCOSEU NEGATIVE 10/28/2021 0145   HGBUR NEGATIVE 10/28/2021 0145   BILIRUBINUR NEGATIVE 10/28/2021 0145   KETONESUR NEGATIVE 10/28/2021 0145   PROTEINUR 30 (A) 10/28/2021 0145   UROBILINOGEN 0.2 02/13/2011 1018   NITRITE NEGATIVE 10/28/2021 0145   LEUKOCYTESUR LARGE (A) 10/28/2021 0145   Sepsis Labs Recent Labs  Lab 10/27/21 1922 10/28/21 0540 10/29/21 0426 10/30/21 0409  WBC 14.3* 9.9 7.9 8.1   Microbiology Recent Results (from the past 240 hour(s))  Culture, blood (Routine X 2) w Reflex to ID Panel     Status: None (Preliminary result)   Collection Time: 10/28/21 12:00 AM   Specimen: BLOOD  Result Value Ref Range Status   Specimen Description   Final    BLOOD BLOOD RIGHT WRIST Performed at [facility], [place] [address], [place], [place] [address]    Special Requests   Final    BOTTLES DRAWN AEROBIC AND ANAEROBIC Blood Culture results may not be optimal due to an inadequate volume of blood received in culture bottles Performed at [facility] [address], [place], [place] [address]    Culture   Final    NO GROWTH 3 DAYS Performed at [facility], [place] [address], [place], [place] [address]    Report Status PENDING  Incomplete  Culture,  blood (Routine X 2) w Reflex to ID Panel     Status: None (Preliminary result)   Collection Time: 10/28/21 12:00 AM   Specimen: BLOOD  Result Value Ref Range Status   Specimen Description   Final    BLOOD LEFT ANTECUBITAL Performed at [facility] [address], [place], [place] [address]    Special Requests   Final    BOTTLES DRAWN AEROBIC AND ANAEROBIC Blood Culture adequate volume Performed at [facility] [address], [place], [place] [address]    Culture   Final    NO GROWTH 3 DAYS Performed at [facility], [place] [address] [place], [place] [address]    Report Status PENDING  Incomplete  Urine Culture     Status: Abnormal   Collection Time: 10/28/21  1:45 AM   Specimen: Urine, Catheterized  Result Value Ref Range Status   Specimen Description   Final    URINE, CATHETERIZED Performed at [facility] [address], [place], [place] [address]    Special Requests   Final    NONE Performed at [facility], [place] [address] [place],  [address]    Culture MULTIPLE SPECIES PRESENT, SUGGEST RECOLLECTION (A)  Final   Report Status 10/29/2021 FINAL  Final    Please note: You were cared for by a hospitalist during your hospital stay. Once you are discharged, your primary care physician will handle any further medical issues. Please note that NO REFILLS for any discharge medications will be authorized once you are discharged, as it is imperative that you return to your primary care physician (or establish a relationship with a primary care physician if you do not have one) for your post hospital discharge needs so that they can reassess your need for medications and monitor your lab values.  Time coordinating discharge: 40 minutes  SIGNED:   Shelly Coss, MD  [facility] 10/31/2021, 4:53 PM Pager [phone]  If 7PM-7AM, please contact  night-coverage [email or web address] Password [email or web address]

## 2021-10-31 NOTE — Progress Notes (Signed)
Mobility Specialist - Progress Note   10/31/21 1447  Mobility  Activity Ambulated with assistance to bathroom;Ambulated with assistance in hallway  Level of Assistance Contact guard assist, steadying assist  Assistive Device Front wheel walker  Distance Ambulated (ft) 100 ft  Range of Motion/Exercises Active  Activity Response Tolerated well  Mobility Referral Yes  $Mobility charge 1 Mobility   Pt was found on recliner chair and agreeable to ambulate. Needed cues throughout session and at EOS returned to recliner chair with all necessities in reach. RN notified.  Ferd Hibbs Mobility Specialist

## 2021-11-01 ENCOUNTER — Other Ambulatory Visit: Payer: Medicare HMO

## 2021-11-02 LAB — CULTURE, BLOOD (ROUTINE X 2)
Culture: NO GROWTH
Culture: NO GROWTH
Special Requests: ADEQUATE

## 2021-11-03 ENCOUNTER — Telehealth: Payer: Self-pay

## 2021-11-04 NOTE — Telephone Encounter (Signed)
I spent over 90 minutes with them last visit reviewing medications with them. Two one gallon bag fulls of medication. They were confused and I did the best I could with what I had. I did not increase her Lamictal.  Their med list was a hot mess. I told them she needs to have her non psych meds reviewed by her pcp. Lamictal is not causing the fall risk regardless. She needs to follow up with [facility] for her other meds.

## 2021-11-05 DIAGNOSIS — F3341 Major depressive disorder, recurrent, in partial remission: Secondary | ICD-10-CM | POA: Diagnosis not present

## 2021-11-05 DIAGNOSIS — R110 Nausea: Secondary | ICD-10-CM | POA: Diagnosis not present

## 2021-11-05 DIAGNOSIS — R7303 Prediabetes: Secondary | ICD-10-CM | POA: Diagnosis not present

## 2021-11-05 DIAGNOSIS — N179 Acute kidney failure, unspecified: Secondary | ICD-10-CM | POA: Diagnosis not present

## 2021-11-05 DIAGNOSIS — E039 Hypothyroidism, unspecified: Secondary | ICD-10-CM | POA: Diagnosis not present

## 2021-11-05 DIAGNOSIS — I10 Essential (primary) hypertension: Secondary | ICD-10-CM | POA: Diagnosis not present

## 2021-11-05 DIAGNOSIS — J069 Acute upper respiratory infection, unspecified: Secondary | ICD-10-CM | POA: Diagnosis not present

## 2021-11-06 ENCOUNTER — Encounter: Payer: Self-pay | Admitting: Behavioral Health

## 2021-11-06 ENCOUNTER — Ambulatory Visit (INDEPENDENT_AMBULATORY_CARE_PROVIDER_SITE_OTHER): Payer: Medicare HMO | Admitting: Behavioral Health

## 2021-11-06 DIAGNOSIS — F410 Panic disorder [episodic paroxysmal anxiety] without agoraphobia: Secondary | ICD-10-CM | POA: Diagnosis not present

## 2021-11-06 DIAGNOSIS — R443 Hallucinations, unspecified: Secondary | ICD-10-CM

## 2021-11-06 DIAGNOSIS — F5105 Insomnia due to other mental disorder: Secondary | ICD-10-CM

## 2021-11-06 DIAGNOSIS — F333 Major depressive disorder, recurrent, severe with psychotic symptoms: Secondary | ICD-10-CM

## 2021-11-06 DIAGNOSIS — F411 Generalized anxiety disorder: Secondary | ICD-10-CM

## 2021-11-06 DIAGNOSIS — F99 Mental disorder, not otherwise specified: Secondary | ICD-10-CM

## 2021-11-06 MED ORDER — OLANZAPINE 10 MG PO TABS: 10.0000 mg | ORAL_TABLET | Freq: Every day | ORAL | 1 refills | Status: DC

## 2021-11-06 MED ORDER — MIRTAZAPINE 15 MG PO TABS: 15.0000 mg | ORAL_TABLET | Freq: Every day | ORAL | 1 refills | Status: DC

## 2021-11-06 NOTE — Progress Notes (Signed)
[facility]  Patient ID: Danielle [name],  MRN: [id number]  PCP: [name], MD  Date of Evaluation: 11/06/2021 Time spent:30 minutes  Chief Complaint:  Chief Complaint   Anxiety; Depression; Follow-up; Medication Problem; Paranoid; Patient Education; Hallucinations     HISTORY/CURRENT STATUS: HPI   "Danielle [name]", 76 year old female presents to this office for initial visit and to establish care.  Her husband Ed and her son is present with her verbal consent. Not as much crying as last visit but still distressed. Recall is impaired and she cannot remember medications or recent events. Husband says she had recent CT and the results were nothing of significance . Son says that she continues to have visual hallucinations. She sees her dead pet in the kitchen and with one episode reported seeing pools of blood all over the kitchen. She appears to be mentally foggy with some expressive aphasia when asked direct questions.  States that her anxiety today is 6/10, and her depression is 6/10.  States that her sleep is broken and often wakes up several times per night.  She has not ben wearing her C-pap consistently.  She is very concerned about her memory but has been consulting with neurology.  States that neurology determined that she did not have Alzheimer's or progressing dementia.  Says also says that she sometimes sees images of people that are not present.  She denies auditory or command hallucinations.  Denies delirium.  Recent ER visit on 9/13 concluded UTI but was treated with AB. She has completed AB regimen.  Patient states that they will request records from [facility] be forwarded to this practice.   Patient cannot remember complete prior psychotropic medication trials: Prozac Pristiq Ativan Zoloft   Individual Medical History/ Review of Systems: Changes? :No   Allergies: Ceftin [cefuroxime axetil], Codeine, and Compazine [prochlorperazine]  Current Medications:   Current Outpatient Medications:    mirtazapine (REMERON) 15 MG tablet, Take 1 tablet (15 mg total) by mouth at bedtime., Disp: 30 tablet, Rfl: 1   OLANZapine (ZYPREXA) 10 MG tablet, Take 1 tablet (10 mg total) by mouth at bedtime., Disp: 30 tablet, Rfl: 1   BIOTIN PO, Take 5,000 mcg by mouth., Disp: , Rfl:    CALCIUM-VITAMIN D PO, Take 1 capsule by mouth daily. 1200-1000 calcium-vitamin d, Disp: , Rfl:    fluticasone (FLONASE) 50 MCG/ACT nasal spray, Place 1 spray into both nostrils daily as needed for allergies or rhinitis., Disp: , Rfl:    hydrOXYzine (ATARAX) 25 MG tablet, Take 1 tablet (25 mg total) by mouth every 6 (six) hours as needed for anxiety., Disp: 12 tablet, Rfl: 0   lamoTRIgine (LAMICTAL) 200 MG tablet, Take 200 mg by mouth at bedtime., Disp: , Rfl:    levothyroxine (SYNTHROID, LEVOTHROID) 137 MCG tablet, Take 137 mcg by mouth daily before breakfast., Disp: , Rfl:    Magnesium 100 MG CAPS, Take 100 mg by mouth daily., Disp: , Rfl:    metFORMIN (GLUCOPHAGE-XR) 500 MG 24 hr tablet, Take 500 mg by mouth 2 (two) times daily with a meal., Disp: , Rfl:    metoprolol tartrate (LOPRESSOR) 25 MG tablet, TAKE 1/2 TO 1 TABLET IN THE MORNING AND AT BEDTIME FOR PALPITATIONS. NEED APPOINTMENT FOR FURTHER REFILLS (Patient taking differently: Take 12.5-25 mg by mouth in the morning and at bedtime.), Disp: 15 tablet, Rfl: 0   OLANZapine (ZYPREXA) 2.5 MG tablet, Take one tablet 2.5 mg for 7 days, then take two tablets 5 mg at bedtime daily. (Patient  taking differently: Take 5 mg by mouth at bedtime.), Disp: 60 tablet, Rfl: 1   ondansetron (ZOFRAN) 4 MG tablet, Take 4 mg by mouth every 8 (eight) hours as needed for nausea or vomiting., Disp: , Rfl:    rosuvastatin (CRESTOR) 40 MG tablet, Take 1 tablet (40 mg total) by mouth daily., Disp: 90 tablet, Rfl: 3   sertraline (ZOLOFT) 50 MG tablet, Take 1 tablet (50 mg total) by mouth daily. Take 1/2 tablet 25 mg for 7 days, then take one whole tablet 50 mg  total daily after breakfast (Patient taking differently: Take 50 mg by mouth daily before breakfast.), Disp: 30 tablet, Rfl: 1   Vitamin D, Ergocalciferol, (DRISDOL) 1.25 MG (50000 UNIT) CAPS capsule, Take 50,000 Units by mouth every Wednesday., Disp: , Rfl:  Medication Side Effects: none  Family Medical/ Social History: Changes? No  MENTAL HEALTH EXAM:  There were no vitals taken for this visit.There is no height or weight on file to calculate BMI.  General Appearance: Casual, Neat, and Well Groomed  Eye Contact:  Good  Speech:  Clear and Coherent  Volume:  Normal  Mood:  Anxious, Depressed, and Dysphoric  Affect:  Depressed, Tearful, and Anxious  Thought Process:  Disorganized  Orientation:  Full (Time, Place, and Person)  Thought Content: Tangential   Suicidal Thoughts:  No  Homicidal Thoughts:  No  Memory:  Recent;   Fair  Judgement:  Impaired  Insight:  Fair  Psychomotor Activity:  Decreased  Concentration:  Concentration: Fair  Recall:  [employer] of Knowledge: Fair  Language: Fair  Assets:  Desire for Improvement Physical Health Resilience Social Support  ADL's:  Impaired  Cognition: Impaired,  Mild  Prognosis:  Fair    DIAGNOSES:    ICD-10-CM   1. Severe episode of recurrent major depressive disorder, with psychotic features ([place])  F33.3 OLANZapine (ZYPREXA) 10 MG tablet    2. Generalized anxiety disorder  F41.1 OLANZapine (ZYPREXA) 10 MG tablet    3. Hallucinations  R44.3 OLANZapine (ZYPREXA) 10 MG tablet    4. Panic attack  F41.0 OLANZapine (ZYPREXA) 10 MG tablet    5. Insomnia due to other mental disorder  F51.05 mirtazapine (REMERON) 15 MG tablet   F99       Receiving Psychotherapy: No    RECOMMENDATIONS:   Greater than 50% of  40  min face to face time with patient was spent on counseling and coordination of care.  We discussed her long history of anxiety and depression.  Her husband and son is present with her consent to assist in interview. She  appears foggy and has periods of crying. She has poor short-term when trying to remember medications. She has tangential speech and is disorganized. Husband and son discuss recent ER visit with UTI. They did culture and she was treated with ABX. Family says symptoms did not improve much after tx of UTI.    She is still having visual hallucination so we discussed adjusting her medications this visit.   Patient's husband states that they have been following up with neurology and that neurologist told them that patient did not have Alzheimer's or suspect dementia at this time.  Reviewed medications again for safety. I discussed with pt that if we could not get adequate response in a reasonable amount of time that she may need to be referred back to neurology for further testing and to rule out other organic etiology.   We agreed:   To continue Zoloft  50 mg daily until next visit. Will increase  Olanzapine to 10 mg daily at bedtime Will start Mirtazapine 15 mg at bedtime daily.  Instructed patient that she must utilize C-Pap when sleeping.  Pt stopped Ativan Pt stopped Lamictal  To report worsening symptoms or side effects promptly Provided emergency contact information To follow-up in 4 weeks to reassess progress Discussed potential metabolic side effects associated with atypical antipsychotics, as well as potential risk for movement side effects. Advised pt to contact office if movement side effects occur.   Discussed potential benefits, risk, and side effects of benzodiazepines to include potential risk of tolerance and dependence, as well as possible drowsiness.  Advised patient not to drive if experiencing drowsiness and to take lowest possible effective dose to minimize risk of dependence and tolerance.  Reviewed PDMP     Elwanda Brooklyn, NP

## 2021-11-11 DIAGNOSIS — K5900 Constipation, unspecified: Secondary | ICD-10-CM | POA: Diagnosis not present

## 2021-11-11 DIAGNOSIS — M8088XA Other osteoporosis with current pathological fracture, vertebra(e), initial encounter for fracture: Secondary | ICD-10-CM | POA: Diagnosis not present

## 2021-11-11 DIAGNOSIS — N390 Urinary tract infection, site not specified: Secondary | ICD-10-CM | POA: Diagnosis not present

## 2021-11-11 DIAGNOSIS — R609 Edema, unspecified: Secondary | ICD-10-CM | POA: Diagnosis not present

## 2021-11-11 DIAGNOSIS — N289 Disorder of kidney and ureter, unspecified: Secondary | ICD-10-CM | POA: Diagnosis not present

## 2021-11-17 DIAGNOSIS — E785 Hyperlipidemia, unspecified: Secondary | ICD-10-CM | POA: Diagnosis not present

## 2021-11-17 DIAGNOSIS — E039 Hypothyroidism, unspecified: Secondary | ICD-10-CM | POA: Diagnosis not present

## 2021-11-17 DIAGNOSIS — I10 Essential (primary) hypertension: Secondary | ICD-10-CM | POA: Diagnosis not present

## 2021-11-17 DIAGNOSIS — E119 Type 2 diabetes mellitus without complications: Secondary | ICD-10-CM | POA: Diagnosis not present

## 2021-11-17 DIAGNOSIS — E1169 Type 2 diabetes mellitus with other specified complication: Secondary | ICD-10-CM | POA: Diagnosis not present

## 2021-11-17 DIAGNOSIS — F3341 Major depressive disorder, recurrent, in partial remission: Secondary | ICD-10-CM | POA: Diagnosis not present

## 2021-11-23 ENCOUNTER — Observation Stay (HOSPITAL_BASED_OUTPATIENT_CLINIC_OR_DEPARTMENT_OTHER)
Admission: EM | Admit: 2021-11-23 | Discharge: 2021-11-26 | Disposition: A | Discharge status: Other Acute Inpatient Hospital | Payer: Medicare HMO | Attending: Emergency Medicine | Admitting: Emergency Medicine

## 2021-11-23 ENCOUNTER — Inpatient Hospital Stay (HOSPITAL_COMMUNITY): Payer: Medicare HMO

## 2021-11-23 ENCOUNTER — Emergency Department (HOSPITAL_BASED_OUTPATIENT_CLINIC_OR_DEPARTMENT_OTHER): Payer: Medicare HMO

## 2021-11-23 ENCOUNTER — Other Ambulatory Visit: Payer: Self-pay

## 2021-11-23 ENCOUNTER — Encounter (HOSPITAL_COMMUNITY): Payer: Self-pay

## 2021-11-23 ENCOUNTER — Encounter (HOSPITAL_BASED_OUTPATIENT_CLINIC_OR_DEPARTMENT_OTHER): Payer: Self-pay | Admitting: Pediatrics

## 2021-11-23 DIAGNOSIS — R410 Disorientation, unspecified: Secondary | ICD-10-CM | POA: Diagnosis not present

## 2021-11-23 DIAGNOSIS — Z1152 Encounter for screening for COVID-19: Secondary | ICD-10-CM | POA: Insufficient documentation

## 2021-11-23 DIAGNOSIS — Z96643 Presence of artificial hip joint, bilateral: Secondary | ICD-10-CM | POA: Insufficient documentation

## 2021-11-23 DIAGNOSIS — F418 Other specified anxiety disorders: Secondary | ICD-10-CM | POA: Diagnosis present

## 2021-11-23 DIAGNOSIS — F419 Anxiety disorder, unspecified: Secondary | ICD-10-CM | POA: Diagnosis not present

## 2021-11-23 DIAGNOSIS — R600 Localized edema: Secondary | ICD-10-CM | POA: Diagnosis present

## 2021-11-23 DIAGNOSIS — E119 Type 2 diabetes mellitus without complications: Secondary | ICD-10-CM | POA: Diagnosis not present

## 2021-11-23 DIAGNOSIS — Z79899 Other long term (current) drug therapy: Secondary | ICD-10-CM | POA: Diagnosis not present

## 2021-11-23 DIAGNOSIS — G4733 Obstructive sleep apnea (adult) (pediatric): Secondary | ICD-10-CM | POA: Diagnosis present

## 2021-11-23 DIAGNOSIS — R110 Nausea: Secondary | ICD-10-CM | POA: Diagnosis not present

## 2021-11-23 DIAGNOSIS — R441 Visual hallucinations: Secondary | ICD-10-CM | POA: Insufficient documentation

## 2021-11-23 DIAGNOSIS — F0390 Unspecified dementia without behavioral disturbance: Secondary | ICD-10-CM | POA: Insufficient documentation

## 2021-11-23 DIAGNOSIS — R0609 Other forms of dyspnea: Secondary | ICD-10-CM | POA: Diagnosis not present

## 2021-11-23 DIAGNOSIS — Z7984 Long term (current) use of oral hypoglycemic drugs: Secondary | ICD-10-CM | POA: Insufficient documentation

## 2021-11-23 DIAGNOSIS — E785 Hyperlipidemia, unspecified: Secondary | ICD-10-CM | POA: Diagnosis present

## 2021-11-23 DIAGNOSIS — I110 Hypertensive heart disease with heart failure: Secondary | ICD-10-CM | POA: Diagnosis not present

## 2021-11-23 DIAGNOSIS — F32A Depression, unspecified: Secondary | ICD-10-CM | POA: Insufficient documentation

## 2021-11-23 DIAGNOSIS — R4189 Other symptoms and signs involving cognitive functions and awareness: Secondary | ICD-10-CM | POA: Diagnosis present

## 2021-11-23 DIAGNOSIS — E039 Hypothyroidism, unspecified: Secondary | ICD-10-CM | POA: Diagnosis not present

## 2021-11-23 DIAGNOSIS — I509 Heart failure, unspecified: Secondary | ICD-10-CM | POA: Diagnosis not present

## 2021-11-23 DIAGNOSIS — R443 Hallucinations, unspecified: Secondary | ICD-10-CM

## 2021-11-23 DIAGNOSIS — I2510 Atherosclerotic heart disease of native coronary artery without angina pectoris: Secondary | ICD-10-CM | POA: Diagnosis not present

## 2021-11-23 DIAGNOSIS — I10 Essential (primary) hypertension: Secondary | ICD-10-CM | POA: Diagnosis present

## 2021-11-23 DIAGNOSIS — R0602 Shortness of breath: Secondary | ICD-10-CM | POA: Diagnosis not present

## 2021-11-23 HISTORY — DX: Major depressive disorder, single episode, unspecified: F32.9

## 2021-11-23 LAB — COMPREHENSIVE METABOLIC PANEL
ALT: 19 U/L (ref 0–44)
AST: 23 U/L (ref 15–41)
Albumin: 3.6 g/dL (ref 3.5–5.0)
Alkaline Phosphatase: 105 U/L (ref 38–126)
Anion gap: 7 (ref 5–15)
BUN: 7 mg/dL — ABNORMAL LOW (ref 8–23)
CO2: 25 mmol/L (ref 22–32)
Calcium: 8.8 mg/dL — ABNORMAL LOW (ref 8.9–10.3)
Chloride: 111 mmol/L (ref 98–111)
Creatinine, Ser: 1.01 mg/dL — ABNORMAL HIGH (ref 0.44–1.00)
GFR, Estimated: 58 mL/min — ABNORMAL LOW (ref >60)
Glucose, Bld: 108 mg/dL — ABNORMAL HIGH (ref 70–99)
Potassium: 3.7 mmol/L (ref 3.5–5.1)
Sodium: 143 mmol/L (ref 135–145)
Total Bilirubin: 0.9 mg/dL (ref 0.3–1.2)
Total Protein: 6.6 g/dL (ref 6.5–8.1)

## 2021-11-23 LAB — RESPIRATORY PANEL BY PCR

## 2021-11-23 LAB — CBC WITH DIFFERENTIAL/PLATELET
Abs Immature Granulocytes: 0.02 10*3/uL (ref 0.00–0.07)
Basophils Absolute: 0 10*3/uL (ref 0.0–0.1)
Basophils Relative: 1 %
Eosinophils Absolute: 0.4 10*3/uL (ref 0.0–0.5)
Eosinophils Relative: 5 %
HCT: 38.6 % (ref 36.0–46.0)
Hemoglobin: 12.7 g/dL (ref 12.0–15.0)
Immature Granulocytes: 0 %
Lymphocytes Relative: 28 %
Lymphs Abs: 2 10*3/uL (ref 0.7–4.0)
MCH: 30.1 pg (ref 26.0–34.0)
MCHC: 32.9 g/dL (ref 30.0–36.0)
MCV: 91.5 fL (ref 80.0–100.0)
Monocytes Absolute: 0.8 10*3/uL (ref 0.1–1.0)
Monocytes Relative: 11 %
Neutro Abs: 3.9 10*3/uL (ref 1.7–7.7)
Neutrophils Relative %: 55 %
Platelets: 274 10*3/uL (ref 150–400)
RBC: 4.22 MIL/uL (ref 3.87–5.11)
RDW: 14.6 % (ref 11.5–15.5)
WBC: 7.1 10*3/uL (ref 4.0–10.5)
nRBC: 0 % (ref 0.0–0.2)

## 2021-11-23 LAB — URINALYSIS, ROUTINE W REFLEX MICROSCOPIC
Bilirubin Urine: NEGATIVE
Glucose, UA: NEGATIVE mg/dL
Hgb urine dipstick: NEGATIVE
Ketones, ur: NEGATIVE mg/dL
Nitrite: NEGATIVE
Protein, ur: NEGATIVE mg/dL
Specific Gravity, Urine: 1.015 (ref 1.005–1.030)
pH: 7 (ref 5.0–8.0)

## 2021-11-23 LAB — GLUCOSE, CAPILLARY: Glucose-Capillary: 114 mg/dL — ABNORMAL HIGH (ref 70–99)

## 2021-11-23 LAB — URINALYSIS, MICROSCOPIC (REFLEX)

## 2021-11-23 LAB — SARS CORONAVIRUS 2 BY RT PCR: SARS Coronavirus 2 by RT PCR: NEGATIVE

## 2021-11-23 LAB — BRAIN NATRIURETIC PEPTIDE: B Natriuretic Peptide: 166.5 pg/mL — ABNORMAL HIGH (ref 0.0–100.0)

## 2021-11-23 MED ORDER — ACETAMINOPHEN 650 MG RE SUPP: 650.0000 mg | Freq: Four times a day (QID) | RECTAL | Status: DC | PRN

## 2021-11-23 MED ORDER — ONDANSETRON 4 MG PO TBDP
4.0000 mg | ORAL_TABLET | Freq: Three times a day (TID) | ORAL | Status: DC | PRN
  Administered 2021-11-24 – 2021-11-26 (×3): 4 mg via ORAL
  Filled 2021-11-23 (×3): qty 1

## 2021-11-23 MED ORDER — FUROSEMIDE 10 MG/ML IJ SOLN
20.0000 mg | Freq: Two times a day (BID) | INTRAMUSCULAR | Status: DC
  Administered 2021-11-24 – 2021-11-25 (×3): 20 mg via INTRAVENOUS
  Filled 2021-11-23 (×3): qty 2

## 2021-11-23 MED ORDER — FENTANYL CITRATE PF 50 MCG/ML IJ SOSY
50.0000 ug | PREFILLED_SYRINGE | Freq: Once | INTRAMUSCULAR | Status: AC
  Administered 2021-11-23: 50 ug via INTRAVENOUS
  Filled 2021-11-23: qty 1

## 2021-11-23 MED ORDER — MELATONIN 5 MG PO TABS: 5.0000 mg | ORAL_TABLET | Freq: Every evening | ORAL | Status: DC | PRN

## 2021-11-23 MED ORDER — FUROSEMIDE 10 MG/ML IJ SOLN
40.0000 mg | Freq: Once | INTRAMUSCULAR | Status: AC
  Administered 2021-11-23: 40 mg via INTRAVENOUS
  Filled 2021-11-23: qty 4

## 2021-11-23 MED ORDER — INSULIN ASPART 100 UNIT/ML IJ SOLN: 0.0000 [IU] | Freq: Every day | INTRAMUSCULAR | Status: DC

## 2021-11-23 MED ORDER — ACETAMINOPHEN 325 MG PO TABS
650.0000 mg | ORAL_TABLET | Freq: Four times a day (QID) | ORAL | Status: DC | PRN
  Administered 2021-11-23 – 2021-11-25 (×2): 650 mg via ORAL
  Filled 2021-11-23 (×2): qty 2

## 2021-11-23 MED ORDER — PANTOPRAZOLE SODIUM 40 MG PO TBEC
40.0000 mg | DELAYED_RELEASE_TABLET | Freq: Every day | ORAL | Status: DC
  Administered 2021-11-24 – 2021-11-26 (×3): 40 mg via ORAL
  Filled 2021-11-23 (×3): qty 1

## 2021-11-23 MED ORDER — INSULIN ASPART 100 UNIT/ML IJ SOLN
0.0000 [IU] | Freq: Three times a day (TID) | INTRAMUSCULAR | Status: DC
  Administered 2021-11-24 – 2021-11-25 (×2): 1 [IU] via SUBCUTANEOUS

## 2021-11-23 NOTE — Progress Notes (Signed)
In regards to pt's ordered MRI. Was able to obtain limited study (AX DWI, COR DWI, AX FLAIR, AX SWI) before pt squeezed provided emergency ball yelling to get out. Removed pt from scanner and pt stated "I'm sorry but I just cannot do this". Pt refused to continue exam. Sent obtained images and completed exam as a limited study.

## 2021-11-23 NOTE — Plan of Care (Signed)
Danielle [name] is a 76 y/o female with pmh T2DM, HTN, HLD, hypothyroidism, depression/anxiety, OSA on CPAP, an  memory loss who presents with SOB and leg swelling. BNP 166.5. Chest xray noted cardiomegaly with diffuse b/l opacities with concern for edema.  Patient has been given a dose of Lasix and likely needs an inpatient echocardiogram.  Hospitalized in October for falls, hallucinations, and insomia.  Plan had been for outpatient work-up with MRI, but had not been able to be done yet.  Likely needs to have MRI in the inpatient setting as patient psychiatric issues had not been improving.  Possible need for geriatric psychiatry evaluation.  Orders placed for inpatient to a cardiac telemetry bed.

## 2021-11-23 NOTE — H&P (Signed)
History and Physical    Danielle [name] ATF:[id number] DOB: 03-10-1945 DOA: 11/23/2021  PCP: [name], MD  Patient coming from: [facility]  Chief Complaint: Multiple complaints  HPI: Danielle [name] is a 76 y.o. female with medical history significant of CAD, cognitive decline/memory loss, depression/anxiety, type 2 diabetes, GERD, hyperlipidemia, hypertension, hypothyroidism, OSA on CPAP.  Recently admitted 10/16-10/24 frequent falls, lumbar compression fracture, AKI, UTI and hospital course complicated by delirium/hallucinations. PT OT had recommended SNF but family declined and she was discharged with home health.  She returns to the ED today for evaluation of shortness of breath, leg swelling, worsening anxiety/panic attacks/depression, delirium/hallucinations, insomnia, poor appetite.  Family is having difficulty taking care of her at home.  In the ED, vital signs stable.  Labs showing BNP 166.  UA with negative nitrite, small amount of leukocytes, and microscopy showing 6-10 WBCs and rare bacteria.  Chest x-ray showing cardiomegaly with mild diffuse bilateral interstitial opacity consistent with edema or atypical/viral infection; no focal airspace opacity. Patient was given fentanyl and IV Lasix 40 mg.  Transferred to [facility].  Patient is currently AAO x4 and answering questions appropriately except slightly slow to respond.  She reports having problems with her memory for several months for which she is being seen by neurology and is supposed to have a brain MRI done which has not been scheduled yet.  Reports history of depression since she was a teenager and her main concern is that her anxiety has gotten worse lately and she is having a lot of panic attacks.  For the past few days she has noticed that both of her legs are swollen.  She feels winded just walking to her mailbox at home.  Denies chest pain.  She reports low-grade fevers with temperature around 100 F  and cough.  Denies recent COVID exposures.  She is vaccinated against COVID and has received 1 booster.  She is reporting ongoing nausea and poor appetite.  Occasional dry heaves but no vomiting.  She was previously constipated for which she took an over-the-counter laxative after which she started having loose stools/diarrhea which has now improved since she stopped taking the laxative.  Denies abdominal pain.  She reports history of recent UTI with improvement of symptoms initially but then symptoms started again.  Endorsing dysuria.  Review of Systems:  Review of Systems  All other systems reviewed and are negative.   Past Medical History:  Diagnosis Date   Arthritis 02/13/2011   osteoarthritis, hips, knees,s/p Cervical fusion(DDD)   Atherosclerosis of aorta ([place]) 05/30/2015   on CTA chest   CAD (coronary artery disease)    a. 09/2016: Coronary CT showing mid-LAD plaque with 20-50% associated stenosis but no significant stenosis by FFR analysis.    Dementia ([place])    Depression with anxiety    Diabetes mellitus without complication ([place])    Difficult intubation 02/13/2011   with surgery -multiple times, no problems with recent surgeries   GERD (gastroesophageal reflux disease) 02/13/2011   tx. Omeprazole   Glaucoma 02/13/2011   bil. tx. eye drops daily   Headache(784.0) 02/13/2011   past hx. migraines, none recent   Heart murmur 02/13/2011   was told-no issues with this   Hypercholesterolemia    Hypertension 02/13/2011   Hypothyroidism 02/13/2011   tx. with Levothyroxine   Major depressive disorder    Mitral regurgitation 06/01/2015   Mild   Sinus drainage 02/13/2011   uses Zyrtec as needed   Sleep apnea  02/13/2011   Hx. sleep apnea-uses cpap since 10'12 nightly    Past Surgical History:  Procedure Laterality Date   ABDOMINAL HYSTERECTOMY  02/13/2011   APPENDECTOMY  02/13/2011   Lap. removal '92   BREAST CYST EXCISION Left    CERVICAL FUSION  02/13/2011   '92-  retained hardware   CHOLECYSTECTOMY  02/13/2011   '98-lap. galbladder removal due to stones   GUM SURGERY  02/13/2011   "small mouth"   JOINT REPLACEMENT  02/13/2011   RTHA-a yr ago in [place] Left 08/15/2019   Procedure: EXCISION OF SEBACEOUS CYST LEFT BREAST;  Surgeon: Georganna Skeans, MD;  Location: [facility];  Service: General;  Laterality: Left;   TONSILLECTOMY  02/13/2011   child   TOTAL HIP ARTHROPLASTY  02/17/2011   Procedure: TOTAL HIP ARTHROPLASTY ANTERIOR APPROACH;  Surgeon: Mauri Pole, MD;  Location: [facility];  Service: Orthopedics;  Laterality: Left;   TUBAL LIGATION  02/13/2011     reports that she has never smoked. She has never used smokeless tobacco. She reports current alcohol use. She reports that she does not use drugs.  Allergies  Allergen Reactions   Ceftin [Cefuroxime Axetil] Hives, Swelling and Other (See Comments)    Swelling of face   Codeine Nausea Only   Compazine [Prochlorperazine] Swelling and Other (See Comments)    Face and tongue swelling    Family History  Problem Relation Age of Onset   Cancer Mother        lung   Breast cancer Maternal Aunt    Alzheimer's disease Neg Hx    Dementia Neg Hx     Prior to Admission medications   Medication Sig Start Date End Date Taking? Authorizing Provider  BIOTIN PO Take 5,000 mcg by mouth.    [provider]  CALCIUM-VITAMIN D PO Take 1 capsule by mouth daily. 1200-1000 calcium-vitamin d    [provider]  fluticasone (FLONASE) 50 MCG/ACT nasal spray Place 1 spray into both nostrils daily as needed for allergies or rhinitis.    [provider]  hydrOXYzine (ATARAX) 25 MG tablet Take 1 tablet (25 mg total) by mouth every 6 (six) hours as needed for anxiety. 07/17/21   Gareth Morgan, MD  lamoTRIgine (LAMICTAL) 200 MG tablet Take 200 mg by mouth at bedtime. 10/13/21   [provider]  levothyroxine (SYNTHROID, LEVOTHROID) 137 MCG tablet  Take 137 mcg by mouth daily before breakfast.    [provider]  Magnesium 100 MG CAPS Take 100 mg by mouth daily.    [provider]  metFORMIN (GLUCOPHAGE-XR) 500 MG 24 hr tablet Take 500 mg by mouth 2 (two) times daily with a meal.    [provider]  metoprolol tartrate (LOPRESSOR) 25 MG tablet TAKE 1/2 TO 1 TABLET IN THE MORNING AND AT BEDTIME FOR PALPITATIONS. NEED APPOINTMENT FOR FURTHER REFILLS Patient taking differently: Take 12.5-25 mg by mouth in the morning and at bedtime. 03/03/21   Skeet Latch, MD  mirtazapine (REMERON) 15 MG tablet Take 1 tablet (15 mg total) by mouth at bedtime. 11/06/21   Elwanda Brooklyn, NP  OLANZapine (ZYPREXA) 10 MG tablet Take 1 tablet (10 mg total) by mouth at bedtime. 11/06/21   Elwanda Brooklyn, NP  OLANZapine (ZYPREXA) 2.5 MG tablet Take one tablet 2.5 mg for 7 days, then take two tablets 5 mg at bedtime daily. Patient taking differently: Take 5 mg by mouth at bedtime. 10/09/21   White,  Louanna Raw, NP  ondansetron (ZOFRAN) 4 MG tablet Take 4 mg by mouth every 8 (eight) hours as needed for nausea or vomiting. 09/23/21   [provider]  rosuvastatin (CRESTOR) 40 MG tablet Take 1 tablet (40 mg total) by mouth daily. 08/18/17   Skeet Latch, MD  sertraline (ZOLOFT) 50 MG tablet Take 1 tablet (50 mg total) by mouth daily. Take 1/2 tablet 25 mg for 7 days, then take one whole tablet 50 mg total daily after breakfast Patient taking differently: Take 50 mg by mouth daily before breakfast. 10/09/21   Elwanda Brooklyn, NP  Vitamin D, Ergocalciferol, (DRISDOL) 1.25 MG (50000 UNIT) CAPS capsule Take 50,000 Units by mouth every Wednesday. 07/01/21   [provider]    Physical Exam: Vitals:   11/23/21 1730 11/23/21 1745 11/23/21 1845 11/23/21 1857  BP: (!) 171/78 106/88 138/70   Pulse: 82 81 71   Resp: 20 (!) 23 13   Temp:    98.3 F (36.8 C)  TempSrc:    Oral  SpO2: 94% 97% 96%   Weight:      Height:         Physical Exam Vitals reviewed.  Constitutional:      General: She is not in acute distress. HENT:     Head: Normocephalic and atraumatic.  Eyes:     Extraocular Movements: Extraocular movements intact.  Cardiovascular:     Rate and Rhythm: Normal rate and regular rhythm.     Pulses: Normal pulses.  Pulmonary:     Effort: Pulmonary effort is normal. No respiratory distress.     Breath sounds: No wheezing.  Abdominal:     General: Bowel sounds are normal. There is no distension.     Palpations: Abdomen is soft.     Tenderness: There is no abdominal tenderness. There is no guarding or rebound.  Musculoskeletal:     Cervical back: Normal range of motion.     Right lower leg: Edema present.     Left lower leg: Edema present.     Comments: +3 pitting edema of bilateral lower extremities  Skin:    General: Skin is warm and dry.  Neurological:     General: No focal deficit present.     Mental Status: She is alert and oriented to person, place, and time.     Cranial Nerves: No cranial nerve deficit.     Sensory: No sensory deficit.     Motor: No weakness.     Labs on Admission: I have personally reviewed following labs and imaging studies  CBC: Recent Labs  Lab 11/23/21 1602  WBC 7.1  NEUTROABS 3.9  HGB 12.7  HCT 38.6  MCV 91.5  PLT 630   Basic Metabolic Panel: Recent Labs  Lab 11/23/21 1602  NA 143  K 3.7  CL 111  CO2 25  GLUCOSE 108*  BUN 7*  CREATININE 1.01*  CALCIUM 8.8*   GFR: Estimated Creatinine Clearance: 51.4 mL/min (A) (by C-G formula based on SCr of 1.01 mg/dL (H)). Liver Function Tests: Recent Labs  Lab 11/23/21 1602  AST 23  ALT 19  ALKPHOS 105  BILITOT 0.9  PROT 6.6  ALBUMIN 3.6   No results for input(s): "LIPASE", "AMYLASE" in the last 168 hours. No results for input(s): "AMMONIA" in the last 168 hours. Coagulation Profile: No results for input(s): "INR", "PROTIME" in the last 168 hours. Cardiac Enzymes: No results for  input(s): "CKTOTAL", "CKMB", "CKMBINDEX", "TROPONINI" in the last 168 hours.  BNP (last 3 results) No results for input(s): "PROBNP" in the last 8760 hours. HbA1C: No results for input(s): "HGBA1C" in the last 72 hours. CBG: No results for input(s): "GLUCAP" in the last 168 hours. Lipid Profile: No results for input(s): "CHOL", "HDL", "LDLCALC", "TRIG", "CHOLHDL", "LDLDIRECT" in the last 72 hours. Thyroid Function Tests: No results for input(s): "TSH", "T4TOTAL", "FREET4", "T3FREE", "THYROIDAB" in the last 72 hours. Anemia Panel: No results for input(s): "VITAMINB12", "FOLATE", "FERRITIN", "TIBC", "IRON", "RETICCTPCT" in the last 72 hours. Urine analysis:    Component Value Date/Time   COLORURINE YELLOW 11/23/2021 1625   APPEARANCEUR CLEAR 11/23/2021 1625   LABSPEC 1.015 11/23/2021 1625   PHURINE 7.0 11/23/2021 1625   GLUCOSEU NEGATIVE 11/23/2021 1625   HGBUR NEGATIVE 11/23/2021 1625   BILIRUBINUR NEGATIVE 11/23/2021 1625   KETONESUR NEGATIVE 11/23/2021 1625   PROTEINUR NEGATIVE 11/23/2021 1625   UROBILINOGEN 0.2 02/13/2011 1018   NITRITE NEGATIVE 11/23/2021 1625   LEUKOCYTESUR SMALL (A) 11/23/2021 1625    Radiological Exams on Admission: DG Chest 2 View  Result Date: 11/23/2021 CLINICAL DATA:  Shortness of breath EXAM: CHEST - 2 VIEW COMPARISON:  10/27/2021 FINDINGS: Cardiomegaly. Mild, diffuse bilateral interstitial opacity. Disc degenerative disease of the thoracic spine. IMPRESSION: Cardiomegaly with mild, diffuse bilateral interstitial opacity, consistent with edema or atypical/viral infection. No focal airspace opacity. Electronically Signed   By: Delanna Ahmadi M.D.   On: 11/23/2021 17:19    EKG: Independently reviewed.  Sinus rhythm, RBBB.  No significant change since prior tracing.  Assessment and Plan  Suspected new onset CHF Patient is endorsing dyspnea with exertion.  She has +3 pitting edema of bilateral lower extremities.  BNP 166.  Chest x-ray showing  cardiomegaly with mild diffuse bilateral interstitial opacity consistent with edema or atypical/viral infection; no focal airspace opacity.  Echo done 09/2016 showing normal LVEF and diastolic function parameters normal. Not hypoxic.  No fever or leukocytosis.  -Continue diuresis with IV Lasix 20 mg twice daily. -Echocardiogram ordered -Monitor intake and output -Daily weights -Monitor BMP -Low-sodium diet with fluid restriction -SARS-CoV-2 PCR -Respiratory viral panel -Check procalcitonin  Cognitive decline/memory loss/delirium Worsening anxiety/panic attacks History of depression Last neurology office visit 10/5 and formal neurocognitive testing was not consistent with neurodegenerative disease.  Plan was to obtain a brain MRI for further evaluation which has not been done yet.  Neurology had felt that the most important variable that needed to be addressed was her significant increase in panic attacks and anxiety symptoms. -Continue home meds after pharmacy med rec is done. -Brain MRI ordered -Psychiatry consulted -Delirium precautions -Family is having difficulty taking care of her at home.  PT/OT and TOC consulted for placement. -Fall precautions  Dysuria/ ?UTI UA with negative nitrite, small amount of leukocytes, and microscopy showing 6-10 WBCs and rare bacteria.  No fever, leukocytosis, or signs of sepsis. -Urine culture  Nausea without vomiting Patient is not endorsing abdominal pain.  Constipation resolved with over-the-counter laxatives and is having bowel movements.  Abdominal exam benign.  LFTs normal. ?GERD/ dyspepsia. -Start Protonix 40 mg daily -Antiemetic as needed  Non-insulin-dependent type 2 diabetes A1c 5.7 on 10/28/2021. -Sensitive sliding scale insulin ACHS  OSA -Continue nightly CPAP  CAD: Not endorsing chest pain and EKG without acute ischemic changes. Hyperlipidemia Hypertension: Stable. Hypothyroidism: Check TSH. -Pharmacy med rec pending.  DVT  prophylaxis: SCDs Code Status: Full Code (discussed with the patient) Family Communication: No family available at this time. Consults called: Psychiatry Level of care: Telemetry bed Admission  status: It is my clinical opinion that admission to INPATIENT is reasonable and necessary because of the expectation that this patient will require hospital care that crosses at least 2 midnights to treat this condition based on the medical complexity of the problems presented.  Given the aforementioned information, the predictability of an adverse outcome is felt to be significant.   Shela Leff MD [facility]  If 7PM-7AM, please contact night-coverage [email or web address]  11/23/2021, 8:43 PM

## 2021-11-23 NOTE — ED Provider Notes (Signed)
[facility] EMERGENCY DEPARTMENT Provider Note   CSN: [id number] Arrival date & time: 11/23/21  1453     History {Add pertinent medical, surgical, social history, OB history to HPI:1} Chief Complaint  Patient presents with   Leg Swelling   Back Pain    Danielle [name] is a 76 y.o. female.  HPI      76yo female with history of DM, htn, hlpd, hypothyroidism, depression/anxiety, OSA on CPAP, memory loss, recent admission 10/16-10/20 for falls, AKI, lumbar compression fracture with hospital course complicated by delirium, hallucinations, PT/OT recommending SNF however family preferred home care who presents with concern for family having difficulty taking care of her at home.   Nausea For the last 4.5 months [facility], going through UTI, several weeks, seeing and hearing things Medication for anxiety/sleep, last time saw her he doubled it. Not working. Right now she is a wreck.   Up all night long, going on for quite some time Feels hungry then takes 1-2 bites and feels sick Has no hope, wants to die July is when it started with panic attacks, scared, anxiety/derpession, visual and auditory hallucinations Swelling in feet and legs, 3 days ago were worse than now Won't lay back and elevate them Constantly walks the floors at night, delirious Walking floors at night Can use phone better than she did but having a hard time Will not relax [facility] provider stopped lorazepam No hx of etoh use      3-4 days leg swelling  BP has been steady in 160s No chest pain now, did have palpitations before No dyspnea, did have bronchitis but it got better No headaches  Back pain with fractures, severe pain    Past Medical History:  Diagnosis Date   Arthritis 02/13/2011   osteoarthritis, hips, knees,s/p Cervical fusion(DDD)   Atherosclerosis of aorta ([place]) 05/30/2015   on CTA chest   CAD (coronary artery disease)    a. 09/2016: Coronary CT showing  mid-LAD plaque with 20-50% associated stenosis but no significant stenosis by FFR analysis.    Dementia ([place])    Depression with anxiety    Diabetes mellitus without complication ([place])    Difficult intubation 02/13/2011   with surgery -multiple times, no problems with recent surgeries   GERD (gastroesophageal reflux disease) 02/13/2011   tx. Omeprazole   Glaucoma 02/13/2011   bil. tx. eye drops daily   Headache(784.0) 02/13/2011   past hx. migraines, none recent   Heart murmur 02/13/2011   was told-no issues with this   Hypercholesterolemia    Hypertension 02/13/2011   Hypothyroidism 02/13/2011   tx. with Levothyroxine   Major depressive disorder    Mitral regurgitation 06/01/2015   Mild   Sinus drainage 02/13/2011   uses Zyrtec as needed   Sleep apnea 02/13/2011   Hx. sleep apnea-uses cpap since 10'12 nightly    Home Medications Prior to Admission medications   Medication Sig Start Date End Date Taking? Authorizing Provider  BIOTIN PO Take 5,000 mcg by mouth.    [provider]  CALCIUM-VITAMIN D PO Take 1 capsule by mouth daily. 1200-1000 calcium-vitamin d    [provider]  fluticasone (FLONASE) 50 MCG/ACT nasal spray Place 1 spray into both nostrils daily as needed for allergies or rhinitis.    [provider]  hydrOXYzine (ATARAX) 25 MG tablet Take 1 tablet (25 mg total) by mouth every 6 (six) hours as needed for anxiety. 07/17/21   Gareth Morgan, MD  lamoTRIgine (LAMICTAL)  200 MG tablet Take 200 mg by mouth at bedtime. 10/13/21   [provider]  levothyroxine (SYNTHROID, LEVOTHROID) 137 MCG tablet Take 137 mcg by mouth daily before breakfast.    [provider]  Magnesium 100 MG CAPS Take 100 mg by mouth daily.    [provider]  metFORMIN (GLUCOPHAGE-XR) 500 MG 24 hr tablet Take 500 mg by mouth 2 (two) times daily with a meal.    [provider]  metoprolol tartrate (LOPRESSOR) 25 MG tablet TAKE 1/2 TO 1  TABLET IN THE MORNING AND AT BEDTIME FOR PALPITATIONS. NEED APPOINTMENT FOR FURTHER REFILLS Patient taking differently: Take 12.5-25 mg by mouth in the morning and at bedtime. 03/03/21   Skeet Latch, MD  mirtazapine (REMERON) 15 MG tablet Take 1 tablet (15 mg total) by mouth at bedtime. 11/06/21   Elwanda Brooklyn, NP  OLANZapine (ZYPREXA) 10 MG tablet Take 1 tablet (10 mg total) by mouth at bedtime. 11/06/21   Elwanda Brooklyn, NP  OLANZapine (ZYPREXA) 2.5 MG tablet Take one tablet 2.5 mg for 7 days, then take two tablets 5 mg at bedtime daily. Patient taking differently: Take 5 mg by mouth at bedtime. 10/09/21   Elwanda Brooklyn, NP  ondansetron (ZOFRAN) 4 MG tablet Take 4 mg by mouth every 8 (eight) hours as needed for nausea or vomiting. 09/23/21   [provider]  rosuvastatin (CRESTOR) 40 MG tablet Take 1 tablet (40 mg total) by mouth daily. 08/18/17   Skeet Latch, MD  sertraline (ZOLOFT) 50 MG tablet Take 1 tablet (50 mg total) by mouth daily. Take 1/2 tablet 25 mg for 7 days, then take one whole tablet 50 mg total daily after breakfast Patient taking differently: Take 50 mg by mouth daily before breakfast. 10/09/21   Elwanda Brooklyn, NP  Vitamin D, Ergocalciferol, (DRISDOL) 1.25 MG (50000 UNIT) CAPS capsule Take 50,000 Units by mouth every Wednesday. 07/01/21   [provider]      Allergies    Ceftin [cefuroxime axetil], Codeine, and Compazine [prochlorperazine]    Review of Systems   Review of Systems  Physical Exam Updated Vital Signs BP (!) 140/77   Pulse 73   Temp 98.5 F (36.9 C) (Oral)   Resp 18   Ht '5\' 3"'$  (1.6 m)   Wt 90.7 kg   SpO2 99%   BMI 35.43 kg/m  Physical Exam  ED Results / Procedures / Treatments   Labs (all labs ordered are listed, but only abnormal results are displayed) Labs Reviewed - No data to display  EKG None  Radiology No results found.  Procedures Procedures  {Document cardiac monitor, telemetry assessment procedure when  appropriate:1}  Medications Ordered in ED Medications - No data to display  ED Course/ Medical Decision Making/ A&P                           Medical Decision Making Amount and/or Complexity of Data Reviewed Labs: ordered.   ***  {Document critical care time when appropriate:1} {Document review of labs and clinical decision tools ie heart score, Chads2Vasc2 etc:1}  {Document your independent review of radiology images, and any outside records:1} {Document your discussion with family members, caretakers, and with consultants:1} {Document social determinants of health affecting pt's care:1} {Document your decision making why or why not admission, treatments were needed:1} Final Clinical Impression(s) / ED Diagnoses Final diagnoses:  None    Rx / DC Orders ED Discharge  Orders     None

## 2021-11-23 NOTE — ED Triage Notes (Signed)
Patient accompanied by husband; present with multiple complaints. Reported recent dx of major depressive disorder and husband is having difficulty with care management and patient's mental state. Patient c/o back pain w/ hx of fracture; c/o ankle swelling as well.

## 2021-11-23 NOTE — ED Notes (Signed)
Patient states that she cannot void

## 2021-11-23 NOTE — ED Notes (Signed)
Commode placed at beside.

## 2021-11-23 NOTE — ED Notes (Signed)
Both legs are swelling for three days.Reports nausea for several months.Has medication for nausea with no relief. Under care with brain  white [place].Full movement in both legs Patient can bear weight also

## 2021-11-24 ENCOUNTER — Inpatient Hospital Stay (HOSPITAL_BASED_OUTPATIENT_CLINIC_OR_DEPARTMENT_OTHER): Payer: Medicare HMO

## 2021-11-24 DIAGNOSIS — I5021 Acute systolic (congestive) heart failure: Secondary | ICD-10-CM | POA: Diagnosis not present

## 2021-11-24 DIAGNOSIS — R4189 Other symptoms and signs involving cognitive functions and awareness: Secondary | ICD-10-CM | POA: Diagnosis not present

## 2021-11-24 DIAGNOSIS — I509 Heart failure, unspecified: Secondary | ICD-10-CM | POA: Diagnosis not present

## 2021-11-24 DIAGNOSIS — R0609 Other forms of dyspnea: Secondary | ICD-10-CM | POA: Diagnosis not present

## 2021-11-24 LAB — ECHOCARDIOGRAM COMPLETE
Area-P 1/2: 4.31 cm2
Height: 63 in
S' Lateral: 3.7 cm
Weight: 3220.48 oz

## 2021-11-24 LAB — BASIC METABOLIC PANEL
Anion gap: 7 (ref 5–15)
BUN: 6 mg/dL — ABNORMAL LOW (ref 8–23)
CO2: 24 mmol/L (ref 22–32)
Calcium: 9.2 mg/dL (ref 8.9–10.3)
Chloride: 113 mmol/L — ABNORMAL HIGH (ref 98–111)
Creatinine, Ser: 1.02 mg/dL — ABNORMAL HIGH (ref 0.44–1.00)
GFR, Estimated: 57 mL/min — ABNORMAL LOW (ref >60)
Glucose, Bld: 106 mg/dL — ABNORMAL HIGH (ref 70–99)
Potassium: 3.7 mmol/L (ref 3.5–5.1)
Sodium: 144 mmol/L (ref 135–145)

## 2021-11-24 LAB — PROCALCITONIN: Procalcitonin: <0.1 ng/mL

## 2021-11-24 LAB — GLUCOSE, CAPILLARY
Glucose-Capillary: 117 mg/dL — ABNORMAL HIGH (ref 70–99)
Glucose-Capillary: 129 mg/dL — ABNORMAL HIGH (ref 70–99)
Glucose-Capillary: 107 mg/dL — ABNORMAL HIGH (ref 70–99)
Glucose-Capillary: 121 mg/dL — ABNORMAL HIGH (ref 70–99)

## 2021-11-24 LAB — TSH: TSH: 2.543 u[IU]/mL (ref 0.350–4.500)

## 2021-11-24 MED ORDER — LORAZEPAM 2 MG/ML IJ SOLN
2.0000 mg | Freq: Four times a day (QID) | INTRAMUSCULAR | Status: DC | PRN
  Administered 2021-11-25: 2 mg via INTRAVENOUS
  Filled 2021-11-24: qty 1

## 2021-11-24 MED ORDER — SERTRALINE HCL 50 MG PO TABS
50.0000 mg | ORAL_TABLET | Freq: Every day | ORAL | Status: DC
  Administered 2021-11-25 – 2021-11-26 (×2): 50 mg via ORAL
  Filled 2021-11-24 (×2): qty 1

## 2021-11-24 MED ORDER — OXYCODONE HCL 5 MG PO TABS
5.0000 mg | ORAL_TABLET | ORAL | Status: DC | PRN
  Administered 2021-11-24 – 2021-11-26 (×3): 5 mg via ORAL
  Filled 2021-11-24 (×3): qty 1

## 2021-11-24 MED ORDER — MIRTAZAPINE 15 MG PO TABS
15.0000 mg | ORAL_TABLET | Freq: Every day | ORAL | Status: DC
  Administered 2021-11-24 – 2021-11-25 (×2): 15 mg via ORAL
  Filled 2021-11-24 (×2): qty 1

## 2021-11-24 MED ORDER — OLANZAPINE 10 MG PO TABS
10.0000 mg | ORAL_TABLET | Freq: Every day | ORAL | Status: DC
  Filled 2021-11-24: qty 1

## 2021-11-24 MED ORDER — METOPROLOL TARTRATE 12.5 MG HALF TABLET
12.5000 mg | ORAL_TABLET | Freq: Two times a day (BID) | ORAL | Status: DC
  Administered 2021-11-24 – 2021-11-26 (×4): 12.5 mg via ORAL
  Filled 2021-11-24 (×4): qty 1

## 2021-11-24 MED ORDER — OLANZAPINE 5 MG PO TABS
5.0000 mg | ORAL_TABLET | Freq: Every day | ORAL | Status: DC
  Administered 2021-11-24 – 2021-11-25 (×2): 5 mg via ORAL
  Filled 2021-11-24 (×3): qty 1

## 2021-11-24 MED ORDER — LEVOTHYROXINE SODIUM 25 MCG PO TABS
137.0000 ug | ORAL_TABLET | Freq: Every day | ORAL | Status: DC
  Administered 2021-11-25 – 2021-11-26 (×2): 137 ug via ORAL
  Filled 2021-11-24 (×2): qty 1

## 2021-11-24 MED ORDER — PERFLUTREN LIPID MICROSPHERE
1.0000 mL | INTRAVENOUS | Status: AC | PRN
  Administered 2021-11-24: 2 mL via INTRAVENOUS

## 2021-11-24 NOTE — Progress Notes (Signed)
Admitted from [facility] via [facility] to [place] [facility].Alert and oriented,CHG wipes done,V/S checked and attached to continous cardiac monitoring .CCMD notified.Patient oriented to the staff and room and admitting provider notified.Will continue to monitor.

## 2021-11-24 NOTE — Evaluation (Signed)
Occupational Therapy Evaluation Patient Details Name: Danielle [name] MRN: [id number] DOB: 1945-07-25 Today's Date: 11/24/2021   History of Present Illness Pt is a 76 y/o female presenting on 11/12 with SOB, LE swelling, worsening panic attacks. Noted recent admission 10/16-24 with frequent falls, lumbar compression fx with SNF recommended but dc'd home. Admitted for new onset CHF.  PMH includes: arthritis, CAD, dementia, depression and anxiety, glaucoma, HTN, L THA.   Clinical Impression   PTA patient reports using cane vs RW for mobility at home, needing assist from spouse for LB ADLs. Admitted for above and presents with problem list below, including impaired balance, generalized weakness, anxiety, decreased activity tolerance, and impaired cognition.  Patient currently requires supervision for transfers and min guard using RW for mobility in room, setup for UB Adls and up to mod assist for LB ADLs.  Believe pt will best benefit from continued OT services acutely and after dc at [facility] level with 24/7 support recommended due to cognition, to optimize independence, safety and return to PLOF. Will follow acutely.      Recommendations for follow up therapy are one component of a multi-disciplinary discharge planning process, led by the attending physician.  Recommendations may be updated based on patient status, additional functional criteria and insurance authorization.   Follow Up Recommendations  Home health OT     Assistance Recommended at Discharge Frequent or constant Supervision/Assistance  Patient can return home with the following A little help with walking and/or transfers;A lot of help with bathing/dressing/bathroom;Assistance with cooking/housework;Direct supervision/assist for medications management;Direct supervision/assist for financial management;Assist for transportation;Help with stairs or ramp for entrance    Functional Status Assessment  Patient has had a recent decline  in their functional status and demonstrates the ability to make significant improvements in function in a reasonable and predictable amount of time.  Equipment Recommendations  BSC/3in1 (spouse checking if they already have one? may not need.)    Recommendations for Other Services       Precautions / Restrictions Precautions Precautions: Fall Precaution Comments: incontinent bladder Restrictions Weight Bearing Restrictions: No      Mobility Bed Mobility               General bed mobility comments: OOB in recliner upon entry    Transfers Overall transfer level: Needs assistance Equipment used: Rolling walker (2 wheels) Transfers: Sit to/from Stand Sit to Stand: Supervision           General transfer comment: for safety      Balance Overall balance assessment: Mild deficits observed, not formally tested                                         ADL either performed or assessed with clinical judgement   ADL Overall ADL's : Needs assistance/impaired     Grooming: Min guard;Standing           Upper Body Dressing : Set up;Sitting   Lower Body Dressing: Moderate assistance;Sit to/from stand   Toilet Transfer: Min guard;Ambulation;Rolling walker (2 wheels)   Toileting- Clothing Manipulation and Hygiene: Moderate assistance;Sit to/from stand Toileting - Clothing Manipulation Details (indicate cue type and reason): pt incontinent of bladder, pt difficulty problem solving how to pull her underwear down to change     Functional mobility during ADLs: Min guard;Rolling walker (2 wheels) General ADL Comments: cueing to use RW, educated pt and spouse on  using RW at home     Vision   Vision Assessment?: No apparent visual deficits     Perception     Praxis      Pertinent Vitals/Pain Pain Assessment Pain Assessment: No/denies pain     Hand Dominance Left   Extremity/Trunk Assessment Upper Extremity Assessment Upper Extremity  Assessment: Generalized weakness   Lower Extremity Assessment Lower Extremity Assessment: Defer to PT evaluation       Communication Communication Communication: Expressive difficulties   Cognition Arousal/Alertness: Awake/alert Behavior During Therapy: Flat affect, Anxious Overall Cognitive Status: History of cognitive impairments - at baseline                                 General Comments: pt oriented and follows simple commands with increased time, poor recall of safety and using RW during functional tasks (stood at sink and started to walk away without RW), decreased awareness of safety and deficits- pt urinating mulitple times at sink after educated she was not attached to pure wick     General Comments  spouse present and supportive    Exercises     Shoulder Instructions      Home Living Family/patient expects to be discharged to:: Private residence Living Arrangements: Spouse/significant other Available Help at Discharge: Family Type of Home: House Home Access: Stairs to enter Technical brewer of Steps: 2   Home Layout: Two level;1/2 bath on main level Alternate Level Stairs-Number of Steps: flight- has chair lift   Bathroom Shower/Tub: Occupational psychologist: Standard     Home Equipment: Conservation officer, nature (2 wheels);Shower seat;Shower seat - built in;Cane - single point;Adaptive equipment (possible BSC?) Adaptive Equipment: Reacher        Prior Functioning/Environment Prior Level of Function : Needs assist             Mobility Comments: using cane vs RW. denies falls since prior admission, but has had 2 falls within past month ADLs Comments: pt spouse helping with LB dressing i.e. shoes, socks, pulling up pants        OT Problem List: Decreased strength;Decreased activity tolerance;Impaired balance (sitting and/or standing);Decreased safety awareness;Increased edema;Obesity;Decreased knowledge of use of DME or  AE;Decreased knowledge of precautions;Decreased cognition      OT Treatment/Interventions: Self-care/ADL training;Therapeutic exercise;DME and/or AE instruction;Therapeutic activities;Balance training;Patient/family education;Cognitive remediation/compensation;Energy conservation    OT Goals(Current goals can be found in the care plan section) Acute Rehab OT Goals Patient Stated Goal: home OT Goal Formulation: With patient Time For Goal Achievement: 12/08/21 Potential to Achieve Goals: Good  OT Frequency: Min 2X/week    Co-evaluation              AM-PAC OT "6 Clicks" Daily Activity     Outcome Measure Help from another person eating meals?: None Help from another person taking care of personal grooming?: A Little Help from another person toileting, which includes using toliet, bedpan, or urinal?: A Lot Help from another person bathing (including washing, rinsing, drying)?: A Lot Help from another person to put on and taking off regular upper body clothing?: A Little Help from another person to put on and taking off regular lower body clothing?: A Lot 6 Click Score: 16   End of Session Equipment Utilized During Treatment: Rolling walker (2 wheels) Nurse Communication: Mobility status  Activity Tolerance: Patient tolerated treatment well Patient left: in chair;with call bell/phone within reach;with chair alarm set;with family/visitor present  OT Visit Diagnosis: Other abnormalities of gait and mobility (R26.89);Muscle weakness (generalized) (M62.81);Other symptoms and signs involving cognitive function;History of falling (Z91.81)                Time: 2035-5974 OT Time Calculation (min): 26 min Charges:  OT General Charges $OT Visit: 1 Visit OT Evaluation $OT Eval Moderate Complexity: 1 Mod OT Treatments $Self Care/Home Management : 8-22 mins  Jolaine Artist, OT Acute Rehabilitation Services Office [phone]   Danielle [name] 11/24/2021, 10:06 AM

## 2021-11-24 NOTE — Consult Note (Signed)
[facility] New Face-to-Face Psychiatric Evaluation   Service Date: November 24, 2021 LOS:  LOS: 1 day    Assessment  Danielle [name] is a 76 year old female with a history of memory and cognitive issues not due to neurodegenerative process, major depressive disorder with psychotic features, and medical comorbidities such as obstructive sleep apnea and type 2 diabetes.  She was medically admitted on 11/12 for concern for new onset heart failure.  Psychiatry was consulted for "panic attacks, depression, insomnia, and delirium".  On assessment the patient demonstrates difficulty engaging with moderately challenging cognitive tasks (saying the days of the week backwards).  She appears dysphoric and upset at her inability to perform these tasks.  Problems with memory appear to be the patient's primary concern.  The primary concern of her husband is that she is not performing simple activities of daily living, such as dressing herself, and he is forced to perform these activities for her.  Previous neuropsychological evaluation notes are very helpful as are notes from her outpatient neurologist.  They concur on the fact that the patient is not experiencing dementia or any sort of organic neurodegenerative process.  They suggest that treating the patient's anxiety is the best path forward.  Appropriately, she has been referred to psychiatry and has been following with psychiatric nurse practitioners, most recently Danielle [name].  The regimen that she is currently on per the report of her husband and is noted in the NP's note is unlikely to be contributing to her cognitive difficulties.  Feel that the patient's cognitive issues, and her psychiatric symptoms more broadly, may be multifactorial in nature, related to major depressive disorder, prolonged grief from her dog dying in January, and some interpersonal behavioral component with her husband that is fostering dependency. Low concern for  delirium given no apparent fluctuation in mental status.  Adjustments to medication regimen as below.  Currently discussing utility of geriatric psychiatry inpatient admission with Dr. Louis Meckel.  No acute safety concerns, no need for one-to-one sitter.   Diagnoses:  Active Hospital problems: Principal Problem:   New onset of congestive heart failure (HCC) Active Problems:   OSA (obstructive sleep apnea)   Hypertension   Hyperlipidemia LDL goal <70   Hypothyroidism   Depression with anxiety   Cognitive decline   Delirium   Type 2 diabetes mellitus ([place])     Plan  ## Safety and Observation Level:  - Based on my clinical evaluation, I estimate the patient to be at low risk of self harm in the current setting - At this time, we recommend a routine level of observation. This decision is based on my review of the chart including patient's history and current presentation, interview of the patient, mental status examination, and consideration of suicide risk including evaluating suicidal ideation, plan, intent, suicidal or self-harm behaviors, risk factors, and protective factors. This judgment is based on our ability to directly address suicide risk, implement suicide prevention strategies and develop a safety plan while the patient is in the clinical setting. Please contact our team if there is a concern that risk level has changed.   ## Medications:  -- Continue home medication as ordered with the exception of: -- Decrease Zyprexa from 10 mg to 5 mg given that the medication is anticholinergic and 10 mg is a large dose for a person her age.   ## Medical Decision Making Capacity:  Not formally assessed  ## Further Work-up:  -- per primary -- most recent EKG on 11/12  had QtC of 472 -- Pertinent labwork reviewed earlier this admission includes: unremarkable, BNP 166, TSH nml  ## Disposition:  -- TBD  ## Behavioral / Environmental:  -- no need for 1:1  ##Legal  Status Voluntary  Thank you for this consult request. Recommendations have been communicated to the primary team.  We will follow at this time.   Corky Sox, MD   NEW history  Relevant Aspects of Hospital Course:  Admitted on 11/23/2021 for HF workup. 11/13 echo normal.   Patient Report:  Pt seen and oriented to self, situation, month and year. She does not know who the president is. She is unable to add a nickel, a dime, and a quarter (states 35 cents). Able to do DOWB from Friday --> Tuesday with some difficulty and needs to be prompted.  . She lives in [place] area. She lives with her husband of 7 years. They are both retired.   She brings up her memory troubles during the mental status exam. She has seen a psychiatrist but cannot remember the name - eventually comes up with Danielle [name]. She is also seeing Danielle [name]. She has trouble answering many basic questions.   Some trouble with autobiographical recall.  Some expressive aphasia - states it is "Two Oh Two Three" instead of 2023.   She brings up sometimes feeling like her deceased pets are around her and seeing their silhouettes. She finds this to be   She did not know that psychiatry was consulted and had no idea why we were consulted. Had to ask who psychiatry team was multiple times.   She expresses distress that she has difficulty answering questions. She feels like answers to a lot of questions are just barely out of view.   States she has been dealing with depression for most of her life. Thinks she has tried 4-5 different medications for depression. She thinks her sleep is a major issue.   She does not recognize the word "mirtazapine". Her husband has been managing her medications.   Appetite has been poor. Enjoys things but not as much as she would like to.  Hx passive but not active SI, no hx attempts.   Doesn't understand a lot of what is going on with her medical care.   Mentions being afraid of  being locked up. Some paranoia evidnet near end of exam "trying to find out who are the good ones and who are not the good ones".    Med fill history:  Lots of meds from multiple providers. Unclear why she is seeing 2 outpt providers (dates overlap).   Filled 90 day supply of pristiq 7/26 [name] Poulous) - close to active 30 day supply of fluoxetine 10/24 (Lisa Poulous) - active 30 pills of hydroxyzine 9/19 (Lisa Poulous) - not active Lamictal 200 mg filled 11/1 for 30 days (Lisa Poulous) - active Lorazepam 1 mg BID filled 9/6 [name] Poulous) - not active Mirtazapine 15 mg 30 day supply 10/26 Danielle [name]) - active Olanzapine 10 mg 30 day supply 10/26 Danielle [name]) - active Sertraline 50 mg 30 day supply 10/27 Danielle [name])  - active    From Danielle [name] (NP) telephone visit 10/23:  I spent over 90 minutes with them last visit reviewing medications with them. Two one gallon bag fulls of medication. They were confused and I did the best I could with what I had. I did not increase her Lamictal.  Their med list was a hot mess. I told them  she needs to have her non psych meds reviewed by her pcp. Lamictal is not causing the fall risk regardless. She needs to follow up with [facility] for her other meds.     Collateral information:  Patient's husband reports that they were married 5 years ago.  At the beginning of his marriage he states the patient was fully independent and a fun person.  He reports a few years ago the patient began experiencing mood problems and minor forgetfulness.  He reports that starting in January of this year they had to euthanize their dog.  He states the patient was very close to the dog.  Since that time the patient has been confused and scared.  He asked the patient frequently what she is scared of and she is unable to answer him.  More recently she has not been performing her activities of daily living and he has been dressing her.  He says that he is at his wits end and brought  her to the hospital because she has been asking to go for a while.  He denies any acute safety concerns.  He states that she has occasionally made statements about not wanting to live but that she has never harmed herself intentionally or attempted suicide.   Psychiatric History:  Information collected from chart review and husband Major depressive disorder with psychotic features, anxiety  Family psych history: None   Social History:  Tobacco use: Denies Alcohol use: Less than 1 time per week Drug use: Denies  Family History:  The patient's family history includes Breast cancer in her maternal aunt; Cancer in her mother.  Medical History: Past Medical History:  Diagnosis Date   Arthritis 02/13/2011   osteoarthritis, hips, knees,s/p Cervical fusion(DDD)   Atherosclerosis of aorta ([place]) 05/30/2015   on CTA chest   CAD (coronary artery disease)    a. 09/2016: Coronary CT showing mid-LAD plaque with 20-50% associated stenosis but no significant stenosis by FFR analysis.    Dementia ([place])    Depression with anxiety    Diabetes mellitus without complication ([place])    Difficult intubation 02/13/2011   with surgery -multiple times, no problems with recent surgeries   GERD (gastroesophageal reflux disease) 02/13/2011   tx. Omeprazole   Glaucoma 02/13/2011   bil. tx. eye drops daily   Headache(784.0) 02/13/2011   past hx. migraines, none recent   Heart murmur 02/13/2011   was told-no issues with this   Hypercholesterolemia    Hypertension 02/13/2011   Hypothyroidism 02/13/2011   tx. with Levothyroxine   Major depressive disorder    Mitral regurgitation 06/01/2015   Mild   Sinus drainage 02/13/2011   uses Zyrtec as needed   Sleep apnea 02/13/2011   Hx. sleep apnea-uses cpap since 10'12 nightly    Surgical History: Past Surgical History:  Procedure Laterality Date   ABDOMINAL HYSTERECTOMY  02/13/2011   APPENDECTOMY  02/13/2011   Lap. removal '92   BREAST CYST EXCISION  Left    CERVICAL FUSION  02/13/2011   '92- retained hardware   CHOLECYSTECTOMY  02/13/2011   '98-lap. galbladder removal due to stones   GUM SURGERY  02/13/2011   "small mouth"   JOINT REPLACEMENT  02/13/2011   RTHA-a yr ago in [place] Left 08/15/2019   Procedure: EXCISION OF SEBACEOUS CYST LEFT BREAST;  Surgeon: Georganna Skeans, MD;  Location: [facility];  Service: General;  Laterality: Left;   TONSILLECTOMY  02/13/2011   child   TOTAL  HIP ARTHROPLASTY  02/17/2011   Procedure: TOTAL HIP ARTHROPLASTY ANTERIOR APPROACH;  Surgeon: Mauri Pole, MD;  Location: [facility];  Service: Orthopedics;  Laterality: Left;   TUBAL LIGATION  02/13/2011    Medications:   Current Facility-Administered Medications:    acetaminophen (TYLENOL) tablet 650 mg, 650 mg, Oral, Q6H PRN, 650 mg at 11/23/21 2300 **OR** acetaminophen (TYLENOL) suppository 650 mg, 650 mg, Rectal, Q6H PRN, Shela Leff, MD   furosemide (LASIX) injection 20 mg, 20 mg, Intravenous, BID, Shela Leff, MD, 20 mg at 11/24/21 0841   insulin aspart (novoLOG) injection 0-5 Units, 0-5 Units, Subcutaneous, QHS, Rathore, Vasundhra, MD   insulin aspart (novoLOG) injection 0-9 Units, 0-9 Units, Subcutaneous, TID WC, Shela Leff, MD, 1 Units at 11/24/21 1215   [START ON 11/25/2021] levothyroxine (SYNTHROID) tablet 137 mcg, 137 mcg, Oral, QAC breakfast, Dahal, Binaya, MD   LORazepam (ATIVAN) injection 2 mg, 2 mg, Intravenous, Q6H PRN, Dahal, Binaya, MD   mirtazapine (REMERON) tablet 15 mg, 15 mg, Oral, QHS, Dahal, Binaya, MD   OLANZapine (ZYPREXA) tablet 10 mg, 10 mg, Oral, QHS, Dahal, Binaya, MD   ondansetron (ZOFRAN-ODT) disintegrating tablet 4 mg, 4 mg, Oral, Q8H PRN, Shela Leff, MD, 4 mg at 11/24/21 0841   oxyCODONE (Oxy IR/ROXICODONE) immediate release tablet 5 mg, 5 mg, Oral, Q4H PRN, Adefeso, Oladapo, DO, 5 mg at 11/24/21 0146   pantoprazole (PROTONIX) EC tablet 40 mg, 40 mg, Oral,  Daily, Shela Leff, MD, 40 mg at 11/24/21 0841   [START ON 11/25/2021] sertraline (ZOLOFT) tablet 50 mg, 50 mg, Oral, QAC breakfast, Dahal, Marlowe Aschoff, MD  Allergies: Allergies  Allergen Reactions   Ceftin [Cefuroxime Axetil] Hives, Swelling and Other (See Comments)    Swelling of face   Codeine Nausea Only   Compazine [Prochlorperazine] Swelling and Other (See Comments)    Face and tongue swelling       Objective  Vital signs:  Temp:  [97.7 F (36.5 C)-98.3 F (36.8 C)] 98.2 F (36.8 C) (11/13 0725) Pulse Rate:  [69-88] 88 (11/13 1145) Resp:  [13-23] 19 (11/13 1145) BP: (106-171)/(51-88) 150/80 (11/13 1145) SpO2:  [94 %-99 %] 97 % (11/13 1145) Weight:  [91.3 kg-91.4 kg] 91.3 kg (11/13 0500)  Psychiatric Specialty Exam:  Presentation  General Appearance:  Appropriate for Environment; Casual; Fairly Groomed  Eye Contact: Good  Speech: Clear and Coherent; Normal Rate  Speech Volume: Normal    Mood and Affect  Mood: Anxious  Affect: congruent  Thought Process  Thought Processes: Coherent  Descriptions of Associations:Intact  Orientation:Full (Time, Place and Person)  Thought Content:Logical; Rumination  History of Schizophrenia/Schizoaffective disorder:No  Duration of Psychotic Symptoms:N/A  Hallucinations: reports seeing her deceased animals, not distressing to her, denies AH Ideas of Reference:None  Suicidal Thoughts: "comes and goes", occasional thoughts with no plan or intent Homicidal Thoughts: denies  Sensorium  Memory: Poor  Judgment: Poor  Insight: poor  Executive Functions  Concentration: Poor  Attention Span: Poor  Recall: Poor  Fund of Knowledge: Poor  Language: poor  Psychomotor Activity  Psychomotor Activity: normal  Assets  Assets: Armed forces logistics/support/administrative officer; Desire for Improvement; Physical Health; Resilience   Sleep  Sleep: poor   Physical Exam Constitutional:      Appearance: the patient is not  toxic-appearing.  Pulmonary:     Effort: Pulmonary effort is normal.  Neurological:     General: No focal deficit present.     Mental Status: the patient is alert and oriented to person, place, and time.  Review of Systems  Respiratory:  Negative for shortness of breath.   Cardiovascular:  Negative for chest pain.  Gastrointestinal:  Negative for abdominal pain, constipation, diarrhea, nausea and vomiting.  Neurological:  Negative for headaches.   Blood pressure (!) 150/80, pulse 88, temperature 98.2 F (36.8 C), temperature source Oral, resp. rate 19, height '5\' 3"'$  (1.6 m), weight 91.3 kg, SpO2 97 %. Body mass index is 35.66 kg/m.  Corky Sox, MD PGY-2

## 2021-11-24 NOTE — Progress Notes (Signed)
RT went to see if pt wanted to wear cpap tonight, pt asleep at this time and RN asked RT not to wake pt. RT told RN to call if she wants to wear her cpap machine once pt wakes up.

## 2021-11-24 NOTE — Evaluation (Signed)
Physical Therapy Evaluation Patient Details Name: Danielle [name] MRN: [id number] DOB: 1945/02/12 Today's Date: 11/24/2021  History of Present Illness  Pt is a 76 y/o female presenting on 11/12 with SOB, LE swelling, worsening panic attacks. Noted recent admission 10/16-24 with frequent falls, lumbar compression fx with SNF recommended but dc'd home. Admitted for new onset CHF.  PMH includes: arthritis, CAD, dementia, depression and anxiety, glaucoma, HTN, L THA.  Clinical Impression  PTA, pt lives with her spouse, uses a RW for mobility and requires assist for lower body ADL's. Pt overall is mobilizing fair; ambulating 70 ft with a walker at a supervision level. HR 88-100 bpm, SpO2 97% on RA. Demonstrates increased BLE edema, mild balance deficits, weakness, and decreased activity tolerance. Would benefit from continued HHPT to address.      Recommendations for follow up therapy are one component of a multi-disciplinary discharge planning process, led by the attending physician.  Recommendations may be updated based on patient status, additional functional criteria and insurance authorization.  Follow Up Recommendations Home health PT      Assistance Recommended at Discharge PRN  Patient can return home with the following  A little help with walking and/or transfers;A little help with bathing/dressing/bathroom;Assistance with cooking/housework;Assist for transportation;Help with stairs or ramp for entrance;Direct supervision/assist for medications management;Direct supervision/assist for financial management    Equipment Recommendations None recommended by PT  Recommendations for Other Services       Functional Status Assessment Patient has had a recent decline in their functional status and demonstrates the ability to make significant improvements in function in a reasonable and predictable amount of time.     Precautions / Restrictions Precautions Precautions: Fall Precaution  Comments: incontinent bladder Restrictions Weight Bearing Restrictions: No      Mobility  Bed Mobility               General bed mobility comments: OOB in chair upon entry    Transfers Overall transfer level: Needs assistance Equipment used: Rolling walker (2 wheels) Transfers: Sit to/from Stand Sit to Stand: Supervision           General transfer comment: for safety    Ambulation/Gait Ambulation/Gait assistance: Supervision Gait Distance (Feet): 70 Feet Assistive device: Rolling walker (2 wheels) Gait Pattern/deviations: Step-through pattern, Decreased stride length Gait velocity: decreased     General Gait Details: Overall slow and steady pace, supervision for safety  Stairs            Wheelchair Mobility    Modified Rankin (Stroke Patients Only)       Balance Overall balance assessment: Mild deficits observed, not formally tested                                           Pertinent Vitals/Pain Pain Assessment Pain Assessment: No/denies pain    Home Living Family/patient expects to be discharged to:: Private residence Living Arrangements: Spouse/significant other Available Help at Discharge: Family Type of Home: House Home Access: Stairs to enter   Technical brewer of Steps: 2 Alternate Level Stairs-Number of Steps: flight- has chair lift Home Layout: Two level;1/2 bath on main level Home Equipment: Conservation officer, nature (2 wheels);Shower seat;Shower seat - built in;Cane - single point;Adaptive equipment      Prior Function Prior Level of Function : Needs assist             Mobility Comments:  using cane vs RW. denies falls since prior admission, but has had 2 falls within past month ADLs Comments: pt spouse helping with LB dressing i.e. shoes, socks, pulling up pants     Hand Dominance   Dominant Hand: Left    Extremity/Trunk Assessment   Upper Extremity Assessment Upper Extremity Assessment: Generalized  weakness    Lower Extremity Assessment Lower Extremity Assessment: RLE deficits/detail;LLE deficits/detail RLE Deficits / Details: Grossly 4/5, increased edema LLE Deficits / Details: Grossly 4/5, increased edema       Communication   Communication: Expressive difficulties  Cognition Arousal/Alertness: Awake/alert Behavior During Therapy: Flat affect, Anxious Overall Cognitive Status: History of cognitive impairments - at baseline                                 General Comments: pt oriented and follows simple commands with increased time, often loses train of thought and has difficulty with word finding        General Comments General comments (skin integrity, edema, etc.): spouse present and supportive    Exercises     Assessment/Plan    PT Assessment Patient needs continued PT services  PT Problem List Decreased strength;Decreased activity tolerance;Decreased balance;Decreased mobility;Decreased cognition;Decreased safety awareness       PT Treatment Interventions      PT Goals (Current goals can be found in the Care Plan section)  Acute Rehab PT Goals Patient Stated Goal: pt spouse would like her to be on correct medication PT Goal Formulation: With patient/family Time For Goal Achievement: 12/08/21 Potential to Achieve Goals: Good    Frequency Min 2X/week     Co-evaluation               AM-PAC PT "6 Clicks" Mobility  Outcome Measure Help needed turning from your back to your side while in a flat bed without using bedrails?: None Help needed moving from lying on your back to sitting on the side of a flat bed without using bedrails?: A Little Help needed moving to and from a bed to a chair (including a wheelchair)?: A Little Help needed standing up from a chair using your arms (e.g., wheelchair or bedside chair)?: A Little Help needed to walk in hospital room?: A Little Help needed climbing 3-5 steps with a railing? : A Little 6 Click  Score: 19    End of Session Equipment Utilized During Treatment: Gait belt Activity Tolerance: Patient tolerated treatment well Patient left: in chair;with call bell/phone within reach;with chair alarm set;with family/visitor present Nurse Communication: Mobility status PT Visit Diagnosis: Unsteadiness on feet (R26.81);History of falling (Z91.81);Muscle weakness (generalized) (M62.81)    Time: 9798-9211 PT Time Calculation (min) (ACUTE ONLY): 28 min   Charges:   PT Evaluation $PT Eval Low Complexity: 1 Low PT Treatments $Therapeutic Activity: 8-22 mins        Wyona Almas, PT, DPT Acute Rehabilitation Services Office [phone]   Deno Etienne 11/24/2021, 11:25 AM

## 2021-11-24 NOTE — Progress Notes (Signed)
*  PRELIMINARY RESULTS* Echocardiogram 2D Echocardiogram has been performed.  Danielle [name] 11/24/2021, 12:47 PM

## 2021-11-24 NOTE — Progress Notes (Signed)
CPAP set up at bedside. RT to place patient on, MRI at bedside to take patient for scan. RN to call RT when patient back from MRI and ready for CPAP.

## 2021-11-24 NOTE — Progress Notes (Signed)
Heart Failure Navigator Progress Note  Following this hospitalization to assess for HV TOC readiness.   ECHO pending? Last EF 55-60% (2018)  Earnestine Leys, BSN, RN Heart Failure Leisure centre manager Chat Only

## 2021-11-24 NOTE — Progress Notes (Signed)
PROGRESS NOTE  Danielle [name]  DOB: 07/29/1945  PCP: [name], MD ENI:[id number]  DOA: 11/23/2021  LOS: 1 day  Hospital Day: 2  Brief narrative: Danielle [name] is a 76 y.o. female with PMH significant for DM2, HTN, HLD, CAD, cognitive decline/memory loss, depression/anxiety, hypothyroidism, OSA on CPAP who was recently hospitalized 10/16-10/24 for frequent falls, lumbar compression fracture, AKI, UTI.  Hospital course was also complicated by delirium/hallucinations. PT OT had recommended SNF but family declined and she was discharged with home health.   11/12, patient presented to the ED with complaint of shortness of breath, leg swelling, worsening anxiety/panic attacks/depression, delirium/hallucinations, insomnia, poor appetite.  Family is having difficulty taking care of her at home. In the ED, vital signs stable.  Labs showing BNP 166.   UA with negative nitrite, small amount of leukocytes, and microscopy showing 6-10 WBCs and rare bacteria.   Chest x-ray showed cardiomegaly with mild diffuse bilateral interstitial opacity consistent with edema or atypical/viral infection; no focal airspace opacity.  Patient was given fentanyl and IV Lasix 40 mg.   Admitted to [facility] On chart review it was noted that patient was having memory issues for last several months.  She was seen by neurology as an outpatient and was planned for an MRI brain which has not been done yet.  Patient lately has been having a lot of panic attacks. For the past few days she noticed that both of her legs are swollen.  She feels winded just walking to her mailbox at home.    In the ED, patient was afebrile, heart rate in 70s and 80s, blood pressure in 140s 150s, breathing on room air. Labs from this morning with negative procalcitonin.  Stable renal function.  Normal TSH. Echo with EF 60 to 65%.  Subjective: Patient was seen and examined this morning and this afternoon.  Lying on bed.  Not in pain.   Tearful at times.  Seems anxious.  Expecting her husband and son to come soon.  I called and discussed with her husband on the phone. Seen by psychiatry team this afternoon.  Assessment and plan: Dyspnea on exertion History of hypertension Presented with dyspnea on exertion, bilateral pedal edema. Chest x-ray showed cardiomegaly with mild diffuse bilateral interstitial opacity suggestive of pulm edema.   Echocardiogram this morning with EF 60 to 65%, no wall abnormality Because of bilateral pedal edema, she is currently on IV Lasix 20 mg twice daily. Continue to monitor BMP PTA on metoprolol 12.5 mg a.m./25 mg p.m.  Resume at 12.5 mg twice daily.  Acute delirium  progressively worsening cognitive decline Worsening anxiety/panic attacks History of depression Having intermittent episodes of restlessness, agitation in the hospital. TeleSitter ordered.  Okay to use soft restraints, waist restraints. As needed IV Ativan. Last neurology office visit 10/5 and formal neurocognitive testing was not consistent with neurodegenerative disease.  Plan was to obtain a brain MRI for further evaluation which has not been done yet.   Psychiatry consult done today.  Will follow recommendations.   On multiple psych meds at home including Remeron, Zyprexa, Zoloft.  Defer to psychiatry for med adjustment MRI brain was attempted on admission but patient did not complete his evaluation.  No intracranial process identified and suboptimal study.   Dysuria/ ?UTI UA with negative nitrite, small amount of leukocytes, and microscopy showing 6-10 WBCs and rare bacteria.  No fever, leukocytosis, or signs of sepsis. -Urine culture  Type 2 diabetes mellitus A1c 5.7 on 10/28/2021 PTA not  on meds. Currently on sliding scale insulin with Accu-Cheks Recent Labs  Lab 11/23/21 2235 11/24/21 0642 11/24/21 1145  GLUCAP 114* 117* 129*   CAD/HLD No anginal symptoms. EKG without ischemic changes Continue Crestor 40 mg  daily.  Not on blood thinners  OSA Continue nightly CPAP  Hypothyroidism TSH normal.  Continue Synthroid  Goals of care   Code Status: Full Code    Mobility: PT eval  Skin assessment:     Nutritional status:  Body mass index is 35.66 kg/m.          Diet:  Diet Order             Diet heart healthy/carb modified Room service appropriate? Yes; Fluid consistency: Thin; Fluid restriction: 1200 mL Fluid  Diet effective now                   DVT prophylaxis:  SCDs Start: 11/23/21 2127   Antimicrobials: None Fluid: None Consultants: Psychiatry Family Communication: Discussed with patient's husband on the phone  Status is: Observation  Continue in-hospital care because: Continues to remain altered Level of care: Telemetry Cardiac   Dispo: The patient is from: Home              Anticipated d/c is to: Pending clinical course              Patient currently is not medically stable to d/c.   Difficult to place patient No     Infusions:    Scheduled Meds:  furosemide  20 mg Intravenous BID   insulin aspart  0-5 Units Subcutaneous QHS   insulin aspart  0-9 Units Subcutaneous TID WC   [START ON 11/25/2021] levothyroxine  137 mcg Oral QAC breakfast   metoprolol tartrate  12.5 mg Oral BID   mirtazapine  15 mg Oral QHS   OLANZapine  10 mg Oral QHS   pantoprazole  40 mg Oral Daily   [START ON 11/25/2021] sertraline  50 mg Oral QAC breakfast    PRN meds: acetaminophen **OR** acetaminophen, LORazepam, ondansetron, oxyCODONE   Antimicrobials: Anti-infectives (From admission, onward)    None       Objective: Vitals:   11/24/21 0725 11/24/21 1145  BP: (!) 154/70 (!) 150/80  Pulse: 72 88  Resp: 20 19  Temp: 98.2 F (36.8 C)   SpO2: 98% 97%    Intake/Output Summary (Last 24 hours) at 11/24/2021 1542 Last data filed at 11/24/2021 1305 Gross per 24 hour  Intake --  Output 3375 ml  Net -3375 ml   Filed Weights   11/23/21 1506 11/23/21 2050  11/24/21 0500  Weight: 90.7 kg 91.4 kg 91.3 kg   Weight change:  Body mass index is 35.66 kg/m.   Physical Exam: General exam: Pleasant elderly Caucasian female.  Not in pain Skin: No rashes, lesions or ulcers. HEENT: Atraumatic, normocephalic, no obvious bleeding Lungs: Clear to auscultation bilaterally CVS: Regular rate and rhythm, no murmur GI/Abd soft, nontender, nondistended, bowel sound present CNS: Alert, awake, oriented to place only. Psychiatry: Anxious, tearful at times Extremities: 1+ bilateral pedal edema, no calf tenderness  Data Review: I have personally reviewed the laboratory data and studies available.  F/u labs ordered Unresulted Labs (From admission, onward)     Start     Ordered   11/23/21 2131  Urine Culture  (Urine Culture)  Add-on,   AD       Question:  Indication  Answer:  Altered mental status (if no other cause identified)  11/23/21 2132            Signed, Terrilee Croak, MD [facility] 11/24/2021

## 2021-11-25 DIAGNOSIS — R0609 Other forms of dyspnea: Secondary | ICD-10-CM | POA: Diagnosis not present

## 2021-11-25 DIAGNOSIS — R4189 Other symptoms and signs involving cognitive functions and awareness: Secondary | ICD-10-CM

## 2021-11-25 DIAGNOSIS — I509 Heart failure, unspecified: Secondary | ICD-10-CM | POA: Diagnosis not present

## 2021-11-25 LAB — GLUCOSE, CAPILLARY
Glucose-Capillary: 122 mg/dL — ABNORMAL HIGH (ref 70–99)
Glucose-Capillary: 151 mg/dL — ABNORMAL HIGH (ref 70–99)
Glucose-Capillary: 85 mg/dL (ref 70–99)

## 2021-11-25 MED ORDER — FUROSEMIDE 20 MG PO TABS
20.0000 mg | ORAL_TABLET | Freq: Every day | ORAL | Status: DC
  Administered 2021-11-26: 20 mg via ORAL
  Filled 2021-11-25: qty 1

## 2021-11-25 MED ORDER — FUROSEMIDE 20 MG PO TABS: 20.0000 mg | ORAL_TABLET | Freq: Every day | ORAL | Status: DC

## 2021-11-25 NOTE — Progress Notes (Addendum)
Pt noted this am approximately 7:30 saying she has to go and attempting to get out of bed unassisted.  She was oriented to self and disoriented to place, situation and time.  She required many interventions prior to breakfast but then appeared to become more oriented and cooperative.    She is able and willing to talk and describes how she has been seeing humans and animals, especially her old little dog when she is in the kitchen.   She expresses she can't get her words out the right way, that it comes out different then what she meant or even looses her train of thought.   She feels like her and her husband have been arguing a lot, maybe because of the communication problems.  She describes them as coming and going or getting better or worse over the day and weeks.  She thought she had dementia  or alzheimer's so she went see the neurologist and able to recall the visit and saying she had MDD

## 2021-11-25 NOTE — Progress Notes (Signed)
PROGRESS NOTE  Danielle [name]  DOB: 1945-06-21  PCP: [name], MD HQI:[id number]  DOA: 11/23/2021  LOS: 1 day  Hospital Day: 3  Brief narrative: Danielle [name] is a 76 y.o. female with PMH significant for DM2, HTN, HLD, CAD, cognitive decline/memory loss, depression/anxiety, hypothyroidism, OSA on CPAP who was recently hospitalized 10/16-10/24 for frequent falls, lumbar compression fracture, AKI, UTI.  Hospital course was also complicated by delirium/hallucinations. PT OT had recommended SNF but family declined and she was discharged with home health.   11/12, patient presented to the ED with complaint of shortness of breath, leg swelling, worsening anxiety/panic attacks/depression, delirium/hallucinations, insomnia, poor appetite.  Family is having difficulty taking care of her at home. In the ED, vital signs stable.  Labs showing BNP 166.   UA with negative nitrite, small amount of leukocytes, and microscopy showing 6-10 WBCs and rare bacteria.   Chest x-ray showed cardiomegaly with mild diffuse bilateral interstitial opacity consistent with edema or atypical/viral infection; no focal airspace opacity.  Patient was given fentanyl and IV Lasix 40 mg.   Admitted to [facility] On chart review it was noted that patient was having memory issues for last several months.  She was seen by neurology as an outpatient and was planned for an MRI brain which has not been done yet.  Patient lately has been having a lot of panic attacks. For the past few days she noticed that both of her legs are swollen.  She feels winded just walking to her mailbox at home.    In the ED, patient was afebrile, heart rate in 70s and 80s, blood pressure in 140s 150s, breathing on room air. Labs from this morning with negative procalcitonin.  Stable renal function.  Normal TSH. Echo with EF 60 to 65%.  Subjective: Patient was seen and examined this morning.  Propped up in bed.  Not in distress.  No new  symptoms.  No agitation at the time of my evaluation.  Husband not at bedside. Nursing reported multiple episodes of agitation last 24 hours. Psych consult appreciated.  Assessment and plan: Dyspnea on exertion History of hypertension Presented with dyspnea on exertion, bilateral pedal edema. Chest x-ray showed cardiomegaly with mild diffuse bilateral interstitial opacity suggestive of pulm edema.   Echocardiogram this morning with EF 60 to 65%, no wall abnormality Because of bilateral pedal edema, she is currently on IV Lasix 20 mg twice daily. Switch to oral Lasix today.  Continue metoprolol started at 12.5 mg twice daily.  Acute delirium  progressively worsening cognitive decline Worsening anxiety/panic attacks History of depression Having intermittent episodes of restlessness, agitation in the hospital. TeleSitter ordered.  Okay to use soft restraints, waist restraints. As needed IV Ativan. Last neurology office visit 10/5 and formal neurocognitive testing was not consistent with neurodegenerative disease.  Plan was to obtain a brain MRI for further evaluation which has not been done yet.   On multiple psych meds at home including Remeron, Zyprexa, Zoloft.  Defer to psychiatry for med adjustment MRI brain was attempted on admission but patient did not complete his evaluation.  No intracranial process identified and suboptimal study. Psychiatry following.  May need inpatient psych.   Dysuria/ ?UTI UA with negative nitrite, small amount of leukocytes, and microscopy showing 6-10 WBCs and rare bacteria.  No fever, leukocytosis, or signs of sepsis.  Type 2 diabetes mellitus A1c 5.7 on 10/28/2021 PTA not on meds. Currently on sliding scale insulin with Accu-Cheks Recent Labs  Lab 11/24/21 1145  11/24/21 1717 11/24/21 2117 11/25/21 0615 11/25/21 1113  GLUCAP 129* 107* 121* 122* 151*   CAD/HLD No anginal symptoms. EKG without ischemic changes Continue Crestor 40 mg daily.  Not  on blood thinners  OSA Continue nightly CPAP  Hypothyroidism TSH normal.  Continue Synthroid  Goals of care   Code Status: Full Code    Mobility: PT eval  Skin assessment:     Nutritional status:  Body mass index is 34.03 kg/m.          Diet:  Diet Order             Diet heart healthy/carb modified Room service appropriate? Yes; Fluid consistency: Thin; Fluid restriction: 1200 mL Fluid  Diet effective now                   DVT prophylaxis:  SCDs Start: 11/23/21 2127   Antimicrobials: None Fluid: None Consultants: Psychiatry Family Communication: Discussed with patient's husband on the phone  Status is: Observation  Continue in-hospital care because: Continues to remain altered Level of care: Telemetry Cardiac   Dispo: The patient is from: Home              Anticipated d/c is to: Pending clinical course              Patient currently is not medically stable to d/c. Difficult to place patient No     Infusions:    Scheduled Meds:  [START ON 11/26/2021] furosemide  20 mg Oral Daily   insulin aspart  0-5 Units Subcutaneous QHS   insulin aspart  0-9 Units Subcutaneous TID WC   levothyroxine  137 mcg Oral QAC breakfast   metoprolol tartrate  12.5 mg Oral BID   mirtazapine  15 mg Oral QHS   OLANZapine  5 mg Oral QHS   pantoprazole  40 mg Oral Daily   sertraline  50 mg Oral QAC breakfast    PRN meds: acetaminophen **OR** acetaminophen, LORazepam, ondansetron, oxyCODONE   Antimicrobials: Anti-infectives (From admission, onward)    None       Objective: Vitals:   11/25/21 0735 11/25/21 1119  BP: (!) 153/77 115/82  Pulse: 87 86  Resp: 16 20  Temp: 97.7 F (36.5 C) 97.7 F (36.5 C)  SpO2: 98% 94%    Intake/Output Summary (Last 24 hours) at 11/25/2021 1443 Last data filed at 11/25/2021 0528 Gross per 24 hour  Intake 50 ml  Output 1950 ml  Net -1900 ml   Filed Weights   11/23/21 2050 11/24/21 0500 11/25/21 0528  Weight: 91.4  kg 91.3 kg 87.1 kg   Weight change: -3.583 kg Body mass index is 34.03 kg/m.   Physical Exam: General exam: Pleasant elderly Caucasian female.  Not in pain Skin: No rashes, lesions or ulcers. HEENT: Atraumatic, normocephalic, no obvious bleeding Lungs: Clear to auscultation bilaterally CVS: Regular rate and rhythm, no murmur GI/Abd soft, nontender, nondistended, bowel sound present CNS: Alert, awake, oriented to place only. Psychiatry: Anxious, tearful at times Extremities: Improved bilateral pedal edema, no calf tenderness  Data Review: I have personally reviewed the laboratory data and studies available.  F/u labs ordered Unresulted Labs (From admission, onward)     Start     Ordered   11/23/21 2131  Urine Culture  (Urine Culture)  Add-on,   AD       Question:  Indication  Answer:  Altered mental status (if no other cause identified)   11/23/21 2132  Signed, Terrilee Croak, MD [facility] 11/25/2021

## 2021-11-25 NOTE — Progress Notes (Signed)
Heart Failure Navigator Progress Note  Assessed for Heart & Vascular TOC clinic readiness.  Patient does not meet criteria due to cognitive decline..   Navigator available for reassessment of patient.   Earnestine Leys, BSN, Clinical cytogeneticist Only

## 2021-11-25 NOTE — Care Management Obs Status (Signed)
[facility] NOTIFICATION   Patient Details  Name: Brenda Samano MRN: [id number] Date of Birth: 1945-08-20   Medicare Observation Status Notification Given:  Yes    Dawayne Patricia, RN 11/25/2021, 3:59 PM

## 2021-11-25 NOTE — Progress Notes (Signed)
Occupational Therapy Treatment Patient Details Name: Danielle [name] MRN: [id number] DOB: [birth year] Today's Date: 11/25/2021   History of present illness Pt is a 76 y/o female presenting on 11/12 with SOB, LE swelling, worsening panic attacks. Noted recent admission 10/16-24 with frequent falls, lumbar compression fx with SNF recommended but dc'd home. Admitted for new onset CHF.  PMH includes: arthritis, CAD, dementia, depression and anxiety, glaucoma, HTN, L THA.   OT comments  Pt standing with NT upon entry, pt with increased confusion today.  Completing LB dressing with min assist, functional mobility with min guard to supervision for RW safety.  She follows simple commands with increased time but requires increased re-direction to task, poor awareness of safety.  Continue to recommend 24/7 assist at home due to cognition.  Will follow acutely.    Recommendations for follow up therapy are one component of a multi-disciplinary discharge planning process, led by the attending physician.  Recommendations may be updated based on patient status, additional functional criteria and insurance authorization.    Follow Up Recommendations  Home health OT     Assistance Recommended at Discharge Frequent or constant Supervision/Assistance  Patient can return home with the following  A little help with walking and/or transfers;A lot of help with bathing/dressing/bathroom;Assistance with cooking/housework;Direct supervision/assist for medications management;Direct supervision/assist for financial management;Assist for transportation;Help with stairs or ramp for entrance   Equipment Recommendations  BSC/3in1 (spouse checking if they already have one? may not need.)    Recommendations for Other Services      Precautions / Restrictions Precautions Precautions: Fall Precaution Comments: incontinent bladder Restrictions Weight Bearing Restrictions: No       Mobility Bed Mobility                General bed mobility comments: OOB with NT upon entry    Transfers Overall transfer level: Needs assistance Equipment used: Rolling walker (2 wheels) Transfers: Sit to/from Stand Sit to Stand: Supervision           General transfer comment: for safety     Balance Overall balance assessment: Mild deficits observed, not formally tested                                         ADL either performed or assessed with clinical judgement   ADL Overall ADL's : Needs assistance/impaired                     Lower Body Dressing: Minimal assistance;Sit to/from stand Lower Body Dressing Details (indicate cue type and reason): assist to thread R LE through mesh underwear, min guard when pulling over hips Toilet Transfer: Supervision/safety;Ambulation;Rolling walker (2 wheels)           Functional mobility during ADLs: Min guard;Rolling walker (2 wheels);Cueing for safety General ADL Comments: cueing for RW safety, pacing    Extremity/Trunk Assessment              Vision   Vision Assessment?: No apparent visual deficits   Perception     Praxis      Cognition Arousal/Alertness: Awake/alert Behavior During Therapy: Flat affect, Anxious Overall Cognitive Status: History of cognitive impairments - at baseline                                 General Comments: pt with  slow processing, following simple commands with increased time.  requires cueing for safety. orientation not tested today.        Exercises      Shoulder Instructions       General Comments chair belt alarm in chair per nursing request    Pertinent Vitals/ Pain       Pain Assessment Pain Assessment: No/denies pain  Home Living                                          Prior Functioning/Environment              Frequency  Min 2X/week        Progress Toward Goals  OT Goals(current goals can now be found in the care plan  section)  Progress towards OT goals: Progressing toward goals  Acute Rehab OT Goals Patient Stated Goal: home OT Goal Formulation: With patient Time For Goal Achievement: 12/08/21 Potential to Achieve Goals: Good  Plan Discharge plan remains appropriate;Frequency remains appropriate    Co-evaluation                 AM-PAC OT "6 Clicks" Daily Activity     Outcome Measure   Help from another person eating meals?: None Help from another person taking care of personal grooming?: A Little Help from another person toileting, which includes using toliet, bedpan, or urinal?: A Lot Help from another person bathing (including washing, rinsing, drying)?: A Lot Help from another person to put on and taking off regular upper body clothing?: A Little Help from another person to put on and taking off regular lower body clothing?: A Little 6 Click Score: 17    End of Session Equipment Utilized During Treatment: Gait belt;Rolling walker (2 wheels)  OT Visit Diagnosis: Other abnormalities of gait and mobility (R26.89);Muscle weakness (generalized) (M62.81);Other symptoms and signs involving cognitive function;History of falling (Z91.81)   Activity Tolerance Patient tolerated treatment well   Patient Left in chair;with call bell/phone within reach;with chair alarm set   Nurse Communication Mobility status        Time: 5366-4403 OT Time Calculation (min): 21 min  Charges: OT General Charges $OT Visit: 1 Visit OT Treatments $Self Care/Home Management : 8-22 mins  Jolaine Artist, OT Acute Rehabilitation Services Office [phone]   [name] 11/25/2021, 9:47 AM

## 2021-11-25 NOTE — Progress Notes (Signed)
Patient will be transported to [facility] by safe transport

## 2021-11-25 NOTE — Consult Note (Signed)
[facility] New Face-to-Face Psychiatric Evaluation   Service Date: November 25, 2021 LOS:  LOS: 1 day    Assessment  Danielle [name] is a 76 year old female with a history of memory and cognitive issues not due to neurodegenerative process, major depressive disorder with psychotic features, and medical comorbidities such as obstructive sleep apnea and type 2 diabetes.  She was medically admitted on 11/12 for concern for new onset heart failure.  Psychiatry was consulted for "panic attacks, depression, insomnia, and delirium".  On assessment the patient demonstrates difficulty engaging with moderately challenging cognitive tasks (saying the days of the week backwards).  She appears dysphoric and upset at her inability to perform these tasks.  Problems with memory appear to be the patient's primary concern.  The primary concern of her husband is that she is not performing simple activities of daily living, such as dressing herself, and he is forced to perform these activities for her.  Previous neuropsychological evaluation notes are very helpful as are notes from her outpatient neurologist.  They concur on the fact that the patient is not experiencing dementia or any sort of organic neurodegenerative process.  They suggest that treating the patient's anxiety is the best path forward.  Appropriately, she has been referred to psychiatry and has been following with psychiatric nurse practitioners, most recently [employer].  The regimen that she is currently on per the report of her husband and is noted in the NP's note is unlikely to be contributing to her cognitive difficulties.  Feel that the patient's cognitive issues, and her psychiatric symptoms more broadly, may be multifactorial in nature, related to major depressive disorder, prolonged grief from her dog dying in January, and some interpersonal behavioral component with her husband that is fostering dependency. Low concern for  delirium given no apparent fluctuation in mental status.  Adjustments to medication regimen as below.  Currently discussing utility of geriatric psychiatry inpatient admission with Dr. Louis Meckel.  No acute safety concerns, no need for one-to-one sitter.  11/14: patient accepted to geriatric psychiatry at [facility]. Family and her are in agreement. Same mental status exam.   Diagnoses:  Active Hospital problems: Principal Problem:   New onset of congestive heart failure (HCC) Active Problems:   OSA (obstructive sleep apnea)   Hypertension   Hyperlipidemia LDL goal <70   Hypothyroidism   Depression with anxiety   Cognitive decline   Delirium   Type 2 diabetes mellitus ([place])     Plan  ## Safety and Observation Level:  - Based on my clinical evaluation, I estimate the patient to be at low risk of self harm in the current setting - At this time, we recommend a routine level of observation. This decision is based on my review of the chart including patient's history and current presentation, interview of the patient, mental status examination, and consideration of suicide risk including evaluating suicidal ideation, plan, intent, suicidal or self-harm behaviors, risk factors, and protective factors. This judgment is based on our ability to directly address suicide risk, implement suicide prevention strategies and develop a safety plan while the patient is in the clinical setting. Please contact our team if there is a concern that risk level has changed.   ## Medications:  -- Continue home medication as ordered with the exception of: -- Decreased Zyprexa from 10 mg to 5 mg given that the medication is anticholinergic and 10 mg is a large dose for a person her age.   ## Medical Decision Making  Capacity:  Not formally assessed  ## Further Work-up:  -- per primary -- most recent EKG on 11/12 had QtC of 472 -- Pertinent labwork reviewed earlier this admission includes: unremarkable, BNP 166,  TSH nml  ## Disposition:  -- TBD  ## Behavioral / Environmental:  -- no need for 1:1  ##Legal Status Voluntary  Thank you for this consult request. Recommendations have been communicated to the primary team.  We will follow at this time.   Corky Sox, MD   NEW history  Relevant Aspects of Hospital Course:  Admitted on 11/23/2021 for HF workup. 11/13 echo normal.   Patient Report:  11/14: patient seen with husband and son in room with her consent. She agrees to inpatient hospitalization. All questions answered.   See initial narrative below: Pt seen and oriented to self, situation, month and year. She does not know who the president is. She is unable to add a nickel, a dime, and a quarter (states 35 cents). Able to do DOWB from Friday --> Tuesday with some difficulty and needs to be prompted.  . She lives in [place] area. She lives with her husband of 7 years. They are both retired.   She brings up her memory troubles during the mental status exam. She has seen a psychiatrist but cannot remember the name - eventually comes up with Danielle [name]. She is also seeing Danielle [name]. She has trouble answering many basic questions.   Some trouble with autobiographical recall.  Some expressive aphasia - states it is "Two Oh Two Three" instead of 2023.   She brings up sometimes feeling like her deceased pets are around her and seeing their silhouettes. She finds this to be   She did not know that psychiatry was consulted and had no idea why we were consulted. Had to ask who psychiatry team was multiple times.   She expresses distress that she has difficulty answering questions. She feels like answers to a lot of questions are just barely out of view.   States she has been dealing with depression for most of her life. Thinks she has tried 4-5 different medications for depression. She thinks her sleep is a major issue.   She does not recognize the word "mirtazapine". Her  husband has been managing her medications.   Appetite has been poor. Enjoys things but not as much as she would like to.  Hx passive but not active SI, no hx attempts.   Doesn't understand a lot of what is going on with her medical care.   Mentions being afraid of being locked up. Some paranoia evidnet near end of exam "trying to find out who are the good ones and who are not the good ones".    Med fill history:  Lots of meds from multiple providers. Unclear why she is seeing 2 outpt providers (dates overlap).   Filled 90 day supply of pristiq 7/26 [name] Poulous) - close to active 30 day supply of fluoxetine 10/24 (Lisa Poulous) - active 30 pills of hydroxyzine 9/19 (Lisa Poulous) - not active Lamictal 200 mg filled 11/1 for 30 days (Lisa Poulous) - active Lorazepam 1 mg BID filled 9/6 [name] Poulous) - not active Mirtazapine 15 mg 30 day supply 10/26 Danielle [name]) - active Olanzapine 10 mg 30 day supply 10/26 Danielle [name]) - active Sertraline 50 mg 30 day supply 10/27 Danielle [name])  - active    From Danielle [name] (NP) telephone visit 10/23:  I spent over 90 minutes with  them last visit reviewing medications with them. Two one gallon bag fulls of medication. They were confused and I did the best I could with what I had. I did not increase her Lamictal.  Their med list was a hot mess. I told them she needs to have her non psych meds reviewed by her pcp. Lamictal is not causing the fall risk regardless. She needs to follow up with [facility] for her other meds.     Collateral information:  Patient's husband reports that they were married 5 years ago.  At the beginning of his marriage he states the patient was fully independent and a fun person.  He reports a few years ago the patient began experiencing mood problems and minor forgetfulness.  He reports that starting in January of this year they had to euthanize their dog.  He states the patient was very close to the dog.  Since that time the patient  has been confused and scared.  He asked the patient frequently what she is scared of and she is unable to answer him.  More recently she has not been performing her activities of daily living and he has been dressing her.  He says that he is at his wits end and brought her to the hospital because she has been asking to go for a while.  He denies any acute safety concerns.  He states that she has occasionally made statements about not wanting to live but that she has never harmed herself intentionally or attempted suicide.   Psychiatric History:  Information collected from chart review and husband Major depressive disorder with psychotic features, anxiety  Family psych history: None   Social History:  Tobacco use: Denies Alcohol use: Less than 1 time per week Drug use: Denies  Family History:  The patient's family history includes Breast cancer in her maternal aunt; Cancer in her mother.  Medical History: Past Medical History:  Diagnosis Date   Arthritis 02/13/2011   osteoarthritis, hips, knees,s/p Cervical fusion(DDD)   Atherosclerosis of aorta ([place]) 05/30/2015   on CTA chest   CAD (coronary artery disease)    a. 09/2016: Coronary CT showing mid-LAD plaque with 20-50% associated stenosis but no significant stenosis by FFR analysis.    Dementia ([place])    Depression with anxiety    Diabetes mellitus without complication ([place])    Difficult intubation 02/13/2011   with surgery -multiple times, no problems with recent surgeries   GERD (gastroesophageal reflux disease) 02/13/2011   tx. Omeprazole   Glaucoma 02/13/2011   bil. tx. eye drops daily   Headache(784.0) 02/13/2011   past hx. migraines, none recent   Heart murmur 02/13/2011   was told-no issues with this   Hypercholesterolemia    Hypertension 02/13/2011   Hypothyroidism 02/13/2011   tx. with Levothyroxine   Major depressive disorder    Mitral regurgitation 06/01/2015   Mild   Sinus drainage 02/13/2011   uses Zyrtec as  needed   Sleep apnea 02/13/2011   Hx. sleep apnea-uses cpap since 10'12 nightly    Surgical History: Past Surgical History:  Procedure Laterality Date   ABDOMINAL HYSTERECTOMY  02/13/2011   APPENDECTOMY  02/13/2011   Lap. removal '92   BREAST CYST EXCISION Left    CERVICAL FUSION  02/13/2011   '92- retained hardware   CHOLECYSTECTOMY  02/13/2011   '98-lap. galbladder removal due to stones   GUM SURGERY  02/13/2011   "small mouth"   JOINT REPLACEMENT  02/13/2011   RTHA-a yr ago  in [place] Left 08/15/2019   Procedure: EXCISION OF SEBACEOUS CYST LEFT BREAST;  Surgeon: Georganna Skeans, MD;  Location: [facility];  Service: General;  Laterality: Left;   TONSILLECTOMY  02/13/2011   child   TOTAL HIP ARTHROPLASTY  02/17/2011   Procedure: TOTAL HIP ARTHROPLASTY ANTERIOR APPROACH;  Surgeon: Mauri Pole, MD;  Location: [facility];  Service: Orthopedics;  Laterality: Left;   TUBAL LIGATION  02/13/2011    Medications:   Current Facility-Administered Medications:    acetaminophen (TYLENOL) tablet 650 mg, 650 mg, Oral, Q6H PRN, 650 mg at 11/25/21 0932 **OR** acetaminophen (TYLENOL) suppository 650 mg, 650 mg, Rectal, Q6H PRN, Shela Leff, MD   [START ON 11/26/2021] furosemide (LASIX) tablet 20 mg, 20 mg, Oral, Daily, Dahal, Binaya, MD   insulin aspart (novoLOG) injection 0-5 Units, 0-5 Units, Subcutaneous, QHS, Rathore, Vasundhra, MD   insulin aspart (novoLOG) injection 0-9 Units, 0-9 Units, Subcutaneous, TID WC, Shela Leff, MD, 1 Units at 11/25/21 6269   levothyroxine (SYNTHROID) tablet 137 mcg, 137 mcg, Oral, QAC breakfast, Dahal, Binaya, MD, 137 mcg at 11/25/21 0543   LORazepam (ATIVAN) injection 2 mg, 2 mg, Intravenous, Q6H PRN, Dahal, Binaya, MD, 2 mg at 11/25/21 1200   metoprolol tartrate (LOPRESSOR) tablet 12.5 mg, 12.5 mg, Oral, BID, Dahal, Binaya, MD, 12.5 mg at 11/25/21 0932   mirtazapine (REMERON) tablet 15 mg, 15 mg, Oral, QHS, Dahal,  Binaya, MD, 15 mg at 11/24/21 2136   OLANZapine (ZYPREXA) tablet 5 mg, 5 mg, Oral, QHS, Corky Sox, MD, 5 mg at 11/24/21 2135   ondansetron (ZOFRAN-ODT) disintegrating tablet 4 mg, 4 mg, Oral, Q8H PRN, Shela Leff, MD, 4 mg at 11/24/21 2040   oxyCODONE (Oxy IR/ROXICODONE) immediate release tablet 5 mg, 5 mg, Oral, Q4H PRN, Adefeso, Oladapo, DO, 5 mg at 11/24/21 0146   pantoprazole (PROTONIX) EC tablet 40 mg, 40 mg, Oral, Daily, Shela Leff, MD, 40 mg at 11/25/21 0931   sertraline (ZOLOFT) tablet 50 mg, 50 mg, Oral, QAC breakfast, Dahal, Binaya, MD, 50 mg at 11/25/21 0932  Allergies: Allergies  Allergen Reactions   Ceftin [Cefuroxime Axetil] Hives, Swelling and Other (See Comments)    Swelling of face   Codeine Nausea Only   Compazine [Prochlorperazine] Swelling and Other (See Comments)    Face and tongue swelling       Objective  Vital signs:  Temp:  [97.7 F (36.5 C)-97.9 F (36.6 C)] 97.7 F (36.5 C) (11/14 1119) Pulse Rate:  [86-98] 86 (11/14 1119) Resp:  [16-20] 20 (11/14 1119) BP: (115-153)/(77-95) 115/82 (11/14 1119) SpO2:  [94 %-98 %] 94 % (11/14 1119) Weight:  [87.1 kg] 87.1 kg (11/14 0528)  Psychiatric Specialty Exam:  Presentation  General Appearance:  Appropriate for Environment; Casual; Fairly Groomed  Eye Contact: Good  Speech: Clear and Coherent; Normal Rate  Speech Volume: Normal    Mood and Affect  Mood: Anxious  Affect: congruent  Thought Process  Thought Processes: Coherent  Descriptions of Associations:Intact  Orientation:Full (Time, Place and Person)  Thought Content:Logical; Rumination  History of Schizophrenia/Schizoaffective disorder:No  Duration of Psychotic Symptoms:N/A  Hallucinations: reports seeing her deceased animals, not distressing to her, denies AH Ideas of Reference:None  Suicidal Thoughts: "comes and goes", occasional thoughts with no plan or intent Homicidal Thoughts: denies  Sensorium   Memory: Poor  Judgment: Poor  Insight: poor  Executive Functions  Concentration: Poor  Attention Span: Poor  Recall: Poor  Fund of Knowledge: Poor  Language: poor  Psychomotor Activity  Psychomotor Activity: normal  Assets  Assets: Communication Skills; Desire for Improvement; Physical Health; Resilience   Sleep  Sleep: poor   Physical Exam Constitutional:      Appearance: the patient is not toxic-appearing.  Pulmonary:     Effort: Pulmonary effort is normal.  Neurological:     General: No focal deficit present.     Mental Status: the patient is alert and oriented to person, place, and time.   Review of Systems  Respiratory:  Negative for shortness of breath.   Cardiovascular:  Negative for chest pain.  Gastrointestinal:  Negative for abdominal pain, constipation, diarrhea, nausea and vomiting.  Neurological:  Negative for headaches.   Blood pressure 115/82, pulse 86, temperature 97.7 F (36.5 C), temperature source Oral, resp. rate 20, height '5\' 3"'$  (1.6 m), weight 87.1 kg, SpO2 94 %. Body mass index is 34.03 kg/m.  Corky Sox, MD PGY-2

## 2021-11-25 NOTE — Care Management CC44 (Signed)
Condition Code 44 Documentation Completed  Patient Details  Name: Danielle [name] MRN: [id number] Date of Birth: 29-Sep-1945   Condition Code 44 given:  Yes Patient signature on Condition Code 44 notice:  Yes Documentation of 2 MD's agreement:  Yes Code 44 added to claim:  Yes    Dawayne Patricia, RN 11/25/2021, 3:59 PM

## 2021-11-25 NOTE — Discharge Summary (Signed)
Physician Discharge Summary  Arria Naim TSV:[id number] DOB: [birth year] DOA: 11/23/2021  PCP: [name], MD  Admit date: 11/23/2021 Discharge date: 11/25/2021  Admitted From: Home Discharge disposition: Inpatient psych  Recommendations at discharge:  Per psychiatry    Brief narrative: Danielle [name] is a 76 y.o. female with PMH significant for DM2, HTN, HLD, CAD, cognitive decline/memory loss, depression/anxiety, hypothyroidism, OSA on CPAP who was recently hospitalized 10/16-10/24 for frequent falls, lumbar compression fracture, AKI, UTI.  Hospital course was also complicated by delirium/hallucinations. PT OT had recommended SNF but family declined and she was discharged with home health.   11/12, patient presented to the ED with complaint of shortness of breath, leg swelling, worsening anxiety/panic attacks/depression, delirium/hallucinations, insomnia, poor appetite.  Family is having difficulty taking care of her at home. In the ED, vital signs stable.  Labs showing BNP 166.   UA with negative nitrite, small amount of leukocytes, and microscopy showing 6-10 WBCs and rare bacteria.   Chest x-ray showed cardiomegaly with mild diffuse bilateral interstitial opacity consistent with edema or atypical/viral infection; no focal airspace opacity.  Patient was given fentanyl and IV Lasix 40 mg.   Admitted to [facility] On chart review it was noted that patient was having memory issues for last several months.  She was seen by neurology as an outpatient and was planned for an MRI brain which has not been done yet.  Patient lately has been having a lot of panic attacks. For the past few days she noticed that both of her legs are swollen.  She feels winded just walking to her mailbox at home.    In the ED, patient was afebrile, heart rate in 70s and 80s, blood pressure in 140s 150s, breathing on room air. Labs from this morning with negative procalcitonin.  Stable renal  function.  Normal TSH. Echo with EF 60 to 65%.  Subjective: Patient was seen and examined this morning.  Propped up in bed.  Not in distress.  No new symptoms.  No agitation at the time of my evaluation.  Husband not at bedside. Nursing reported multiple episodes of agitation last 24 hours. Psych consult appreciated.  Assessment and plan: Dyspnea on exertion Bilateral pedal edema History of hypertension Presented with dyspnea on exertion, bilateral pedal edema. Chest x-ray showed cardiomegaly with mild diffuse bilateral interstitial opacity suggestive of pulm edema.  Initially there was suspicion of congestive heart failure however echo showed normal EF 60 to 65%, no wall abnormality.   Dyspnea is likely because of anxiety.  For pedal edema, she was diuresed with IV Lasix 40 mg twice daily.  I switched her to oral Lasix today.  Continue metoprolol started at 12.5 mg twice daily.  Acute delirium  progressively worsening cognitive decline Worsening anxiety/panic attacks History of depression Having intermittent episodes of restlessness, agitation in the hospital. TeleSitter ordered.  Okay to use soft restraints, waist restraints. As needed IV Ativan. Last neurology office visit 10/5 and formal neurocognitive testing was not consistent with neurodegenerative disease.  Plan was to obtain a brain MRI for further evaluation which has not been done yet.   MRI brain was attempted on admission but patient did not complete his evaluation.  No intracranial process identified and suboptimal study. On multiple psych meds at home including Remeron, Zyprexa, Zoloft.   Psychiatry consulted.  Recommended inpatient psych.  Defer to psychiatry for med adjustment   Dysuria/ ?UTI UA with negative nitrite, small amount of leukocytes, and microscopy showing 6-10 WBCs  and rare bacteria.  No fever, leukocytosis, or signs of sepsis.  Type 2 diabetes mellitus A1c 5.7 on 10/28/2021 PTA not on meds. Currently  on sliding scale insulin with Accu-Cheks Recent Labs  Lab 11/24/21 1145 11/24/21 1717 11/24/21 2117 11/25/21 0615 11/25/21 1113  GLUCAP 129* 107* 121* 122* 151*   CAD/HLD No anginal symptoms. EKG without ischemic changes Continue Crestor 40 mg daily.  Not on blood thinners  OSA Continue nightly CPAP  Hypothyroidism TSH normal.  Continue Synthroid  Wounds:  - Incision (Closed) 08/15/19 Breast Left (Active)  Date First Assessed/Time First Assessed: 08/15/19 1048   Location: Breast  Location Orientation: Left    Assessments 08/15/2019 11:06 AM 08/15/2019 12:37 PM  Dressing Type Liquid skin adhesive --  Dressing Clean;Dry;Intact --  Site / Wound Assessment Clean;Dry --  Drainage Amount None None     No associated orders.    Discharge Exam:   Vitals:   11/25/21 0030 11/25/21 0528 11/25/21 0735 11/25/21 1119  BP: (!) 144/95  (!) 153/77 115/82  Pulse: 96  87 86  Resp: '17  16 20  '$ Temp: 97.9 F (36.6 C)  97.7 F (36.5 C) 97.7 F (36.5 C)  TempSrc: Oral  Oral Oral  SpO2: 95%  98% 94%  Weight:  87.1 kg    Height:        Body mass index is 34.03 kg/m.  General exam: Pleasant elderly Caucasian female.  Not in pain Skin: No rashes, lesions or ulcers. HEENT: Atraumatic, normocephalic, no obvious bleeding Lungs: Clear to auscultation bilaterally CVS: Regular rate and rhythm, no murmur GI/Abd soft, nontender, nondistended, bowel sound present CNS: Alert, awake, oriented to place only. Psychiatry: Anxious, tearful at times Extremities: Improved bilateral pedal edema, no calf tenderness  Follow ups:    Follow-up Information     [name], MD Follow up.   Specialty: Internal Medicine Contact information: [address] [place] [address] [phone]                 Discharge Instructions:   Discharge Instructions     Call MD for:  difficulty breathing, headache or visual disturbances   Complete by: As directed    Call MD  for:  extreme fatigue   Complete by: As directed    Call MD for:  hives   Complete by: As directed    Call MD for:  persistant dizziness or light-headedness   Complete by: As directed    Call MD for:  persistant nausea and vomiting   Complete by: As directed    Call MD for:  severe uncontrolled pain   Complete by: As directed    Call MD for:  temperature >100.4   Complete by: As directed    Diet general   Complete by: As directed    Discharge instructions   Complete by: As directed    General discharge instructions: Follow with Primary MD [name], MD in 7 days  Please request your PCP  to go over your hospital tests, procedures, radiology results at the follow up. Please get your medicines reviewed and adjusted.  Your PCP may decide to repeat certain labs or tests as needed. Do not drive, operate heavy machinery, perform activities at heights, swimming or participation in water activities or provide baby sitting services if your were admitted for syncope or siezures until you have seen by Primary MD or a Neurologist and advised to do so again. [place] Controlled Substance Reporting System database  was reviewed. Do not drive, operate heavy machinery, perform activities at heights, swim, participate in water activities or provide baby-sitting services while on medications for pain, sleep and mood until your outpatient physician has reevaluated you and advised to do so again.  You are strongly recommended to comply with the dose, frequency and duration of prescribed medications. Activity: As tolerated with Full fall precautions use walker/cane & assistance as needed Avoid using any recreational substances like cigarette, tobacco, alcohol, or non-prescribed drug. If you experience worsening of your admission symptoms, develop shortness of breath, life threatening emergency, suicidal or homicidal thoughts you must seek medical attention immediately by calling 911 or calling  your MD immediately  if symptoms less severe. You must read complete instructions/literature along with all the possible adverse reactions/side effects for all the medicines you take and that have been prescribed to you. Take any new medicine only after you have completely understood and accepted all the possible adverse reactions/side effects.  Wear Seat belts while driving. You were cared for by a hospitalist during your hospital stay. If you have any questions about your discharge medications or the care you received while you were in the hospital after you are discharged, you can call the unit and ask to speak with the hospitalist or the covering physician. Once you are discharged, your primary care physician will handle any further medical issues. Please note that NO REFILLS for any discharge medications will be authorized once you are discharged, as it is imperative that you return to your primary care physician (or establish a relationship with a primary care physician if you do not have one).   Increase activity slowly   Complete by: As directed        Discharge Medications:   Allergies as of 11/25/2021       Reactions   Ceftin [cefuroxime Axetil] Hives, Swelling, Other (See Comments)   Swelling of face   Codeine Nausea Only   Compazine [prochlorperazine] Swelling, Other (See Comments)   Face and tongue swelling        Medication List     STOP taking these medications    hydrOXYzine 25 MG tablet Commonly known as: ATARAX       TAKE these medications    CALCIUM-VITAMIN D PO Take 1 capsule by mouth daily. 1200-1000 calcium-vitamin d   fluticasone 50 MCG/ACT nasal spray Commonly known as: FLONASE Place 2 sprays into both nostrils daily.   furosemide 20 MG tablet Commonly known as: LASIX Take 1 tablet (20 mg total) by mouth daily. Start taking on: November 26, 2021   levothyroxine 137 MCG tablet Commonly known as: SYNTHROID Take 137 mcg by mouth daily before  breakfast.   Magnesium 100 MG Caps Take 100 mg by mouth daily.   metoprolol tartrate 25 MG tablet Commonly known as: LOPRESSOR TAKE 1/2 TO 1 TABLET IN THE MORNING AND AT BEDTIME FOR PALPITATIONS. NEED APPOINTMENT FOR FURTHER REFILLS What changed: See the new instructions.   mirtazapine 15 MG tablet Commonly known as: REMERON Take 1 tablet (15 mg total) by mouth at bedtime.   nitroGLYCERIN 0.4 MG SL tablet Commonly known as: NITROSTAT Place 0.4 mg under the tongue every 5 (five) minutes as needed for chest pain.   OLANZapine 10 MG tablet Commonly known as: ZyPREXA Take 1 tablet (10 mg total) by mouth at bedtime. What changed: Another medication with the same name was removed. Continue taking this medication, and follow the directions you see here.   ondansetron 4 MG  tablet Commonly known as: ZOFRAN Take 4 mg by mouth every 8 (eight) hours as needed for nausea or vomiting.   rosuvastatin 40 MG tablet Commonly known as: CRESTOR Take 1 tablet (40 mg total) by mouth daily.   sertraline 50 MG tablet Commonly known as: Zoloft Take 1 tablet (50 mg total) by mouth daily. Take 1/2 tablet 25 mg for 7 days, then take one whole tablet 50 mg total daily after breakfast What changed:  when to take this additional instructions   Vitamin D (Ergocalciferol) 1.25 MG (50000 UNIT) Caps capsule Commonly known as: DRISDOL Take 50,000 Units by mouth every Wednesday.         The results of significant diagnostics from this hospitalization (including imaging, microbiology, ancillary and laboratory) are listed below for reference.    Procedures and Diagnostic Studies:   ECHOCARDIOGRAM COMPLETE  Result Date: 11/24/2021    ECHOCARDIOGRAM REPORT   Patient Name:   Danielle [name] Date of Exam: 11/24/2021 Medical Rec #:  [id number]          Height:       63.0 in Accession #:    [id number]         Weight:       201.3 lb Date of Birth:  02-03-1945          BSA:          1.939 m Patient Age:     31 years           BP:           150/80 mmHg Patient Gender: F                  HR:           88 bpm. Exam Location:  Inpatient Procedure: 2D Echo, Cardiac Doppler, Color Doppler and Intracardiac            Opacification Agent Indications:    CHF-Acute Systolic H41.74  History:        Patient has no prior history of Echocardiogram examinations.                 CHF, CAD, Mitral Valve Disease; Risk Factors:Sleep Apnea,                 Hypertension, Dyslipidemia and Diabetes.  Sonographer:    Greer Pickerel Referring Phys: [id number] [place]  Sonographer Comments: Suboptimal subcostal window and suboptimal apical window. Image acquisition challenging due to patient body habitus and Image acquisition challenging due to respiratory motion. IMPRESSIONS  1. Left ventricular ejection fraction, by estimation, is 60 to 65%. The left ventricle has normal function. The left ventricle has no regional wall motion abnormalities. Left ventricular diastolic parameters were normal.  2. Right ventricular systolic function is normal. The right ventricular size is normal.  3. The mitral valve is abnormal. Trivial mitral valve regurgitation. No evidence of mitral stenosis.  4. The aortic valve was not well visualized. There is moderate calcification of the aortic valve. There is moderate thickening of the aortic valve. Aortic valve regurgitation is not visualized. Aortic valve sclerosis/calcification is present, without any evidence of aortic stenosis.  5. The inferior vena cava is normal in size with greater than 50% respiratory variability, suggesting right atrial pressure of 3 mmHg. FINDINGS  Left Ventricle: Left ventricular ejection fraction, by estimation, is 60 to 65%. The left ventricle has normal function. The left ventricle has no regional wall motion abnormalities. Definity contrast agent was given IV  to delineate the left ventricular  endocardial borders. The left ventricular internal cavity size was normal in size. There  is no left ventricular hypertrophy. Left ventricular diastolic parameters were normal. Right Ventricle: The right ventricular size is normal. No increase in right ventricular wall thickness. Right ventricular systolic function is normal. Left Atrium: Left atrial size was normal in size. Right Atrium: Right atrial size was normal in size. Pericardium: There is no evidence of pericardial effusion. Mitral Valve: The mitral valve is abnormal. There is moderate thickening of the mitral valve leaflet(s). There is mild calcification of the mitral valve leaflet(s). Trivial mitral valve regurgitation. No evidence of mitral valve stenosis. Tricuspid Valve: The tricuspid valve is normal in structure. Tricuspid valve regurgitation is trivial. No evidence of tricuspid stenosis. Aortic Valve: The aortic valve was not well visualized. There is moderate calcification of the aortic valve. There is moderate thickening of the aortic valve. Aortic valve regurgitation is not visualized. Aortic valve sclerosis/calcification is present, without any evidence of aortic stenosis. Pulmonic Valve: The pulmonic valve was normal in structure. Pulmonic valve regurgitation is trivial. No evidence of pulmonic stenosis. Aorta: The aortic root is normal in size and structure. Venous: The inferior vena cava is normal in size with greater than 50% respiratory variability, suggesting right atrial pressure of 3 mmHg. IAS/Shunts: The interatrial septum was not well visualized.  LEFT VENTRICLE PLAX 2D LVIDd:         4.90 cm   Diastology LVIDs:         3.70 cm   LV e' medial:    5.55 cm/s LV PW:         0.90 cm   LV E/e' medial:  17.1 LV IVS:        1.00 cm   LV e' lateral:   7.83 cm/s LVOT diam:     1.90 cm   LV E/e' lateral: 12.1 LV SV:         67 LV SV Index:   35 LVOT Area:     2.84 cm  RIGHT VENTRICLE RV S prime:     9.79 cm/s TAPSE (M-mode): 2.4 cm LEFT ATRIUM             Index        RIGHT ATRIUM           Index LA diam:        3.40 cm 1.75 cm/m    RA Area:     17.30 cm LA Vol (A2C):   67.8 ml 34.97 ml/m  RA Volume:   44.00 ml  22.69 ml/m LA Vol (A4C):   51.5 ml 26.56 ml/m LA Biplane Vol: 61.5 ml 31.72 ml/m  AORTIC VALVE             PULMONIC VALVE LVOT Vmax:   102.00 cm/s PR End Diast Vel: 3.34 msec LVOT Vmean:  67.400 cm/s LVOT VTI:    0.237 m  AORTA Ao Root diam: 3.20 cm Ao Asc diam:  3.40 cm MITRAL VALVE MV Area (PHT): 4.31 cm     SHUNTS MV Decel Time: 176 msec     Systemic VTI:  0.24 m MV E velocity: 94.80 cm/s   Systemic Diam: 1.90 cm MV A velocity: 134.00 cm/s MV E/A ratio:  0.71 Jenkins Rouge MD Electronically signed by Jenkins Rouge MD Signature Date/Time: 11/24/2021/1:05:14 PM    Final    MR BRAIN WO CONTRAST  Result Date: 11/24/2021 CLINICAL DATA:  Delirium EXAM: MRI HEAD WITHOUT CONTRAST TECHNIQUE:  Multiplanar, multiecho pulse sequences of the brain and surrounding structures were obtained without intravenous contrast. COMPARISON:  No prior MRI, correlation is made with CT head 10/27/2021 FINDINGS: Evaluation is limited as patient declined to complete the exam. Axial and coronal T2 and sagittal and axial T1 sequences could not be obtained. Brain: No restricted diffusion to suggest acute or subacute infarct. No acute hemorrhage, mass, mass effect, or midline shift. No hydrocephalus or extra-axial collection. No hemosiderin deposition to suggest remote hemorrhage. Scattered T2 hyperintense signal in the periventricular white matter, likely the sequela of mild chronic small vessel ischemic disease. Vascular: Limited by the absence of the aforementioned sequences. Internal carotid artery flow voids appear patent. Skull and upper cervical spine: Normal marrow signal on FLAIR sequence. No diffusion restricting lesions. Sinuses/Orbits: Clear sinuses. No acute finding in the orbits on FLAIR sequence. Other: None. IMPRESSION: Limited evaluation as patient declined complete the exam. Within this limitation, there is no acute intracranial process. No  evidence of acute or subacute infarct. Electronically Signed   By: Merilyn Baba M.D.   On: 11/24/2021 00:26   DG Chest 2 View  Result Date: 11/23/2021 CLINICAL DATA:  Shortness of breath EXAM: CHEST - 2 VIEW COMPARISON:  10/27/2021 FINDINGS: Cardiomegaly. Mild, diffuse bilateral interstitial opacity. Disc degenerative disease of the thoracic spine. IMPRESSION: Cardiomegaly with mild, diffuse bilateral interstitial opacity, consistent with edema or atypical/viral infection. No focal airspace opacity. Electronically Signed   By: Delanna Ahmadi M.D.   On: 11/23/2021 17:19     Labs:   Basic Metabolic Panel: Recent Labs  Lab 11/23/21 1602 11/24/21 0105  NA 143 144  K 3.7 3.7  CL 111 113*  CO2 25 24  GLUCOSE 108* 106*  BUN 7* 6*  CREATININE 1.01* 1.02*  CALCIUM 8.8* 9.2   GFR Estimated Creatinine Clearance: 49.9 mL/min (A) (by C-G formula based on SCr of 1.02 mg/dL (H)). Liver Function Tests: Recent Labs  Lab 11/23/21 1602  AST 23  ALT 19  ALKPHOS 105  BILITOT 0.9  PROT 6.6  ALBUMIN 3.6   No results for input(s): "LIPASE", "AMYLASE" in the last 168 hours. No results for input(s): "AMMONIA" in the last 168 hours. Coagulation profile No results for input(s): "INR", "PROTIME" in the last 168 hours.  CBC: Recent Labs  Lab 11/23/21 1602  WBC 7.1  NEUTROABS 3.9  HGB 12.7  HCT 38.6  MCV 91.5  PLT 274   Cardiac Enzymes: No results for input(s): "CKTOTAL", "CKMB", "CKMBINDEX", "TROPONINI" in the last 168 hours. BNP: Invalid input(s): "POCBNP" CBG: Recent Labs  Lab 11/24/21 1145 11/24/21 1717 11/24/21 2117 11/25/21 0615 11/25/21 1113  GLUCAP 129* 107* 121* 122* 151*   D-Dimer No results for input(s): "DDIMER" in the last 72 hours. Hgb A1c No results for input(s): "HGBA1C" in the last 72 hours. Lipid Profile No results for input(s): "CHOL", "HDL", "LDLCALC", "TRIG", "CHOLHDL", "LDLDIRECT" in the last 72 hours. Thyroid function studies Recent Labs     11/24/21 0105  TSH 2.543   Anemia work up No results for input(s): "VITAMINB12", "FOLATE", "FERRITIN", "TIBC", "IRON", "RETICCTPCT" in the last 72 hours. Microbiology Recent Results (from the past 240 hour(s))  Respiratory (~20 pathogens) panel by PCR     Status: None   Collection Time: 11/23/21 10:35 PM   Specimen: Nasopharyngeal Swab; Respiratory  Result Value Ref Range Status   Adenovirus NOT DETECTED NOT DETECTED Final   Coronavirus 229E NOT DETECTED NOT DETECTED Final    Comment: (NOTE) The Coronavirus on the  Respiratory Panel, DOES NOT test for the novel  Coronavirus (2019 nCoV)    Coronavirus HKU1 NOT DETECTED NOT DETECTED Final   Coronavirus NL63 NOT DETECTED NOT DETECTED Final   Coronavirus OC43 NOT DETECTED NOT DETECTED Final   Metapneumovirus NOT DETECTED NOT DETECTED Final   Rhinovirus / Enterovirus NOT DETECTED NOT DETECTED Final   Influenza A NOT DETECTED NOT DETECTED Final   Influenza B NOT DETECTED NOT DETECTED Final   Parainfluenza Virus 1 NOT DETECTED NOT DETECTED Final   Parainfluenza Virus 2 NOT DETECTED NOT DETECTED Final   Parainfluenza Virus 3 NOT DETECTED NOT DETECTED Final   Parainfluenza Virus 4 NOT DETECTED NOT DETECTED Final   Respiratory Syncytial Virus NOT DETECTED NOT DETECTED Final   Bordetella pertussis NOT DETECTED NOT DETECTED Final   Bordetella Parapertussis NOT DETECTED NOT DETECTED Final   Chlamydophila pneumoniae NOT DETECTED NOT DETECTED Final   Mycoplasma pneumoniae NOT DETECTED NOT DETECTED Final    Comment: Performed at [facility], [place] [address], [place], [place] [address]  SARS Coronavirus 2 by RT PCR (hospital order, performed in [facility] hospital lab) *cepheid single result test* Anterior Nasal Swab     Status: None   Collection Time: 11/23/21 10:35 PM   Specimen: Anterior Nasal Swab  Result Value Ref Range Status   SARS Coronavirus 2 by RT PCR NEGATIVE NEGATIVE Final    Comment: (NOTE) SARS-CoV-2 target nucleic acids  are NOT DETECTED.  The SARS-CoV-2 RNA is generally detectable in upper and lower respiratory specimens during the acute phase of infection. The lowest concentration of SARS-CoV-2 viral copies this assay can detect is 250 copies / mL. A negative result does not preclude SARS-CoV-2 infection and should not be used as the sole basis for treatment or other patient management decisions.  A negative result may occur with improper specimen collection / handling, submission of specimen other than nasopharyngeal swab, presence of viral mutation(s) within the areas targeted by this assay, and inadequate number of viral copies (<250 copies / mL). A negative result must be combined with clinical observations, patient history, and epidemiological information.  Fact Sheet for Patients:   https://www.patel.info/  Fact Sheet for Healthcare Providers: https://hall.com/  This test is not yet approved or  cleared by the [place] FDA and has been authorized for detection and/or diagnosis of SARS-CoV-2 by FDA under an Emergency Use Authorization (EUA).  This EUA will remain in effect (meaning this test can be used) for the duration of the COVID-19 declaration under Section 564(b)(1) of the Act, 21 U.S.C. section 360bbb-3(b)(1), unless the authorization is terminated or revoked sooner.  Performed at [facility], [place] [address], [place], [place] [address]     Time coordinating discharge: 35 minutes  Signed: Ransom Nickson  [facility] 11/25/2021, 3:37 PM

## 2021-11-25 NOTE — Progress Notes (Signed)
[facility]: Number for report: [phone]  Room: L31

## 2021-11-26 ENCOUNTER — Other Ambulatory Visit: Payer: Self-pay

## 2021-11-26 ENCOUNTER — Encounter: Payer: Self-pay | Admitting: Psychiatry

## 2021-11-26 ENCOUNTER — Inpatient Hospital Stay
Admission: AD | Admit: 2021-11-26 | Discharge: 2021-12-03 | DRG: 885 | Disposition: A | Discharge status: Home / Self Care | Payer: Medicare HMO | Source: Intra-hospital | Attending: Psychiatry | Admitting: Psychiatry

## 2021-11-26 DIAGNOSIS — R0609 Other forms of dyspnea: Secondary | ICD-10-CM | POA: Diagnosis not present

## 2021-11-26 DIAGNOSIS — Z881 Allergy status to other antibiotic agents status: Secondary | ICD-10-CM

## 2021-11-26 DIAGNOSIS — F410 Panic disorder [episodic paroxysmal anxiety] without agoraphobia: Secondary | ICD-10-CM | POA: Diagnosis present

## 2021-11-26 DIAGNOSIS — E039 Hypothyroidism, unspecified: Secondary | ICD-10-CM | POA: Diagnosis not present

## 2021-11-26 DIAGNOSIS — F419 Anxiety disorder, unspecified: Secondary | ICD-10-CM | POA: Diagnosis not present

## 2021-11-26 DIAGNOSIS — F32A Depression, unspecified: Secondary | ICD-10-CM

## 2021-11-26 DIAGNOSIS — F411 Generalized anxiety disorder: Secondary | ICD-10-CM

## 2021-11-26 DIAGNOSIS — F418 Other specified anxiety disorders: Secondary | ICD-10-CM

## 2021-11-26 DIAGNOSIS — Z888 Allergy status to other drugs, medicaments and biological substances status: Secondary | ICD-10-CM

## 2021-11-26 DIAGNOSIS — G4733 Obstructive sleep apnea (adult) (pediatric): Secondary | ICD-10-CM | POA: Diagnosis present

## 2021-11-26 DIAGNOSIS — R4189 Other symptoms and signs involving cognitive functions and awareness: Secondary | ICD-10-CM

## 2021-11-26 DIAGNOSIS — H409 Unspecified glaucoma: Secondary | ICD-10-CM | POA: Diagnosis present

## 2021-11-26 DIAGNOSIS — Z885 Allergy status to narcotic agent status: Secondary | ICD-10-CM | POA: Diagnosis not present

## 2021-11-26 DIAGNOSIS — Z79890 Hormone replacement therapy (postmenopausal): Secondary | ICD-10-CM

## 2021-11-26 DIAGNOSIS — Z1152 Encounter for screening for COVID-19: Secondary | ICD-10-CM | POA: Diagnosis not present

## 2021-11-26 DIAGNOSIS — F0394 Unspecified dementia, unspecified severity, with anxiety: Secondary | ICD-10-CM | POA: Diagnosis present

## 2021-11-26 DIAGNOSIS — I2510 Atherosclerotic heart disease of native coronary artery without angina pectoris: Secondary | ICD-10-CM | POA: Diagnosis not present

## 2021-11-26 DIAGNOSIS — R296 Repeated falls: Secondary | ICD-10-CM | POA: Diagnosis present

## 2021-11-26 DIAGNOSIS — E7800 Pure hypercholesterolemia, unspecified: Secondary | ICD-10-CM | POA: Diagnosis present

## 2021-11-26 DIAGNOSIS — I340 Nonrheumatic mitral (valve) insufficiency: Secondary | ICD-10-CM | POA: Diagnosis not present

## 2021-11-26 DIAGNOSIS — Z79899 Other long term (current) drug therapy: Secondary | ICD-10-CM | POA: Diagnosis not present

## 2021-11-26 DIAGNOSIS — I10 Essential (primary) hypertension: Secondary | ICD-10-CM | POA: Diagnosis not present

## 2021-11-26 DIAGNOSIS — G4700 Insomnia, unspecified: Secondary | ICD-10-CM | POA: Diagnosis present

## 2021-11-26 DIAGNOSIS — F0390 Unspecified dementia without behavioral disturbance: Secondary | ICD-10-CM | POA: Diagnosis not present

## 2021-11-26 DIAGNOSIS — R410 Disorientation, unspecified: Secondary | ICD-10-CM

## 2021-11-26 DIAGNOSIS — F0393 Unspecified dementia, unspecified severity, with mood disturbance: Secondary | ICD-10-CM | POA: Diagnosis not present

## 2021-11-26 DIAGNOSIS — E119 Type 2 diabetes mellitus without complications: Secondary | ICD-10-CM | POA: Diagnosis present

## 2021-11-26 DIAGNOSIS — F323 Major depressive disorder, single episode, severe with psychotic features: Principal | ICD-10-CM | POA: Diagnosis present

## 2021-11-26 DIAGNOSIS — R419 Unspecified symptoms and signs involving cognitive functions and awareness: Secondary | ICD-10-CM

## 2021-11-26 DIAGNOSIS — K219 Gastro-esophageal reflux disease without esophagitis: Secondary | ICD-10-CM | POA: Diagnosis present

## 2021-11-26 DIAGNOSIS — Z96643 Presence of artificial hip joint, bilateral: Secondary | ICD-10-CM | POA: Diagnosis present

## 2021-11-26 DIAGNOSIS — F0392 Unspecified dementia, unspecified severity, with psychotic disturbance: Secondary | ICD-10-CM | POA: Diagnosis present

## 2021-11-26 LAB — GLUCOSE, CAPILLARY
Glucose-Capillary: 74 mg/dL (ref 70–99)
Glucose-Capillary: 92 mg/dL (ref 70–99)
Glucose-Capillary: 106 mg/dL — ABNORMAL HIGH (ref 70–99)

## 2021-11-26 MED ORDER — SENNOSIDES-DOCUSATE SODIUM 8.6-50 MG PO TABS: 1.0000 | ORAL_TABLET | Freq: Every day | ORAL | Status: DC

## 2021-11-26 MED ORDER — RISPERIDONE 1 MG PO TABS: 2.0000 mg | ORAL_TABLET | Freq: Four times a day (QID) | ORAL | Status: DC | PRN

## 2021-11-26 MED ORDER — FUROSEMIDE 20 MG PO TABS
20.0000 mg | ORAL_TABLET | Freq: Every day | ORAL | Status: DC
  Administered 2021-11-27 – 2021-11-28 (×2): 20 mg via ORAL
  Filled 2021-11-26 (×2): qty 1

## 2021-11-26 MED ORDER — LORAZEPAM 1 MG PO TABS
1.0000 mg | ORAL_TABLET | ORAL | Status: DC | PRN
  Administered 2021-11-26 – 2021-12-02 (×10): 1 mg via ORAL
  Filled 2021-11-26 (×10): qty 1

## 2021-11-26 MED ORDER — ROSUVASTATIN CALCIUM 20 MG PO TABS
40.0000 mg | ORAL_TABLET | Freq: Every day | ORAL | Status: DC
  Administered 2021-11-27 – 2021-12-03 (×7): 40 mg via ORAL
  Filled 2021-11-26 (×7): qty 2

## 2021-11-26 MED ORDER — PANTOPRAZOLE SODIUM 40 MG PO TBEC
40.0000 mg | DELAYED_RELEASE_TABLET | Freq: Every day | ORAL | Status: DC
  Administered 2021-11-27 – 2021-12-03 (×7): 40 mg via ORAL
  Filled 2021-11-26 (×8): qty 1

## 2021-11-26 MED ORDER — TRAZODONE HCL 100 MG PO TABS
100.0000 mg | ORAL_TABLET | Freq: Every evening | ORAL | Status: DC | PRN
  Administered 2021-11-29 – 2021-12-02 (×4): 100 mg via ORAL
  Filled 2021-11-26 (×5): qty 1

## 2021-11-26 MED ORDER — ACETAMINOPHEN 325 MG PO TABS
650.0000 mg | ORAL_TABLET | Freq: Four times a day (QID) | ORAL | Status: DC | PRN
  Administered 2021-11-27: 650 mg via ORAL
  Filled 2021-11-26 (×2): qty 2

## 2021-11-26 MED ORDER — ONDANSETRON HCL 4 MG PO TABS
4.0000 mg | ORAL_TABLET | Freq: Three times a day (TID) | ORAL | Status: DC | PRN
  Administered 2021-11-26 – 2021-12-03 (×6): 4 mg via ORAL
  Filled 2021-11-26 (×6): qty 1

## 2021-11-26 MED ORDER — NITROGLYCERIN 0.4 MG SL SUBL: 0.4000 mg | SUBLINGUAL_TABLET | SUBLINGUAL | Status: DC | PRN

## 2021-11-26 MED ORDER — MIRTAZAPINE 15 MG PO TABS
15.0000 mg | ORAL_TABLET | Freq: Every day | ORAL | Status: DC
  Administered 2021-11-26 – 2021-12-01 (×6): 15 mg via ORAL
  Filled 2021-11-26 (×6): qty 1

## 2021-11-26 MED ORDER — LEVOTHYROXINE SODIUM 75 MCG PO TABS
150.0000 ug | ORAL_TABLET | Freq: Every day | ORAL | Status: DC
  Administered 2021-11-27 – 2021-12-03 (×7): 150 ug via ORAL
  Filled 2021-11-26 (×7): qty 2

## 2021-11-26 MED ORDER — METOPROLOL TARTRATE 25 MG PO TABS
25.0000 mg | ORAL_TABLET | Freq: Every day | ORAL | Status: DC
  Administered 2021-11-26: 25 mg via ORAL
  Filled 2021-11-26 (×2): qty 1

## 2021-11-26 MED ORDER — POLYETHYLENE GLYCOL 3350 17 G PO PACK
17.0000 g | PACK | Freq: Every day | ORAL | Status: DC
  Administered 2021-11-27 – 2021-12-03 (×7): 17 g via ORAL
  Filled 2021-11-26 (×8): qty 1

## 2021-11-26 MED ORDER — MAGNESIUM HYDROXIDE 400 MG/5ML PO SUSP
30.0000 mL | Freq: Every day | ORAL | Status: DC | PRN
  Administered 2021-11-28: 30 mL via ORAL
  Filled 2021-11-26: qty 30

## 2021-11-26 MED ORDER — FLUOXETINE HCL 20 MG PO CAPS
20.0000 mg | ORAL_CAPSULE | Freq: Every day | ORAL | Status: DC
  Administered 2021-11-27 – 2021-12-03 (×7): 20 mg via ORAL
  Filled 2021-11-26 (×7): qty 1

## 2021-11-26 MED ORDER — POLYETHYLENE GLYCOL 3350 17 G PO PACK
17.0000 g | PACK | Freq: Two times a day (BID) | ORAL | Status: DC
  Administered 2021-11-26: 17 g via ORAL
  Filled 2021-11-26: qty 1

## 2021-11-26 MED ORDER — FLUTICASONE PROPIONATE 50 MCG/ACT NA SUSP
2.0000 | Freq: Every day | NASAL | Status: DC
  Administered 2021-11-27 – 2021-12-03 (×7): 2 via NASAL
  Filled 2021-11-26: qty 16

## 2021-11-26 MED ORDER — ALUM & MAG HYDROXIDE-SIMETH 200-200-20 MG/5ML PO SUSP: 30.0000 mL | ORAL | Status: DC | PRN

## 2021-11-26 MED ORDER — OLANZAPINE 5 MG PO TABS
10.0000 mg | ORAL_TABLET | Freq: Every day | ORAL | Status: DC
  Administered 2021-11-26 – 2021-12-01 (×6): 10 mg via ORAL
  Filled 2021-11-26 (×6): qty 2

## 2021-11-26 MED ORDER — SENNOSIDES-DOCUSATE SODIUM 8.6-50 MG PO TABS
1.0000 | ORAL_TABLET | Freq: Two times a day (BID) | ORAL | Status: DC
  Administered 2021-11-26: 1 via ORAL
  Filled 2021-11-26: qty 1

## 2021-11-26 MED ORDER — POLYETHYLENE GLYCOL 3350 17 G PO PACK: 17.0000 g | PACK | Freq: Every day | ORAL | 0 refills | Status: DC

## 2021-11-26 NOTE — Progress Notes (Signed)
Mobility Specialist Progress Note:   11/26/21 1103  Mobility  Activity Ambulated with assistance in room;Transferred to/from [facility]  Level of Assistance Contact guard assist, steadying assist  Assistive Device Front wheel walker  Distance Ambulated (ft) 30 ft  Activity Response Tolerated well  $Mobility charge 1 Mobility   Pt received in bed asking to get up to sink to wash up. No complaints of pain. Left in bed with call bell in reach and all needs met.   Gareth Eagle Mykel Mohl Mobility Specialist Please contact via [employer] or  Rehab Office at [phone]

## 2021-11-26 NOTE — Discharge Summary (Addendum)
Physician Discharge Summary  Danielle [name] IRS:[id number] DOB: 05/16/1945 DOA: 11/23/2021  PCP: [name], MD  Admit date: 11/23/2021 Discharge date: 11/26/2021  Admitted From: Home Discharge disposition: Inpatient psych  Recommendations at discharge:  Per Psychiatry at facility Will need close PCP follow up within 1 week of discharge     Brief narrative: Danielle [name] is a 76 y.o. female with PMH significant for DM2, HTN, HLD, CAD, cognitive decline/memory loss, depression/anxiety, hypothyroidism, OSA on CPAP who was recently hospitalized 10/16-10/24 for frequent falls, lumbar compression fracture, AKI, UTI.  Hospital course was also complicated by delirium/hallucinations. PT OT had recommended SNF but family declined and she was discharged with home health.   11/12, patient presented to the ED with complaint of shortness of breath, leg swelling, worsening anxiety/panic attacks/depression, delirium/hallucinations, insomnia, poor appetite.  Family is having difficulty taking care of her at home. In the ED, vital signs stable.  Labs showing BNP 166.   UA with negative nitrite, small amount of leukocytes, and microscopy showing 6-10 WBCs and rare bacteria.   Chest x-ray showed cardiomegaly with mild diffuse bilateral interstitial opacity consistent with edema or atypical/viral infection; no focal airspace opacity.  Patient was given fentanyl and IV Lasix 40 mg.   Admitted to [facility] On chart review it was noted that patient was having memory issues for last several months.  She was seen by neurology as an outpatient and was planned for an MRI brain which has not been done yet.  Patient lately has been having a lot of panic attacks. For the past few days she noticed that both of her legs are swollen.  She feels winded just walking to her mailbox at home.    In the ED, patient was afebrile, heart rate in 70s and 80s, blood pressure in 140s 150s, breathing on room  air. Labs from this morning with negative procalcitonin.  Stable renal function.  Normal TSH. Echo with EF 60 to 65%.  Subjective: Patient was seen and examined this morning.  Propped up in bed.  Not in distress.  No new symptoms.  No agitation at the time of my evaluation.  Husband not at bedside. Nursing reported multiple episodes of agitation last 24 hours. Psych consult appreciated.   Assessment and plan: Dyspnea on exertion Bilateral pedal edema History of hypertension Presented with dyspnea on exertion, bilateral pedal edema. Chest x-ray showed cardiomegaly with mild diffuse bilateral interstitial opacity suggestive of pulm edema.  Initially there was suspicion of congestive heart failure however echo showed normal EF 60 to 65%, no wall abnormality.   Dyspnea is likely because of anxiety.  For pedal edema, she was diuresed with IV Lasix 40 mg twice daily. She was switched to oral Lasix yesterday at 20 mg po daily.  Continue metoprolol started at 12.5 mg twice daily. -She will need PCP follow-up as well as cardiology follow-up if necessary  Acute delirium  progressively worsening cognitive decline Worsening anxiety/panic attacks History of depression Having intermittent episodes of restlessness, agitation in the hospital. TeleSitter ordered.  Okay to use soft restraints, waist restraints. As needed IV Ativan. Last neurology office visit 10/5 and formal neurocognitive testing was not consistent with neurodegenerative disease.  Plan was to obtain a brain MRI for further evaluation which has not been done yet.   MRI brain was attempted on admission but patient did not complete his evaluation.  No intracranial process identified and suboptimal study. On multiple psych meds at home including Remeron, Zyprexa, Zoloft.  Psychiatry consulted.  Recommended inpatient psych.  Defer to psychiatry for med adjustment -Today she feels "disoriented" however she remains medically stable for  discharge   Dysuria/ ?UTI UA with negative nitrite, small amount of leukocytes, and microscopy showing 6-10 WBCs and rare bacteria.  No fever, leukocytosis, or signs of sepsis.  Type 2 diabetes mellitus A1c 5.7 on 10/28/2021 PTA not on meds. Currently on sliding scale insulin with Accu-Cheks Recent Labs  Lab 11/25/21 0615 11/25/21 1113 11/25/21 1744 11/25/21 2047 11/26/21 0623  GLUCAP 122* 151* 85 74 92   CAD/HLD No anginal symptoms. EKG without ischemic changes Continue Crestor 40 mg daily.  Not on blood thinners  OSA Continue nightly CPAP  Hypothyroidism TSH normal.  Continue Synthroid  GERD/GI Prophylaxis -C/w Pantoprazole 40 mg po Daily   Obesity -Complicates overall prognosis and care -Estimated body mass index is 35.97 kg/m as calculated from the following:   Height as of this encounter: '5\' 3"'$  (1.6 m).   Weight as of this encounter: 92.1 kg.  -Weight Loss and Dietary Counseling given  Constipation -Initiate Stool Softeners   Wounds:  - Incision (Closed) 08/15/19 Breast Left (Active)  Date First Assessed/Time First Assessed: 08/15/19 1048   Location: Breast  Location Orientation: Left    Assessments 08/15/2019 11:06 AM 08/15/2019 12:37 PM  Dressing Type Liquid skin adhesive --  Dressing Clean;Dry;Intact --  Site / Wound Assessment Clean;Dry --  Drainage Amount None None     No associated orders.    ADDENDUM 11/26/21: She was medically stable to be discharged on 11/25/2021 however was not discharged to [facility] due to not being available close to the nurses station however it is available today and she remains medically stable for discharge  Discharge Exam:   Vitals:   11/25/21 2004 11/25/21 2348 11/26/21 0456 11/26/21 0836  BP: (!) 125/97 (!) 113/46 139/74 (!) 105/47  Pulse: 70 71 70 93  Resp: '17 18 13 16  '$ Temp: 97.8 F (36.6 C) 98 F (36.7 C) 97.7 F (36.5 C) 98.4 F (36.9 C)  TempSrc: Axillary Oral Axillary Oral   SpO2: 94% 99% 97% 96%  Weight:   92.1 kg   Height:        Body mass index is 35.97 kg/m.  General exam: Pleasant elderly Caucasian female.  Not in pain Skin: No rashes, lesions or ulcers. HEENT: Atraumatic, normocephalic, no obvious bleeding Lungs: Clear to auscultation bilaterally CVS: Regular rate and rhythm, no murmur GI/Abd soft, nontender, nondistended, bowel sound present CNS: Alert, awake, oriented to place only. Psychiatry: Anxious, tearful at times Extremities: Improved bilateral pedal edema, no calf tenderness  Follow ups:    Follow-up Information     [name], MD Follow up.   Specialty: Internal Medicine Contact information: [address] [place] [address] [phone]                 Discharge Instructions:   Discharge Instructions     Call MD for:  difficulty breathing, headache or visual disturbances   Complete by: As directed    Call MD for:  extreme fatigue   Complete by: As directed    Call MD for:  hives   Complete by: As directed    Call MD for:  persistant dizziness or light-headedness   Complete by: As directed    Call MD for:  persistant nausea and vomiting   Complete by: As directed    Call MD for:  severe uncontrolled pain  Complete by: As directed    Call MD for:  temperature >100.4   Complete by: As directed    Diet general   Complete by: As directed    Discharge instructions   Complete by: As directed    General discharge instructions: Follow with Primary MD [name], MD in 7 days  Please request your PCP  to go over your hospital tests, procedures, radiology results at the follow up. Please get your medicines reviewed and adjusted.  Your PCP may decide to repeat certain labs or tests as needed. Do not drive, operate heavy machinery, perform activities at heights, swimming or participation in water activities or provide baby sitting services if your were admitted for syncope or  siezures until you have seen by Primary MD or a Neurologist and advised to do so again. [place] Controlled Substance Reporting System database was reviewed. Do not drive, operate heavy machinery, perform activities at heights, swim, participate in water activities or provide baby-sitting services while on medications for pain, sleep and mood until your outpatient physician has reevaluated you and advised to do so again.  You are strongly recommended to comply with the dose, frequency and duration of prescribed medications. Activity: As tolerated with Full fall precautions use walker/cane & assistance as needed Avoid using any recreational substances like cigarette, tobacco, alcohol, or non-prescribed drug. If you experience worsening of your admission symptoms, develop shortness of breath, life threatening emergency, suicidal or homicidal thoughts you must seek medical attention immediately by calling 911 or calling your MD immediately  if symptoms less severe. You must read complete instructions/literature along with all the possible adverse reactions/side effects for all the medicines you take and that have been prescribed to you. Take any new medicine only after you have completely understood and accepted all the possible adverse reactions/side effects.  Wear Seat belts while driving. You were cared for by a hospitalist during your hospital stay. If you have any questions about your discharge medications or the care you received while you were in the hospital after you are discharged, you can call the unit and ask to speak with the hospitalist or the covering physician. Once you are discharged, your primary care physician will handle any further medical issues. Please note that NO REFILLS for any discharge medications will be authorized once you are discharged, as it is imperative that you return to your primary care physician (or establish a relationship with a primary care physician if you do not  have one).   Increase activity slowly   Complete by: As directed        Discharge Medications:   Allergies as of 11/26/2021       Reactions   Ceftin [cefuroxime Axetil] Hives, Swelling, Other (See Comments)   Swelling of face   Codeine Nausea Only   Compazine [prochlorperazine] Swelling, Other (See Comments)   Face and tongue swelling        Medication List     STOP taking these medications    hydrOXYzine 25 MG tablet Commonly known as: ATARAX       TAKE these medications    CALCIUM-VITAMIN D PO Take 1 capsule by mouth daily. 1200-1000 calcium-vitamin d   fluticasone 50 MCG/ACT nasal spray Commonly known as: FLONASE Place 2 sprays into both nostrils daily.   furosemide 20 MG tablet Commonly known as: LASIX Take 1 tablet (20 mg total) by mouth daily.   levothyroxine 137 MCG tablet Commonly known as: SYNTHROID Take 137 mcg by mouth  daily before breakfast.   Magnesium 100 MG Caps Take 100 mg by mouth daily.   metoprolol tartrate 25 MG tablet Commonly known as: LOPRESSOR TAKE 1/2 TO 1 TABLET IN THE MORNING AND AT BEDTIME FOR PALPITATIONS. NEED APPOINTMENT FOR FURTHER REFILLS What changed: See the new instructions.   mirtazapine 15 MG tablet Commonly known as: REMERON Take 1 tablet (15 mg total) by mouth at bedtime.   nitroGLYCERIN 0.4 MG SL tablet Commonly known as: NITROSTAT Place 0.4 mg under the tongue every 5 (five) minutes as needed for chest pain.   OLANZapine 10 MG tablet Commonly known as: ZyPREXA Take 1 tablet (10 mg total) by mouth at bedtime. What changed: Another medication with the same name was removed. Continue taking this medication, and follow the directions you see here.   ondansetron 4 MG tablet Commonly known as: ZOFRAN Take 4 mg by mouth every 8 (eight) hours as needed for nausea or vomiting.   polyethylene glycol 17 g packet Commonly known as: MIRALAX / GLYCOLAX Take 17 g by mouth daily.   rosuvastatin 40 MG  tablet Commonly known as: CRESTOR Take 1 tablet (40 mg total) by mouth daily.   senna-docusate 8.6-50 MG tablet Commonly known as: Senokot-S Take 1 tablet by mouth at bedtime.   sertraline 50 MG tablet Commonly known as: Zoloft Take 1 tablet (50 mg total) by mouth daily. Take 1/2 tablet 25 mg for 7 days, then take one whole tablet 50 mg total daily after breakfast What changed:  when to take this additional instructions   Vitamin D (Ergocalciferol) 1.25 MG (50000 UNIT) Caps capsule Commonly known as: DRISDOL Take 50,000 Units by mouth every Wednesday.        The results of significant diagnostics from this hospitalization (including imaging, microbiology, ancillary and laboratory) are listed below for reference.    Procedures and Diagnostic Studies:   ECHOCARDIOGRAM COMPLETE  Result Date: 11/24/2021    ECHOCARDIOGRAM REPORT   Patient Name:   TAMMI BOULIER Date of Exam: 11/24/2021 Medical Rec #:  [id number]          Height:       63.0 in Accession #:    [id number]         Weight:       201.3 lb Date of Birth:  [birth year]          BSA:          1.939 m Patient Age:    28 years           BP:           150/80 mmHg Patient Gender: F                  HR:           88 bpm. Exam Location:  Inpatient Procedure: 2D Echo, Cardiac Doppler, Color Doppler and Intracardiac            Opacification Agent Indications:    CHF-Acute Systolic Y17.49  History:        Patient has no prior history of Echocardiogram examinations.                 CHF, CAD, Mitral Valve Disease; Risk Factors:Sleep Apnea,                 Hypertension, Dyslipidemia and Diabetes.  Sonographer:    Greer Pickerel Referring Phys: [id number] [place]  Sonographer Comments: Suboptimal subcostal window and suboptimal apical window. Image acquisition challenging  due to patient body habitus and Image acquisition challenging due to respiratory motion. IMPRESSIONS  1. Left ventricular ejection fraction, by estimation, is 60 to 65%.  The left ventricle has normal function. The left ventricle has no regional wall motion abnormalities. Left ventricular diastolic parameters were normal.  2. Right ventricular systolic function is normal. The right ventricular size is normal.  3. The mitral valve is abnormal. Trivial mitral valve regurgitation. No evidence of mitral stenosis.  4. The aortic valve was not well visualized. There is moderate calcification of the aortic valve. There is moderate thickening of the aortic valve. Aortic valve regurgitation is not visualized. Aortic valve sclerosis/calcification is present, without any evidence of aortic stenosis.  5. The inferior vena cava is normal in size with greater than 50% respiratory variability, suggesting right atrial pressure of 3 mmHg. FINDINGS  Left Ventricle: Left ventricular ejection fraction, by estimation, is 60 to 65%. The left ventricle has normal function. The left ventricle has no regional wall motion abnormalities. Definity contrast agent was given IV to delineate the left ventricular  endocardial borders. The left ventricular internal cavity size was normal in size. There is no left ventricular hypertrophy. Left ventricular diastolic parameters were normal. Right Ventricle: The right ventricular size is normal. No increase in right ventricular wall thickness. Right ventricular systolic function is normal. Left Atrium: Left atrial size was normal in size. Right Atrium: Right atrial size was normal in size. Pericardium: There is no evidence of pericardial effusion. Mitral Valve: The mitral valve is abnormal. There is moderate thickening of the mitral valve leaflet(s). There is mild calcification of the mitral valve leaflet(s). Trivial mitral valve regurgitation. No evidence of mitral valve stenosis. Tricuspid Valve: The tricuspid valve is normal in structure. Tricuspid valve regurgitation is trivial. No evidence of tricuspid stenosis. Aortic Valve: The aortic valve was not well  visualized. There is moderate calcification of the aortic valve. There is moderate thickening of the aortic valve. Aortic valve regurgitation is not visualized. Aortic valve sclerosis/calcification is present, without any evidence of aortic stenosis. Pulmonic Valve: The pulmonic valve was normal in structure. Pulmonic valve regurgitation is trivial. No evidence of pulmonic stenosis. Aorta: The aortic root is normal in size and structure. Venous: The inferior vena cava is normal in size with greater than 50% respiratory variability, suggesting right atrial pressure of 3 mmHg. IAS/Shunts: The interatrial septum was not well visualized.  LEFT VENTRICLE PLAX 2D LVIDd:         4.90 cm   Diastology LVIDs:         3.70 cm   LV e' medial:    5.55 cm/s LV PW:         0.90 cm   LV E/e' medial:  17.1 LV IVS:        1.00 cm   LV e' lateral:   7.83 cm/s LVOT diam:     1.90 cm   LV E/e' lateral: 12.1 LV SV:         67 LV SV Index:   35 LVOT Area:     2.84 cm  RIGHT VENTRICLE RV S prime:     9.79 cm/s TAPSE (M-mode): 2.4 cm LEFT ATRIUM             Index        RIGHT ATRIUM           Index LA diam:        3.40 cm 1.75 cm/m   RA Area:  17.30 cm LA Vol (A2C):   67.8 ml 34.97 ml/m  RA Volume:   44.00 ml  22.69 ml/m LA Vol (A4C):   51.5 ml 26.56 ml/m LA Biplane Vol: 61.5 ml 31.72 ml/m  AORTIC VALVE             PULMONIC VALVE LVOT Vmax:   102.00 cm/s PR End Diast Vel: 3.34 msec LVOT Vmean:  67.400 cm/s LVOT VTI:    0.237 m  AORTA Ao Root diam: 3.20 cm Ao Asc diam:  3.40 cm MITRAL VALVE MV Area (PHT): 4.31 cm     SHUNTS MV Decel Time: 176 msec     Systemic VTI:  0.24 m MV E velocity: 94.80 cm/s   Systemic Diam: 1.90 cm MV A velocity: 134.00 cm/s MV E/A ratio:  0.71 Jenkins Rouge MD Electronically signed by Jenkins Rouge MD Signature Date/Time: 11/24/2021/1:05:14 PM    Final    MR BRAIN WO CONTRAST  Result Date: 11/24/2021 CLINICAL DATA:  Delirium EXAM: MRI HEAD WITHOUT CONTRAST TECHNIQUE: Multiplanar, multiecho pulse  sequences of the brain and surrounding structures were obtained without intravenous contrast. COMPARISON:  No prior MRI, correlation is made with CT head 10/27/2021 FINDINGS: Evaluation is limited as patient declined to complete the exam. Axial and coronal T2 and sagittal and axial T1 sequences could not be obtained. Brain: No restricted diffusion to suggest acute or subacute infarct. No acute hemorrhage, mass, mass effect, or midline shift. No hydrocephalus or extra-axial collection. No hemosiderin deposition to suggest remote hemorrhage. Scattered T2 hyperintense signal in the periventricular white matter, likely the sequela of mild chronic small vessel ischemic disease. Vascular: Limited by the absence of the aforementioned sequences. Internal carotid artery flow voids appear patent. Skull and upper cervical spine: Normal marrow signal on FLAIR sequence. No diffusion restricting lesions. Sinuses/Orbits: Clear sinuses. No acute finding in the orbits on FLAIR sequence. Other: None. IMPRESSION: Limited evaluation as patient declined complete the exam. Within this limitation, there is no acute intracranial process. No evidence of acute or subacute infarct. Electronically Signed   By: Merilyn Baba M.D.   On: 11/24/2021 00:26   DG Chest 2 View  Result Date: 11/23/2021 CLINICAL DATA:  Shortness of breath EXAM: CHEST - 2 VIEW COMPARISON:  10/27/2021 FINDINGS: Cardiomegaly. Mild, diffuse bilateral interstitial opacity. Disc degenerative disease of the thoracic spine. IMPRESSION: Cardiomegaly with mild, diffuse bilateral interstitial opacity, consistent with edema or atypical/viral infection. No focal airspace opacity. Electronically Signed   By: Delanna Ahmadi M.D.   On: 11/23/2021 17:19     Labs:   Basic Metabolic Panel: Recent Labs  Lab 11/23/21 1602 11/24/21 0105  NA 143 144  K 3.7 3.7  CL 111 113*  CO2 25 24  GLUCOSE 108* 106*  BUN 7* 6*  CREATININE 1.01* 1.02*  CALCIUM 8.8* 9.2    GFR Estimated Creatinine Clearance: 51.4 mL/min (A) (by C-G formula based on SCr of 1.02 mg/dL (H)). Liver Function Tests: Recent Labs  Lab 11/23/21 1602  AST 23  ALT 19  ALKPHOS 105  BILITOT 0.9  PROT 6.6  ALBUMIN 3.6   No results for input(s): "LIPASE", "AMYLASE" in the last 168 hours. No results for input(s): "AMMONIA" in the last 168 hours. Coagulation profile No results for input(s): "INR", "PROTIME" in the last 168 hours.  CBC: Recent Labs  Lab 11/23/21 1602  WBC 7.1  NEUTROABS 3.9  HGB 12.7  HCT 38.6  MCV 91.5  PLT 274   Cardiac Enzymes: No results for input(s): "  CKTOTAL", "CKMB", "CKMBINDEX", "TROPONINI" in the last 168 hours. BNP: Invalid input(s): "POCBNP" CBG: Recent Labs  Lab 11/25/21 0615 11/25/21 1113 11/25/21 1744 11/25/21 2047 11/26/21 0623  GLUCAP 122* 151* 85 74 92   D-Dimer No results for input(s): "DDIMER" in the last 72 hours. Hgb A1c No results for input(s): "HGBA1C" in the last 72 hours. Lipid Profile No results for input(s): "CHOL", "HDL", "LDLCALC", "TRIG", "CHOLHDL", "LDLDIRECT" in the last 72 hours. Thyroid function studies Recent Labs    11/24/21 0105  TSH 2.543   Anemia work up No results for input(s): "VITAMINB12", "FOLATE", "FERRITIN", "TIBC", "IRON", "RETICCTPCT" in the last 72 hours. Microbiology Recent Results (from the past 240 hour(s))  Respiratory (~20 pathogens) panel by PCR     Status: None   Collection Time: 11/23/21 10:35 PM   Specimen: Nasopharyngeal Swab; Respiratory  Result Value Ref Range Status   Adenovirus NOT DETECTED NOT DETECTED Final   Coronavirus 229E NOT DETECTED NOT DETECTED Final    Comment: (NOTE) The Coronavirus on the Respiratory Panel, DOES NOT test for the novel  Coronavirus (2019 nCoV)    Coronavirus HKU1 NOT DETECTED NOT DETECTED Final   Coronavirus NL63 NOT DETECTED NOT DETECTED Final   Coronavirus OC43 NOT DETECTED NOT DETECTED Final   Metapneumovirus NOT DETECTED NOT DETECTED  Final   Rhinovirus / Enterovirus NOT DETECTED NOT DETECTED Final   Influenza A NOT DETECTED NOT DETECTED Final   Influenza B NOT DETECTED NOT DETECTED Final   Parainfluenza Virus 1 NOT DETECTED NOT DETECTED Final   Parainfluenza Virus 2 NOT DETECTED NOT DETECTED Final   Parainfluenza Virus 3 NOT DETECTED NOT DETECTED Final   Parainfluenza Virus 4 NOT DETECTED NOT DETECTED Final   Respiratory Syncytial Virus NOT DETECTED NOT DETECTED Final   Bordetella pertussis NOT DETECTED NOT DETECTED Final   Bordetella Parapertussis NOT DETECTED NOT DETECTED Final   Chlamydophila pneumoniae NOT DETECTED NOT DETECTED Final   Mycoplasma pneumoniae NOT DETECTED NOT DETECTED Final    Comment: Performed at [facility], [address] [place], [place] [address]  SARS Coronavirus 2 by RT PCR (hospital order, performed in [facility] hospital lab) *cepheid single result test* Anterior Nasal Swab     Status: None   Collection Time: 11/23/21 10:35 PM   Specimen: Anterior Nasal Swab  Result Value Ref Range Status   SARS Coronavirus 2 by RT PCR NEGATIVE NEGATIVE Final    Comment: (NOTE) SARS-CoV-2 target nucleic acids are NOT DETECTED.  The SARS-CoV-2 RNA is generally detectable in upper and lower respiratory specimens during the acute phase of infection. The lowest concentration of SARS-CoV-2 viral copies this assay can detect is 250 copies / mL. A negative result does not preclude SARS-CoV-2 infection and should not be used as the sole basis for treatment or other patient management decisions.  A negative result may occur with improper specimen collection / handling, submission of specimen other than nasopharyngeal swab, presence of viral mutation(s) within the areas targeted by this assay, and inadequate number of viral copies (<250 copies / mL). A negative result must be combined with clinical observations, patient history, and epidemiological information.  Fact Sheet for Patients:    https://www.patel.info/  Fact Sheet for Healthcare Providers: https://hall.com/  This test is not yet approved or  cleared by the [place] FDA and has been authorized for detection and/or diagnosis of SARS-CoV-2 by FDA under an Emergency Use Authorization (EUA).  This EUA will remain in effect (meaning this test can be  used) for the duration of the COVID-19 declaration under Section 564(b)(1) of the Act, 21 U.S.C. section 360bbb-3(b)(1), unless the authorization is terminated or revoked sooner.  Performed at [facility], [place] [address], [place], [place] [address]    Time coordinating discharge: 35 minutes  Signed: Raiford Noble, DO  [facility] 11/26/2021, 9:51 AM

## 2021-11-26 NOTE — Progress Notes (Addendum)
Patient presents voluntary from [facility]. She appears anxious. Patient states that she feels confused about what is happening and where she is supposed to be transferred to. Patient reassured that she is in the right facility. Patient is oriented to self and time, but disoriented to place and situation. Patient denies SI, HI, and AVH at time of admission assessment. She does endorse seeing shadows of her pets at times, but not at the current moment. Patient endorses depression, but says, "I can't give you why." Patient's main stressor is "all these tests they keep giving me." Patient denies smoking, alcohol, and substance use.   Skin assessment completed with Roland Rack, RN. No contraband found. Patient skin dry and flaky. Patient also has redness on the lower abdomen area.   Patient is unsteady with ambulation and requires 1 assist with a walker. Patient educated on fall risk and to ask for assistance when getting up. Patient verbalizes understanding.   Patient has +1 edema in left and right lower extremities. She denies pain in the lower extremities. Patient does endorse 8/10 from chronic back pain. She denies wanting any PRN pain medication for the pain at this time.

## 2021-11-26 NOTE — Tx Team (Signed)
Initial Treatment Plan 11/26/2021 2:34 PM [name] [id number]    PATIENT STRESSORS: Health problems     PATIENT STRENGTHS: Financial means    PATIENT IDENTIFIED PROBLEMS: Health issues. "They are giving me all these tests."                     DISCHARGE CRITERIA:  Adequate post-discharge living arrangements  PRELIMINARY DISCHARGE PLAN: Return to previous living arrangement  PATIENT/FAMILY INVOLVEMENT: This treatment plan has been presented to and reviewed with the patient, Danielle [name], and/or family member. The patient and family have been given the opportunity to ask questions and make suggestions.  Ileene Musa, RN 11/26/2021, 2:34 PM

## 2021-11-26 NOTE — Consult Note (Signed)
Brief Psychiatry Consult Note  Saw pt, husband, and son this AM briefly to answer all questions about transfer to [facility]. Son revealed that pt spends most of her free time watching videos about dementia - wondering if some illness anxiety at play in addn to depression/pseudodementia. NC1.   Danielle [name] A Danielle [name]

## 2021-11-26 NOTE — TOC Transition Note (Signed)
Transition of Care [facility]) - CM/SW Discharge Note   Patient Details  Name: Danielle [name] MRN: [id number] Date of Birth: [birth year]  Transition of Care [facility]) CM/SW Contact:  Vinie Sill, LCSW Phone Number: 11/26/2021, 10:48 AM   Clinical Narrative:     Patient will Discharge to: [facility] Discharge Date: 11/26/2021 Family Notified: spouse Transport By: [facility]  Receiving MD: [place]  Per MD patient is ready for discharge. RN, patient, and facility notified of discharge. Discharge Summary sent to facility. RN given number for [phone].  Clinical Social Worker signing off.  Thurmond Butts, MSW, LCSW Clinical Social Worker     Final next level of care: [facility] Barriers to Discharge: Barriers Resolved   Patient Goals and CMS Choice        Discharge Placement                       Discharge Plan and Services                                     Social Determinants of Health (SDOH) Interventions Transportation Interventions: Contracted Vendor, Inpatient TOC   Readmission Risk Interventions    10/31/2021    5:06 PM  Readmission Risk Prevention Plan  Transportation Screening Complete  PCP or Specialist Appt within 3-5 Days Complete  HRI or [place] Complete  Social Work Consult for [place] Planning/Counseling Complete  Palliative Care Screening Not Applicable  Medication Review Press photographer) Complete

## 2021-11-26 NOTE — Progress Notes (Signed)
Patient transferring to [facility]. Report called to [place], [place]. All questions answered. Iv removed without complications. Waiting for transportation.  Danielle [name] [place]

## 2021-11-26 NOTE — Progress Notes (Signed)
Pt refusing to wear CPAP for the night.

## 2021-11-26 NOTE — Progress Notes (Signed)
1:1 Notes  64 - Pt in dayroom with her husband who is visiting.  2000 - Pt in bedroom in bed talking with this Probation officer.  2100 - Pt in bed with sitter at bedside. NAD. 2200 - Pt laying in bed on her side resting. NAD. Sitter at bedside.  2300 - Pt being assisted to bathroom by sitter. NAD.  0000 - Pt laying in bed. NAD. Sitter at bedside. 0100 - Pt laying in bed resting with eyes closed. NAD. Sitter at bedside. 0200 - Pt laying in bed resting with eyes closed and snoring. NAD. Sitter at bedside. 0300 - Pt laying in bed resting with eyes closed and snoring. NAD. Sitter at bedside.  0400 - Pt laying in bed resting with eyes closed and snoring. NAD. Sitter at bedside. 0500 - Pt laying in bed resting with eyes closed. NAD. Sitter at bedside. 0600 - Pt laying in bed resting with eyes closed. NAD. Sitter at bedside. 0700 - Pt laying in bed resting with eyes closed. Easy to wake up to give morning medications. NAD. Sitter at bedside.

## 2021-11-26 NOTE — Progress Notes (Addendum)
   11/26/21 2000  Psych Admission Type (Psych Patients Only)  Admission Status Voluntary  Psychosocial Assessment  Patient Complaints Anxiety;Depression;Confusion  Eye Contact Brief  Facial Expression Worried;Anxious  Affect Anxious  Speech Logical/coherent  Interaction Assertive  Motor Activity Unsteady;Slow  Appearance/Hygiene Unremarkable  Behavior Characteristics Cooperative;Anxious  Mood Anxious;Preoccupied  Thought Process  Coherency WDL  Content WDL  Delusions None reported or observed  Perception WDL  Hallucination None reported or observed  Judgment Impaired  Confusion Mild  Danger to Self  Current suicidal ideation? Denies (contracts for safety)  Danger to Others  Danger to Others None reported or observed   Progress note   D: Pt seen in her room with sitter at bedside. Pt denies SI, HI, AVH. Pt contracts for safety. Pt has seen and heard her recently deceased dog, Gracie, while she was in the [facility] but denies any hallucinations since being admitted to unit today.  Pt rates pain  0/10 right now but endorses a pain for the past 6 months that starts in her upper abdomen ("stomach") and runs across her left breast. "It lasts about a minute and then it's gone. It comes and goes." Pt says it can happen when she is reaching for something and can feel like heartburn. Pt endorses hx of acid reflux. Pt rates anxiety  9/10 and depression  8/10. Pt is anxious about all of the tests and procedures she has been through recently. "They wanted me to do a CAT scan and I thought I could do it but I just couldn't. The staff said they thought that they had what they needed from the little I did." Pt worried that she is taking too many medications. Pt endorses following UTI symptoms: burning during urination and lower abdominal cramping with urination.  Pt is A&O to self (name, birthdate), time (year is 2023). Says reason for being here is because she has been falling a lot lately and last fall  was in the tub when she was cleaning. Disoriented to place. Pt believes that her husband has changed and that her marriage is about to break up. Has some trouble finding her words when she speaks. Forgetful. Pt on 1:1 observation for poor safety awareness, memory impairment and high fall risk - needs assistance with toileting and ADLs. No other concerns noted at this time.  A: Pt provided support and encouragement. Pt given scheduled medication as prescribed. PRNs as appropriate. Q15 min checks for safety.   R: Pt safe on the unit. Sitter at bedside. Will continue to monitor.

## 2021-11-27 DIAGNOSIS — F323 Major depressive disorder, single episode, severe with psychotic features: Secondary | ICD-10-CM

## 2021-11-27 MED ORDER — HYDROCORTISONE 0.5 % EX CREA
TOPICAL_CREAM | Freq: Three times a day (TID) | CUTANEOUS | Status: DC | PRN
  Filled 2021-11-27: qty 28.35

## 2021-11-27 MED ORDER — IBUPROFEN 200 MG PO TABS
600.0000 mg | ORAL_TABLET | Freq: Four times a day (QID) | ORAL | Status: DC | PRN
  Administered 2021-11-27 – 2021-12-02 (×6): 600 mg via ORAL
  Filled 2021-11-27 (×8): qty 3

## 2021-11-27 NOTE — Progress Notes (Signed)
Pt says she does not want to wear a CPAP tonight. Will contact respiratory if pt changes her mind.

## 2021-11-27 NOTE — Progress Notes (Signed)
1:1 Note   0715 - Pt in room with sitter.  0815 - Pt in bedroom in bed talking with sitter.  0915 - Pt in bed with sitter at bedside. NAD. 1015 - Pt laying in bed on her side resting. NAD. Sitter at bedside.  1115 - Pt being assisted to bathroom by sitter. NAD.  1215 - Pt in dayroom for lunch. 1315 - Pt laying in bed resting with eyes closed. NAD. Sitter at bedside. 1415 - Pt laying in bed resting with eyes closed and snoring. NAD. Sitter at bedside. 1515 - Pt taking shower with nurses assistance.  1615 - Pt laying in bed resting with eyes closed and snoring. NAD. Sitter at bedside. 1715 - Pt laying in bed resting with eyes closed. NAD. Sitter at bedside. 49 - Pt in hall with sitter. NAD.

## 2021-11-27 NOTE — Progress Notes (Addendum)
Pt up and down in the room. Pt trying to take off her underwear. Says she isn't sure what she's doing. Pt says that the underwear are too small. Pt given mesh underwear. Pt also itching. Skin on abdomen examined.  Skin is dry and also red in areas where EKG had previously been placed on abdomen. Lotion applied to those areas. Pt c/o nausea and anxiety. Pt given PRN meds as appropriate. Pt also assisted to move up in bed with help of sitter. Bed repositioned to pt head is elevated and feet also elevated d/t edema. Pt appears to be more comfortable now. Will continue to monitor.

## 2021-11-27 NOTE — Progress Notes (Signed)
Sitter said pt experiencing chronic lower back pain 9/10 and is not comfortable in bed. Pt given PRNs as appropriate. Bed adjusted for pt. Will continue to monitor.

## 2021-11-27 NOTE — Evaluation (Signed)
Occupational Therapy Evaluation Patient Details Name: Danielle [name] MRN: [id number] DOB: Oct 03, 1945 Today's Date: 11/27/2021   History of Present Illness Patient is a 76 year old white female who was voluntarily admitted on transfer from [facility] with a diagnosis of pseudodementia.  Long history of depression and recent memory problems. Initially presented to the hospital on 11/12 with SOB, LE swelling, worsening panic attacks and recent admission prior to that with falls, lumbar compression Fx with d/c recommended for SNF at that time. PT asked to see for numerus falls recently   Clinical Impression   Ms Zucco was seen for OT/PT co-evaluation this date. Prior to hospital admission, pt was MOD I for mobility, MIN A for LB dressing. Pt lives with spouse. Pt presents to acute OT demonstrating impaired ADL performance and functional mobility 2/2 decreased activity tolerance and functional strength/ROM/balance deficits. Pt currently requires MIN A + RW for toilet t/f - cues to locate toilet and position safely prior to sitting. MOD A don/doff underwear and pants seated on toilet. MIN cues to sequence toileting. Pt would benefit from skilled OT to address noted impairments and functional limitations (see below for any additional details). Upon hospital discharge, recommend HHOT to maximize pt safety and return to PLOF.   Recommendations for follow up therapy are one component of a multi-disciplinary discharge planning process, led by the attending physician.  Recommendations may be updated based on patient status, additional functional criteria and insurance authorization.   Follow Up Recommendations  Home health OT     Assistance Recommended at Discharge Frequent or constant Supervision/Assistance  Patient can return home with the following A little help with walking and/or transfers;Assistance with cooking/housework;Direct supervision/assist for medications management;Assist for  transportation;Help with stairs or ramp for entrance;A little help with bathing/dressing/bathroom    Functional Status Assessment  Patient has had a recent decline in their functional status and demonstrates the ability to make significant improvements in function in a reasonable and predictable amount of time.  Equipment Recommendations  BSC/3in1    Recommendations for Other Services       Precautions / Restrictions Precautions Precautions: Fall Restrictions Weight Bearing Restrictions: No      Mobility Bed Mobility Overal bed mobility: Needs Assistance Bed Mobility: Supine to Sit, Sit to Supine     Supine to sit: Min assist Sit to supine: Mod assist   General bed mobility comments: performed x 2 bouts. encouraged and educated patient on the logroll technique which patient was unable to recall when performing bed mobility the second time. assistance required mostly for LE support to return to bed    Transfers Overall transfer level: Needs assistance Equipment used: Rolling walker (2 wheels) Transfers: Sit to/from Stand Sit to Stand: Min assist, Min guard           General transfer comment: several standing bouts performed from chair with no arms, from the bed x 3 bouts, from toilet x 3 bouts. patient needs occasional lifting assistance for standing, otherwise Min guard for safety      Balance Overall balance assessment: Needs assistance Sitting-balance support: Feet supported Sitting balance-Leahy Scale: Fair     Standing balance support: No upper extremity supported, During functional activity Standing balance-Leahy Scale: Fair                             ADL either performed or assessed with clinical judgement   ADL Overall ADL's : Needs assistance/impaired  General ADL Comments: MIN A + RW for toilet t/f - cues to locate toilet and position safely prior to sitting. MOD A don/doff underwear  and pants seated on toilet. MIN cues to sequence toileting.      Pertinent Vitals/Pain Pain Assessment Pain Assessment: Faces Faces Pain Scale: Hurts a little bit Pain Location: abdomen and back Pain Descriptors / Indicators: Discomfort Pain Intervention(s): Limited activity within patient's tolerance, Repositioned     Hand Dominance     Extremity/Trunk Assessment Upper Extremity Assessment Upper Extremity Assessment: Generalized weakness   Lower Extremity Assessment Lower Extremity Assessment: Generalized weakness       Communication Communication Communication: Expressive difficulties   Cognition Arousal/Alertness: Awake/alert Behavior During Therapy: Flat affect, Anxious Overall Cognitive Status: History of cognitive impairments - at baseline                                 General Comments: Orientation log: 15/30. Sequencing cueing for all ADLs      Home Living Family/patient expects to be discharged to:: Private residence Living Arrangements: Spouse/significant other Available Help at Discharge: Family Type of Home: House Home Access: Stairs to enter Technical brewer of Steps: 2   Home Layout: Two level;1/2 bath on main level Alternate Level Stairs-Number of Steps: flight- has chair lift   Bathroom Shower/Tub: Occupational psychologist: Standard     Home Equipment: Conservation officer, nature (2 wheels);Shower seat;Shower seat - built in;Cane - single point;Adaptive equipment Adaptive Equipment: Reacher Additional Comments: information is from past hospital visits. patient unable to provide this information today      Prior Functioning/Environment Prior Level of Function : Needs assist             Mobility Comments: pt reports no AD then states uses walking stick ADLs Comments: pt spouse helping with LB dressing i.e. shoes, socks, pulling up pants        OT Problem List: Decreased strength;Decreased activity tolerance;Impaired  balance (sitting and/or standing);Decreased safety awareness;Decreased knowledge of use of DME or AE;Decreased knowledge of precautions;Decreased cognition;Decreased range of motion      OT Treatment/Interventions: Self-care/ADL training;Therapeutic exercise;DME and/or AE instruction;Therapeutic activities;Balance training;Patient/family education;Cognitive remediation/compensation;Energy conservation    OT Goals(Current goals can be found in the care plan section) Acute Rehab OT Goals Patient Stated Goal: to go home OT Goal Formulation: With patient Time For Goal Achievement: 12/11/21 Potential to Achieve Goals: Good ADL Goals Pt Will Perform Grooming: with modified independence;standing Pt Will Perform Lower Body Dressing: with modified independence;sit to/from stand Pt Will Transfer to Toilet: with modified independence;regular height toilet;ambulating  OT Frequency: Min 2X/week    Co-evaluation PT/OT/SLP Co-Evaluation/Treatment: Yes Reason for Co-Treatment: Necessary to address cognition/behavior during functional activity;For patient/therapist safety PT goals addressed during session: Mobility/safety with mobility OT goals addressed during session: ADL's and self-care      AM-PAC OT "6 Clicks" Daily Activity     Outcome Measure Help from another person eating meals?: None Help from another person taking care of personal grooming?: A Little Help from another person toileting, which includes using toliet, bedpan, or urinal?: A Little Help from another person bathing (including washing, rinsing, drying)?: A Lot Help from another person to put on and taking off regular upper body clothing?: A Little Help from another person to put on and taking off regular lower body clothing?: A Lot 6 Click Score: 17   End of Session Nurse Communication: Mobility status  Activity Tolerance: Patient tolerated treatment well Patient left: in bed;with call bell/phone within reach;with  nursing/sitter in room  OT Visit Diagnosis: Unsteadiness on feet (R26.81);Muscle weakness (generalized) (M62.81)                Time: 2763-9432 OT Time Calculation (min): 38 min Charges:  OT General Charges $OT Visit: 1 Visit OT Evaluation $OT Eval Moderate Complexity: 1 Mod OT Treatments $Self Care/Home Management : 8-22 mins  Dessie Coma, M.S. OTR/L  11/27/21, 3:49 PM  ascom [phone]

## 2021-11-27 NOTE — Progress Notes (Signed)
Pt c/o nausea and dizziness. BP laying 101/53 (79) and BP sitting is 109/33 (64). Pt given Gatorade to drink and PRN medication as appropriate. Will reassess BP.

## 2021-11-27 NOTE — BHH Counselor (Signed)
Adult Comprehensive Assessment  Patient ID: Danielle [name], female   DOB: 10-Sep-1945, 76 y.o.   MRN: [id number]  Information Source: Information source: Patient  Current Stressors:  Patient states their primary concerns and needs for treatment are:: "I was at my sons home and his wife.  I slipped and fell down his stairs, twice.  It put me in a limited ability to do things.  Patient also reports anxiety and depession" Patient states their goals for this hospitilization and ongoing recovery are:: "Get well.  Be more active. Spend more ime with my daughters." Educational / Learning stressors: Pt denies. Employment / Job issues: Pt denies. Family Relationships: "nothing seriousPublishing copy / Lack of resources (include bankruptcy): Pt denies. Housing / Lack of housing: Pt denies. Physical health (include injuries & life threatening diseases): "my weight" Social relationships: Pt denies. Substance abuse: Pt denies. Bereavement / Loss: Pt denies.  Living/Environment/Situation:  Living Arrangements: Spouse/significant other Who else lives in the home?: "my husband for the most part" How long has patient lived in current situation?: "8 years" What is atmosphere in current home: Comfortable, Quarry manager  Family History:  Marital status: Married Number of Years Married: 7 Does patient have children?: Yes How many children?: 4 How is patient's relationship with their children?: Pt reports that one of the children has passed away.  "great"  Childhood History:  By whom was/is the patient raised?: Mother, Other (Comment) ("my aunt") Description of patient's relationship with caregiver when they were a child: "great" Patient's description of current relationship with people who raised him/her: Pt reports that they are deceased. How were you disciplined when you got in trouble as a child/adolescent?: "I don't know how to answer that" Does patient have siblings?: Yes Number of Siblings:  3 Description of patient's current relationship with siblings: "Average" Did patient suffer any verbal/emotional/physical/sexual abuse as a child?: No Did patient suffer from severe childhood neglect?: No Has patient ever been sexually abused/assaulted/raped as an adolescent or adult?: No Was the patient ever a victim of a crime or a disaster?: No Witnessed domestic violence?: Yes Has patient been affected by domestic violence as an adult?: No Description of domestic violence: Pt declined to provide details.  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 If attempted suicide, did drugs/alcohol play a role in this?: No Alcohol/Substance Abuse Treatment Hx: Denies past history Has alcohol/substance abuse ever caused legal problems?: No  Social Support System:   Patient's Community Support System: Good Describe Community Support System: "my former sister in law" Type of faith/religion: "just started going back to church recently" How does patient's faith help to cope with current illness?: "I pray"  Leisure/Recreation:   Do You Have Hobbies?: No  Strengths/Needs:   What is the patient's perception of their strengths?: "I try to help other people when I can" Patient states they can use these personal strengths during their treatment to contribute to their recovery: Pt denies. Patient states these barriers may  affect/interfere  with their treatment: Pt denies. Patient states these barriers may affect their return to the community: Pt denies.  Discharge Plan:   Currently receiving community mental health services: No Patient states concerns and preferences for aftercare planning are: Patient reports that she does not wish to have an outpatient provider. Patient states they will know when they are safe and ready for discharge when: "because I'll have help at home helping me get organized" Does patient have access to transportation?: Yes Does patient have financial barriers related to discharge medications?: No Will patient be returning to same living situation after discharge?: Yes  Summary/Recommendations:   Summary and Recommendations (to be completed by the evaluator): Patient is a 76 year old married female from [place].  Patient presents to the hospital for concerns for anxiety and depression.  In the past the patient was recommended for a SNF by PT/OT, however, family indicated that they would like for the patient to return home with home health.  This will need to be examined at this admission.  Patient presented to the hospital for "panic attacks, depression, insomnia, and delirium".  At initial admission assessment patient struggled with engaging in moderately challenging cognitive tasks.  There is some concern for memory issues.  The primary concern for her husband is that patient is having a hard time completing tasks of daily living like dressing herself.  However, examination oof her chart indicate that patient does not have dementia and recommends treating her anxiety going forward.  At this time patient is declining any aftercare providers.  Recommendations include: crisis stabilization, therapeutic milieu, encourage group attendance and participation, medication management for mood stabilization and development of comprehensive mental wellness/sobriety plan.  Danielle [name].  11/27/2021

## 2021-11-27 NOTE — [facility] Suicide Risk Assessment (Signed)
[facility] Admission Suicide Risk Assessment   Nursing information obtained from:  Patient, Review of record Demographic factors:  Age 76 or older Current Mental Status:  NA Loss Factors:  Decline in physical health Historical Factors:  NA Risk Reduction Factors:  Positive social support, Living with another person, especially a relative  Total Time spent with patient: 1 hour Principal Problem: Major depressive disorder, single episode, severe with psychotic features ([place]) Diagnosis:  Principal Problem:   Major depressive disorder, single episode, severe with psychotic features ([place])  Subjective Data:  Danielle [name] is a 76 year old white female who was voluntarily admitted on transfer from [facility] with a diagnosis of pseudodementia.  Long history of depression and recent memory problems.  She states that she was cleaning the bathroom and fell and hit her head she presented to the emergency room where she was worked up and discharged.  Apparently, she was at her son's house and was hearing voices and the next thing she knew she was on the ground.  She is a poor historian and this is coming from her.  She states that she has been depressed most of her life but denies any suicidal ideation.  She continues to hear voices and sees shadows.  She denies any auditory hallucinations at this time.  She had an extensive medical workup recently which is noted below.  CT of her head showed possible ischemic small vessel disease.  MRI was negative for the most part but was not completed.  She does have a normal ejection fraction.  She is currently seeing Lesle Chris and was seeing [name] at [facility].  I asked if she had ever had ECT and she said no.  She has been on numerous medications in the past.  Since admission she apparently has been more cognitive and alert.  She answers questions appropriately and knows where she is at.  She does fall asleep but she just received Ativan.   Continued Clinical  Symptoms:  Alcohol Use Disorder Identification Test Final Score (AUDIT): 0 The "Alcohol Use Disorders Identification Test", Guidelines for Use in Primary Care, Second Edition.  World Pharmacologist [facility]). Score between 0-7:  no or low risk or alcohol related problems. Score between 8-15:  moderate risk of alcohol related problems. Score between 16-19:  high risk of alcohol related problems. Score 20 or above:  warrants further diagnostic evaluation for alcohol dependence and treatment.   CLINICAL FACTORS:   Depression:   Severe   Musculoskeletal: Strength & Muscle Tone: within normal limits Gait & Station: unsteady Patient leans: N/A  Psychiatric Specialty Exam:  Presentation  General Appearance:  Appropriate for Environment; Casual; Fairly Groomed  Eye Contact: Good  Speech: Clear and Coherent; Normal Rate  Speech Volume: Normal  Handedness: Right   Mood and Affect  Mood: Anxious  Affect: Appropriate; Congruent   Thought Process  Thought Processes: Coherent  Descriptions of Associations:Intact  Orientation:Full (Time, Place and Person)  Thought Content:Logical; Rumination  History of Schizophrenia/Schizoaffective disorder:No  Duration of Psychotic Symptoms:N/A  Hallucinations:No data recorded Ideas of Reference:None  Suicidal Thoughts:No data recorded Homicidal Thoughts:No data recorded  Sensorium  Memory: Immediate Fair; Recent Fair; Remote Fair  Judgment: Fair  Insight: Fair   Community education officer  Concentration: Good  Attention Span: Good  Recall: [place] of Knowledge: Fair  Language: Good   Psychomotor Activity  Psychomotor Activity:No data recorded  Assets  Assets: Communication Skills; Desire for Improvement; Physical Health; Resilience   Sleep  Sleep:No data  recorded    Blood pressure (!) 88/77, pulse 90, temperature 98.8 F (37.1 C), temperature source Oral, resp. rate 18, height '5\' 3"'$  (1.6 m),  weight 86.2 kg, SpO2 97 %. Body mass index is 33.66 kg/m.   COGNITIVE FEATURES THAT CONTRIBUTE TO RISK:  Loss of executive function    SUICIDE RISK:   Minimal: No identifiable suicidal ideation.  Patients presenting with no risk factors but with morbid ruminations; may be classified as minimal risk based on the severity of the depressive symptoms  PLAN OF CARE: See orders  I certify that inpatient services furnished can reasonably be expected to improve the patient's condition.   Parks Ranger, DO 11/27/2021, 10:49 AM

## 2021-11-27 NOTE — Progress Notes (Signed)
1:1 Notes  1900 - Pt sitting in chair by the phone. NAD. Sitter at side.  2000 - Pt laying in bed resting. C/O HA and chronic back pain. Will contact provider. Sitter at bedside. 2100 - Pt in bed resting with eyes closed. Says that pain went away but nausea still present a little bit. BP is 118/40 (58). Provider notified. Sitter at bedside. 2200 - Pt in bathroom with sitter. Pt c/o nausea and headache. Pt VS assessed and are stable. Pt given cold ginger ale to drink since already given Zofran. Pt still c/o itching and lotion applied. Pt assisted to comfortable position in bed.  2300 - Pt in bed resting with eyes closed. NAD. Sitter at bedside.  2354 - Pt sitting on side of bed, c/o back pain 9/10. Pt looks sleepy but is uncomfortable in bed. Given PRN pain medication. Sitter at bedside.  0100 - Pt in bed resting with eyes closed. NAD. Sitter at bedside. 0200 - Pt in bed resting with eyes closed and snoring. NAD. Sitter at bedside. 0300 - Pt in bed resting with eyes closed and snoring. NAD. Sitter at bedside. 0400 - Pt in bed resting with eyes closed. NAD. Sitter at bedside. 0500 - Pt in bed resting with eyes closed. NAD. Sitter at bedside. 0600 - Pt in bed resting with eyes closed. NAD. Sitter at bedside. 0700 - Pt being helped to the bathroom by sitter. NAD.

## 2021-11-27 NOTE — Progress Notes (Signed)
Patient is alert and oriented times 1. She is extremely confused. Mood and affect sad/sullen. Patient denies pain. She denies SI, HI, and AVH. Also denies feelings of anxiety and depression at this time. States she slept good last night. Morning meds given whole by mouth W/O difficulty. Ate breakfast in day room- appetite good. Patient remains on unit with Q15 minute checks in place.

## 2021-11-27 NOTE — Evaluation (Addendum)
Physical Therapy Evaluation Patient Details Name: Danielle [name] MRN: [id number] DOB: 01/30/1945 Today's Date: 11/27/2021  History of Present Illness  Patient is a 76 year old white female who was voluntarily admitted on transfer from [facility] with a diagnosis of pseudodementia.  Long history of depression and recent memory problems. Initially presented to the hospital on 11/12 with SOB, LE swelling, worsening panic attacks and recent admission prior to that with falls, lumbar compression Fx with d/c recommended for SNF at that time. PT asked to see for numerus falls recently   Clinical Impression  Patient was agreeable to PT evaluation. She is confused and reports feeling sleepy. She was disoriented to situation, time and day of the week. She has slow processing and needs maximal multi-modal cues to complete most functional mobility tasks. Increased time and effort required with all mobility efforts. She currently requires assistance at times for standing. She can walk with the rolling walker but needs cues for positioning rolling walker close to base of support for safety. She reports multiple recent falls at home. Recommend to continue PT while in the hospital to maximize independence and facilitate return to prior level of function. Anticipate patient can return home with family support and HHPT.      Recommendations for follow up therapy are one component of a multi-disciplinary discharge planning process, led by the attending physician.  Recommendations may be updated based on patient status, additional functional criteria and insurance authorization.  Follow Up Recommendations Home health PT      Assistance Recommended at Discharge Intermittent Supervision/Assistance  Patient can return home with the following  A little help with walking and/or transfers;A little help with bathing/dressing/bathroom;Assistance with cooking/housework;Direct supervision/assist for medications  management;Assist for transportation;Help with stairs or ramp for entrance    Equipment Recommendations None recommended by PT  Recommendations for Other Services       Functional Status Assessment Patient has had a recent decline in their functional status and demonstrates the ability to make significant improvements in function in a reasonable and predictable amount of time.     Precautions / Restrictions Precautions Precautions: Fall Restrictions Weight Bearing Restrictions: No      Mobility  Bed Mobility Overal bed mobility: Needs Assistance Bed Mobility: Supine to Sit, Sit to Supine     Supine to sit: Min assist Sit to supine: Mod assist   General bed mobility comments: performed x 2 bouts. encouraged and educated patient on the logroll technique which patient was unable to recall when performing bed mobility the second time. assistance required mostly for LE support to return to bed    Transfers Overall transfer level: Needs assistance Equipment used: Rolling walker (2 wheels) Transfers: Sit to/from Stand Sit to Stand: Min assist, Min guard           General transfer comment: several standing bouts performed from chair with no arms, from the bed x 3 bouts, from toilet x 3 bouts. patient needs occasional lifting assistance for standing, otherwise Min guard for safety    Ambulation/Gait Ambulation/Gait assistance: Min guard, Min assist Gait Distance (Feet): 130 Feet Assistive device: Rolling walker (2 wheels) Gait Pattern/deviations: Trunk flexed, Decreased stride length Gait velocity: decreased     General Gait Details: patient required cues for positioning of rolling walker closer to base of support with steadying assistance provided especially with turns, otherwise Min guard for safety.  Stairs            Wheelchair Mobility    Modified Rankin (Stroke  Patients Only)       Balance Overall balance assessment: Needs assistance Sitting-balance  support: Feet supported Sitting balance-Leahy Scale: Fair     Standing balance support: Bilateral upper extremity supported Standing balance-Leahy Scale: Fair                               Pertinent Vitals/Pain Pain Assessment Pain Assessment: Faces Faces Pain Scale: Hurts a little bit Pain Location: abdomen and back Pain Descriptors / Indicators: Discomfort Pain Intervention(s): Limited activity within patient's tolerance, Monitored during session, Repositioned    Home Living Family/patient expects to be discharged to:: Private residence Living Arrangements: Spouse/significant other Available Help at Discharge: Family Type of Home: House Home Access: Stairs to enter   Technical brewer of Steps: 2 Alternate Level Stairs-Number of Steps: flight- has chair lift Home Layout: Two level;1/2 bath on main level Home Equipment: Conservation officer, nature (2 wheels);Shower seat;Shower seat - built in;Cane - single point;Adaptive equipment Additional Comments: information is from past hospital visits. patient unable to provide this information today    Prior Function Prior Level of Function : Needs assist             Mobility Comments: pt reports no AD then states uses walking stick ADLs Comments: pt spouse helping with LB dressing i.e. shoes, socks, pulling up pants     Hand Dominance        Extremity/Trunk Assessment   Upper Extremity Assessment Upper Extremity Assessment: Generalized weakness    Lower Extremity Assessment Lower Extremity Assessment: Generalized weakness       Communication   Communication: Expressive difficulties  Cognition Arousal/Alertness: Awake/alert Behavior During Therapy: Flat affect, Anxious Overall Cognitive Status: History of cognitive impairments - at baseline                                 General Comments: very slow processing with multi-modal cues required with all activity. cues for task initiation, cues for  sequencing. oriented to person, hospital, date. not oriented time of day/day of the week, not oriented to situation        General Comments      Exercises     Assessment/Plan    PT Assessment Patient needs continued PT services  PT Problem List Decreased strength;Decreased range of motion;Decreased activity tolerance;Decreased balance;Decreased mobility;Decreased cognition;Decreased safety awareness;Decreased knowledge of precautions       PT Treatment Interventions DME instruction;Gait training;Stair training;Functional mobility training;Therapeutic activities;Therapeutic exercise;Neuromuscular re-education;Balance training;Cognitive remediation;Patient/family education    PT Goals (Current goals can be found in the Care Plan section)  Acute Rehab PT Goals Patient Stated Goal: to go home PT Goal Formulation: With patient Time For Goal Achievement: 12/11/21 Potential to Achieve Goals: Good    Frequency Min 2X/week     Co-evaluation PT/OT/SLP Co-Evaluation/Treatment: Yes Reason for Co-Treatment: Necessary to address cognition/behavior during functional activity;For patient/therapist safety PT goals addressed during session: Mobility/safety with mobility OT goals addressed during session: ADL's and self-care       AM-PAC PT "6 Clicks" Mobility  Outcome Measure Help needed turning from your back to your side while in a flat bed without using bedrails?: None Help needed moving from lying on your back to sitting on the side of a flat bed without using bedrails?: A Lot Help needed moving to and from a bed to a chair (including a wheelchair)?: A Little Help needed standing  up from a chair using your arms (e.g., wheelchair or bedside chair)?: A Little Help needed to walk in hospital room?: A Little Help needed climbing 3-5 steps with a railing? : A Little 6 Click Score: 18    End of Session   Activity Tolerance: Patient limited by fatigue Patient left: in bed;with call  bell/phone within reach Nurse Communication: Mobility status PT Visit Diagnosis: Unsteadiness on feet (R26.81);Muscle weakness (generalized) (M62.81)    Time: 0315-9458 PT Time Calculation (min) (ACUTE ONLY): 33 min   Charges:   PT Evaluation $PT Eval Low Complexity: 1 Low PT Treatments $Gait Training: 8-22 mins        Minna Merritts, PT, MPT  Percell Locus 11/27/2021, 3:41 PM

## 2021-11-27 NOTE — Progress Notes (Signed)
   11/27/21 1945  Psych Admission Type (Psych Patients Only)  Admission Status Voluntary  Psychosocial Assessment  Patient Complaints Confusion;Anxiety;Depression  Eye Contact Fair  Facial Expression Anxious;Worried  Affect Anxious  Best boy Cooperative;Anxious  Mood Anxious;Preoccupied  Thought Process  Coherency WDL  Content WDL  Delusions None reported or observed  Perception WDL  Hallucination None reported or observed  Judgment Impaired  Confusion Mild  Danger to Self  Current suicidal ideation? Denies  Danger to Others  Danger to Others None reported or observed   Progress note   D: Pt seen in room with sitter. Pt denies SI, HI, AVH. Pt rates pain  9/10 as chronic pain in back and an acute frontal headache. Pt rates anxiety  10/10 and depression  10/10. "I'm still waiting for my husband to come today. He said he was going to come." Pt said she feels terrible that she has not seen him today. Pt c/o nausea and dizziness. VS checked and diastolic BP low. Pt given fluids. Pt states still feeling some pain with urination. Edema in BLE - worse on left than right. Pt still having problems finding her words when asked questions. Some confusion. No other concerns noted at this time.  A: Pt provided support and encouragement. Pt given scheduled medication as prescribed. PRNs as appropriate. Q15 min checks for safety.   R: Pt safe on the unit. Will continue to monitor.

## 2021-11-27 NOTE — H&P (Signed)
Psychiatric Admission Assessment Adult  Patient Identification: Danielle [name] MRN:  [id number] Date of Evaluation:  11/27/2021 Chief Complaint:  Major depressive disorder, single episode, severe with psychotic features ([place]) [F32.3] Principal Diagnosis: Major depressive disorder, single episode, severe with psychotic features ([place]) Diagnosis:  Principal Problem:   Major depressive disorder, single episode, severe with psychotic features ([place])  History of Present Illness: Danielle [name] is a 76 year old white female who was voluntarily admitted on transfer from [facility] with a diagnosis of pseudodementia.  Long history of depression and recent memory problems.  She states that she was cleaning the bathroom and fell and hit her head she presented to the emergency room where she was worked up and discharged.  Apparently, she was at her son's house and was hearing voices and the next thing she knew she was on the ground.  She is a poor historian and this is coming from her.  She states that she has been depressed most of her life but denies any suicidal ideation.  She continues to hear voices and sees shadows.  She denies any auditory hallucinations at this time.  She had an extensive medical workup recently which is noted below.  CT of her head showed possible ischemic small vessel disease.  MRI was negative for the most part but was not completed.  She does have a normal ejection fraction.  She is currently seeing Lesle Chris and was seeing [name] at [facility].  I asked if she had ever had ECT and she said no.  She has been on numerous medications in the past.  Since admission she apparently has been more cognitive and alert.  She answers questions appropriately and knows where she is at.  She does fall asleep but she just received Ativan.  PER DISCHARGE SUMMARY: Danielle [name] is a 76 y.o. female with PMH significant for DM2, HTN, HLD, CAD, cognitive decline/memory loss,  depression/anxiety, hypothyroidism, OSA on CPAP who was recently hospitalized 10/16-10/24 for frequent falls, lumbar compression fracture, AKI, UTI.  Hospital course was also complicated by delirium/hallucinations. PT OT had recommended SNF but family declined and she was discharged with home health.   11/12, patient presented to the ED with complaint of shortness of breath, leg swelling, worsening anxiety/panic attacks/depression, delirium/hallucinations, insomnia, poor appetite.  Family is having difficulty taking care of her at home. In the ED, vital signs stable.  Labs showing BNP 166.   UA with negative nitrite, small amount of leukocytes, and microscopy showing 6-10 WBCs and rare bacteria.   Chest x-ray showed cardiomegaly with mild diffuse bilateral interstitial opacity consistent with edema or atypical/viral infection; no focal airspace opacity.  Patient was given fentanyl and IV Lasix 40 mg.   Admitted to [facility] On chart review it was noted that patient was having memory issues for last several months.  She was seen by neurology as an outpatient and was planned for an MRI brain which has not been done yet.  Patient lately has been having a lot of panic attacks. For the past few days she noticed that both of her legs are swollen.  She feels winded just walking to her mailbox at home.     In the ED, patient was afebrile, heart rate in 70s and 80s, blood pressure in 140s 150s, breathing on room air. Labs from this morning with negative procalcitonin.  Stable renal function.  Normal TSH. Echo with EF 60 to 65%.      Associated Signs/Symptoms: Depression Symptoms:  depressed  mood, anhedonia, insomnia, anxiety, Duration of Depression Symptoms: Greater than two weeks  (Hypo) Manic Symptoms:  Hallucinations, Anxiety Symptoms:  Excessive Worry, Panic Symptoms, Psychotic Symptoms:  Hallucinations: Auditory Visual PTSD Symptoms: NA Total Time spent with patient: 1 hour  Past Psychiatric  History: Quite extensive but I do not see any admissions even though she said that she has been admitted numerous times in the past mostly at [facility].  Is the patient at risk to self? Yes.    Has the patient been a risk to self in the past 6 months? Yes.    Has the patient been a risk to self within the distant past? Yes.    Is the patient a risk to others? No.  Has the patient been a risk to others in the past 6 months? No.  Has the patient been a risk to others within the distant past? No.   [place] Scale:  [place] Admission (Current) from 11/26/2021 in [place] ED to Hosp-Admission (Discharged) from 11/23/2021 in [facility] 4E CV [place] ED to Hosp-Admission (Discharged) from 10/27/2021 in [place]  C-SSRS RISK CATEGORY No Risk No Risk No Risk        Prior Inpatient Therapy:   Prior Outpatient Therapy:    Alcohol Screening: 1. How often do you have a drink containing alcohol?: Never 2. How many drinks containing alcohol do you have on a typical day when you are drinking?: 1 or 2 3. How often do you have six or more drinks on one occasion?: Never AUDIT-C Score: 0 4. How often during the last year have you found that you were not able to stop drinking once you had started?: Never 5. How often during the last year have you failed to do what was normally expected from you because of drinking?: Never 6. How often during the last year have you needed a first drink in the morning to get yourself going after a heavy drinking session?: Never 7. How often during the last year have you had a feeling of guilt of remorse after drinking?: Never 8. How often during the last year have you been unable to remember what happened the night before because you had been drinking?: Never 9. Have you or someone else been injured as a result of your drinking?: No 10. Has a relative or friend or a doctor or another health  worker been concerned about your drinking or suggested you cut down?: No Alcohol Use Disorder Identification Test Final Score (AUDIT): 0 Alcohol Brief Interventions/Follow-up: Patient Refused Substance Abuse History in the last 12 months:  No. Consequences of Substance Abuse: NA Previous Psychotropic Medications: Yes  Psychological Evaluations: Yes  Past Medical History:  Past Medical History:  Diagnosis Date   Arthritis 02/13/2011   osteoarthritis, hips, knees,s/p Cervical fusion(DDD)   Atherosclerosis of aorta ([place]) 05/30/2015   on CTA chest   CAD (coronary artery disease)    a. 09/2016: Coronary CT showing mid-LAD plaque with 20-50% associated stenosis but no significant stenosis by FFR analysis.    Dementia ([place])    Depression with anxiety    Diabetes mellitus without complication ([place])    Difficult intubation 02/13/2011   with surgery -multiple times, no problems with recent surgeries   GERD (gastroesophageal reflux disease) 02/13/2011   tx. Omeprazole   Glaucoma 02/13/2011   bil. tx. eye drops daily   Headache(784.0) 02/13/2011   past hx. migraines, none recent  Heart murmur 02/13/2011   was told-no issues with this   Hypercholesterolemia    Hypertension 02/13/2011   Hypothyroidism 02/13/2011   tx. with Levothyroxine   Major depressive disorder    Mitral regurgitation 06/01/2015   Mild   Sinus drainage 02/13/2011   uses Zyrtec as needed   Sleep apnea 02/13/2011   Hx. sleep apnea-uses cpap since 10'12 nightly    Past Surgical History:  Procedure Laterality Date   ABDOMINAL HYSTERECTOMY  02/13/2011   APPENDECTOMY  02/13/2011   Lap. removal '92   BREAST CYST EXCISION Left    CERVICAL FUSION  02/13/2011   '92- retained hardware   CHOLECYSTECTOMY  02/13/2011   '98-lap. galbladder removal due to stones   GUM SURGERY  02/13/2011   "small mouth"   JOINT REPLACEMENT  02/13/2011   RTHA-a yr ago in [place] Left 08/15/2019   Procedure: EXCISION OF  SEBACEOUS CYST LEFT BREAST;  Surgeon: Georganna Skeans, MD;  Location: [facility];  Service: General;  Laterality: Left;   TONSILLECTOMY  02/13/2011   child   TOTAL HIP ARTHROPLASTY  02/17/2011   Procedure: TOTAL HIP ARTHROPLASTY ANTERIOR APPROACH;  Surgeon: Mauri Pole, MD;  Location: [facility];  Service: Orthopedics;  Laterality: Left;   TUBAL LIGATION  02/13/2011   Family History:  Family History  Problem Relation Age of Onset   Cancer Mother        lung   Breast cancer Maternal Aunt    Alzheimer's disease Neg Hx    Dementia Neg Hx    Family Psychiatric  History: Unremarkable Tobacco Screening:   Social History:  Social History   Substance and Sexual Activity  Alcohol Use Yes   Comment: rare     Social History   Substance and Sexual Activity  Drug Use No    Additional Social History:  OUT-PATIENT VISIT BY BRIAN WHITE, NP (10-09-21)   "[name]", 76 year old female presents to this office for initial visit and to establish care.  Her husband Ed and her son is present with her verbal consent. Not as much crying as last visit but still distressed. Recall is impaired and she cannot remember medications or recent events. Husband says she had recent CT and the results were nothing of significance . Son says that she continues to have visual hallucinations. She sees her dead pet in the kitchen and with one episode reported seeing pools of blood all over the kitchen. She appears to be mentally foggy with some expressive aphasia when asked direct questions.  States that her anxiety today is 6/10, and her depression is 6/10.  States that her sleep is broken and often wakes up several times per night.  She has not ben wearing her C-pap consistently.  She is very concerned about her memory but has been consulting with neurology.  States that neurology determined that she did not have Alzheimer's or progressing dementia.  Says also says that she sometimes sees images of people that are  not present.  She denies auditory or command hallucinations.  Denies delirium.  Recent ER visit on 9/13 concluded UTI but was treated with AB. She has completed AB regimen.  Patient states that they will request records from [facility] be forwarded to this practice.                       Allergies:   Allergies  Allergen Reactions   Ceftin [Cefuroxime Axetil] Hives, Swelling and Other (See  Comments)    Swelling of face   Codeine Nausea Only   Compazine [Prochlorperazine] Swelling and Other (See Comments)    Face and tongue swelling   Lab Results:  Results for orders placed or performed during the hospital encounter of 11/23/21 (from the past 48 hour(s))  Glucose, capillary     Status: Abnormal   Collection Time: 11/25/21 11:13 AM  Result Value Ref Range   Glucose-Capillary 151 (H) 70 - 99 mg/dL    Comment: Glucose reference range applies only to samples taken after fasting for at least 8 hours.  Glucose, capillary     Status: None   Collection Time: 11/25/21  5:44 PM  Result Value Ref Range   Glucose-Capillary 85 70 - 99 mg/dL    Comment: Glucose reference range applies only to samples taken after fasting for at least 8 hours.  Glucose, capillary     Status: None   Collection Time: 11/25/21  8:47 PM  Result Value Ref Range   Glucose-Capillary 74 70 - 99 mg/dL    Comment: Glucose reference range applies only to samples taken after fasting for at least 8 hours.  Glucose, capillary     Status: None   Collection Time: 11/26/21  6:23 AM  Result Value Ref Range   Glucose-Capillary 92 70 - 99 mg/dL    Comment: Glucose reference range applies only to samples taken after fasting for at least 8 hours.  Glucose, capillary     Status: Abnormal   Collection Time: 11/26/21 11:13 AM  Result Value Ref Range   Glucose-Capillary 106 (H) 70 - 99 mg/dL    Comment: Glucose reference range applies only to samples taken after fasting for at least 8 hours.    Blood Alcohol level:  No results  found for: "ETH"  Metabolic Disorder Labs:  Lab Results  Component Value Date   HGBA1C 5.7 (H) 10/28/2021   MPG 116.89 10/28/2021   MPG 114.02 10/08/2016   No results found for: "PROLACTIN" Lab Results  Component Value Date   CHOL 170 10/08/2016   TRIG 105 10/08/2016   HDL 63 10/08/2016   CHOLHDL 2.7 10/08/2016   VLDL 21 10/08/2016   LDLCALC 86 10/08/2016   LDLCALC 76 05/31/2015    Current Medications: Current Facility-Administered Medications  Medication Dose Route Frequency Provider Last Rate Last Admin   acetaminophen (TYLENOL) tablet 650 mg  650 mg Oral Q6H PRN Parks Ranger, DO       alum & mag hydroxide-simeth (MAALOX/MYLANTA) 200-200-20 MG/5ML suspension 30 mL  30 mL Oral Q4H PRN Parks Ranger, DO       FLUoxetine (PROZAC) capsule 20 mg  20 mg Oral Daily Parks Ranger, DO   20 mg at 11/27/21 0910   fluticasone (FLONASE) 50 MCG/ACT nasal spray 2 spray  2 spray Each Nare Daily Parks Ranger, DO   2 spray at 11/27/21 0905   furosemide (LASIX) tablet 20 mg  20 mg Oral Daily Parks Ranger, DO   20 mg at 11/27/21 0910   levothyroxine (SYNTHROID) tablet 150 mcg  150 mcg Oral Q0600 Parks Ranger, DO   150 mcg at 11/27/21 0263   LORazepam (ATIVAN) tablet 1 mg  1 mg Oral Q4H PRN Parks Ranger, DO   1 mg at 11/27/21 0910   magnesium hydroxide (MILK OF MAGNESIA) suspension 30 mL  30 mL Oral Daily PRN Parks Ranger, DO       metoprolol tartrate (LOPRESSOR) tablet 25 mg  25 mg Oral Q2000 Parks Ranger, DO   25 mg at 11/26/21 2110   mirtazapine (REMERON) tablet 15 mg  15 mg Oral QHS Parks Ranger, DO   15 mg at 11/26/21 2110   nitroGLYCERIN (NITROSTAT) SL tablet 0.4 mg  0.4 mg Sublingual Q5 min PRN Parks Ranger, DO       OLANZapine [facility]) tablet 10 mg  10 mg Oral QHS Parks Ranger, DO   10 mg at 11/26/21 2110   ondansetron (ZOFRAN) tablet 4 mg  4 mg Oral Q8H PRN  Parks Ranger, DO   4 mg at 11/26/21 2352   pantoprazole (PROTONIX) EC tablet 40 mg  40 mg Oral Daily Parks Ranger, DO   40 mg at 11/27/21 0910   polyethylene glycol (MIRALAX / GLYCOLAX) packet 17 g  17 g Oral Daily Parks Ranger, DO   17 g at 11/27/21 8786   risperiDONE (RISPERDAL) tablet 2 mg  2 mg Oral Q6H PRN Parks Ranger, DO       rosuvastatin (CRESTOR) tablet 40 mg  40 mg Oral Daily Parks Ranger, DO   40 mg at 11/27/21 0910   traZODone (DESYREL) tablet 100 mg  100 mg Oral QHS PRN Parks Ranger, DO       PTA Medications: Medications Prior to Admission  Medication Sig Dispense Refill Last Dose   CALCIUM-VITAMIN D PO Take 1 capsule by mouth daily. 1200-1000 calcium-vitamin d      fluticasone (FLONASE) 50 MCG/ACT nasal spray Place 2 sprays into both nostrils daily.      furosemide (LASIX) 20 MG tablet Take 1 tablet (20 mg total) by mouth daily. 30 tablet     levothyroxine (SYNTHROID, LEVOTHROID) 137 MCG tablet Take 137 mcg by mouth daily before breakfast.      Magnesium 100 MG CAPS Take 100 mg by mouth daily.      metoprolol tartrate (LOPRESSOR) 25 MG tablet TAKE 1/2 TO 1 TABLET IN THE MORNING AND AT BEDTIME FOR PALPITATIONS. NEED APPOINTMENT FOR FURTHER REFILLS (Patient taking differently: Take 12.5-25 mg by mouth in the morning and at bedtime.) 15 tablet 0    mirtazapine (REMERON) 15 MG tablet Take 1 tablet (15 mg total) by mouth at bedtime. 30 tablet 1    nitroGLYCERIN (NITROSTAT) 0.4 MG SL tablet Place 0.4 mg under the tongue every 5 (five) minutes as needed for chest pain.      OLANZapine (ZYPREXA) 10 MG tablet Take 1 tablet (10 mg total) by mouth at bedtime. 30 tablet 1    ondansetron (ZOFRAN) 4 MG tablet Take 4 mg by mouth every 8 (eight) hours as needed for nausea or vomiting.      polyethylene glycol (MIRALAX / GLYCOLAX) 17 g packet Take 17 g by mouth daily. 14 each 0    rosuvastatin (CRESTOR) 40 MG tablet Take 1 tablet  (40 mg total) by mouth daily. 90 tablet 3    senna-docusate (SENOKOT-S) 8.6-50 MG tablet Take 1 tablet by mouth at bedtime.      sertraline (ZOLOFT) 50 MG tablet Take 1 tablet (50 mg total) by mouth daily. Take 1/2 tablet 25 mg for 7 days, then take one whole tablet 50 mg total daily after breakfast (Patient taking differently: Take 50 mg by mouth daily before breakfast.) 30 tablet 1    Vitamin D, Ergocalciferol, (DRISDOL) 1.25 MG (50000 UNIT) CAPS capsule Take 50,000 Units by mouth every Wednesday.       Musculoskeletal: Strength &  Muscle Tone: within normal limits Gait & Station: unsteady Patient leans: N/A            Psychiatric Specialty Exam:  Presentation  General Appearance:  Appropriate for Environment; Casual; Fairly Groomed  Eye Contact: Good  Speech: Clear and Coherent; Normal Rate  Speech Volume: Normal  Handedness: Right   Mood and Affect  Mood: Anxious  Affect: Appropriate; Congruent   Thought Process  Thought Processes: Coherent  Duration of Psychotic Symptoms: N/A  Past Diagnosis of Schizophrenia or Psychoactive disorder: No  Descriptions of Associations:Intact  Orientation:Full (Time, Place and Person)  Thought Content:Logical; Rumination  Hallucinations:No data recorded Ideas of Reference:None  Suicidal Thoughts:No data recorded Homicidal Thoughts:No data recorded  Sensorium  Memory: Immediate Fair; Recent Fair; Remote Fair  Judgment: Fair  Insight: Fair   Community education officer  Concentration: Good  Attention Span: Good  Recall: [place] of Knowledge: Fair  Language: Good   Psychomotor Activity  Psychomotor Activity:No data recorded  Assets  Assets: Communication Skills; Desire for Improvement; Physical Health; Resilience   Sleep  Sleep:No data recorded   Physical Exam: Physical Exam Constitutional:      Appearance: Normal appearance.  HENT:     Head: Normocephalic and atraumatic.      Mouth/Throat:     Pharynx: Oropharynx is clear.  Eyes:     Pupils: Pupils are equal, round, and reactive to light.  Cardiovascular:     Rate and Rhythm: Normal rate and regular rhythm.  Pulmonary:     Effort: Pulmonary effort is normal.     Breath sounds: Normal breath sounds.  Abdominal:     General: Abdomen is flat.     Palpations: Abdomen is soft.  Musculoskeletal:        General: Normal range of motion.  Skin:    General: Skin is warm and dry.  Neurological:     General: No focal deficit present.     Mental Status: She is alert. Mental status is at baseline.  Psychiatric:        Attention and Perception: Attention normal. She perceives visual hallucinations.        Mood and Affect: Mood is anxious and depressed. Affect is flat.        Speech: Speech is tangential.        Behavior: Behavior is slowed. Behavior is cooperative.        Thought Content: Thought content normal.        Cognition and Memory: Memory normal. Cognition is impaired.        Judgment: Judgment normal.    Review of Systems  Constitutional: Negative.   HENT: Negative.    Eyes: Negative.   Respiratory: Negative.    Cardiovascular: Negative.   Gastrointestinal: Negative.   Genitourinary: Negative.   Musculoskeletal: Negative.   Skin: Negative.   Neurological: Negative.   Endo/Heme/Allergies: Negative.   Psychiatric/Behavioral:  Positive for depression and hallucinations. The patient is nervous/anxious and has insomnia.    Blood pressure (!) 88/77, pulse 90, temperature 98.8 F (37.1 C), temperature source Oral, resp. rate 18, height '5\' 3"'$  (1.6 m), weight 86.2 kg, SpO2 97 %. Body mass index is 33.66 kg/m.  Treatment Plan Summary: Daily contact with patient to assess and evaluate symptoms and progress in treatment, Medication management, and Plan see orders  Observation Level/Precautions:  15 minute checks  Laboratory:  CBC Chemistry Profile  Psychotherapy:    Medications:    Consultations:     Discharge Concerns:  Estimated LOS:  Other:     Physician Treatment Plan for Primary Diagnosis: Major depressive disorder, single episode, severe with psychotic features ([place]) Long Term Goal(s): Improvement in symptoms so as ready for discharge  Short Term Goals: Ability to identify changes in lifestyle to reduce recurrence of condition will improve, Ability to verbalize feelings will improve, Ability to disclose and discuss suicidal ideas, Ability to demonstrate self-control will improve, Ability to identify and develop effective coping behaviors will improve, Ability to maintain clinical measurements within normal limits will improve, Compliance with prescribed medications will improve, and Ability to identify triggers associated with substance abuse/mental health issues will improve  Physician Treatment Plan for Secondary Diagnosis: Principal Problem:   Major depressive disorder, single episode, severe with psychotic features ([place])   I certify that inpatient services furnished can reasonably be expected to improve the patient's condition.    Parks Ranger, DO 11/16/202310:29 AM

## 2021-11-28 ENCOUNTER — Other Ambulatory Visit: Payer: Self-pay | Admitting: Behavioral Health

## 2021-11-28 DIAGNOSIS — F323 Major depressive disorder, single episode, severe with psychotic features: Secondary | ICD-10-CM | POA: Diagnosis not present

## 2021-11-28 DIAGNOSIS — F410 Panic disorder [episodic paroxysmal anxiety] without agoraphobia: Secondary | ICD-10-CM

## 2021-11-28 DIAGNOSIS — R443 Hallucinations, unspecified: Secondary | ICD-10-CM

## 2021-11-28 DIAGNOSIS — F333 Major depressive disorder, recurrent, severe with psychotic symptoms: Secondary | ICD-10-CM

## 2021-11-28 DIAGNOSIS — F5105 Insomnia due to other mental disorder: Secondary | ICD-10-CM

## 2021-11-28 DIAGNOSIS — F411 Generalized anxiety disorder: Secondary | ICD-10-CM

## 2021-11-28 MED ORDER — METOPROLOL TARTRATE 25 MG PO TABS
12.5000 mg | ORAL_TABLET | Freq: Every day | ORAL | Status: DC
  Administered 2021-11-28 – 2021-12-02 (×4): 12.5 mg via ORAL
  Filled 2021-11-28 (×4): qty 1

## 2021-11-28 MED ORDER — RISPERIDONE 1 MG PO TABS: 1.0000 mg | ORAL_TABLET | Freq: Four times a day (QID) | ORAL | Status: DC | PRN

## 2021-11-28 MED ORDER — TRAMADOL HCL 50 MG PO TABS
50.0000 mg | ORAL_TABLET | Freq: Four times a day (QID) | ORAL | Status: DC | PRN
  Administered 2021-11-28 – 2021-12-01 (×6): 50 mg via ORAL
  Filled 2021-11-28 (×6): qty 1

## 2021-11-28 MED ORDER — FUROSEMIDE 20 MG PO TABS
10.0000 mg | ORAL_TABLET | Freq: Every day | ORAL | Status: DC
  Administered 2021-11-29 – 2021-12-03 (×5): 10 mg via ORAL
  Filled 2021-11-28 (×5): qty 1

## 2021-11-28 MED ORDER — LIDOCAINE 5 % EX PTCH
1.0000 | MEDICATED_PATCH | CUTANEOUS | Status: DC
  Administered 2021-11-28 – 2021-12-03 (×6): 1 via TRANSDERMAL
  Filled 2021-11-28 (×6): qty 1

## 2021-11-28 NOTE — Group Note (Signed)
[facility] LCSW Group Therapy Note   Group Date: 11/28/2021 Start Time: 1330 End Time: 1345  Type of Therapy/Topic:  Group Therapy:  Feelings about Diagnosis  Participation Level:  Did Not Attend      Description of Group:    This group will allow patients to explore their thoughts and feelings about diagnoses they have received. Patients will be guided to explore their level of understanding and acceptance of these diagnoses. Facilitator will encourage patients to process their thoughts and feelings about the reactions of others to their diagnosis, and will guide patients in identifying ways to discuss their diagnosis with significant others in their lives. This group will be process-oriented, with patients participating in exploration of their own experiences as well as giving and receiving support and challenge from other group members.   Therapeutic Goals: 1. Patient will demonstrate understanding of diagnosis as evidence by identifying two or more symptoms of the disorder:  2. Patient will be able to express two feelings regarding the diagnosis 3. Patient will demonstrate ability to communicate their needs through discussion and/or role plays  Summary of Patient Progress:    X    Therapeutic Modalities:   Cognitive Behavioral Therapy Brief Therapy Feelings Identification    Danielle [name], LCSWA

## 2021-11-28 NOTE — Progress Notes (Signed)
Occupational Therapy Treatment Patient Details Name: Danielle [name] MRN: [id number] DOB: 11/15/1945 Today's Date: 11/28/2021   History of present illness Patient is a 76 year old white female who was voluntarily admitted on transfer from [facility] with a diagnosis of pseudodementia.  Long history of depression and recent memory problems. Initially presented to the hospital on 11/12 with SOB, LE swelling, worsening panic attacks and recent admission prior to that with falls, lumbar compression Fx with d/c recommended for SNF at that time. PT asked to see for numerus falls recently   OT comments  Danielle [name] was seen for OT treatment on this date. Upon arrival to room pt walking in hallway, agreeable to tx. Pt requires CGA + RW for ~200 ft mobility - improved safety awareness and positionging prior to sitting. CGA for clothing mgmt and pericare in standing. MIN cues to sequence hand washing. Pt completed the SLUMS - a 30-point screening questionnaire that tests orientation, memory, attention, problem solving, and executive function. Pt scored a 10/30 indicating Dementia and the need for further assessment. Pt with noted impairments in working memory and recognizing errors limiting ability to participate functionally in ADLs. Pt making good progress toward goals, will continue to follow POC. Discharge recommendation remains appropriate.     Recommendations for follow up therapy are one component of a multi-disciplinary discharge planning process, led by the attending physician.  Recommendations may be updated based on patient status, additional functional criteria and insurance authorization.    Follow Up Recommendations  Home health OT     Assistance Recommended at Discharge Frequent or constant Supervision/Assistance  Patient can return home with the following  A little help with walking and/or transfers;Assistance with cooking/housework;Direct supervision/assist for medications management;Assist  for transportation;Help with stairs or ramp for entrance;A little help with bathing/dressing/bathroom   Equipment Recommendations  BSC/3in1    Recommendations for Other Services      Precautions / Restrictions Precautions Precautions: Fall Restrictions Weight Bearing Restrictions: No       Mobility Bed Mobility               General bed mobility comments: not tested    Transfers Overall transfer level: Needs assistance Equipment used: Rolling walker (2 wheels) Transfers: Sit to/from Stand Sit to Stand: Min guard                 Balance Overall balance assessment: Needs assistance Sitting-balance support: Feet supported Sitting balance-Leahy Scale: Fair     Standing balance support: No upper extremity supported, During functional activity Standing balance-Leahy Scale: Fair                             ADL either performed or assessed with clinical judgement   ADL Overall ADL's : Needs assistance/impaired                                       General ADL Comments: CGA + RW for ~200 ft mobility - improved safety awareness and positionging prior to sitting. CGA for clothing mgmt and pericare in standing. MIN cues to sequence hand washing.    Extremity/Trunk Assessment Upper Extremity Assessment Upper Extremity Assessment: Generalized weakness   Lower Extremity Assessment Lower Extremity Assessment: Generalized weakness         Cognition Arousal/Alertness: Awake/alert Behavior During Therapy: Flat affect, Anxious Overall Cognitive Status: History of cognitive impairments -  at baseline                                 General Comments: Orientation log 21/30. SLUMS 10/30         Pertinent Vitals/ Pain       Pain Assessment Pain Assessment: Faces Faces Pain Scale: Hurts a little bit Pain Location: back Pain Descriptors / Indicators: Discomfort Pain Intervention(s): Limited activity within patient's  tolerance, Repositioned   Frequency  Min 2X/week        Progress Toward Goals  OT Goals(current goals can now be found in the care plan section)  Progress towards OT goals: Progressing toward goals  Acute Rehab OT Goals Patient Stated Goal: to go home OT Goal Formulation: With patient Time For Goal Achievement: 12/11/21 Potential to Achieve Goals: Good ADL Goals Pt Will Perform Grooming: with modified independence;standing Pt Will Perform Lower Body Dressing: with modified independence;sit to/from stand Pt Will Transfer to Toilet: with modified independence;regular height toilet;ambulating  Plan Discharge plan remains appropriate;Frequency remains appropriate    Co-evaluation                 AM-PAC OT "6 Clicks" Daily Activity     Outcome Measure   Help from another person eating meals?: None Help from another person taking care of personal grooming?: A Little Help from another person toileting, which includes using toliet, bedpan, or urinal?: A Little Help from another person bathing (including washing, rinsing, drying)?: A Lot Help from another person to put on and taking off regular upper body clothing?: A Little Help from another person to put on and taking off regular lower body clothing?: A Lot 6 Click Score: 17    End of Session    OT Visit Diagnosis: Unsteadiness on feet (R26.81);Muscle weakness (generalized) (M62.81)   Activity Tolerance Patient tolerated treatment well   Patient Left in chair;with call bell/phone within reach;with nursing/sitter in room   Nurse Communication Mobility status        Time: 3875-6433 OT Time Calculation (min): 32 min  Charges: OT General Charges $OT Visit: 1 Visit OT Treatments $Self Care/Home Management : 8-22 mins $Therapeutic Activity: 8-22 mins  Dessie Coma, M.S. OTR/L  11/28/21, 12:46 PM  ascom [phone]

## 2021-11-28 NOTE — Progress Notes (Signed)
0715 - Pt in room with sitter.  0815 - Pt in bedroom in bed talking with sitter.  0915 - Pt in bed with sitter at bedside. NAD. 1015 - Pt laying in bed on her side resting. NAD. Sitter at bedside.  1115 - Pt being assisted to bathroom by sitter. NAD.  1215 - Pt in dayroom for lunch. 1315 - Pt laying in bed resting with eyes closed. NAD. Sitter at bedside. 1415 - Pt at nurses station  1515 - Pt walking around in room. Sitter present.   1615 - Pt in dayroom. 1715 - Pt laying in bed resting with eyes closed. NAD. Sitter at bedside. 30 - Pt in hall with sitter. NAD.

## 2021-11-28 NOTE — Progress Notes (Signed)
[facility] MD Progress Note  11/28/2021 11:23 AM Danielle [name]  MRN:  [id number] Subjective: Danielle [name] is seen on rounds.  She has had some low blood pressure and some nausea.  I am going to make some adjustments in her blood pressure medications.  Her thought process has improved and her cognition is better.  She is alert and oriented x 3.  She is pleasant and cooperative but still has trouble finding her words.  She says that she has not had any hallucinations since starting on new medication.  She says that she slept pretty well.  She still labile with poor insight.  She asked when she is going home.  I told her that she has had a week of some trouble and that she could benefit from some observation and medications.  I told her that she will go home hopefully before Thanksgiving and she started crying.  I think the new medicines are helping already.  No side effects.  Principal Problem: Major depressive disorder, single episode, severe with psychotic features ([facility]) Diagnosis: Principal Problem:   Major depressive disorder, single episode, severe with psychotic features ([place])  Total Time spent with patient: 15 minutes  Past Psychiatric History: Quite extensive but I do not see any admissions even though she said that she has been admitted numerous times in the past mostly at [facility].  She recently was going to [facility] and seeing [name] but then [name] went on a vacation and she got frustrated and switched to [employer] at [place].  She has been on numerous medications in the past.  Past Medical History:  Past Medical History:  Diagnosis Date   Arthritis 02/13/2011   osteoarthritis, hips, knees,s/p Cervical fusion(DDD)   Atherosclerosis of aorta ([place]) 05/30/2015   on CTA chest   CAD (coronary artery disease)    a. 09/2016: Coronary CT showing mid-LAD plaque with 20-50% associated stenosis but no significant stenosis by FFR analysis.    Dementia ([place])    Depression with anxiety     Diabetes mellitus without complication ([place])    Difficult intubation 02/13/2011   with surgery -multiple times, no problems with recent surgeries   GERD (gastroesophageal reflux disease) 02/13/2011   tx. Omeprazole   Glaucoma 02/13/2011   bil. tx. eye drops daily   Headache(784.0) 02/13/2011   past hx. migraines, none recent   Heart murmur 02/13/2011   was told-no issues with this   Hypercholesterolemia    Hypertension 02/13/2011   Hypothyroidism 02/13/2011   tx. with Levothyroxine   Major depressive disorder    Mitral regurgitation 06/01/2015   Mild   Sinus drainage 02/13/2011   uses Zyrtec as needed   Sleep apnea 02/13/2011   Hx. sleep apnea-uses cpap since 10'12 nightly    Past Surgical History:  Procedure Laterality Date   ABDOMINAL HYSTERECTOMY  02/13/2011   APPENDECTOMY  02/13/2011   Lap. removal '92   BREAST CYST EXCISION Left    CERVICAL FUSION  02/13/2011   '92- retained hardware   CHOLECYSTECTOMY  02/13/2011   '98-lap. galbladder removal due to stones   GUM SURGERY  02/13/2011   "small mouth"   JOINT REPLACEMENT  02/13/2011   RTHA-a yr ago in [place] Left 08/15/2019   Procedure: EXCISION OF SEBACEOUS CYST LEFT BREAST;  Surgeon: Georganna Skeans, MD;  Location: [facility];  Service: General;  Laterality: Left;   TONSILLECTOMY  02/13/2011   child   TOTAL HIP ARTHROPLASTY  02/17/2011  Procedure: TOTAL HIP ARTHROPLASTY ANTERIOR APPROACH;  Surgeon: Mauri Pole, MD;  Location: [facility];  Service: Orthopedics;  Laterality: Left;   TUBAL LIGATION  02/13/2011   Family History:  Family History  Problem Relation Age of Onset   Cancer Mother        lung   Breast cancer Maternal Aunt    Alzheimer's disease Neg Hx    Dementia Neg Hx    Family Psychiatric  History: Unremarkable Social History:  Social History   Substance and Sexual Activity  Alcohol Use Yes   Comment: rare     Social History   Substance and Sexual Activity   Drug Use No    Social History   Socioeconomic History   Marital status: Married    Spouse name: Not on file   Number of children: Not on file   Years of education: Not on file   Highest education level: Not on file  Occupational History   Occupation: Retired Not working  Tobacco Use   Smoking status: Never   Smokeless tobacco: Never  Vaping Use   Vaping Use: Never used  Substance and Sexual Activity   Alcohol use: Yes    Comment: rare   Drug use: No   Sexual activity: Yes    Birth control/protection: Surgical    Comment: hyst  Other Topics Concern   Not on file  Social History Narrative   Caffeine le-ss then once cup daily. Education: HS diploma.  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     Social Determinants of Health   Financial Resource Strain: Not on file  Food Insecurity: No Food Insecurity (11/26/2021)   Hunger Vital Sign    Worried About Running Out of Food in the Last Year: Never true    Ran Out of Food in the Last Year: Never true  Transportation Needs: No Transportation Needs (11/26/2021)   PRAPARE - Hydrologist (Medical): No    Lack of Transportation (Non-Medical): No  Physical Activity: Not on file  Stress: Not on file  Social Connections: Not on file   Additional Social History:                         Sleep: Good  Appetite:  Good  Current Medications: Current Facility-Administered Medications  Medication Dose Route Frequency Provider Last Rate Last Admin   alum & mag hydroxide-simeth (MAALOX/MYLANTA) 200-200-20 MG/5ML suspension 30 mL  30 mL Oral Q4H PRN Parks [place], DO       FLUoxetine (PROZAC) capsule 20 mg  20 mg Oral Daily Parks Ranger, DO   20 mg at 11/28/21 0928   fluticasone (FLONASE) 50 MCG/ACT nasal spray 2 spray  2 spray Each Nare Daily Parks Ranger, DO   2 spray at 11/28/21 0928   [START ON 11/29/2021] furosemide (LASIX) tablet 10 mg  10 mg  Oral Daily Parks Ranger, DO       hydrocortisone cream 0.5 %   Topical TID PRN Parks [place], DO   Given at 11/27/21 2126   ibuprofen (ADVIL) tablet 600 mg  600 mg Oral Q6H PRN Caroline Sauger, NP   600 mg at 11/27/21 2345   levothyroxine (SYNTHROID) tablet 150 mcg  150 mcg Oral Q0600 Parks Ranger, DO   150 mcg at 11/28/21 0647   lidocaine (LIDODERM) 5 % 1 patch  1 patch Transdermal Q24H Caroline Sauger, NP  1 patch at 11/28/21 0031   LORazepam (ATIVAN) tablet 1 mg  1 mg Oral Q4H PRN Parks Ranger, DO   1 mg at 11/27/21 5643   magnesium hydroxide (MILK OF MAGNESIA) suspension 30 mL  30 mL Oral Daily PRN Parks Ranger, DO       metoprolol tartrate (LOPRESSOR) tablet 12.5 mg  12.5 mg Oral Q2000 Parks Ranger, DO       mirtazapine (REMERON) tablet 15 mg  15 mg Oral QHS Parks Ranger, DO   15 mg at 11/27/21 2126   nitroGLYCERIN (NITROSTAT) SL tablet 0.4 mg  0.4 mg Sublingual Q5 min PRN Parks Ranger, DO       OLANZapine [facility]) tablet 10 mg  10 mg Oral QHS Parks Ranger, DO   10 mg at 11/27/21 2126   ondansetron (ZOFRAN) tablet 4 mg  4 mg Oral Q8H PRN Parks Ranger, DO   4 mg at 11/27/21 2010   pantoprazole (PROTONIX) EC tablet 40 mg  40 mg Oral Daily Parks Ranger, DO   40 mg at 11/28/21 3295   polyethylene glycol (MIRALAX / GLYCOLAX) packet 17 g  17 g Oral Daily Parks Ranger, DO   17 g at 11/28/21 1884   risperiDONE (RISPERDAL) tablet 2 mg  2 mg Oral Q6H PRN Parks Ranger, DO       rosuvastatin (CRESTOR) tablet 40 mg  40 mg Oral Daily Parks Ranger, DO   40 mg at 11/28/21 1660   traZODone (DESYREL) tablet 100 mg  100 mg Oral QHS PRN Parks [place], DO        Lab Results: No results found for this or any previous visit (from the past 48 hour(s)).  Blood Alcohol level:  No results found for: "ETH"  Metabolic Disorder Labs: Lab  Results  Component Value Date   HGBA1C 5.7 (H) 10/28/2021   MPG 116.89 10/28/2021   MPG 114.02 10/08/2016   No results found for: "PROLACTIN" Lab Results  Component Value Date   CHOL 170 10/08/2016   TRIG 105 10/08/2016   HDL 63 10/08/2016   CHOLHDL 2.7 10/08/2016   VLDL 21 10/08/2016   LDLCALC 86 10/08/2016   LDLCALC 76 05/31/2015    Physical Findings: AIMS:  , ,  ,  ,    CIWA:    COWS:     Musculoskeletal: Strength & Muscle Tone: within normal limits Gait & Station: normal Patient leans: N/A  Psychiatric Specialty Exam:  Presentation  General Appearance:  Appropriate for Environment; Casual; Fairly Groomed  Eye Contact: Good  Speech: Clear and Coherent; Normal Rate  Speech Volume: Normal  Handedness: Right   Mood and Affect  Mood: Anxious  Affect: Appropriate; Congruent   Thought Process  Thought Processes: Coherent  Descriptions of Associations:Intact  Orientation:Full (Time, Place and Person)  Thought Content:Logical; Rumination  History of Schizophrenia/Schizoaffective disorder:No  Duration of Psychotic Symptoms:N/A  Hallucinations:No data recorded Ideas of Reference:None  Suicidal Thoughts:No data recorded Homicidal Thoughts:No data recorded  Sensorium  Memory: Immediate Fair; Recent Fair; Remote Fair  Judgment: Fair  Insight: Fair   Community education officer  Concentration: Good  Attention Span: Good  Recall: [place] of Knowledge: Fair  Language: Good   Psychomotor Activity  Psychomotor Activity:No data recorded  Assets  Assets: Communication Skills; Desire for Improvement; Physical Health; Resilience   Sleep  Sleep:No data recorded   Physical Exam: Physical Exam Vitals and nursing note reviewed.  Constitutional:  Appearance: Normal appearance. She is normal weight.  Neurological:     General: No focal deficit present.     Mental Status: She is alert and oriented to person, place, and  time.  Psychiatric:        Attention and Perception: Attention and perception normal.        Mood and Affect: Mood is depressed. Affect is tearful.        Speech: Speech normal.        Behavior: Behavior normal. Behavior is cooperative.        Thought Content: Thought content normal.        Cognition and Memory: Memory normal. Cognition is impaired.        Judgment: Judgment is inappropriate.    Review of Systems  Constitutional: Negative.   HENT: Negative.    Eyes: Negative.   Respiratory: Negative.    Cardiovascular: Negative.   Gastrointestinal: Negative.   Genitourinary: Negative.   Musculoskeletal: Negative.   Skin: Negative.   Neurological: Negative.   Endo/Heme/Allergies: Negative.   Psychiatric/Behavioral:  Positive for depression. The patient is nervous/anxious.    Blood pressure (!) 107/51, pulse 95, temperature 98.2 F (36.8 C), temperature source Oral, resp. rate 17, height '5\' 3"'$  (1.6 m), weight 86.2 kg, SpO2 96 %. Body mass index is 33.66 kg/m.   Treatment Plan Summary: Daily contact with patient to assess and evaluate symptoms and progress in treatment, Medication management, and Plan decrease Lasix to 10 mg and Lopressor to 12.5 mg.  Also, decrease Lasix to 10 mg.  Continue Zyprexa, Prozac, and Remeron.  [place], DO 11/28/2021, 11:23 AM

## 2021-11-28 NOTE — Plan of Care (Signed)
D- Patient alert and oriented. Patient presents with a sad affect and mood. Patient denies SI, HI, AVH, but endorses pain. Patient states that back pain an 8 on a scale from 0-10. MD made aware. PRN Ibuprofen administered. Patient endorses anxiety and depression.   A- Scheduled medications administered to patient, per MD orders. Support and encouragement provided.  Routine safety checks conducted every 15 minutes.  Patient informed to notify staff with problems or concerns. Pain medication order updated per MD.  R- No adverse drug reactions noted. Patient contracts for safety at this time. Patient compliant with medications and treatment plan. Patient receptive, calm, and cooperative. Patient interacts well with others on the unit.  Patient remains safe at this time.   Problem: Safety: Goal: Non-violent Restraint(s) Outcome: Progressing   Problem: Nutrition: Goal: Adequate nutrition will be maintained Outcome: Progressing   Problem: Skin Integrity: Goal: Risk for impaired skin integrity will decrease Outcome: Progressing   Problem: Health Behavior/Discharge Planning: Goal: Ability to manage health-related needs will improve Outcome: Not Progressing   Problem: Education: Goal: Emotional status will improve Outcome: Not Progressing Goal: Mental status will improve Outcome: Not Progressing   Problem: Activity: Goal: Interest or engagement in activities will improve Outcome: Not Progressing

## 2021-11-28 NOTE — Progress Notes (Signed)
1:1 Hourly Rounding  1930: Pt in bedroom calm and composed, safety sitter present.  2030: Pt in bedroom calm and composed, safety sitter present.  2130: Pt in bedroom calm and composed, safety sitter present.  2230: Pt in bedroom sleeping, breathing is unlabored and regular, raise and fall of chest seen. Safety sitter present.  2330: Pt in bedroom calm and composed, safety sitter present.  0030:  0130:  0230:  0330:  0430:  0530:  0630:  0730:

## 2021-11-28 NOTE — BH IP Treatment Plan (Signed)
Interdisciplinary Treatment and Diagnostic Plan Update  11/28/2021 Time of Session: 9:30AM Danielle [name] MRN: [id number]  Principal Diagnosis: Major depressive disorder, single episode, severe with psychotic features ([place])  Secondary Diagnoses: Principal Problem:   Major depressive disorder, single episode, severe with psychotic features ([place])   Current Medications:  Current Facility-Administered Medications  Medication Dose Route Frequency Provider Last Rate Last Admin   alum & mag hydroxide-simeth (MAALOX/MYLANTA) 200-200-20 MG/5ML suspension 30 mL  30 mL Oral Q4H PRN Parks Ranger, DO       FLUoxetine (PROZAC) capsule 20 mg  20 mg Oral Daily Parks Ranger, DO   20 mg at 11/28/21 0928   fluticasone (FLONASE) 50 MCG/ACT nasal spray 2 spray  2 spray Each Nare Daily Parks Ranger, DO   2 spray at 11/28/21 0865   furosemide (LASIX) tablet 20 mg  20 mg Oral Daily Parks Ranger, DO   20 mg at 11/28/21 7846   hydrocortisone cream 0.5 %   Topical TID PRN Parks Ranger, DO   Given at 11/27/21 2126   ibuprofen (ADVIL) tablet 600 mg  600 mg Oral Q6H PRN Caroline Sauger, NP   600 mg at 11/27/21 2345   levothyroxine (SYNTHROID) tablet 150 mcg  150 mcg Oral Q0600 Parks Ranger, DO   150 mcg at 11/28/21 0647   lidocaine (LIDODERM) 5 % 1 patch  1 patch Transdermal Q24H Caroline Sauger, NP   1 patch at 11/28/21 0031   LORazepam (ATIVAN) tablet 1 mg  1 mg Oral Q4H PRN Parks Ranger, DO   1 mg at 11/27/21 0910   magnesium hydroxide (MILK OF MAGNESIA) suspension 30 mL  30 mL Oral Daily PRN Parks Ranger, DO       metoprolol tartrate (LOPRESSOR) tablet 25 mg  25 mg Oral Q2000 Parks Ranger, DO   25 mg at 11/26/21 2110   mirtazapine (REMERON) tablet 15 mg  15 mg Oral QHS Parks Ranger, DO   15 mg at 11/27/21 2126   nitroGLYCERIN (NITROSTAT) SL tablet 0.4 mg  0.4 mg Sublingual Q5 min PRN Parks Ranger, DO       OLANZapine (ZYPREXA) tablet 10 mg  10 mg Oral QHS Parks Ranger, DO   10 mg at 11/27/21 2126   ondansetron (ZOFRAN) tablet 4 mg  4 mg Oral Q8H PRN Parks Ranger, DO   4 mg at 11/27/21 2010   pantoprazole (PROTONIX) EC tablet 40 mg  40 mg Oral Daily Parks Ranger, DO   40 mg at 11/28/21 9629   polyethylene glycol (MIRALAX / GLYCOLAX) packet 17 g  17 g Oral Daily Parks Ranger, DO   17 g at 11/28/21 5284   risperiDONE (RISPERDAL) tablet 2 mg  2 mg Oral Q6H PRN Parks Ranger, DO       rosuvastatin (CRESTOR) tablet 40 mg  40 mg Oral Daily Parks Ranger, DO   40 mg at 11/28/21 1324   traZODone (DESYREL) tablet 100 mg  100 mg Oral QHS PRN Parks Ranger, DO       PTA Medications: Medications Prior to Admission  Medication Sig Dispense Refill Last Dose   CALCIUM-VITAMIN D PO Take 1 capsule by mouth daily. 1200-1000 calcium-vitamin d      fluticasone (FLONASE) 50 MCG/ACT nasal spray Place 2 sprays into both nostrils daily.      furosemide (LASIX) 20 MG tablet Take 1 tablet (20 mg total) by mouth  daily. 30 tablet     levothyroxine (SYNTHROID, LEVOTHROID) 137 MCG tablet Take 137 mcg by mouth daily before breakfast.      Magnesium 100 MG CAPS Take 100 mg by mouth daily.      metoprolol tartrate (LOPRESSOR) 25 MG tablet TAKE 1/2 TO 1 TABLET IN THE MORNING AND AT BEDTIME FOR PALPITATIONS. NEED APPOINTMENT FOR FURTHER REFILLS (Patient taking differently: Take 12.5-25 mg by mouth in the morning and at bedtime.) 15 tablet 0    mirtazapine (REMERON) 15 MG tablet Take 1 tablet (15 mg total) by mouth at bedtime. 30 tablet 1    nitroGLYCERIN (NITROSTAT) 0.4 MG SL tablet Place 0.4 mg under the tongue every 5 (five) minutes as needed for chest pain.      OLANZapine (ZYPREXA) 10 MG tablet Take 1 tablet (10 mg total) by mouth at bedtime. 30 tablet 1    ondansetron (ZOFRAN) 4 MG tablet Take 4 mg by mouth every 8 (eight) hours  as needed for nausea or vomiting.      polyethylene glycol (MIRALAX / GLYCOLAX) 17 g packet Take 17 g by mouth daily. 14 each 0    rosuvastatin (CRESTOR) 40 MG tablet Take 1 tablet (40 mg total) by mouth daily. 90 tablet 3    senna-docusate (SENOKOT-S) 8.6-50 MG tablet Take 1 tablet by mouth at bedtime.      sertraline (ZOLOFT) 50 MG tablet Take 1 tablet (50 mg total) by mouth daily. Take 1/2 tablet 25 mg for 7 days, then take one whole tablet 50 mg total daily after breakfast (Patient taking differently: Take 50 mg by mouth daily before breakfast.) 30 tablet 1    Vitamin D, Ergocalciferol, (DRISDOL) 1.25 MG (50000 UNIT) CAPS capsule Take 50,000 Units by mouth every Wednesday.       Patient Stressors: Health problems    Patient Strengths: Scientist, research (life sciences)   Treatment Modalities: Medication Management, Group therapy, Case management,  1 to 1 session with clinician, Psychoeducation, Recreational therapy.   Physician Treatment Plan for Primary Diagnosis: Major depressive disorder, single episode, severe with psychotic features ([place]) Long Term Goal(s): Improvement in symptoms so as ready for discharge   Short Term Goals: Ability to identify changes in lifestyle to reduce recurrence of condition will improve Ability to verbalize feelings will improve Ability to disclose and discuss suicidal ideas Ability to demonstrate self-control will improve Ability to identify and develop effective coping behaviors will improve Ability to maintain clinical measurements within normal limits will improve Compliance with prescribed medications will improve Ability to identify triggers associated with substance abuse/mental health issues will improve  Medication Management: Evaluate patient's response, side effects, and tolerance of medication regimen.  Therapeutic Interventions: 1 to 1 sessions, Unit Group sessions and Medication administration.  Evaluation of Outcomes: Progressing  Physician Treatment  Plan for Secondary Diagnosis: Principal Problem:   Major depressive disorder, single episode, severe with psychotic features ([place])  Long Term Goal(s): Improvement in symptoms so as ready for discharge   Short Term Goals: Ability to identify changes in lifestyle to reduce recurrence of condition will improve Ability to verbalize feelings will improve Ability to disclose and discuss suicidal ideas Ability to demonstrate self-control will improve Ability to identify and develop effective coping behaviors will improve Ability to maintain clinical measurements within normal limits will improve Compliance with prescribed medications will improve Ability to identify triggers associated with substance abuse/mental health issues will improve     Medication Management: Evaluate patient's response, side effects, and tolerance of medication regimen.  Therapeutic Interventions: 1 to 1 sessions, Unit Group sessions and Medication administration.  Evaluation of Outcomes: Progressing   RN Treatment Plan for Primary Diagnosis: Major depressive disorder, single episode, severe with psychotic features ([place]) Long Term Goal(s): Knowledge of disease and therapeutic regimen to maintain health will improve  Short Term Goals: Ability to remain free from injury will improve, Ability to verbalize frustration and anger appropriately will improve, Ability to demonstrate self-control, Ability to participate in decision making will improve, Ability to verbalize feelings will improve, Ability to identify and develop effective coping behaviors will improve, and Compliance with prescribed medications will improve  Medication Management: RN will administer medications as ordered by provider, will assess and evaluate patient's response and provide education to patient for prescribed medication. RN will report any adverse and/or side effects to prescribing provider.  Therapeutic Interventions: 1 on 1 counseling sessions,  Psychoeducation, Medication administration, Evaluate responses to treatment, Monitor vital signs and CBGs as ordered, Perform/monitor CIWA, COWS, AIMS and Fall Risk screenings as ordered, Perform wound care treatments as ordered.  Evaluation of Outcomes: Progressing   LCSW Treatment Plan for Primary Diagnosis: Major depressive disorder, single episode, severe with psychotic features ([place]) Long Term Goal(s): Safe transition to appropriate next level of care at discharge, Engage patient in therapeutic group addressing interpersonal concerns.  Short Term Goals: Engage patient in aftercare planning with referrals and resources, Increase social support, Increase ability to appropriately verbalize feelings, Increase emotional regulation, Facilitate acceptance of mental health diagnosis and concerns, and Increase skills for wellness and recovery  Therapeutic Interventions: Assess for all discharge needs, 1 to 1 time with Social worker, Explore available resources and support systems, Assess for adequacy in community support network, Educate family and significant other(s) on suicide prevention, Complete Psychosocial Assessment, Interpersonal group therapy.  Evaluation of Outcomes: Progressing   Progress in Treatment: Attending groups: No. Participating in groups: No. Taking medication as prescribed: Yes. Toleration medication: Yes. Family/Significant other contact made: Yes, individual(s) contacted:  SPE completed with pt's husband [name] Patient understands diagnosis: Yes. Discussing patient identified problems/goals with staff: Yes. Medical problems stabilized or resolved: Yes. Denies suicidal/homicidal ideation: Yes. Issues/concerns per patient self-inventory: No. Other: None  New problem(s) identified: No, Describe:  None  New Short Term/Long Term Goal(s): Patient to work towards  elimination of symptoms of psychosis, medication management for mood stabilization; elimination of SI  thoughts; development of comprehensive mental wellness plan.   Patient Goals:  "want to feel back to myself"  Discharge Plan or Barriers: CSW will assist pt with development of appropriate discharge/aftercare plan.   Reason for Continuation of Hospitalization: Depression Hallucinations Medication stabilization  Estimated Length of Stay: 1-7 days  Last 3 [place] Suicide Severity Risk Score: Flowsheet Row Admission (Current) from 11/26/2021 in [place] ED to Hosp-Admission (Discharged) from 11/23/2021 in [facility] [place] ED to Hosp-Admission (Discharged) from 10/27/2021 in [place] No Risk No Risk No Risk       Last PHQ 2/9 Scores:    10/09/2021    4:55 PM  Depression screen PHQ 2/9  Decreased Interest   Down, Depressed, Hopeless   PHQ - 2 Score   Altered sleeping   Tired, decreased energy   Change in appetite   Feeling bad or failure about yourself    Trouble concentrating   Moving slowly or fidgety/restless   Suicidal thoughts   PHQ-9 Score      Information  is confidential and restricted. Go to Review Flowsheets to unlock data.    Scribe for Treatment Team: Dorothyann Mourer A [place], [name] 11/28/2021 10:49 AM

## 2021-11-28 NOTE — Plan of Care (Signed)
Pt endorses anxiety however, denies depression at this time. Pt denies SI/HI/AVH or pain at this time. Pt is calm and cooperative. Pt is medication compliant. Pt provided with support and encouragement. Pt monitored q15 minutes for safety per unit policy. Plan of care ongoing.   Pt reports Thursday night seeing fog in her room. Pt also shared that at home she once that she saw the silhouette of her dog in the kitchen. Pt reports she often thinks she sees animals or people but only their silhouettes.   Problem: Coping: Goal: Level of anxiety will decrease Outcome: Not Progressing   Problem: Elimination: Goal: Will not experience complications related to bowel motility Outcome: Not Progressing

## 2021-11-29 DIAGNOSIS — F323 Major depressive disorder, single episode, severe with psychotic features: Secondary | ICD-10-CM | POA: Diagnosis not present

## 2021-11-29 LAB — LIPID PANEL
Cholesterol: 155 mg/dL (ref 0–200)
HDL: 63 mg/dL (ref >40)
LDL Cholesterol: 69 mg/dL (ref 0–99)
Total CHOL/HDL Ratio: 2.5 RATIO
Triglycerides: 117 mg/dL (ref <150)
VLDL: 23 mg/dL (ref 0–40)

## 2021-11-29 MED ORDER — DOCUSATE SODIUM 100 MG PO CAPS
100.0000 mg | ORAL_CAPSULE | Freq: Every day | ORAL | Status: DC
  Administered 2021-11-29 – 2021-12-03 (×5): 100 mg via ORAL
  Filled 2021-11-29 (×5): qty 1

## 2021-11-29 NOTE — Plan of Care (Signed)
  Problem: Safety: Goal: Non-violent Restraint(s) Outcome: Progressing   Problem: Education: Goal: Knowledge of General Education information will improve Description: Including pain rating scale, medication(s)/side effects and non-pharmacologic comfort measures Outcome: Progressing   Problem: Coping: Goal: Level of anxiety will decrease Outcome: Progressing   Problem: Safety: Goal: Ability to remain free from injury will improve Outcome: Progressing

## 2021-11-29 NOTE — Plan of Care (Signed)
Pt endorses anxiety/depression at this time. Pt denies SI/HI/AVH or pain at this time. Pt is calm and cooperative. Pt is medication compliant. Pt provided with support and encouragement. Pt monitored q15 minutes for safety per unit policy. Plan of care ongoing.   Problem: Education: Goal: Knowledge of General Education information will improve Description: Including pain rating scale, medication(s)/side effects and non-pharmacologic comfort measures Outcome: Not Progressing   Problem: Coping: Goal: Level of anxiety will decrease Outcome: Not Progressing

## 2021-11-29 NOTE — Progress Notes (Signed)
1-1 progress note  0730 - Pt is in bedroom/bathroom getting ready for the day.  0830 - Pt is in the dayroom. Breakfast consumed. Vitals WDL.  0930 - Pt is in bedroom resting.   1030 - Pt is in bedroom resting. Respirations even/unlabored.  1130 - Pt is in the dayroom consuming lunch.  1230 - Pt is walking with walker in the hallway.  1330 - Pt is showing positive interaction with staff and peers.  1430 - Pt is in the bedroom interacting with sitter.  1530 - Pt is in the bedroom. No distress noted.  1630 - Pt is in the dayroom eating dinner.  1730 - Pt is in the bedroom.  1830 - Pt is in the dayroom interacting with staff.  Patient is A+O x3. She denies SI/HI/AVH. Mild anxiety noted but she denies depression. Patient is medication compliant and has a good appetite. Colace ordered daily due to increased constipation. She remains with a 1-1 sitter.

## 2021-11-29 NOTE — Progress Notes (Signed)
[facility] MD Progress Note  11/29/2021 12:50 PM Danielle [name]  MRN:  [id number] Subjective: Mood wise, patient remarks "I do not know."  Reports that she did not sleep well last night.  Reports no auditory hallucinations for the past 2 to days, mild and transient visual hallucinations persist.  Reports no bowel movement for the past 3 days despite MiraLAX.  Appetite is intact.  Offers no other information.  Denies symptoms of psychosis   Principal Problem: Major depressive disorder, single episode, severe with psychotic features ([place]) Diagnosis: Principal Problem:   Major depressive disorder, single episode, severe with psychotic features ([place])  Total Time spent with patient: 17 minutes  Past Psychiatric History: Quite extensive but I do not see any admissions even though she said that she has been admitted numerous times in the past mostly at [facility].  She recently was going to [facility] and seeing [name] but then [name] went on a vacation and she got frustrated and switched to [employer] [facility].  She has been on numerous medications in the past.  Past Medical History:  Past Medical History:  Diagnosis Date   Arthritis 02/13/2011   osteoarthritis, hips, knees,s/p Cervical fusion(DDD)   Atherosclerosis of aorta ([place]) 05/30/2015   on CTA chest   CAD (coronary artery disease)    a. 09/2016: Coronary CT showing mid-LAD plaque with 20-50% associated stenosis but no significant stenosis by FFR analysis.    Dementia ([place])    Depression with anxiety    Diabetes mellitus without complication ([place])    Difficult intubation 02/13/2011   with surgery -multiple times, no problems with recent surgeries   GERD (gastroesophageal reflux disease) 02/13/2011   tx. Omeprazole   Glaucoma 02/13/2011   bil. tx. eye drops daily   Headache(784.0) 02/13/2011   past hx. migraines, none recent   Heart murmur 02/13/2011   was told-no issues with this   Hypercholesterolemia    Hypertension  02/13/2011   Hypothyroidism 02/13/2011   tx. with Levothyroxine   Major depressive disorder    Mitral regurgitation 06/01/2015   Mild   Sinus drainage 02/13/2011   uses Zyrtec as needed   Sleep apnea 02/13/2011   Hx. sleep apnea-uses cpap since 10'12 nightly    Past Surgical History:  Procedure Laterality Date   ABDOMINAL HYSTERECTOMY  02/13/2011   APPENDECTOMY  02/13/2011   Lap. removal '92   BREAST CYST EXCISION Left    CERVICAL FUSION  02/13/2011   '92- retained hardware   CHOLECYSTECTOMY  02/13/2011   '98-lap. galbladder removal due to stones   GUM SURGERY  02/13/2011   "small mouth"   JOINT REPLACEMENT  02/13/2011   RTHA-a yr ago in [place] Left 08/15/2019   Procedure: EXCISION OF SEBACEOUS CYST LEFT BREAST;  Surgeon: Georganna Skeans, MD;  Location: [facility];  Service: General;  Laterality: Left;   TONSILLECTOMY  02/13/2011   child   TOTAL HIP ARTHROPLASTY  02/17/2011   Procedure: TOTAL HIP ARTHROPLASTY ANTERIOR APPROACH;  Surgeon: Mauri Pole, MD;  Location: [facility];  Service: Orthopedics;  Laterality: Left;   TUBAL LIGATION  02/13/2011   Family History:  Family History  Problem Relation Age of Onset   Cancer Mother        lung   Breast cancer Maternal Aunt    Alzheimer's disease Neg Hx    Dementia Neg Hx    Family Psychiatric  History: Unremarkable Social History:  Social History   Substance  and Sexual Activity  Alcohol Use Yes   Comment: rare     Social History   Substance and Sexual Activity  Drug Use No    Social History   Socioeconomic History   Marital status: Married    Spouse name: Not on file   Number of children: Not on file   Years of education: Not on file   Highest education level: Not on file  Occupational History   Occupation: Retired Not working  Tobacco Use   Smoking status: Never   Smokeless tobacco: Never  Vaping Use   Vaping Use: Never used  Substance and Sexual Activity   Alcohol  use: Yes    Comment: rare   Drug use: No   Sexual activity: Yes    Birth control/protection: Surgical    Comment: hyst  Other Topics Concern   Not on file  Social History Narrative   Caffeine le-ss then once cup daily. Education: HS diploma.  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     Social Determinants of Health   Financial Resource Strain: Not on file  Food Insecurity: No Food Insecurity (11/26/2021)   Hunger Vital Sign    Worried About Running Out of Food in the Last Year: Never true    Ran Out of Food in the Last Year: Never true  Transportation Needs: No Transportation Needs (11/26/2021)   PRAPARE - Hydrologist (Medical): No    Lack of Transportation (Non-Medical): No  Physical Activity: Not on file  Stress: Not on file  Social Connections: Not on file   Additional Social History:                         Sleep: Good  Appetite:  Good  Current Medications: Current Facility-Administered Medications  Medication Dose Route Frequency Provider Last Rate Last Admin   alum & mag hydroxide-simeth (MAALOX/MYLANTA) 200-200-20 MG/5ML suspension 30 mL  30 mL Oral Q4H PRN Parks Ranger, DO       FLUoxetine (PROZAC) capsule 20 mg  20 mg Oral Daily Parks Ranger, DO   20 mg at 11/29/21 0918   fluticasone (FLONASE) 50 MCG/ACT nasal spray 2 spray  2 spray Each Nare Daily Parks Ranger, DO   2 spray at 11/29/21 0916   furosemide (LASIX) tablet 10 mg  10 mg Oral Daily Parks Ranger, DO   10 mg at 11/29/21 1610   hydrocortisone cream 0.5 %   Topical TID PRN Parks Ranger, DO   Given at 11/27/21 2126   ibuprofen (ADVIL) tablet 600 mg  600 mg Oral Q6H PRN Caroline Sauger, NP   600 mg at 11/28/21 1305   levothyroxine (SYNTHROID) tablet 150 mcg  150 mcg Oral Q0600 Parks Ranger, DO   150 mcg at 11/29/21 0602   lidocaine (LIDODERM) 5 % 1 patch  1 patch Transdermal Q24H  Caroline Sauger, NP   1 patch at 11/29/21 0003   LORazepam (ATIVAN) tablet 1 mg  1 mg Oral Q4H PRN Parks Ranger, DO   1 mg at 11/29/21 9604   magnesium hydroxide (MILK OF MAGNESIA) suspension 30 mL  30 mL Oral Daily PRN Parks Ranger, DO   30 mL at 11/28/21 2021   metoprolol tartrate (LOPRESSOR) tablet 12.5 mg  12.5 mg Oral Q2000 Parks Ranger, DO   12.5 mg at 11/28/21 2021   mirtazapine (REMERON) tablet 15 mg  15 mg Oral QHS Parks Ranger, DO   15 mg at 11/28/21 2126   nitroGLYCERIN (NITROSTAT) SL tablet 0.4 mg  0.4 mg Sublingual Q5 min PRN Parks Ranger, DO       OLANZapine [facility]) tablet 10 mg  10 mg Oral QHS Parks Ranger, DO   10 mg at 11/28/21 2126   ondansetron (ZOFRAN) tablet 4 mg  4 mg Oral Q8H PRN Parks Ranger, DO   4 mg at 11/28/21 2306   pantoprazole (PROTONIX) EC tablet 40 mg  40 mg Oral Daily Parks Ranger, DO   40 mg at 11/29/21 0816   polyethylene glycol (MIRALAX / GLYCOLAX) packet 17 g  17 g Oral Daily Parks Ranger, DO   17 g at 11/29/21 8182   risperiDONE (RISPERDAL) tablet 1 mg  1 mg Oral Q6H PRN Parks Ranger, DO       rosuvastatin (CRESTOR) tablet 40 mg  40 mg Oral Daily Parks Ranger, DO   40 mg at 11/29/21 9937   traMADol (ULTRAM) tablet 50 mg  50 mg Oral Q6H PRN Parks Ranger, DO   50 mg at 11/28/21 1557   traZODone (DESYREL) tablet 100 mg  100 mg Oral QHS PRN Parks Ranger, DO        Lab Results:  Results for orders placed or performed during the hospital encounter of 11/26/21 (from the past 48 hour(s))  Lipid panel     Status: None   Collection Time: 11/29/21  5:33 AM  Result Value Ref Range   Cholesterol 155 0 - 200 mg/dL   Triglycerides 117 <150 mg/dL   HDL 63 >40 mg/dL   Total CHOL/HDL Ratio 2.5 RATIO   VLDL 23 0 - 40 mg/dL   LDL Cholesterol 69 0 - 99 mg/dL    Comment:        Total Cholesterol/HDL:CHD Risk Coronary Heart  Disease Risk Table                     Men   Women  1/2 Average Risk   3.4   3.3  Average Risk       5.0   4.4  2 X Average Risk   9.6   7.1  3 X Average Risk  23.4   11.0        Use the calculated Patient Ratio above and the CHD Risk Table to determine the patient's CHD Risk.        ATP III CLASSIFICATION (LDL):  <100     mg/dL   Optimal  100-129  mg/dL   Near or Above                    Optimal  130-159  mg/dL   Borderline  160-189  mg/dL   High  >190     mg/dL   Very High Performed at [facility], [address] [place], [place] [address]     Blood Alcohol level:  No results found for: "[facility]"  Metabolic Disorder Labs: Lab Results  Component Value Date   HGBA1C 5.7 (H) 10/28/2021   MPG 116.89 10/28/2021   MPG 114.02 10/08/2016   No results found for: "PROLACTIN" Lab Results  Component Value Date   CHOL 155 11/29/2021   TRIG 117 11/29/2021   HDL 63 11/29/2021   CHOLHDL 2.5 11/29/2021   VLDL 23 11/29/2021   LDLCALC 69 11/29/2021   LDLCALC 86 10/08/2016  Physical Findings: AIMS:  , ,  ,  ,    CIWA:    COWS:     Musculoskeletal: Strength & Muscle Tone: within normal limits Gait & Station: normal Patient leans: N/A  Psychiatric Specialty Exam:  Presentation  General Appearance:  Appropriate for Environment; Casual; Fairly Groomed  Eye Contact: Good  Speech: Clear and Coherent; Normal Rate  Speech Volume: Normal  Handedness: Right   Mood and Affect  Mood: Anxious  Affect: Appropriate; Congruent   Thought Process  Thought Processes: Coherent  Descriptions of Associations:Intact  Orientation:Full (Time, Place and Person)  Thought Content:Logical; Rumination  History of Schizophrenia/Schizoaffective disorder:No  Duration of Psychotic Symptoms:N/A  Hallucinations:No data recorded Ideas of Reference:None  Suicidal Thoughts:No data recorded Homicidal Thoughts:No data recorded  Sensorium  Memory: Immediate Fair;  Recent Fair; Remote Fair  Judgment: Fair  Insight: Fair   Community education officer  Concentration: Good  Attention Span: Good  Recall: [place] of Knowledge: Fair  Language: Good   Psychomotor Activity  Psychomotor Activity:No data recorded  Assets  Assets: Communication Skills; Desire for Improvement; Physical Health; Resilience   Poor sleep   Physical Exam: Physical Exam Vitals and nursing note reviewed.  Constitutional:      Appearance: Normal appearance. She is normal weight.  Neurological:     General: No focal deficit present.     Mental Status: She is alert and oriented to person, place, and time.  Psychiatric:        Attention and Perception: Attention and perception normal.        Mood and Affect: Mood is depressed. Affect is tearful.        Speech: Speech normal.        Behavior: Behavior normal. Behavior is cooperative.        Thought Content: Thought content normal.        Cognition and Memory: Memory normal. Cognition is impaired.        Judgment: Judgment is inappropriate.    Review of Systems  Constitutional: Negative.   HENT: Negative.    Eyes: Negative.   Respiratory: Negative.    Cardiovascular: Negative.   Gastrointestinal: Negative.   Genitourinary: Negative.   Musculoskeletal: Negative.   Skin: Negative.   Neurological: Negative.   Endo/Heme/Allergies: Negative.   Psychiatric/Behavioral:  Positive for depression. The patient is nervous/anxious.    Blood pressure 112/65, pulse 81, temperature 98.5 F (36.9 C), temperature source Oral, resp. rate 18, height '5\' 3"'$  (1.6 m), weight 86.2 kg, SpO2 96 %. Body mass index is 33.66 kg/m.   Treatment Plan Summary: Daily contact with patient to assess and evaluate symptoms and progress in treatment, Medication management, and Plan decrease Lasix to 10 mg and Lopressor to 12.5 mg.  Also, decrease Lasix to 10 mg.  Continue Zyprexa, Prozac, and Remeron.  11/18 Add Colace 100 mg p.o.  daily Will consider Senokot Monitor blood pressure, normotensive today   Rulon Sera, MD 11/29/2021, 12:50 PM

## 2021-11-30 ENCOUNTER — Other Ambulatory Visit: Payer: Self-pay | Admitting: Behavioral Health

## 2021-11-30 DIAGNOSIS — F410 Panic disorder [episodic paroxysmal anxiety] without agoraphobia: Secondary | ICD-10-CM

## 2021-11-30 DIAGNOSIS — F411 Generalized anxiety disorder: Secondary | ICD-10-CM

## 2021-11-30 DIAGNOSIS — F323 Major depressive disorder, single episode, severe with psychotic features: Secondary | ICD-10-CM | POA: Diagnosis not present

## 2021-11-30 DIAGNOSIS — F333 Major depressive disorder, recurrent, severe with psychotic symptoms: Secondary | ICD-10-CM

## 2021-11-30 MED ORDER — MELATONIN 5 MG PO TABS
2.5000 mg | ORAL_TABLET | Freq: Every day | ORAL | Status: DC
  Administered 2021-11-30 – 2021-12-01 (×2): 2.5 mg via ORAL
  Filled 2021-11-30 (×2): qty 1

## 2021-11-30 NOTE — Progress Notes (Signed)
Ok to hold metoprolol per Doren Custard, NP due to low b/p.

## 2021-11-30 NOTE — Progress Notes (Signed)
Patient is alert and oriented times 1. She is extremely confused. Mood and affect sad/sullen. Patient c/o pain in back. She denies SI, HI, and AVH. Also denies feelings of anxiety and depression at this time. States she slept good last night. Morning meds given whole by mouth W/O difficulty. Ate breakfast in day room- appetite good. Patient remains on unit with Q15 minute checks in place.

## 2021-11-30 NOTE — [facility] Suicide Risk Assessment (Signed)
[place] INPATIENT:  Family/Significant Other Suicide Prevention Education  Suicide Prevention Education:  Education Completed; RILYNNE LONSWAY, husband, [phone] has been identified by the patient as the family member/significant other with whom the patient will be residing, and identified as the person(s) who will aid the patient in the event of a mental health crisis (suicidal ideations/suicide attempt).  With written consent from the patient, the family member/significant other has been provided the following suicide prevention education, prior to the and/or following the discharge of the patient.  The suicide prevention education provided includes the following: Suicide risk factors Suicide prevention and interventions National Suicide Hotline telephone number [facility] assessment telephone number [facility] Emergency Assistance [place] and/or Residential Mobile Crisis Unit telephone number  Request made of family/significant other to: Remove weapons (e.g., guns, rifles, knives), all items previously/currently identified as safety concern.   Remove drugs/medications (over-the-counter, prescriptions, illicit drugs), all items previously/currently identified as a safety concern.  The family member/significant other verbalizes understanding of the suicide prevention education information provided.  The family member/significant other agrees to remove the items of safety concern listed above.  CSW spoke with the patient's husband.  He reports that patient's mental health began deteriorating in the summer.  He reports "I had gone to [facility] because they're making a movie about my unit in [place]".  He reports that patient did not want him to go and had an increase in anxiety around that time.  He reports that patient has struggled for depression "all her life".  He reports that during that time patient also began experiencing physical health problems and it was  determined that the patient had "chronic UTI".  He reports that patient has seen a provider at [facility] twice.  He reports no weapons in the home, and patient is not a danger to self or others.  He indicates a belief that patient was "overmedicated" prior to beginning with [facility] and this is driver to changes in mental health.  [name] 11/30/2021, 12:30 PM

## 2021-11-30 NOTE — Progress Notes (Addendum)
[facility] MD Progress Note  11/30/2021 11:00 AM Danielle [name]  MRN:  [id number] Subjective:  11/19 No changes patient slept suboptimally last night up for most of the night.  She thought it was better but still "not very good."  States that she normally sleeps well with the CPAP at home.  Reports sleep issues for the past 2 weeks.  Patient did take as needed trazodone 100 mg last night.  She does have word-finding difficulties as a and reports that she is feeling confused which makes her frustrated.  She feels "agitated" today but not in a violent way.  Depression is mild.  No thoughts of suicide.  Her husband came yesterday evening to visit her which went well.  States her husband struggles with PTSD.  She reports a decrease in visual hallucinations than before, but not able to engage them on a scale.  She reports seeing bugs on the walls, floors and ceilings and then after a few minutes "a new batch comes."  She is eating well.  No falls.  11/18 Mood wise, patient remarks "I do not know."  Reports that she did not sleep well last night.  Reports no auditory hallucinations for the past 2 to days, mild and transient visual hallucinations persist.  Reports no bowel movement for the past 3 days despite MiraLAX.  Appetite is intact.  Offers no other information.  Denies symptoms of psychosis  Principal Problem: Major depressive disorder, single episode, severe with psychotic features ([place]) Diagnosis: Principal Problem:   Major depressive disorder, single episode, severe with psychotic features ([place])  Total Time spent with patient: 17 minutes  Past Psychiatric History: Quite extensive but I do not see any admissions even though she said that she has been admitted numerous times in the past mostly at [facility].  She recently was going to [facility] and seeing [name] but then [name] went on a vacation and she got frustrated and switched to [employer] at [place].  She has been on numerous  medications in the past.  Past Medical History:  Past Medical History:  Diagnosis Date   Arthritis 02/13/2011   osteoarthritis, hips, knees,s/p Cervical fusion(DDD)   Atherosclerosis of aorta ([place]) 05/30/2015   on CTA chest   CAD (coronary artery disease)    a. 09/2016: Coronary CT showing mid-LAD plaque with 20-50% associated stenosis but no significant stenosis by FFR analysis.    Dementia ([place])    Depression with anxiety    Diabetes mellitus without complication ([place])    Difficult intubation 02/13/2011   with surgery -multiple times, no problems with recent surgeries   GERD (gastroesophageal reflux disease) 02/13/2011   tx. Omeprazole   Glaucoma 02/13/2011   bil. tx. eye drops daily   Headache(784.0) 02/13/2011   past hx. migraines, none recent   Heart murmur 02/13/2011   was told-no issues with this   Hypercholesterolemia    Hypertension 02/13/2011   Hypothyroidism 02/13/2011   tx. with Levothyroxine   Major depressive disorder    Mitral regurgitation 06/01/2015   Mild   Sinus drainage 02/13/2011   uses Zyrtec as needed   Sleep apnea 02/13/2011   Hx. sleep apnea-uses cpap since 10'12 nightly    Past Surgical History:  Procedure Laterality Date   ABDOMINAL HYSTERECTOMY  02/13/2011   APPENDECTOMY  02/13/2011   Lap. removal '92   BREAST CYST EXCISION Left    CERVICAL FUSION  02/13/2011   '92- retained hardware   CHOLECYSTECTOMY  02/13/2011   '98-lap.  galbladder removal due to stones   GUM SURGERY  02/13/2011   "small mouth"   JOINT REPLACEMENT  02/13/2011   RTHA-a yr ago in [place] Left 08/15/2019   Procedure: EXCISION OF SEBACEOUS CYST LEFT BREAST;  Surgeon: Georganna Skeans, MD;  Location: [facility];  Service: General;  Laterality: Left;   TONSILLECTOMY  02/13/2011   child   TOTAL HIP ARTHROPLASTY  02/17/2011   Procedure: TOTAL HIP ARTHROPLASTY ANTERIOR APPROACH;  Surgeon: Mauri Pole, MD;  Location: [facility];  Service:  Orthopedics;  Laterality: Left;   TUBAL LIGATION  02/13/2011   Family History:  Family History  Problem Relation Age of Onset   Cancer Mother        lung   Breast cancer Maternal Aunt    Alzheimer's disease Neg Hx    Dementia Neg Hx    Family Psychiatric  History: Unremarkable Social History:  Social History   Substance and Sexual Activity  Alcohol Use Yes   Comment: rare     Social History   Substance and Sexual Activity  Drug Use No    Social History   Socioeconomic History   Marital status: Married    Spouse name: Not on file   Number of children: Not on file   Years of education: Not on file   Highest education level: Not on file  Occupational History   Occupation: Retired Not working  Tobacco Use   Smoking status: Never   Smokeless tobacco: Never  Vaping Use   Vaping Use: Never used  Substance and Sexual Activity   Alcohol use: Yes    Comment: rare   Drug use: No   Sexual activity: Yes    Birth control/protection: Surgical    Comment: hyst  Other Topics Concern   Not on file  Social History Narrative   Caffeine le-ss then once cup daily. Education: HS diploma.  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     Social Determinants of Health   Financial Resource Strain: Not on file  Food Insecurity: No Food Insecurity (11/26/2021)   Hunger Vital Sign    Worried About Running Out of Food in the Last Year: Never true    Ran Out of Food in the Last Year: Never true  Transportation Needs: No Transportation Needs (11/26/2021)   PRAPARE - Hydrologist (Medical): No    Lack of Transportation (Non-Medical): No  Physical Activity: Not on file  Stress: Not on file  Social Connections: Not on file   Additional Social History:                         Sleep: Good  Appetite:  Good  Current Medications: Current Facility-Administered Medications  Medication Dose Route Frequency Provider Last Rate Last  Admin   alum & mag hydroxide-simeth (MAALOX/MYLANTA) 200-200-20 MG/5ML suspension 30 mL  30 mL Oral Q4H PRN Parks Ranger, DO       docusate sodium (COLACE) capsule 100 mg  100 mg Oral Daily Rulon Sera, MD   100 mg at 11/30/21 0924   FLUoxetine (PROZAC) capsule 20 mg  20 mg Oral Daily Parks Ranger, DO   20 mg at 11/30/21 0924   fluticasone (FLONASE) 50 MCG/ACT nasal spray 2 spray  2 spray Each Nare Daily Parks Ranger, DO   2 spray at 11/30/21 0923   furosemide (LASIX) tablet 10 mg  10 mg Oral Daily Parks Ranger, DO   10 mg at 11/30/21 8416   hydrocortisone cream 0.5 %   Topical TID PRN Parks Ranger, DO   Given at 11/27/21 2126   ibuprofen (ADVIL) tablet 600 mg  600 mg Oral Q6H PRN Caroline Sauger, NP   600 mg at 11/30/21 6063   levothyroxine (SYNTHROID) tablet 150 mcg  150 mcg Oral Q0600 Parks Ranger, DO   150 mcg at 11/30/21 0552   lidocaine (LIDODERM) 5 % 1 patch  1 patch Transdermal Q24H Caroline Sauger, NP   1 patch at 11/29/21 2349   LORazepam (ATIVAN) tablet 1 mg  1 mg Oral Q4H PRN Parks Ranger, DO   1 mg at 11/30/21 0160   magnesium hydroxide (MILK OF MAGNESIA) suspension 30 mL  30 mL Oral Daily PRN Parks Ranger, DO   30 mL at 11/28/21 2021   metoprolol tartrate (LOPRESSOR) tablet 12.5 mg  12.5 mg Oral Q2000 Parks Ranger, DO   12.5 mg at 11/29/21 2051   mirtazapine (REMERON) tablet 15 mg  15 mg Oral QHS Parks Ranger, DO   15 mg at 11/29/21 2108   nitroGLYCERIN (NITROSTAT) SL tablet 0.4 mg  0.4 mg Sublingual Q5 min PRN Parks Ranger, DO       OLANZapine [facility]) tablet 10 mg  10 mg Oral QHS Parks Ranger, DO   10 mg at 11/29/21 2108   ondansetron (ZOFRAN) tablet 4 mg  4 mg Oral Q8H PRN Parks Ranger, DO   4 mg at 11/30/21 0925   pantoprazole (PROTONIX) EC tablet 40 mg  40 mg Oral Daily Parks Ranger, DO   40 mg at 11/30/21 1093    polyethylene glycol (MIRALAX / GLYCOLAX) packet 17 g  17 g Oral Daily Parks Ranger, DO   17 g at 11/30/21 2355   risperiDONE (RISPERDAL) tablet 1 mg  1 mg Oral Q6H PRN Parks Ranger, DO       rosuvastatin (CRESTOR) tablet 40 mg  40 mg Oral Daily Parks Ranger, DO   40 mg at 11/30/21 7322   traMADol (ULTRAM) tablet 50 mg  50 mg Oral Q6H PRN Parks Ranger, DO   50 mg at 11/30/21 0254   traZODone (DESYREL) tablet 100 mg  100 mg Oral QHS PRN Parks Ranger, DO   100 mg at 11/29/21 2108    Lab Results:  Results for orders placed or performed during the hospital encounter of 11/26/21 (from the past 48 hour(s))  Lipid panel     Status: None   Collection Time: 11/29/21  5:33 AM  Result Value Ref Range   Cholesterol 155 0 - 200 mg/dL   Triglycerides 117 <150 mg/dL   HDL 63 >40 mg/dL   Total CHOL/HDL Ratio 2.5 RATIO   VLDL 23 0 - 40 mg/dL   LDL Cholesterol 69 0 - 99 mg/dL    Comment:        Total Cholesterol/HDL:CHD Risk Coronary Heart Disease Risk Table                     Men   Women  1/2 Average Risk   3.4   3.3  Average Risk       5.0   4.4  2 X Average Risk   9.6   7.1  3 X Average Risk  23.4   11.0        Use  the calculated Patient Ratio above and the CHD Risk Table to determine the patient's CHD Risk.        ATP III CLASSIFICATION (LDL):  <100     mg/dL   Optimal  100-129  mg/dL   Near or Above                    Optimal  130-159  mg/dL   Borderline  160-189  mg/dL   High  >190     mg/dL   Very High Performed at [facility], [address] [place], [place] [address]     Blood Alcohol level:  No results found for: "[facility]"  Metabolic Disorder Labs: Lab Results  Component Value Date   HGBA1C 5.7 (H) 10/28/2021   MPG 116.89 10/28/2021   MPG 114.02 10/08/2016   No results found for: "PROLACTIN" Lab Results  Component Value Date   CHOL 155 11/29/2021   TRIG 117 11/29/2021   HDL 63 11/29/2021   CHOLHDL 2.5  11/29/2021   VLDL 23 11/29/2021   LDLCALC 69 11/29/2021   LDLCALC 86 10/08/2016    Physical Findings: AIMS:  , ,  ,  ,    CIWA:    COWS:     Musculoskeletal: Strength & Muscle Tone: within normal limits Gait & Station: normal Patient leans: N/A  Psychiatric Specialty Exam:  Presentation  General Appearance:  Appropriate for Environment; Casual; Fairly Groomed  Eye Contact: Good  Speech: Clear and Coherent; Normal Rate  Speech Volume: Normal  Handedness: Right   Mood and Affect  Mood: frustrated  Affect: Appropriate; Congruent   Thought Process  Thought Processes: Coherent  Descriptions of Associations:Intact  Orientation:Full (Time, Place and Person)  Thought Content:Logical; Rumination  History of Schizophrenia/Schizoaffective disorder:No  Visual hallucinations No suicidal or homicidal thoughts.   Sensorium  Memory: Immediate Fair; Recent Fair; Remote Fair  Judgment: Fair  Insight: Fair   Executive Functions  Concentration: Good  Attention Span: Good  Recall: [place] of Knowledge: Fair  Language: Good   Psychomotor Activity  Psychomotor Activity:No data recorded  Assets  Assets: Communication Skills; Desire for Improvement; Physical Health; Resilience   Poor sleep   Physical Exam: Physical Exam Vitals and nursing note reviewed.  Constitutional:      Appearance: Normal appearance. She is normal weight.  Neurological:     General: No focal deficit present.     Mental Status: She is alert and oriented to person, place, and time.  Psychiatric:        Attention and Perception: Attention and perception normal.        Mood and Affect: Mood is depressed. Affect is tearful.        Speech: Speech normal.        Behavior: Behavior normal. Behavior is cooperative.        Thought Content: Thought content normal.        Cognition and Memory: Memory normal. Cognition is impaired.        Judgment: Judgment is  inappropriate.    Review of Systems  Constitutional: Negative.   HENT: Negative.    Eyes: Negative.   Respiratory: Negative.    Cardiovascular: Negative.   Gastrointestinal: Negative.   Genitourinary: Negative.   Musculoskeletal: Negative.   Skin: Negative.   Neurological: Negative.   Endo/Heme/Allergies: Negative.   Psychiatric/Behavioral:  Positive for depression. The patient is nervous/anxious.    Blood pressure (!) 101/55, pulse 78, temperature (!) 97.5 F (36.4 C), temperature  source Oral, resp. rate 15, height '5\' 3"'$  (1.6 m), weight 86.2 kg, SpO2 (!) 86 %. Body mass index is 33.66 kg/m.   Treatment Plan Summary: Daily contact with patient to assess and evaluate symptoms and progress in treatment, Medication management, and Plan decrease Lasix to 10 mg and Lopressor to 12.5 mg.  Also, decrease Lasix to 10 mg.  Continue Zyprexa, Prozac, and Remeron.  11/18 Add Colace 100 mg p.o. daily Will consider Senokot Monitor blood pressure, normotensive today  11/19 Add melatonin 3 mg p.o. nightly   Rulon Sera, MD 11/30/2021, 11:00 AM

## 2021-11-30 NOTE — BHH Suicide Risk Assessment (Signed)
[place] INPATIENT:  Family/Significant Other Suicide Prevention Education  Suicide Prevention Education:  Contact Attempts: Danielle [name], husband, [phone] has been identified by the patient as the family member/significant other with whom the patient will be residing, and identified as the person(s) who will aid the patient in the event of a mental health crisis.  With written consent from the patient, two attempts were made to provide suicide prevention education, prior to and/or following the patient's discharge.  We were unsuccessful in providing suicide prevention education.  A suicide education pamphlet was given to the patient to share with family/significant other.  Date and time of first attempt: 11/30/2021 11:53 AM Date and time of second attempt:Second attempt is needed.  CSW spoke briefly with the patient's husband who reports that he is in a restaurant and will call this CSW back.  Danielle [name] 11/30/2021, 11:52 AM

## 2021-11-30 NOTE — Progress Notes (Signed)
Physical Therapy Treatment Patient Details Name: Danielle [name] MRN: [id number] DOB: 1945/01/19 Today's Date: 11/30/2021   History of Present Illness Patient is a 76 year old white female who was voluntarily admitted on transfer from [facility] with a diagnosis of pseudodementia.  Long history of depression and recent memory problems. Initially presented to the hospital on 11/12 with SOB, LE swelling, worsening panic attacks and recent admission prior to that with falls, lumbar compression Fx with d/c recommended for SNF at that time. PT asked to see for numerus falls recently    PT Comments    Just walked from day room with RN.  She is able to stand with min/mod a x 1 from lower bed height and do standing ex x 10 with walker.  Mod cues and assist for walker position.  She  rests in chair in room.  Struggles to turn and for hand placements.  She generally had trouble processing cues for exercises and activity and is assisted back to bed.  When asked to reach for chair she begins tapping her bottom with both hands.  Generally unusual behavior at times.  Assisted to sitting and mod a x 1 to return to supine.  Discussed with RN who stated behaviors are normal "comes and goes".  Recommend +1 assist at all times at home.   Recommendations for follow up therapy are one component of a multi-disciplinary discharge planning process, led by the attending physician.  Recommendations may be updated based on patient status, additional functional criteria and insurance authorization.  Follow Up Recommendations  Home health PT     Assistance Recommended at Discharge Frequent or constant Supervision/Assistance  Patient can return home with the following A little help with walking and/or transfers;A little help with bathing/dressing/bathroom;Assistance with cooking/housework;Direct supervision/assist for medications management;Assist for transportation;Help with stairs or ramp for entrance   Equipment  Recommendations  None recommended by PT    Recommendations for Other Services       Precautions / Restrictions Precautions Precautions: Fall Restrictions Weight Bearing Restrictions: No     Mobility  Bed Mobility Overal bed mobility: Needs Assistance Bed Mobility: Sit to Supine       Sit to supine: Mod assist        Transfers Overall transfer level: Needs assistance Equipment used: Rolling walker (2 wheels) Transfers: Sit to/from Stand Sit to Stand: Min assist           General transfer comment: max cues for hand placements and increased assist from lower surfaces    Ambulation/Gait Ambulation/Gait assistance: Min guard, Min assist Gait Distance (Feet): 10 Feet Assistive device: Rolling walker (2 wheels) Gait Pattern/deviations: Step-to pattern, Decreased step length - right, Decreased step length - left, Trunk flexed Gait velocity: decreased         Stairs             Wheelchair Mobility    Modified Rankin (Stroke Patients Only)       Balance Overall balance assessment: Needs assistance Sitting-balance support: Feet supported Sitting balance-Leahy Scale: Fair     Standing balance support: No upper extremity supported, During functional activity Standing balance-Leahy Scale: Fair                              Cognition Arousal/Alertness: Awake/alert Behavior During Therapy: Flat affect, Anxious Overall Cognitive Status: History of cognitive impairments - at baseline  Exercises Other Exercises Other Exercises: standing AROM x 10    General Comments        Pertinent Vitals/Pain Pain Assessment Pain Assessment: No/denies pain    Home Living                          Prior Function            PT Goals (current goals can now be found in the care plan section) Progress towards PT goals: Progressing toward goals    Frequency    Min  2X/week      PT Plan      Co-evaluation              AM-PAC PT "6 Clicks" Mobility   Outcome Measure  Help needed turning from your back to your side while in a flat bed without using bedrails?: None Help needed moving from lying on your back to sitting on the side of a flat bed without using bedrails?: A Lot Help needed moving to and from a bed to a chair (including a wheelchair)?: A Little Help needed standing up from a chair using your arms (e.g., wheelchair or bedside chair)?: A Little Help needed to walk in hospital room?: A Little Help needed climbing 3-5 steps with a railing? : A Little 6 Click Score: 18    End of Session   Activity Tolerance: Patient limited by fatigue Patient left: in bed;with call bell/phone within reach Nurse Communication: Mobility status PT Visit Diagnosis: Unsteadiness on feet (R26.81);Muscle weakness (generalized) (M62.81)     Time: 0350-0938 PT Time Calculation (min) (ACUTE ONLY): 10 min  Charges:  $Therapeutic Exercise: 8-22 mins                   Chesley Noon, PTA 11/30/21, 1:13 PM

## 2021-11-30 NOTE — Progress Notes (Signed)
1:1 Hourly rounding  2300: Pt in bedroom sleeping. Raise and fall of chx observed. Pt's breathing is unlabored and regular. This writer is sitting with pt.  0000: Pt is calm and composed in bedroom with sitter present.   0100: Pt is in bedroom anxious with sitter present. Prn medication given.  0200: Pt is calm and composed in bedroom with sitter present.   0300: Pt in bedroom sleeping. Raise and fall of chx observed. Pt's breathing is unlabored and regular. This writer is sitting with pt.  0400: Pt in bedroom sleeping. Raise and fall of chx observed. Pt's breathing is unlabored and regular. This writer is sitting with pt.  0500: Pt is calm and composed in bedroom with sitter present.   0600: Pt is calm and composed in bedroom with sitter present.  0700: Pt is calm and composed in the dayroom with sitter present.

## 2021-11-30 NOTE — Plan of Care (Signed)
  Problem: Safety: Goal: Non-violent Restraint(s) Outcome: Progressing   Problem: Coping: Goal: Level of anxiety will decrease Outcome: Progressing   Problem: Safety: Goal: Ability to remain free from injury will improve Outcome: Progressing   Problem: Education: Goal: Emotional status will improve Outcome: Progressing Goal: Mental status will improve Outcome: Progressing

## 2021-11-30 NOTE — Group Note (Signed)
LCSW Group Therapy Note  Group Date: 11/30/2021 Start Time: 1400 End Time: 1415   Type of Therapy and Topic:  Group Therapy: Positive Affirmations  Participation Level:  Did Not Attend   Description of Group:   This group addressed positive affirmation towards self and others.  Patients went around the room and identified two positive things about themselves and two positive things about a peer in the room.  Patients reflected on how it felt to share something positive with others, to identify positive things about themselves, and to hear positive things from others/ Patients were encouraged to have a daily reflection of positive characteristics or circumstances.   Therapeutic Goals: Patients will verbalize two of their positive qualities Patients will demonstrate empathy for others by stating two positive qualities about a peer in the group Patients will verbalize their feelings when voicing positive self affirmations and when voicing positive affirmations of others Patients will discuss the potential positive impact on their wellness/recovery of focusing on positive traits of self and others.  Summary of Patient Progress:   Patient was asleep at group time.    Therapeutic Modalities:   Cognitive Behavioral Therapy Motivational Interviewing    [name], [place] 11/30/2021  2:08 PM

## 2021-11-30 NOTE — Progress Notes (Signed)
1:1 Note   0745 - Pt in room with sitter.  0845 - Pt in bedroom in bed talking with sitter.  0945 - Pt in bed with sitter at bedside. NAD. 75 - Pt lin hallway with sitter NAD.  1145 - Pt in dayroom with sitter.  1245 - Pt in room asleep. 1345 - Pt laying in bed resting with eyes closed. NAD. Sitter at bedside. Fisher in bed resting with eyes closed and snoring. NAD. Sitter at bedside. 1545 - Pt in dayroom with sitter and peers.  1645 - Pt in hallway with sitter. 1715 - Pt laying in bed resting with eyes closed. NAD. Sitter at bedside. 89 - Pt in dayroom with sitter and visitor. NAD.

## 2021-12-01 ENCOUNTER — Ambulatory Visit: Payer: Medicare HMO | Admitting: Behavioral Health

## 2021-12-01 DIAGNOSIS — F323 Major depressive disorder, single episode, severe with psychotic features: Secondary | ICD-10-CM | POA: Diagnosis not present

## 2021-12-01 MED ORDER — HYDROXYZINE HCL 25 MG PO TABS
25.0000 mg | ORAL_TABLET | Freq: Four times a day (QID) | ORAL | Status: DC | PRN
  Administered 2021-12-01 – 2021-12-02 (×2): 25 mg via ORAL
  Filled 2021-12-01 (×2): qty 1

## 2021-12-01 NOTE — Progress Notes (Signed)
In bed resting, eyes closed, sitter at bedside for safety.

## 2021-12-01 NOTE — Progress Notes (Signed)
Patient in bed, resting eyes closed in no distress. Remains 1:1 with sitter for safety.

## 2021-12-01 NOTE — Progress Notes (Signed)
In bed resting, eyes closed in no distress. Remains with sitter at bedside.

## 2021-12-01 NOTE — Progress Notes (Signed)
At nurses station, visitor recently left. Remains 1:1 with sitter for safety.

## 2021-12-01 NOTE — BHH Counselor (Signed)
CSW contacted pt's husband, Toshie Demelo, [phone] ,regarding scheduled discharge for this Wednesday. He stated that would be fine and that he would provide transportation for pt between 12-2pm that day.   Provider recommends home health and per pt, she stated that she has someone come to her house already for OT/PT.   CSW followed up with husband regarding pt's statement and he stated that pt does have PT/OT in place and will contact the company to tell them know pt is back and home and schedule services.   No other requests were made. Conversation ended without incident.   Sofia Vanmeter [place], MSW, LCSW-A 11/20/20233:48 PM

## 2021-12-01 NOTE — Plan of Care (Signed)
D- Patient alert and oriented. Patient in a pleasant mood and affect. Patient was a little drowsy after morning medication. MD made aware of increased sleepiness. Patient denies SI, HI, AVH, but endorses pain and anxiety. Pain rated as an 8 on a scale from 0-10. Patient declined tramadol and PRN ativan due to risk of increased tiredness. MD made aware and new order for hydroxyzine.   A- Scheduled medications administered to patient, per MD orders. Support and encouragement provided.  Routine safety checks conducted every 15 minutes.  Patient informed to notify staff with problems or concerns.  R- No adverse drug reactions noted. Patient contracts for safety at this time. Patient compliant with medications and treatment plan. Patient receptive, calm, and cooperative. Patient interacts well with others on the unit when out of her room.  Patient remains safe at this time.   Problem: Health Behavior/Discharge Planning: Goal: Ability to manage health-related needs will improve Outcome: Progressing   Problem: Activity: Goal: Risk for activity intolerance will decrease Outcome: Progressing   Problem: Nutrition: Goal: Adequate nutrition will be maintained Outcome: Progressing   Problem: Pain Managment: Goal: General experience of comfort will improve Outcome: Progressing   Problem: Education: Goal: Emotional status will improve Outcome: Progressing Goal: Mental status will improve Outcome: Progressing   Problem: Activity: Goal: Interest or engagement in activities will improve Outcome: Progressing

## 2021-12-01 NOTE — [facility] Group Notes (Signed)
[facility] Group Notes:  (Nursing/MHT/Case Management/Adjunct)  Date:  12/01/2021  Time:  4:56 PM  Type of Therapy:   Coping Skills  Participation Level:  Did Not Attend  Participation Quality:   Did Not Attend  Affect:   Did Not Attend  Cognitive:   Did Not Attend  Insight:  None- Did Not Attend  Engagement in Group:   Did Not Attend  Modes of Intervention:  Activity, Discussion, and Support  Summary of Progress/Problems:  interactive group coping session identifying personal coping skills.   Pennelope Bracken, RN 12/01/2021, 4:56 PM

## 2021-12-01 NOTE — Progress Notes (Signed)
Patient continues with 1:1 sitter for safety. Patient is alert and orient to self. Complaints of pain to back, prn given. Patient currently in bed resting eyes closed.  Medications given as prescribed. Prn for sleep given with good relief. Encouragement and support provided. Safety checks maintained. Medications given as prescribed. Pt receptive and remains safe on unit with q 15 min checks.

## 2021-12-01 NOTE — Progress Notes (Signed)
7:00 am- in room sleeping  8:00 am- in room sleeping 9:00 am-in room sleeping 10:00 am- in room sleeping 10:16 am- shower  10:30 am- in the dayroom 11:00 am- in room sleeping 12:00 pm- in room sleeping, did not participate in group 1:00 pm-in room sleeping 2:00 pm- in room sleeping 3:00 pm- in dayroom watching a movie 4:00 pm-in room sleeping 4:20 pm- dayroom for dinner 5:00 pm- dayroom 5:15 pm-in room sleeping 6:00 pm- in the dayroom watching movie  6:38 pm- in dayroom with visitor (son)

## 2021-12-01 NOTE — Progress Notes (Signed)
[facility] MD Progress Note  12/01/2021 12:42 PM Danielle [name]  MRN:  [id number] Subjective: Danielle [name] is seen on rounds.  She had some trouble sleeping last night and required some as needed medications.  She has been compliant with medications and the only side effect she complains about is feeling dizzy when she takes her medicine.  She has been on these medications for almost a week now.  Blood pressure has been a problem but it improved.  Occupational therapy made recommendations for home health.  She tends to lose her train of thought during conversation but her cognition has improved and she answers questions appropriately but loses her train of thought. OT assessment is below:  Pt completed the SLUMS - a 30-point screening questionnaire that tests orientation, memory, attention, problem solving, and executive function. Pt scored a 10/30 indicating Dementia and the need for further assessment. Pt with noted impairments in working memory and recognizing errors limiting ability to participate functionally in ADLs. Pt making good progress toward goals, will continue to follow POC. Discharge recommendation remains appropriate.    Principal Problem: Major depressive disorder, single episode, severe with psychotic features ([place]) Diagnosis: Principal Problem:   Major depressive disorder, single episode, severe with psychotic features ([place])  Total Time spent with patient: 15 minutes  Past Psychiatric History: Quite extensive but I do not see any admissions even though she said that she has been admitted numerous times in the past mostly at [facility].  She recently was going to [facility] and seeing [name] but then [name] went on a vacation and she got frustrated and switched to [employer] at [place].  She has been on numerous medications in the past.   Past Medical History:  Past Medical History:  Diagnosis Date   Arthritis 02/13/2011   osteoarthritis, hips, knees,s/p Cervical fusion(DDD)    Atherosclerosis of aorta ([place]) 05/30/2015   on CTA chest   CAD (coronary artery disease)    a. 09/2016: Coronary CT showing mid-LAD plaque with 20-50% associated stenosis but no significant stenosis by FFR analysis.    Dementia ([place])    Depression with anxiety    Diabetes mellitus without complication ([place])    Difficult intubation 02/13/2011   with surgery -multiple times, no problems with recent surgeries   GERD (gastroesophageal reflux disease) 02/13/2011   tx. Omeprazole   Glaucoma 02/13/2011   bil. tx. eye drops daily   Headache(784.0) 02/13/2011   past hx. migraines, none recent   Heart murmur 02/13/2011   was told-no issues with this   Hypercholesterolemia    Hypertension 02/13/2011   Hypothyroidism 02/13/2011   tx. with Levothyroxine   Major depressive disorder    Mitral regurgitation 06/01/2015   Mild   Sinus drainage 02/13/2011   uses Zyrtec as needed   Sleep apnea 02/13/2011   Hx. sleep apnea-uses cpap since 10'12 nightly    Past Surgical History:  Procedure Laterality Date   ABDOMINAL HYSTERECTOMY  02/13/2011   APPENDECTOMY  02/13/2011   Lap. removal '92   BREAST CYST EXCISION Left    CERVICAL FUSION  02/13/2011   '92- retained hardware   CHOLECYSTECTOMY  02/13/2011   '98-lap. galbladder removal due to stones   GUM SURGERY  02/13/2011   "small mouth"   JOINT REPLACEMENT  02/13/2011   RTHA-a yr ago in [place] Left 08/15/2019   Procedure: EXCISION OF SEBACEOUS CYST LEFT BREAST;  Surgeon: Georganna Skeans, MD;  Location: [place];  Service: General;  Laterality: Left;   TONSILLECTOMY  02/13/2011   child   TOTAL HIP ARTHROPLASTY  02/17/2011   Procedure: TOTAL HIP ARTHROPLASTY ANTERIOR APPROACH;  Surgeon: Mauri Pole, MD;  Location: [facility];  Service: Orthopedics;  Laterality: Left;   TUBAL LIGATION  02/13/2011   Family History:  Family History  Problem Relation Age of Onset   Cancer Mother        lung   Breast cancer  Maternal Aunt    Alzheimer's disease Neg Hx    Dementia Neg Hx    Family Psychiatric  History: Unremarkable Social History:  Social History   Substance and Sexual Activity  Alcohol Use Yes   Comment: rare     Social History   Substance and Sexual Activity  Drug Use No    Social History   Socioeconomic History   Marital status: Married    Spouse name: Not on file   Number of children: Not on file   Years of education: Not on file   Highest education level: Not on file  Occupational History   Occupation: Retired Not working  Tobacco Use   Smoking status: Never   Smokeless tobacco: Never  Vaping Use   Vaping Use: Never used  Substance and Sexual Activity   Alcohol use: Yes    Comment: rare   Drug use: No   Sexual activity: Yes    Birth control/protection: Surgical    Comment: hyst  Other Topics Concern   Not on file  Social History Narrative   Caffeine le-ss then once cup daily. Education: HS diploma.  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     Social Determinants of Health   Financial Resource Strain: Not on file  Food Insecurity: No Food Insecurity (11/26/2021)   Hunger Vital Sign    Worried About Running Out of Food in the Last Year: Never true    Ran Out of Food in the Last Year: Never true  Transportation Needs: No Transportation Needs (11/26/2021)   PRAPARE - Hydrologist (Medical): No    Lack of Transportation (Non-Medical): No  Physical Activity: Not on file  Stress: Not on file  Social Connections: Not on file   Additional Social History:                         Sleep: Fair  Appetite:  Fair  Current Medications: Current Facility-Administered Medications  Medication Dose Route Frequency Provider Last Rate Last Admin   alum & mag hydroxide-simeth (MAALOX/MYLANTA) 200-200-20 MG/5ML suspension 30 mL  30 mL Oral Q4H PRN Parks Ranger, DO       docusate sodium (COLACE) capsule 100  mg  100 mg Oral Daily Rulon Sera, MD   100 mg at 12/01/21 1000   FLUoxetine (PROZAC) capsule 20 mg  20 mg Oral Daily Parks Ranger, DO   20 mg at 12/01/21 1000   fluticasone (FLONASE) 50 MCG/ACT nasal spray 2 spray  2 spray Each Nare Daily Parks Ranger, DO   2 spray at 12/01/21 1003   furosemide (LASIX) tablet 10 mg  10 mg Oral Daily Parks Ranger, DO   10 mg at 12/01/21 1001   hydrocortisone cream 0.5 %   Topical TID PRN Parks Ranger, DO   Given at 11/27/21 2126   ibuprofen (ADVIL) tablet 600 mg  600 mg Oral Q6H PRN Caroline Sauger, NP   600 mg at  12/01/21 1214   levothyroxine (SYNTHROID) tablet 150 mcg  150 mcg Oral Q0600 Parks Ranger, DO   150 mcg at 12/01/21 5400   lidocaine (LIDODERM) 5 % 1 patch  1 patch Transdermal Q24H Caroline Sauger, NP   1 patch at 12/01/21 0410   LORazepam (ATIVAN) tablet 1 mg  1 mg Oral Q4H PRN Parks Ranger, DO   1 mg at 12/01/21 0410   magnesium hydroxide (MILK OF MAGNESIA) suspension 30 mL  30 mL Oral Daily PRN Parks Ranger, DO   30 mL at 11/28/21 2021   melatonin tablet 2.5 mg  2.5 mg Oral QHS Rulon Sera, MD   2.5 mg at 11/30/21 2128   metoprolol tartrate (LOPRESSOR) tablet 12.5 mg  12.5 mg Oral Q2000 Parks Ranger, DO   12.5 mg at 11/29/21 2051   mirtazapine (REMERON) tablet 15 mg  15 mg Oral QHS Parks Ranger, DO   15 mg at 11/30/21 2008   nitroGLYCERIN (NITROSTAT) SL tablet 0.4 mg  0.4 mg Sublingual Q5 min PRN Parks Ranger, DO       OLANZapine (ZYPREXA) tablet 10 mg  10 mg Oral QHS Parks Ranger, DO   10 mg at 11/30/21 2009   ondansetron (ZOFRAN) tablet 4 mg  4 mg Oral Q8H PRN Parks Ranger, DO   4 mg at 11/30/21 0925   pantoprazole (PROTONIX) EC tablet 40 mg  40 mg Oral Daily Parks Ranger, DO   40 mg at 12/01/21 1001   polyethylene glycol (MIRALAX / [place]) packet 17 g  17 g Oral Daily Parks Ranger, DO    17 g at 12/01/21 1000   risperiDONE (RISPERDAL) tablet 1 mg  1 mg Oral Q6H PRN Parks Ranger, DO       rosuvastatin (CRESTOR) tablet 40 mg  40 mg Oral Daily Parks Ranger, DO   40 mg at 12/01/21 1000   traMADol (ULTRAM) tablet 50 mg  50 mg Oral Q6H PRN Parks Ranger, DO   50 mg at 12/01/21 0410   traZODone (DESYREL) tablet 100 mg  100 mg Oral QHS PRN Parks Ranger, DO   100 mg at 11/30/21 2008    Lab Results: No results found for this or any previous visit (from the past 48 hour(s)).  Blood Alcohol level:  No results found for: "ETH"  Metabolic Disorder Labs: Lab Results  Component Value Date   HGBA1C 5.7 (H) 10/28/2021   MPG 116.89 10/28/2021   MPG 114.02 10/08/2016   No results found for: "PROLACTIN" Lab Results  Component Value Date   CHOL 155 11/29/2021   TRIG 117 11/29/2021   HDL 63 11/29/2021   CHOLHDL 2.5 11/29/2021   VLDL 23 11/29/2021   LDLCALC 69 11/29/2021   LDLCALC 86 10/08/2016    Physical Findings: AIMS:  , ,  ,  ,    CIWA:    COWS:     Musculoskeletal: Strength & Muscle Tone: within normal limits Gait & Station: normal Patient leans: N/A  Psychiatric Specialty Exam:  Presentation  General Appearance:  Appropriate for Environment; Casual; Fairly Groomed  Eye Contact: Good  Speech: Clear and Coherent; Normal Rate  Speech Volume: Normal  Handedness: Right   Mood and Affect  Mood: Anxious  Affect: Appropriate; Congruent   Thought Process  Thought Processes: Coherent  Descriptions of Associations:Intact  Orientation:Full (Time, Place and Person)  Thought Content:Logical; Rumination  History of Schizophrenia/Schizoaffective disorder:No  Duration of Psychotic  Symptoms:N/A  Hallucinations:No data recorded Ideas of Reference:None  Suicidal Thoughts:No data recorded Homicidal Thoughts:No data recorded  Sensorium  Memory: Immediate Fair; Recent Fair; Remote  Fair  Judgment: Fair  Insight: Fair   Community education officer  Concentration: Good  Attention Span: Good  Recall: [place] of Knowledge: Fair  Language: Good   Psychomotor Activity  Psychomotor Activity:No data recorded  Assets  Assets: Communication Skills; Desire for Improvement; Physical Health; Resilience   Sleep  Sleep:No data recorded   Physical Exam: Physical Exam Vitals and nursing note reviewed.  Constitutional:      Appearance: Normal appearance. She is normal weight.  Neurological:     General: No focal deficit present.     Mental Status: She is alert and oriented to person, place, and time.  Psychiatric:        Attention and Perception: Attention and perception normal.        Mood and Affect: Mood and affect normal.        Speech: Speech normal.        Behavior: Behavior normal. Behavior is cooperative.        Thought Content: Thought content normal.        Cognition and Memory: Memory normal. Cognition is impaired.        Judgment: Judgment normal.    Review of Systems  Constitutional: Negative.   HENT: Negative.    Eyes: Negative.   Respiratory: Negative.    Cardiovascular: Negative.   Gastrointestinal: Negative.   Genitourinary: Negative.   Musculoskeletal: Negative.   Skin: Negative.   Neurological: Negative.   Endo/Heme/Allergies: Negative.   Psychiatric/Behavioral:  Positive for depression and memory loss. The patient is nervous/anxious.    Blood pressure (!) 125/98, pulse 83, temperature 98.2 F (36.8 C), temperature source Oral, resp. rate 16, height '5\' 3"'$  (1.6 m), weight 86.2 kg, SpO2 99 %. Body mass index is 33.66 kg/m.   Treatment Plan Summary: Daily contact with patient to assess and evaluate symptoms and progress in treatment, Medication management, and Plan continue current medications.  Parks Ranger, DO 12/01/2021, 12:42 PM

## 2021-12-02 DIAGNOSIS — F323 Major depressive disorder, single episode, severe with psychotic features: Secondary | ICD-10-CM | POA: Diagnosis not present

## 2021-12-02 MED ORDER — QUETIAPINE FUMARATE 100 MG PO TABS
100.0000 mg | ORAL_TABLET | Freq: Every day | ORAL | Status: DC
  Administered 2021-12-02: 100 mg via ORAL
  Filled 2021-12-02: qty 1

## 2021-12-02 NOTE — Progress Notes (Signed)
Slept briefly, continues to seek direction for adls, able to complete task when not told what to do. Ambulates with walker around room, back and forth to bathroom. No complaints of pain voiced at this time.

## 2021-12-02 NOTE — Plan of Care (Signed)
  Problem: Safety: Goal: Non-violent Restraint(s) Outcome: Progressing   Problem: Education: Goal: Knowledge of General Education information will improve Description: Including pain rating scale, medication(s)/side effects and non-pharmacologic comfort measures Outcome: Progressing   Problem: Health Behavior/Discharge Planning: Goal: Ability to manage health-related needs will improve Outcome: Progressing   Problem: Clinical Measurements: Goal: Ability to maintain clinical measurements within normal limits will improve Outcome: Progressing Goal: Will remain free from infection Outcome: Progressing Goal: Diagnostic test results will improve Outcome: Progressing Goal: Respiratory complications will improve Outcome: Progressing Goal: Cardiovascular complication will be avoided Outcome: Progressing   Problem: Activity: Goal: Risk for activity intolerance will decrease Outcome: Progressing   Problem: Nutrition: Goal: Adequate nutrition will be maintained Outcome: Progressing   Problem: Coping: Goal: Level of anxiety will decrease Outcome: Progressing   Problem: Pain Managment: Goal: General experience of comfort will improve Outcome: Progressing   Problem: Safety: Goal: Ability to remain free from injury will improve Outcome: Progressing   Problem: Skin Integrity: Goal: Risk for impaired skin integrity will decrease Outcome: Progressing   Problem: Education: Goal: Knowledge of [place] General Education information/materials will improve Outcome: Progressing Goal: Emotional status will improve Outcome: Progressing Goal: Mental status will improve Outcome: Progressing Goal: Verbalization of understanding the information provided will improve Outcome: Progressing   Problem: Health Behavior/Discharge Planning: Goal: Identification of resources available to assist in meeting health care needs will improve Outcome: Progressing Goal: Compliance with treatment  plan for underlying cause of condition will improve Outcome: Progressing   Problem: Physical Regulation: Goal: Ability to maintain clinical measurements within normal limits will improve Outcome: Progressing   Problem: Safety: Goal: Periods of time without injury will increase Outcome: Progressing

## 2021-12-02 NOTE — Progress Notes (Signed)
Physical Therapy Treatment Patient Details Name: Danielle [name] MRN: [id number] DOB: 07-21-1945 Today's Date: 12/02/2021   History of Present Illness Patient is a 76 year old white female who was voluntarily admitted on transfer from [facility] with a diagnosis of pseudodementia.  Long history of depression and recent memory problems. Initially presented to the hospital on 11/12 with SOB, LE swelling, worsening panic attacks and recent admission prior to that with falls, lumbar compression Fx with d/c recommended for SNF at that time. PT asked to see for numerus falls recently    PT Comments    Pt walking on her own in hallway without walker upon arrival.  Pt wearing slide type slippers and scuffing feet with short shuffling steps.  She walks 100' with no AD and supervision.  Walks back to room and trial of gait with walker.  Overall gait and safety is improved with walker use but she does need min cues for position and navigation at times.  She does demonstrate difficulty motor planning on how to turn and sit in chair.  Scattered in conversation for example was talking about reading mystery books and in the same thought begins talking about it being a long time since she held that baby.  Asked to to lay down in bed as bed mobility was challenging for her previously and she had difficulty understanding cues "what level is that"  when questioned " what number level is that on."  Cues to continue with task seemed to cause her more confusion and task was stopped.  She as assisted to chair to rest as further cues for exercises or functional activities are not fruitful at this time.   Recommendations for follow up therapy are one component of a multi-disciplinary discharge planning process, led by the attending physician.  Recommendations may be updated based on patient status, additional functional criteria and insurance authorization.  Follow Up Recommendations  Home health PT     Assistance  Recommended at Discharge Frequent or constant Supervision/Assistance  Patient can return home with the following A little help with walking and/or transfers;A little help with bathing/dressing/bathroom;Assistance with cooking/housework;Direct supervision/assist for medications management;Assist for transportation;Help with stairs or ramp for entrance   Equipment Recommendations  Rolling walker (2 wheels)    Recommendations for Other Services       Precautions / Restrictions Precautions Precautions: Fall Restrictions Weight Bearing Restrictions: No     Mobility  Bed Mobility                    Transfers Overall transfer level: Needs assistance Equipment used: Rolling walker (2 wheels) Transfers: Sit to/from Stand Sit to Stand: Min guard                Ambulation/Gait Ambulation/Gait assistance: Min guard, Supervision Gait Distance (Feet): 100 Feet Assistive device: Rolling walker (2 wheels), None Gait Pattern/deviations: Step-to pattern, Decreased step length - right, Decreased step length - left, Trunk flexed Gait velocity: decreased     General Gait Details: shuffling feet.  improved with walker.  does wear slide type slippers that do not improve gait.   Stairs             Wheelchair Mobility    Modified Rankin (Stroke Patients Only)       Balance Overall balance assessment: Needs assistance Sitting-balance support: Feet supported Sitting balance-Leahy Scale: Good     Standing balance support: No upper extremity supported Standing balance-Leahy Scale: Fair  Cognition Arousal/Alertness: Awake/alert Behavior During Therapy: WFL for tasks assessed/performed Overall Cognitive Status: History of cognitive impairments - at baseline                                          Exercises      General Comments        Pertinent Vitals/Pain Pain Assessment Pain Assessment: No/denies  pain    Home Living                          Prior Function            PT Goals (current goals can now be found in the care plan section) Progress towards PT goals: Progressing toward goals    Frequency    Min 2X/week      PT Plan      Co-evaluation              AM-PAC PT "6 Clicks" Mobility   Outcome Measure  Help needed turning from your back to your side while in a flat bed without using bedrails?: None Help needed moving from lying on your back to sitting on the side of a flat bed without using bedrails?: A Lot Help needed moving to and from a bed to a chair (including a wheelchair)?: A Little Help needed standing up from a chair using your arms (e.g., wheelchair or bedside chair)?: A Little Help needed to walk in hospital room?: A Little Help needed climbing 3-5 steps with a railing? : A Little 6 Click Score: 18    End of Session   Activity Tolerance: Patient limited by fatigue Patient left: in bed;with call bell/phone within reach Nurse Communication: Mobility status PT Visit Diagnosis: Unsteadiness on feet (R26.81);Muscle weakness (generalized) (M62.81)     Time: 1031-5945 PT Time Calculation (min) (ACUTE ONLY): 18 min  Charges:  $Gait Training: 8-22 mins                   Chesley Noon, PTA 12/02/21, 1:16 PM

## 2021-12-02 NOTE — Progress Notes (Signed)
Remains on 1:1 hourly rounding  1900 patient  noted in dayroom socializing with spouse during visiting hours. 2000 Remains in [place] at bedside 2200 Patient in bedroom resting eyes open continues to pull at covers, up and down out of bed 2300 Patient in bed resting eyes closed 0100-0330 patient continues with 1:1 sitter at bedside, sleeping few minutes at a time, continues with confusion, getting up and down, complains of nausea, pain and anxiety. Prns given with no relief 0400 Patient got self  in bed, eyes open at present.

## 2021-12-02 NOTE — Progress Notes (Signed)
Patient is alert and oriented times 1. She is extremely confused. Mood and affect sad/sullen. Patient c/o pain in back. She denies SI, HI, and AVH. Also denies feelings of anxiety and depression at this time. States she slept good last night. Morning meds given whole by mouth W/O difficulty. Ate breakfast in day room- appetite good. Patient remains on unit with Q15 minute checks in place.

## 2021-12-02 NOTE — Progress Notes (Signed)
Patient currently in dayroom , remains with sitter 1:1.

## 2021-12-02 NOTE — Plan of Care (Signed)
  Problem: Safety: Goal: Non-violent Restraint(s) Outcome: Progressing   Problem: Safety: Goal: Ability to remain free from injury will improve Outcome: Progressing

## 2021-12-02 NOTE — Progress Notes (Signed)
Patient pushing walker back and forth as if she is mopping the floor. Pulls sleeve out of shift then asks for help to put it in. Lays down in bed and says, I can't do this and gets back up.

## 2021-12-02 NOTE — Group Note (Signed)
LCSW Group Therapy Note  Group Date: 12/02/2021 Start Time: 1030 End Time: 1130   Type of Therapy and Topic:  Group Therapy - Healthy vs Unhealthy Coping Skills  Participation Level:  Did Not Attend   Description of Group The focus of this group was to determine what unhealthy coping techniques typically are used by group members and what healthy coping techniques would be helpful in coping with various problems. Patients were guided in becoming aware of the differences between healthy and unhealthy coping techniques. Patients were asked to identify 2-3 healthy coping skills they would like to learn to use more effectively.  Therapeutic Goals Patients learned that coping is what human beings do all day long to deal with various situations in their lives Patients defined and discussed healthy vs unhealthy coping techniques Patients identified their preferred coping techniques and identified whether these were healthy or unhealthy Patients determined 2-3 healthy coping skills they would like to become more familiar with and use more often. Patients provided support and ideas to each other   Summary of Patient Progress:    X  Therapeutic Modalities Cognitive Behavioral Therapy Motivational Interviewing  Kani Jobson A [place], [place] 12/02/2021  2:15 PM

## 2021-12-02 NOTE — Progress Notes (Signed)
[facility] MD Progress Note  12/02/2021 10:34 AM Danielle [name]  MRN:  [id number] Subjective: Danielle [name] is seen on rounds.  She is not on presentation.  The chart reveals that when she is given directions she gets confused with ADL's.  When you do not give directions sometimes she does her ADLs just fine.  She is alert and oriented x 3.  She can answer questions appropriately and carry on a conversation but then will make just random statements. This is not pseudodementia and neurology was pretty sure that this was not dementia, but Occupational Therapy thought it might be.  I told social work that she needs neuropsych testing on an outpatient basis.  From the notes in the chart it sounds like she is up and down at night.  May change her medication from Zyprexa to Seroquel.  She tells me that she feels fine.  Principal Problem: Major depressive disorder, single episode, severe with psychotic features ([place]) Diagnosis: Principal Problem:   Major depressive disorder, single episode, severe with psychotic features ([place])  Total Time spent with patient: 15 minutes  Past Psychiatric History:  Quite extensive but I do not see any admissions even though she said that she has been admitted numerous times in the past mostly at [facility].  She recently was going to [facility] and seeing [name] but then [name] went on a vacation and she got frustrated and switched to [employer] at [place].  She has been on numerous medications in the past.    Past Medical History:  Past Medical History:  Diagnosis Date   Arthritis 02/13/2011   osteoarthritis, hips, knees,s/p Cervical fusion(DDD)   Atherosclerosis of aorta ([place]) 05/30/2015   on CTA chest   CAD (coronary artery disease)    a. 09/2016: Coronary CT showing mid-LAD plaque with 20-50% associated stenosis but no significant stenosis by FFR analysis.    Dementia ([place])    Depression with anxiety    Diabetes mellitus without complication ([place])    Difficult  intubation 02/13/2011   with surgery -multiple times, no problems with recent surgeries   GERD (gastroesophageal reflux disease) 02/13/2011   tx. Omeprazole   Glaucoma 02/13/2011   bil. tx. eye drops daily   Headache(784.0) 02/13/2011   past hx. migraines, none recent   Heart murmur 02/13/2011   was told-no issues with this   Hypercholesterolemia    Hypertension 02/13/2011   Hypothyroidism 02/13/2011   tx. with Levothyroxine   Major depressive disorder    Mitral regurgitation 06/01/2015   Mild   Sinus drainage 02/13/2011   uses Zyrtec as needed   Sleep apnea 02/13/2011   Hx. sleep apnea-uses cpap since 10'12 nightly    Past Surgical History:  Procedure Laterality Date   ABDOMINAL HYSTERECTOMY  02/13/2011   APPENDECTOMY  02/13/2011   Lap. removal '92   BREAST CYST EXCISION Left    CERVICAL FUSION  02/13/2011   '92- retained hardware   CHOLECYSTECTOMY  02/13/2011   '98-lap. galbladder removal due to stones   GUM SURGERY  02/13/2011   "small mouth"   JOINT REPLACEMENT  02/13/2011   RTHA-a yr ago in [place] Left 08/15/2019   Procedure: EXCISION OF SEBACEOUS CYST LEFT BREAST;  Surgeon: Georganna Skeans, MD;  Location: [facility];  Service: General;  Laterality: Left;   TONSILLECTOMY  02/13/2011   child   TOTAL HIP ARTHROPLASTY  02/17/2011   Procedure: TOTAL HIP ARTHROPLASTY ANTERIOR APPROACH;  Surgeon: Mauri Pole, MD;  Location: [facility];  Service: Orthopedics;  Laterality: Left;   TUBAL LIGATION  02/13/2011   Family History:  Family History  Problem Relation Age of Onset   Cancer Mother        lung   Breast cancer Maternal Aunt    Alzheimer's disease Neg Hx    Dementia Neg Hx    Family Psychiatric  History: Unremarkable Social History:  Social History   Substance and Sexual Activity  Alcohol Use Yes   Comment: rare     Social History   Substance and Sexual Activity  Drug Use No    Social History   Socioeconomic History    Marital status: Married    Spouse name: Not on file   Number of children: Not on file   Years of education: Not on file   Highest education level: Not on file  Occupational History   Occupation: Retired Not working  Tobacco Use   Smoking status: Never   Smokeless tobacco: Never  Vaping Use   Vaping Use: Never used  Substance and Sexual Activity   Alcohol use: Yes    Comment: rare   Drug use: No   Sexual activity: Yes    Birth control/protection: Surgical    Comment: hyst  Other Topics Concern   Not on file  Social History Narrative   Caffeine le-ss then once cup daily. Education: HS diploma.  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     Social Determinants of Health   Financial Resource Strain: Not on file  Food Insecurity: No Food Insecurity (11/26/2021)   Hunger Vital Sign    Worried About Running Out of Food in the Last Year: Never true    Ran Out of Food in the Last Year: Never true  Transportation Needs: No Transportation Needs (11/26/2021)   PRAPARE - Hydrologist (Medical): No    Lack of Transportation (Non-Medical): No  Physical Activity: Not on file  Stress: Not on file  Social Connections: Not on file   Additional Social History:                         Sleep: Fair  Appetite:  Good  Current Medications: Current Facility-Administered Medications  Medication Dose Route Frequency Provider Last Rate Last Admin   alum & mag hydroxide-simeth (MAALOX/MYLANTA) 200-200-20 MG/5ML suspension 30 mL  30 mL Oral Q4H PRN Parks Ranger, DO       docusate sodium (COLACE) capsule 100 mg  100 mg Oral Daily Rulon Sera, MD   100 mg at 12/02/21 0930   FLUoxetine (PROZAC) capsule 20 mg  20 mg Oral Daily Parks Ranger, DO   20 mg at 12/02/21 0928   fluticasone (FLONASE) 50 MCG/ACT nasal spray 2 spray  2 spray Each Nare Daily Parks Ranger, DO   2 spray at 12/02/21 8341   furosemide (LASIX)  tablet 10 mg  10 mg Oral Daily Parks Ranger, DO   10 mg at 12/02/21 9622   hydrocortisone cream 0.5 %   Topical TID PRN Parks Ranger, DO   Given at 11/27/21 2126   hydrOXYzine (ATARAX) tablet 25 mg  25 mg Oral Q6H PRN Parks Ranger, DO   25 mg at 12/01/21 1325   ibuprofen (ADVIL) tablet 600 mg  600 mg Oral Q6H PRN Caroline Sauger, NP   600 mg at 12/02/21 0927   levothyroxine (SYNTHROID) tablet 150  mcg  150 mcg Oral Q0600 Parks Ranger, DO   150 mcg at 12/02/21 9937   lidocaine (LIDODERM) 5 % 1 patch  1 patch Transdermal Q24H Caroline Sauger, NP   1 patch at 12/02/21 0016   LORazepam (ATIVAN) tablet 1 mg  1 mg Oral Q4H PRN Parks Ranger, DO   1 mg at 12/02/21 0016   magnesium hydroxide (MILK OF MAGNESIA) suspension 30 mL  30 mL Oral Daily PRN Parks Ranger, DO   30 mL at 11/28/21 2021   metoprolol tartrate (LOPRESSOR) tablet 12.5 mg  12.5 mg Oral Q2000 Parks Ranger, DO   12.5 mg at 12/01/21 2105   nitroGLYCERIN (NITROSTAT) SL tablet 0.4 mg  0.4 mg Sublingual Q5 min PRN Parks Ranger, DO       ondansetron [facility]) tablet 4 mg  4 mg Oral Q8H PRN Parks Ranger, DO   4 mg at 12/01/21 2345   pantoprazole (PROTONIX) EC tablet 40 mg  40 mg Oral Daily Parks Ranger, DO   40 mg at 12/02/21 1696   polyethylene glycol (MIRALAX / GLYCOLAX) packet 17 g  17 g Oral Daily Parks Ranger, DO   17 g at 12/02/21 7893   QUEtiapine (SEROQUEL) tablet 100 mg  100 mg Oral QHS Parks Ranger, DO       risperiDONE (RISPERDAL) tablet 1 mg  1 mg Oral Q6H PRN Parks Ranger, DO       rosuvastatin (CRESTOR) tablet 40 mg  40 mg Oral Daily Parks Ranger, DO   40 mg at 12/02/21 8101   traMADol (ULTRAM) tablet 50 mg  50 mg Oral Q6H PRN Parks Ranger, DO   50 mg at 12/01/21 2105   traZODone (DESYREL) tablet 100 mg  100 mg Oral QHS PRN Parks Ranger, DO   100 mg at  12/01/21 2105    Lab Results: No results found for this or any previous visit (from the past 48 hour(s)).  Blood Alcohol level:  No results found for: "ETH"  Metabolic Disorder Labs: Lab Results  Component Value Date   HGBA1C 5.7 (H) 10/28/2021   MPG 116.89 10/28/2021   MPG 114.02 10/08/2016   No results found for: "PROLACTIN" Lab Results  Component Value Date   CHOL 155 11/29/2021   TRIG 117 11/29/2021   HDL 63 11/29/2021   CHOLHDL 2.5 11/29/2021   VLDL 23 11/29/2021   LDLCALC 69 11/29/2021   LDLCALC 86 10/08/2016    Physical Findings: AIMS:  , ,  ,  ,    CIWA:    COWS:     Musculoskeletal: Strength & Muscle Tone: within normal limits Gait & Station: normal Patient leans: N/A  Psychiatric Specialty Exam:  Presentation  General Appearance:  Appropriate for Environment; Casual; Fairly Groomed  Eye Contact: Good  Speech: Clear and Coherent; Normal Rate  Speech Volume: Normal  Handedness: Right   Mood and Affect  Mood: Anxious  Affect: Appropriate; Congruent   Thought Process  Thought Processes: Coherent  Descriptions of Associations:Intact  Orientation:Full (Time, Place and Person)  Thought Content:Logical; Rumination  History of Schizophrenia/Schizoaffective disorder:No  Duration of Psychotic Symptoms:N/A  Hallucinations:No data recorded Ideas of Reference:None  Suicidal Thoughts:No data recorded Homicidal Thoughts:No data recorded  Sensorium  Memory: Immediate Fair; Recent Fair; Remote Fair  Judgment: Fair  Insight: Fair   Executive Functions  Concentration: Good  Attention Span: Good  Recall: [place] of Knowledge: Fair  LanguageKermit Balo  Psychomotor Activity  Psychomotor Activity:No data recorded  Assets  Assets: Communication Skills; Desire for Improvement; Physical Health; Resilience   Sleep  Sleep:No data recorded   Physical Exam: Physical Exam Vitals and nursing note reviewed.   Constitutional:      Appearance: Normal appearance. She is normal weight.  Neurological:     General: No focal deficit present.     Mental Status: She is alert and oriented to person, place, and time.  Psychiatric:        Attention and Perception: Attention and perception normal.        Mood and Affect: Mood and affect normal.        Speech: Speech is tangential.        Behavior: Behavior normal. Behavior is cooperative.        Thought Content: Thought content normal.        Cognition and Memory: Memory normal. Cognition is impaired.        Judgment: Judgment normal.    Review of Systems  Constitutional: Negative.   HENT: Negative.    Eyes: Negative.   Respiratory: Negative.    Cardiovascular: Negative.   Gastrointestinal: Negative.   Genitourinary: Negative.   Musculoskeletal: Negative.   Skin: Negative.   Neurological: Negative.   Endo/Heme/Allergies: Negative.   Psychiatric/Behavioral:  The patient has insomnia.    Blood pressure (!) 111/54, pulse 87, temperature 98.5 F (36.9 C), temperature source Oral, resp. rate 18, height '5\' 3"'$  (1.6 m), weight 86.2 kg, SpO2 99 %. Body mass index is 33.66 kg/m.   Treatment Plan Summary: Daily contact with patient to assess and evaluate symptoms and progress in treatment, Medication management, and Plan discontinue melatonin and Remeron and Zyprexa and start Seroquel 100 mg at bedtime.  Parks Ranger, DO 12/02/2021, 10:34 AM

## 2021-12-03 DIAGNOSIS — F323 Major depressive disorder, single episode, severe with psychotic features: Secondary | ICD-10-CM | POA: Diagnosis not present

## 2021-12-03 MED ORDER — FLUOXETINE HCL 20 MG PO CAPS: 20.0000 mg | ORAL_CAPSULE | Freq: Every day | ORAL | 3 refills | Status: DC

## 2021-12-03 MED ORDER — QUETIAPINE FUMARATE 100 MG PO TABS: 100.0000 mg | ORAL_TABLET | Freq: Every day | ORAL | 3 refills | Status: DC

## 2021-12-03 MED ORDER — METOPROLOL TARTRATE 25 MG PO TABS: 12.5000 mg | ORAL_TABLET | Freq: Every day | ORAL | 3 refills | Status: DC

## 2021-12-03 MED ORDER — TRAZODONE HCL 100 MG PO TABS: 100.0000 mg | ORAL_TABLET | Freq: Every evening | ORAL | 3 refills | Status: DC | PRN

## 2021-12-03 MED ORDER — HYDROXYZINE HCL 25 MG PO TABS: 25.0000 mg | ORAL_TABLET | Freq: Four times a day (QID) | ORAL | 0 refills | Status: DC | PRN

## 2021-12-03 NOTE — BHH Suicide Risk Assessment (Signed)
[facility] Discharge Suicide Risk Assessment   Principal Problem: Major depressive disorder, single episode, severe with psychotic features ([place]) Discharge Diagnoses: Principal Problem:   Major depressive disorder, single episode, severe with psychotic features ([place])   Total Time spent with patient: 1 hour  Musculoskeletal: Strength & Muscle Tone: within normal limits Gait & Station: normal Patient leans: N/A  Psychiatric Specialty Exam  Presentation  General Appearance:  Appropriate for Environment; Casual; Fairly Groomed  Eye Contact: Good  Speech: Clear and Coherent; Normal Rate  Speech Volume: Normal  Handedness: Right   Mood and Affect  Mood: Anxious  Duration of Depression Symptoms: Greater than two weeks  Affect: Appropriate; Congruent   Thought Process  Thought Processes: Coherent  Descriptions of Associations:Intact  Orientation:Full (Time, Place and Person)  Thought Content:Logical; Rumination  History of Schizophrenia/Schizoaffective disorder:No  Duration of Psychotic Symptoms:N/A  Hallucinations:No data recorded Ideas of Reference:None  Suicidal Thoughts:No data recorded Homicidal Thoughts:No data recorded  Sensorium  Memory: Immediate Fair; Recent Fair; Remote Fair  Judgment: Fair  Insight: Fair   Community education officer  Concentration: Good  Attention Span: Good  Recall: [place] of Knowledge: Fair  Language: Good   Psychomotor Activity  Psychomotor Activity:No data recorded  Assets  Assets: Communication Skills; Desire for Improvement; Physical Health; Resilience   Sleep  Sleep:No data recorded   Blood pressure (!) 106/58, pulse 94, temperature (!) 97.5 F (36.4 C), temperature source Oral, resp. rate 18, height '5\' 3"'$  (1.6 m), weight 86.2 kg, SpO2 95 %. Body mass index is 33.66 kg/m.  Mental Status Per Nursing Assessment::   On Admission:  NA  Demographic Factors:  Age 64 or older and  Caucasian  Loss Factors: NA  Historical Factors: NA  Risk Reduction Factors:   Living with another person, especially a relative and Positive social support  Continued Clinical Symptoms:  Severe Anxiety and/or Agitation  Cognitive Features That Contribute To Risk:  Loss of executive function    Suicide Risk:  Minimal: No identifiable suicidal ideation.  Patients presenting with no risk factors but with morbid ruminations; may be classified as minimal risk based on the severity of the depressive symptoms   Follow-up Information     [facility] Follow up.   Why: You have an appointment scheduled for follow up on Wednesday November 29th at 2:30PM. Please contact office to reschedule if needed. Thanks! Contact information: [address] [place] [address]                Plan Of Care/Follow-up recommendations: [place], DO 12/03/2021, 10:08 AM

## 2021-12-03 NOTE — Progress Notes (Signed)
Required PRN and constant redirection on safety to prevent falls and maintain clothing on. Denies SI/HI   12/02/21 2300  Psych Admission Type (Psych Patients Only)  Admission Status Voluntary  Psychosocial Assessment  Patient Complaints Anxiety;Depression  Eye Contact Fair  Facial Expression Flat  Affect Flat  Speech Logical/coherent  Interaction Assertive  Motor Activity Slow  Appearance/Hygiene Unremarkable  Behavior Characteristics Restless;Anxious  Mood Labile  Thought Process  Coherency WDL  Content WDL  Delusions None reported or observed  Perception Hallucinations  Hallucination Visual  Judgment Impaired  Confusion Mild  Danger to Self  Current suicidal ideation? Denies  Danger to Others  Danger to Others None reported or observed

## 2021-12-03 NOTE — Care Management Important Message (Signed)
Important Message  Patient Details  Name: Danielle [name] MRN: [id number] Date of Birth: [birth year]   Medicare Important Message Given:  Yes     Danielle [name], [name] 12/03/2021, 9:41 AM

## 2021-12-03 NOTE — Discharge Summary (Signed)
Physician Discharge Summary Note  Patient:  Danielle [name] is an 76 y.o., female MRN:  [id number] DOB:  11-19-1945 Patient phone:  [phone] (home)  Patient address:   [place] [address],  Total Time spent with patient: 1 hour  Date of Admission:  11/26/2021 Date of Discharge: 12/03/2021  Reason for Admission:  Danielle [name] is a 62 year old white female who was voluntarily admitted on transfer from [facility] with a diagnosis of pseudodementia.  Long history of depression and recent memory problems.  She states that she was cleaning the bathroom and fell and hit her head she presented to the emergency room where she was worked up and discharged.  Apparently, she was at her son's house and was hearing voices and the next thing she knew she was on the ground.  She is a poor historian and this is coming from her.  She states that she has been depressed most of her life but denies any suicidal ideation.  She continues to hear voices and sees shadows.  She denies any auditory hallucinations at this time.  She had an extensive medical workup recently which is noted below.  CT of her head showed possible ischemic small vessel disease.  MRI was negative for the most part but was not completed.  She does have a normal ejection fraction.  She is currently seeing Lesle Chris and was seeing [name] at [facility].  I asked if she had ever had ECT and she said no.  She has been on numerous medications in the past.  Since admission she apparently has been more cognitive and alert.  She answers questions appropriately and knows where she is at.  She does fall asleep but she just received Ativan.   PER DISCHARGE SUMMARY: Danielle [name] is a 76 y.o. female with PMH significant for DM2, HTN, HLD, CAD, cognitive decline/memory loss, depression/anxiety, hypothyroidism, OSA on CPAP who was recently hospitalized 10/16-10/24 for frequent falls, lumbar compression fracture, AKI,  UTI.  Hospital course was also complicated by delirium/hallucinations. PT OT had recommended SNF but family declined and she was discharged with home health.   11/12, patient presented to the ED with complaint of shortness of breath, leg swelling, worsening anxiety/panic attacks/depression, delirium/hallucinations, insomnia, poor appetite.  Family is having difficulty taking care of her at home. In the ED, vital signs stable.  Labs showing BNP 166.   UA with negative nitrite, small amount of leukocytes, and microscopy showing 6-10 WBCs and rare bacteria.   Chest x-ray showed cardiomegaly with mild diffuse bilateral interstitial opacity consistent with edema or atypical/viral infection; no focal airspace opacity.  Patient was given fentanyl and IV Lasix 40 mg.   Admitted to [facility] On chart review it was noted that patient was having memory issues for last several months.  She was seen by neurology as an outpatient and was planned for an MRI brain which has not been done yet.  Patient lately has been having a lot of panic attacks. For the past few days she noticed that both of her legs are swollen.  She feels winded just walking to her mailbox at home.     In the ED, patient was afebrile, heart rate in 70s and 80s, blood pressure in 140s 150s, breathing on room air. Labs from this morning with negative procalcitonin.  Stable renal function.  Normal TSH. Echo with EF 60 to 65%.  Principal Problem: Major depressive disorder, single episode, severe with psychotic features [facility]) Discharge  Diagnoses: Principal Problem:   Major depressive disorder, single episode, severe with psychotic features [facility])   Past Psychiatric History: Quite extensive but I do not see any admissions even though she said that she has been admitted numerous times in the past mostly at [facility].  She recently was going to [facility] and seeing [name] but then [name] went on a vacation and she got frustrated and switched to [facility].  She has been on numerous medications in the past.     Past Medical History:  Past Medical History:  Diagnosis Date   Arthritis 02/13/2011   osteoarthritis, hips, knees,s/p Cervical fusion(DDD)   Atherosclerosis of aorta ([place]) 05/30/2015   on CTA chest   CAD (coronary artery disease)    a. 09/2016: Coronary CT showing mid-LAD plaque with 20-50% associated stenosis but no significant stenosis by FFR analysis.    Dementia ([place])    Depression with anxiety    Diabetes mellitus without complication ([place])    Difficult intubation 02/13/2011   with surgery -multiple times, no problems with recent surgeries   GERD (gastroesophageal reflux disease) 02/13/2011   tx. Omeprazole   Glaucoma 02/13/2011   bil. tx. eye drops daily   Headache(784.0) 02/13/2011   past hx. migraines, none recent   Heart murmur 02/13/2011   was told-no issues with this   Hypercholesterolemia    Hypertension 02/13/2011   Hypothyroidism 02/13/2011   tx. with Levothyroxine   Major depressive disorder    Mitral regurgitation 06/01/2015   Mild   Sinus drainage 02/13/2011   uses Zyrtec as needed   Sleep apnea 02/13/2011   Hx. sleep apnea-uses cpap since 10'12 nightly    Past Surgical History:  Procedure Laterality Date   ABDOMINAL HYSTERECTOMY  02/13/2011   APPENDECTOMY  02/13/2011   Lap. removal '92   BREAST CYST EXCISION Left    CERVICAL FUSION  02/13/2011   '92- retained hardware   CHOLECYSTECTOMY  02/13/2011   '98-lap. galbladder removal due to stones   GUM SURGERY  02/13/2011   "small mouth"   JOINT REPLACEMENT  02/13/2011   RTHA-a yr ago in [place] Left 08/15/2019   Procedure: EXCISION OF SEBACEOUS CYST LEFT BREAST;  Surgeon: Georganna Skeans, MD;  Location: [facility];  Service: General;  Laterality: Left;   TONSILLECTOMY  02/13/2011   child   TOTAL HIP ARTHROPLASTY  02/17/2011   Procedure: TOTAL HIP ARTHROPLASTY ANTERIOR APPROACH;  Surgeon:  Mauri Pole, MD;  Location: [facility];  Service: Orthopedics;  Laterality: Left;   TUBAL LIGATION  02/13/2011   Family History:  Family History  Problem Relation Age of Onset   Cancer Mother        lung   Breast cancer Maternal Aunt    Alzheimer's disease Neg Hx    Dementia Neg Hx    Family Psychiatric  History: Unremarkable Social History:  Social History   Substance and Sexual Activity  Alcohol Use Yes   Comment: rare     Social History   Substance and Sexual Activity  Drug Use No    Social History   Socioeconomic History   Marital status: Married    Spouse name: Not on file   Number of children: Not on file   Years of education: Not on file   Highest education level: Not on file  Occupational History   Occupation: Retired Not working  Tobacco Use   Smoking status: Never   Smokeless tobacco:  Never  Vaping Use   Vaping Use: Never used  Substance and Sexual Activity   Alcohol use: Yes    Comment: rare   Drug use: No   Sexual activity: Yes    Birth control/protection: Surgical    Comment: hyst  Other Topics Concern   Not on file  Social History Narrative   Caffeine le-ss then once cup daily. Education: HS diploma.  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     Social Determinants of Health   Financial Resource Strain: Not on file  Food Insecurity: No Food Insecurity (11/26/2021)   Hunger Vital Sign    Worried About Running Out of Food in the Last Year: Never true    Ran Out of Food in the Last Year: Never true  Transportation Needs: No Transportation Needs (11/26/2021)   PRAPARE - Hydrologist (Medical): No    Lack of Transportation (Non-Medical): No  Physical Activity: Not on file  Stress: Not on file  Social Connections: Not on file    Hospital Course: Danielle [name]) is a 76 year old white female who was voluntarily admitted to inpatient psychiatry on a transfer from [facility]  where she had a workup for cognitive problems and dyspnea.  Medically, everything came back normal.  Psychiatry was consulted and diagnosed her with pseudodementia.  They wanted her to be transferred to geriatric psychiatry for further evaluation.  She did see neurology on October 5 and the conclusion was that her memory problems was not consistent with neuro degenerative disease.  While on the inpatient unit her Zoloft was changed to Prozac and Zyprexa and Remeron were initiated.  She was not sleeping very well through the night so those were discontinued and she was started on Seroquel with better success.  She has some blood pressure problems and she was initiated on Lasix but then her blood pressure came down so it was discontinued.  She remained on Lopressor 12.5 mg/day.  Cognitively, she would have a normal conversation with you but then would speak in a tangential way that had nothing to do with the conversation.  It was very odd.  She was alert and oriented x 3 and pleasant and cooperative but would go into these episodes of being confused.  She denied suicidal ideation throughout her hospitalization.  She told me that her hallucinations went away.  It was felt that she maximized hospitalization she was discharged home.  I would recommend continued neurology and psychiatry follow-up along with neuro psychological testing.  On the day of discharge she denied suicidal ideation, homicidal ideation, auditory or visual hallucinations.  Her judgment and insight were poor.  Physical Findings: AIMS:  , ,  ,  ,    CIWA:    COWS:     Musculoskeletal: Strength & Muscle Tone: within normal limits Gait & Station: normal Patient leans: N/A   Psychiatric Specialty Exam:  Presentation  General Appearance:  Appropriate for Environment; Casual; Fairly Groomed  Eye Contact: Good  Speech: Clear and Coherent; Normal Rate  Speech Volume: Normal  Handedness: Right   Mood and Affect   Mood: Anxious  Affect: Appropriate; Congruent   Thought Process  Thought Processes: Coherent  Descriptions of Associations:Intact  Orientation:Full (Time, Place and Person)  Thought Content:Logical; Rumination  History of Schizophrenia/Schizoaffective disorder:No  Duration of Psychotic Symptoms:N/A  Hallucinations:No data recorded Ideas of Reference:None  Suicidal Thoughts:No data recorded Homicidal Thoughts:No data recorded  Sensorium  Memory: Immediate  Fair; Recent Fair; Remote Fair  Judgment: Fair  Insight: Fair   Executive Functions  Concentration: Good  Attention Span: Good  Recall: [employer] of Knowledge: Fair  Language: Good   Psychomotor Activity  Psychomotor Activity:No data recorded  Assets  Assets: Communication Skills; Desire for Improvement; Physical Health; Resilience   Sleep  Sleep:No data recorded   Physical Exam: Physical Exam Vitals and nursing note reviewed.  Constitutional:      Appearance: Normal appearance. She is normal weight.  Neurological:     General: No focal deficit present.     Mental Status: She is alert and oriented to person, place, and time.  Psychiatric:        Attention and Perception: Attention and perception normal.        Mood and Affect: Mood is anxious.        Speech: Speech is tangential.        Behavior: Behavior normal. Behavior is cooperative.        Thought Content: Thought content normal.        Cognition and Memory: Memory normal. Cognition is impaired.        Judgment: Judgment is inappropriate.    Review of Systems  Constitutional: Negative.   HENT: Negative.    Eyes: Negative.   Respiratory: Negative.    Cardiovascular: Negative.   Gastrointestinal: Negative.   Genitourinary: Negative.   Musculoskeletal: Negative.   Skin: Negative.   Neurological: Negative.   Endo/Heme/Allergies: Negative.   Psychiatric/Behavioral:  Positive for memory loss. The patient has insomnia.     Blood pressure (!) 106/58, pulse 94, temperature (!) 97.5 F (36.4 C), temperature source Oral, resp. rate 18, height '5\' 3"'$  (1.6 m), weight 86.2 kg, SpO2 95 %. Body mass index is 33.66 kg/m.   Social History   Tobacco Use  Smoking Status Never  Smokeless Tobacco Never   Tobacco Cessation:  N/A, patient does not currently use tobacco products   Blood Alcohol level:  No results found for: "ETH"  Metabolic Disorder Labs:  Lab Results  Component Value Date   HGBA1C 5.7 (H) 10/28/2021   MPG 116.89 10/28/2021   MPG 114.02 10/08/2016   No results found for: "PROLACTIN" Lab Results  Component Value Date   CHOL 155 11/29/2021   TRIG 117 11/29/2021   HDL 63 11/29/2021   CHOLHDL 2.5 11/29/2021   VLDL 23 11/29/2021   LDLCALC 69 11/29/2021   [place] 86 10/08/2016    See Psychiatric Specialty Exam and Suicide Risk Assessment completed by Attending Physician prior to discharge.  Discharge destination:  Home  Is patient on multiple antipsychotic therapies at discharge:  No   Has Patient had three or more failed trials of antipsychotic monotherapy by history:  No  Recommended Plan for Multiple Antipsychotic Therapies: NA   Allergies as of 12/03/2021       Reactions   Ceftin [cefuroxime Axetil] Hives, Swelling, Other (See Comments)   Swelling of face   Codeine Nausea Only   Compazine [prochlorperazine] Swelling, Other (See Comments)   Face and tongue swelling        Medication List     STOP taking these medications    furosemide 20 MG tablet Commonly known as: LASIX   mirtazapine 15 MG tablet Commonly known as: REMERON   OLANZapine 10 MG tablet Commonly known as: ZyPREXA   polyethylene glycol 17 g packet Commonly known as: MIRALAX / GLYCOLAX   sertraline 50 MG tablet Commonly known as: Zoloft  TAKE these medications      Indication  CALCIUM-VITAMIN D PO Take 1 capsule by mouth daily. 1200-1000 calcium-vitamin d    FLUoxetine 20 MG  capsule Commonly known as: PROZAC Take 1 capsule (20 mg total) by mouth daily. Start taking on: December 04, 2021  Indication: Major Depressive Disorder   fluticasone 50 MCG/ACT nasal spray Commonly known as: FLONASE Place 2 sprays into both nostrils daily.    hydrOXYzine 25 MG tablet Commonly known as: ATARAX Take 1 tablet (25 mg total) by mouth every 6 (six) hours as needed for anxiety.    levothyroxine 137 MCG tablet Commonly known as: SYNTHROID Take 137 mcg by mouth daily before breakfast.    Magnesium 100 MG Caps Take 100 mg by mouth daily.    metoprolol tartrate 25 MG tablet Commonly known as: LOPRESSOR Take 0.5 tablets (12.5 mg total) by mouth daily at 8 pm. What changed: See the new instructions.  Indication: High Blood Pressure Disorder   nitroGLYCERIN 0.4 MG SL tablet Commonly known as: NITROSTAT Place 0.4 mg under the tongue every 5 (five) minutes as needed for chest pain.    ondansetron 4 MG tablet Commonly known as: ZOFRAN Take 4 mg by mouth every 8 (eight) hours as needed for nausea or vomiting.    QUEtiapine 100 MG tablet Commonly known as: SEROQUEL Take 1 tablet (100 mg total) by mouth at bedtime.  Indication: Agitation, Delirium, Major Depressive Disorder   rosuvastatin 40 MG tablet Commonly known as: CRESTOR Take 1 tablet (40 mg total) by mouth daily.    senna-docusate 8.6-50 MG tablet Commonly known as: Senokot-S Take 1 tablet by mouth at bedtime.    traZODone 100 MG tablet Commonly known as: DESYREL Take 1 tablet (100 mg total) by mouth at bedtime as needed for sleep.  Indication: Trouble Sleeping, Major Depressive Disorder   Vitamin D (Ergocalciferol) 1.25 MG (50000 UNIT) Caps capsule Commonly known as: DRISDOL Take 50,000 Units by mouth every Wednesday.         Follow-up Information     [facility] Follow up.   Why: You have an appointment scheduled for follow up on Wednesday November 29th at 2:30PM. Please contact  office to reschedule if needed. Thanks! Contact information: [address] [place] [address]                Follow-up recommendations: Neurology and psychiatry and neuropsychological testing to rule out dementia versus depression versus personality.  [facility] November 29.    Signed: Parks Ranger, DO 12/03/2021, 10:21 AM

## 2021-12-03 NOTE — BH IP Treatment Plan (Signed)
Interdisciplinary Treatment and Diagnostic Plan Update  12/03/2021 Time of Session: 9:00AM Kortni Hasten MRN: [id number]  Principal Diagnosis: Major depressive disorder, single episode, severe with psychotic features ([place])  Secondary Diagnoses: Principal Problem:   Major depressive disorder, single episode, severe with psychotic features ([place])   Current Medications:  Current Facility-Administered Medications  Medication Dose Route Frequency Provider Last Rate Last Admin   alum & mag hydroxide-simeth (MAALOX/MYLANTA) 200-200-20 MG/5ML suspension 30 mL  30 mL Oral Q4H PRN Parks Ranger, DO       docusate sodium (COLACE) capsule 100 mg  100 mg Oral Daily Rulon Sera, MD   100 mg at 12/03/21 6063   FLUoxetine (PROZAC) capsule 20 mg  20 mg Oral Daily Parks Ranger, DO   20 mg at 12/03/21 0923   fluticasone (FLONASE) 50 MCG/ACT nasal spray 2 spray  2 spray Each Nare Daily Parks Ranger, DO   2 spray at 12/03/21 0160   furosemide (LASIX) tablet 10 mg  10 mg Oral Daily Parks Ranger, DO   10 mg at 12/03/21 1093   hydrocortisone cream 0.5 %   Topical TID PRN Parks Ranger, DO   Given at 11/27/21 2126   hydrOXYzine (ATARAX) tablet 25 mg  25 mg Oral Q6H PRN Parks Ranger, DO   25 mg at 12/02/21 2236   ibuprofen (ADVIL) tablet 600 mg  600 mg Oral Q6H PRN Caroline Sauger, NP   600 mg at 12/02/21 2355   levothyroxine (SYNTHROID) tablet 150 mcg  150 mcg Oral Q0600 Parks Ranger, DO   150 mcg at 12/03/21 7322   lidocaine (LIDODERM) 5 % 1 patch  1 patch Transdermal Q24H Caroline Sauger, NP   1 patch at 12/03/21 0000   LORazepam (ATIVAN) tablet 1 mg  1 mg Oral Q4H PRN Parks Ranger, DO   1 mg at 12/02/21 2233   magnesium hydroxide (MILK OF MAGNESIA) suspension 30 mL  30 mL Oral Daily PRN Parks Ranger, DO   30 mL at 11/28/21 2021   metoprolol tartrate (LOPRESSOR) tablet 12.5 mg  12.5 mg Oral Q2000  Parks Ranger, DO   12.5 mg at 12/02/21 2000   nitroGLYCERIN (NITROSTAT) SL tablet 0.4 mg  0.4 mg Sublingual Q5 min PRN Parks Ranger, DO       ondansetron [facility]) tablet 4 mg  4 mg Oral Q8H PRN Parks Ranger, DO   4 mg at 12/01/21 2345   pantoprazole (PROTONIX) EC tablet 40 mg  40 mg Oral Daily Parks Ranger, DO   40 mg at 12/03/21 0811   polyethylene glycol (MIRALAX / GLYCOLAX) packet 17 g  17 g Oral Daily Parks Ranger, DO   17 g at 12/03/21 0254   QUEtiapine (SEROQUEL) tablet 100 mg  100 mg Oral QHS Parks Ranger, DO   100 mg at 12/02/21 2127   risperiDONE (RISPERDAL) tablet 1 mg  1 mg Oral Q6H PRN Parks Ranger, DO       rosuvastatin (CRESTOR) tablet 40 mg  40 mg Oral Daily Parks Ranger, DO   40 mg at 12/03/21 2706   traMADol (ULTRAM) tablet 50 mg  50 mg Oral Q6H PRN Parks Ranger, DO   50 mg at 12/01/21 2105   traZODone (DESYREL) tablet 100 mg  100 mg Oral QHS PRN Parks Ranger, DO   100 mg at 12/02/21 2128   PTA Medications: Medications Prior to Admission  Medication Sig  Dispense Refill Last Dose   CALCIUM-VITAMIN D PO Take 1 capsule by mouth daily. 1200-1000 calcium-vitamin d      fluticasone (FLONASE) 50 MCG/ACT nasal spray Place 2 sprays into both nostrils daily.      furosemide (LASIX) 20 MG tablet Take 1 tablet (20 mg total) by mouth daily. 30 tablet     levothyroxine (SYNTHROID, LEVOTHROID) 137 MCG tablet Take 137 mcg by mouth daily before breakfast.      Magnesium 100 MG CAPS Take 100 mg by mouth daily.      metoprolol tartrate (LOPRESSOR) 25 MG tablet TAKE 1/2 TO 1 TABLET IN THE MORNING AND AT BEDTIME FOR PALPITATIONS. NEED APPOINTMENT FOR FURTHER REFILLS (Patient taking differently: Take 12.5-25 mg by mouth in the morning and at bedtime.) 15 tablet 0    mirtazapine (REMERON) 15 MG tablet Take 1 tablet (15 mg total) by mouth at bedtime. 30 tablet 1    nitroGLYCERIN (NITROSTAT) 0.4  MG SL tablet Place 0.4 mg under the tongue every 5 (five) minutes as needed for chest pain.      OLANZapine (ZYPREXA) 10 MG tablet Take 1 tablet (10 mg total) by mouth at bedtime. 30 tablet 1    ondansetron (ZOFRAN) 4 MG tablet Take 4 mg by mouth every 8 (eight) hours as needed for nausea or vomiting.      polyethylene glycol (MIRALAX / GLYCOLAX) 17 g packet Take 17 g by mouth daily. 14 each 0    rosuvastatin (CRESTOR) 40 MG tablet Take 1 tablet (40 mg total) by mouth daily. 90 tablet 3    senna-docusate (SENOKOT-S) 8.6-50 MG tablet Take 1 tablet by mouth at bedtime.      sertraline (ZOLOFT) 50 MG tablet Take 1 tablet (50 mg total) by mouth daily. Take 1/2 tablet 25 mg for 7 days, then take one whole tablet 50 mg total daily after breakfast (Patient taking differently: Take 50 mg by mouth daily before breakfast.) 30 tablet 1    Vitamin D, Ergocalciferol, (DRISDOL) 1.25 MG (50000 UNIT) CAPS capsule Take 50,000 Units by mouth every Wednesday.       Patient Stressors: Health problems    Patient Strengths: Scientist, research (life sciences)   Treatment Modalities: Medication Management, Group therapy, Case management,  1 to 1 session with clinician, Psychoeducation, Recreational therapy.   Physician Treatment Plan for Primary Diagnosis: Major depressive disorder, single episode, severe with psychotic features ([place]) Long Term Goal(s): Improvement in symptoms so as ready for discharge   Short Term Goals: Ability to identify changes in lifestyle to reduce recurrence of condition will improve Ability to verbalize feelings will improve Ability to disclose and discuss suicidal ideas Ability to demonstrate self-control will improve Ability to identify and develop effective coping behaviors will improve Ability to maintain clinical measurements within normal limits will improve Compliance with prescribed medications will improve Ability to identify triggers associated with substance abuse/mental health issues will  improve  Medication Management: Evaluate patient's response, side effects, and tolerance of medication regimen.  Therapeutic Interventions: 1 to 1 sessions, Unit Group sessions and Medication administration.  Evaluation of Outcomes: Adequate for Discharge  Physician Treatment Plan for Secondary Diagnosis: Principal Problem:   Major depressive disorder, single episode, severe with psychotic features ([place])  Long Term Goal(s): Improvement in symptoms so as ready for discharge   Short Term Goals: Ability to identify changes in lifestyle to reduce recurrence of condition will improve Ability to verbalize feelings will improve Ability to disclose and discuss suicidal ideas Ability to demonstrate self-control  will improve Ability to identify and develop effective coping behaviors will improve Ability to maintain clinical measurements within normal limits will improve Compliance with prescribed medications will improve Ability to identify triggers associated with substance abuse/mental health issues will improve     Medication Management: Evaluate patient's response, side effects, and tolerance of medication regimen.  Therapeutic Interventions: 1 to 1 sessions, Unit Group sessions and Medication administration.  Evaluation of Outcomes: Adequate for Discharge   RN Treatment Plan for Primary Diagnosis: Major depressive disorder, single episode, severe with psychotic features ([place]) Long Term Goal(s): Knowledge of disease and therapeutic regimen to maintain health will improve  Short Term Goals: Ability to remain free from injury will improve, Ability to verbalize frustration and anger appropriately will improve, Ability to demonstrate self-control, Ability to participate in decision making will improve, Ability to verbalize feelings will improve, Ability to identify and develop effective coping behaviors will improve, and Compliance with prescribed medications will improve  Medication  Management: RN will administer medications as ordered by provider, will assess and evaluate patient's response and provide education to patient for prescribed medication. RN will report any adverse and/or side effects to prescribing provider.  Therapeutic Interventions: 1 on 1 counseling sessions, Psychoeducation, Medication administration, Evaluate responses to treatment, Monitor vital signs and CBGs as ordered, Perform/monitor CIWA, COWS, AIMS and Fall Risk screenings as ordered, Perform wound care treatments as ordered.  Evaluation of Outcomes: Adequate for Discharge   LCSW Treatment Plan for Primary Diagnosis: Major depressive disorder, single episode, severe with psychotic features ([place]) Long Term Goal(s): Safe transition to appropriate next level of care at discharge, Engage patient in therapeutic group addressing interpersonal concerns.  Short Term Goals: Engage patient in aftercare planning with referrals and resources, Increase social support, Increase ability to appropriately verbalize feelings, Increase emotional regulation, Facilitate acceptance of mental health diagnosis and concerns, and Increase skills for wellness and recovery  Therapeutic Interventions: Assess for all discharge needs, 1 to 1 time with Social worker, Explore available resources and support systems, Assess for adequacy in community support network, Educate family and significant other(s) on suicide prevention, Complete Psychosocial Assessment, Interpersonal group therapy.  Evaluation of Outcomes: Adequate for Discharge   Progress in Treatment: Attending groups: No. Participating in groups: No. Taking medication as prescribed: Yes. Toleration medication: Yes. Family/Significant other contact made: Yes, individual(s) contacted:  SPE completed with pt's spouse, Gabrelle Roca Patient understands diagnosis: Yes. Discussing patient identified problems/goals with staff: No. Medical problems stabilized or resolved:  Yes. Denies suicidal/homicidal ideation: Yes. Issues/concerns per patient self-inventory: No. Other: None  New problem(s) identified: No, Describe:  None  New Short Term/Long Term Goal(s): Patient to work towards  elimination of symptoms of psychosis, medication management for mood stabilization; elimination of SI thoughts; development of comprehensive mental wellness plan. Update 12/03/21: No changes at this time.      Patient Goals:  "want to feel back to myself" Update 12/03/21: No changes at this time.    Discharge Plan or Barriers: CSW will assist pt with development of appropriate discharge/aftercare plan. Update 12/03/21: No changes at this time.    Reason for Continuation of Hospitalization: Depression Hallucinations Medication stabilization   Estimated Length of Stay: 1-7 days  Last 3 [place] Suicide Severity Risk Score: Flowsheet Row Admission (Current) from 11/26/2021 in [facility] to Hosp-Admission (Discharged) from 11/23/2021 in [facility] to Hosp-Admission (Discharged) from 10/27/2021 in [facility]  No Risk No Risk No Risk       Last PHQ 2/9 Scores:    10/09/2021    4:55 PM  Depression screen PHQ 2/9  Decreased Interest   Down, Depressed, Hopeless   PHQ - 2 Score   Altered sleeping   Tired, decreased energy   Change in appetite   Feeling bad or failure about yourself    Trouble concentrating   Moving slowly or fidgety/restless   Suicidal thoughts   PHQ-9 Score      Information is confidential and restricted. Go to Review Flowsheets to unlock data.    Scribe for Treatment Team: Rebecca Motta A [place], [name] 12/03/2021 10:59 AM

## 2021-12-03 NOTE — Progress Notes (Signed)
  [facility] Adult Case Management Discharge Plan :  Will you be returning to the same living situation after discharge:  Yes,  pt will be returning home At discharge, do you have transportation home?: Yes,  pt's spouse will be providing transportation Do you have the ability to pay for your medications: Yes,  pt's has [facility]   Release of information consent forms completed and in the chart;  Patient's signature needed at discharge.  Patient to Follow up at:  Follow-up Information     [facility] Follow up.   Why: You have an appointment scheduled for follow up on Wednesday November 29th at 2:30PM. Please contact office to reschedule if needed. Thanks! Contact information: [address] [place]  [phone]                Next level of care provider has access to [place] and Suicide Prevention discussed: Yes,  SPE completed with pt's spouse Nelta Caudill     Has patient been referred to the Quitline?: N/A patient is not a smoker  Patient has been referred for addiction treatment: N/A  Kunaal Walkins A [place], [place] 12/03/2021, 9:37 AM

## 2021-12-03 NOTE — Plan of Care (Signed)
  Problem: Safety: Goal: Non-violent Restraint(s) Outcome: Completed/Met   Problem: Clinical Measurements: Goal: Will remain free from infection Outcome: Completed/Met   Problem: Nutrition: Goal: Adequate nutrition will be maintained Outcome: Completed/Met   Problem: Safety: Goal: Ability to remain free from injury will improve Outcome: Completed/Met

## 2021-12-03 NOTE — Progress Notes (Signed)
Patient ID: Danielle [name], female   DOB: [birth year], 76 y.o.   MRN: [id number]  Patient was discharged from [facility] unit at approx 1220 escorted by staff. Patient denies SI/HI/AVH. Discharge packet to include printed AVS, Suicide Risk Assessment, and Transition Record. This was reviewed with patient and reviewed with patient's husband. Belongings returned. Main items to include cell phone, cane, and eyeglasses. Suicide safety plan unable to be completed due to lack of cognitive ability. Copy of 'unable' still placed in chart. Patient given a PRN Zofran for c/o nausea at 1213. Family informed.

## 2021-12-03 NOTE — B[facility] Counselor (Incomplete)
CSW spoke with pt's husband regarding . Tell them she needs Neuropsychological testing with a Psychologist out-patient to help with diagnosis and rule out dementia or other psychiatric disorder. They can call insurance company for referral.

## 2021-12-08 ENCOUNTER — Telehealth: Payer: Self-pay | Admitting: Behavioral Health

## 2021-12-08 NOTE — Telephone Encounter (Signed)
Are you able to see the hospital notes?

## 2021-12-08 NOTE — Telephone Encounter (Signed)
Next appt is 12/10/21. [name], Danielle [name]'s spouse called and she was in the [facility] in [place] and has been discharged. Her husband said that things have gotten progressively worse with her medicines and other things. He is on her DPR to speak to. [phone]/

## 2021-12-09 ENCOUNTER — Encounter (HOSPITAL_COMMUNITY): Payer: Self-pay

## 2021-12-09 ENCOUNTER — Emergency Department (HOSPITAL_COMMUNITY): Payer: Medicare HMO

## 2021-12-09 ENCOUNTER — Emergency Department (HOSPITAL_COMMUNITY)
Admission: EM | Admit: 2021-12-09 | Discharge: 2021-12-09 | Disposition: A | Discharge status: Home / Self Care | Payer: Medicare HMO | Attending: Emergency Medicine | Admitting: Emergency Medicine

## 2021-12-09 ENCOUNTER — Other Ambulatory Visit: Payer: Self-pay

## 2021-12-09 DIAGNOSIS — Z79899 Other long term (current) drug therapy: Secondary | ICD-10-CM | POA: Diagnosis not present

## 2021-12-09 DIAGNOSIS — I959 Hypotension, unspecified: Secondary | ICD-10-CM | POA: Diagnosis not present

## 2021-12-09 DIAGNOSIS — R300 Dysuria: Secondary | ICD-10-CM | POA: Insufficient documentation

## 2021-12-09 DIAGNOSIS — M545 Low back pain, unspecified: Secondary | ICD-10-CM | POA: Diagnosis not present

## 2021-12-09 DIAGNOSIS — M25552 Pain in left hip: Secondary | ICD-10-CM | POA: Diagnosis not present

## 2021-12-09 DIAGNOSIS — M79652 Pain in left thigh: Secondary | ICD-10-CM | POA: Diagnosis not present

## 2021-12-09 DIAGNOSIS — T68XXXA Hypothermia, initial encounter: Secondary | ICD-10-CM | POA: Diagnosis not present

## 2021-12-09 DIAGNOSIS — R32 Unspecified urinary incontinence: Secondary | ICD-10-CM | POA: Insufficient documentation

## 2021-12-09 DIAGNOSIS — F29 Unspecified psychosis not due to a substance or known physiological condition: Secondary | ICD-10-CM | POA: Diagnosis not present

## 2021-12-09 DIAGNOSIS — Z471 Aftercare following joint replacement surgery: Secondary | ICD-10-CM | POA: Diagnosis not present

## 2021-12-09 DIAGNOSIS — R457 State of emotional shock and stress, unspecified: Secondary | ICD-10-CM | POA: Diagnosis not present

## 2021-12-09 DIAGNOSIS — Z96642 Presence of left artificial hip joint: Secondary | ICD-10-CM | POA: Diagnosis not present

## 2021-12-09 LAB — COMPREHENSIVE METABOLIC PANEL
ALT: 19 U/L (ref 0–44)
AST: 21 U/L (ref 15–41)
Albumin: 3.7 g/dL (ref 3.5–5.0)
Alkaline Phosphatase: 72 U/L (ref 38–126)
Anion gap: 9 (ref 5–15)
BUN: 13 mg/dL (ref 8–23)
CO2: 25 mmol/L (ref 22–32)
Calcium: 9.5 mg/dL (ref 8.9–10.3)
Chloride: 108 mmol/L (ref 98–111)
Creatinine, Ser: 0.96 mg/dL (ref 0.44–1.00)
GFR, Estimated: >60 mL/min (ref >60)
Glucose, Bld: 115 mg/dL — ABNORMAL HIGH (ref 70–99)
Potassium: 3.6 mmol/L (ref 3.5–5.1)
Sodium: 142 mmol/L (ref 135–145)
Total Bilirubin: 1.1 mg/dL (ref 0.3–1.2)
Total Protein: 6.4 g/dL — ABNORMAL LOW (ref 6.5–8.1)

## 2021-12-09 LAB — CBC WITH DIFFERENTIAL/PLATELET
Abs Immature Granulocytes: 0.02 10*3/uL (ref 0.00–0.07)
Basophils Absolute: 0.1 10*3/uL (ref 0.0–0.1)
Basophils Relative: 1 %
Eosinophils Absolute: 0.2 10*3/uL (ref 0.0–0.5)
Eosinophils Relative: 3 %
HCT: 38.5 % (ref 36.0–46.0)
Hemoglobin: 12.5 g/dL (ref 12.0–15.0)
Immature Granulocytes: 0 %
Lymphocytes Relative: 24 %
Lymphs Abs: 1.7 10*3/uL (ref 0.7–4.0)
MCH: 30.2 pg (ref 26.0–34.0)
MCHC: 32.5 g/dL (ref 30.0–36.0)
MCV: 93 fL (ref 80.0–100.0)
Monocytes Absolute: 0.8 10*3/uL (ref 0.1–1.0)
Monocytes Relative: 11 %
Neutro Abs: 4.3 10*3/uL (ref 1.7–7.7)
Neutrophils Relative %: 61 %
Platelets: 213 10*3/uL (ref 150–400)
RBC: 4.14 MIL/uL (ref 3.87–5.11)
RDW: 13.9 % (ref 11.5–15.5)
WBC: 7.1 10*3/uL (ref 4.0–10.5)
nRBC: 0 % (ref 0.0–0.2)

## 2021-12-09 LAB — URINALYSIS, ROUTINE W REFLEX MICROSCOPIC
Bilirubin Urine: NEGATIVE
Glucose, UA: NEGATIVE mg/dL
Hgb urine dipstick: NEGATIVE
Ketones, ur: NEGATIVE mg/dL
Nitrite: NEGATIVE
Protein, ur: NEGATIVE mg/dL
Specific Gravity, Urine: 1.004 — ABNORMAL LOW (ref 1.005–1.030)
pH: 5 (ref 5.0–8.0)

## 2021-12-09 LAB — LIPASE, BLOOD: Lipase: 29 U/L (ref 11–51)

## 2021-12-09 MED ORDER — ACETAMINOPHEN 500 MG PO TABS
1000.0000 mg | ORAL_TABLET | Freq: Once | ORAL | Status: AC
  Administered 2021-12-09: 1000 mg via ORAL
  Filled 2021-12-09: qty 2

## 2021-12-09 NOTE — Discharge Instructions (Addendum)
You have been evaluated for your symptoms.  Fortunately x-ray of your left hip did not show any broken bone or dislocation.  Your urine did not show any signs of infection.  It is important for you to follow-up closely with your primary care doctor tomorrow for further managements of your medication and your symptoms.  Please also follow-up with neurologist and psychiatrist as previously recommended.  Return to ER if you have any concern.

## 2021-12-09 NOTE — ED Triage Notes (Signed)
Pt BIB EMS from home for psychotic episodes initially. Pt stopped taking medications cold [place] and sleep meds have been making things worse per husband. Pt also c/o of back and abdominal pain along w/ urinary incontinence.  130/80 86 hr 95%

## 2021-12-09 NOTE — ED Provider Notes (Signed)
[facility] DEPT Provider Note   CSN: [id number] Arrival date & time: 12/09/21  9622     History  Chief Complaint  Patient presents with   Urinary Incontinence   Psychosis    Danielle [name] is a 76 y.o. female.  The history is provided by the patient and medical records. No language interpreter was used.     Patient is a 76 year old female presenting to ED with CC thigh, lower back pain and urinary incontinence. Patient reports pain began a few days ago and is localized to upper left thigh and lower back. Pain is exacerbated by flexion of left hip and knee. Patient used OTC analgesic patch with no relief. Patient is able to walk with walker. Reports pain is intermittent and occasionally occurs at rest. Patient also endorses frequent UTIs and urinary incontinence with unknown onset. Patient reports dysuria. Patient denies hematuria, melena, flank pain, pelvic pain, nausea, vomiting, diarrhea.  Home Medications Prior to Admission medications   Medication Sig Start Date End Date Taking? Authorizing Provider  CALCIUM-VITAMIN D PO Take 1 capsule by mouth daily. 1200-1000 calcium-vitamin d    [provider]  FLUoxetine (PROZAC) 20 MG capsule Take 1 capsule (20 mg total) by mouth daily. 12/04/21   Parks Ranger, DO  fluticasone [facility]) 50 MCG/ACT nasal spray Place 2 sprays into both nostrils daily.    [provider]  hydrOXYzine (ATARAX) 25 MG tablet Take 1 tablet (25 mg total) by mouth every 6 (six) hours as needed for anxiety. 12/03/21   Parks Ranger, DO  levothyroxine (SYNTHROID, LEVOTHROID) 137 MCG tablet Take 137 mcg by mouth daily before breakfast.    [provider]  Magnesium 100 MG CAPS Take 100 mg by mouth daily.    [provider]  metoprolol tartrate (LOPRESSOR) 25 MG tablet Take 0.5 tablets (12.5 mg total) by mouth daily at 8 pm. 12/03/21   Parks Ranger, DO  nitroGLYCERIN  (NITROSTAT) 0.4 MG SL tablet Place 0.4 mg under the tongue every 5 (five) minutes as needed for chest pain.    [provider]  ondansetron (ZOFRAN) 4 MG tablet Take 4 mg by mouth every 8 (eight) hours as needed for nausea or vomiting. 09/23/21   [provider]  QUEtiapine (SEROQUEL) 100 MG tablet Take 1 tablet (100 mg total) by mouth at bedtime. 12/03/21   Parks Ranger, DO  rosuvastatin (CRESTOR) 40 MG tablet Take 1 tablet (40 mg total) by mouth daily. 08/18/17   Skeet Latch, MD  senna-docusate (SENOKOT-S) 8.6-50 MG tablet Take 1 tablet by mouth at bedtime. 11/26/21   Raiford Noble Latif, DO  traZODone (DESYREL) 100 MG tablet Take 1 tablet (100 mg total) by mouth at bedtime as needed for sleep. 12/03/21   Parks Ranger, DO  Vitamin D, Ergocalciferol, (DRISDOL) 1.25 MG (50000 UNIT) CAPS capsule Take 50,000 Units by mouth every Wednesday. 07/01/21   [provider]      Allergies    Ceftin [cefuroxime axetil], Codeine, and Compazine [prochlorperazine]    Review of Systems   Review of Systems  All other systems reviewed and are negative.   Physical Exam Updated Vital Signs BP (!) 142/62 (BP Location: Right Arm)   Pulse 83   Temp (!) 97.5 F (36.4 C) (Oral)   Resp 16   Ht '5\' 3"'$  (1.6 m)   Wt 87.5 kg   SpO2 100%   BMI 34.19 kg/m  Physical Exam Vitals and nursing  note reviewed.  Constitutional:      General: She is not in acute distress.    Appearance: She is well-developed.  HENT:     Head: Atraumatic.  Eyes:     Conjunctiva/sclera: Conjunctivae normal.  Cardiovascular:     Rate and Rhythm: Normal rate and regular rhythm.     Pulses: Normal pulses.     Heart sounds: Normal heart sounds.  Pulmonary:     Effort: Pulmonary effort is normal.  Musculoskeletal:        General: Tenderness (Left lower extremity: Tenderness to proximal left thigh without any overlying skin changes.  Normal left hip range of motion.) present.      Cervical back: Neck supple.  Skin:    Findings: No rash.  Neurological:     Mental Status: She is alert.  Psychiatric:        Mood and Affect: Mood normal.        Thought Content: Thought content does not include homicidal or suicidal ideation.     ED Results / Procedures / Treatments   Labs (all labs ordered are listed, but only abnormal results are displayed) Labs Reviewed  COMPREHENSIVE METABOLIC PANEL - Abnormal; Notable for the following components:      Result Value   Glucose, Bld 115 (*)    Total Protein 6.4 (*)    All other components within normal limits  URINALYSIS, ROUTINE W REFLEX MICROSCOPIC - Abnormal; Notable for the following components:   Color, Urine STRAW (*)    Specific Gravity, Urine 1.004 (*)    Leukocytes,Ua SMALL (*)    Bacteria, UA RARE (*)    All other components within normal limits  CBC WITH DIFFERENTIAL/PLATELET  LIPASE, BLOOD    EKG None  Radiology DG Hip Unilat W or Wo Pelvis 2-3 Views Left  Result Date: 12/09/2021 CLINICAL DATA:  76 year old female with history of left hip pain. EXAM: DG HIP (WITH OR WITHOUT PELVIS) 2-3V LEFT COMPARISON:  10/27/2021. FINDINGS: Status post bilateral total hip arthroplasties. Prosthetic femoral heads project over the acetabular cups bilaterally. Dedicated views of the left hip confirm location of the left femoral head. No acute displaced fractures are noted. Bony pelvic ring appears intact. IMPRESSION: 1. No acute radiographic abnormality of the bony pelvis or the left hip. 2. Status post bilateral total hip arthroplasty. Electronically Signed   By: Vinnie Langton M.D.   On: 12/09/2021 08:20    Procedures Procedures    Medications Ordered in ED Medications  acetaminophen (TYLENOL) tablet 1,000 mg (1,000 mg Oral Given 12/09/21 0737)    ED Course/ Medical Decision Making/ A&P                           Medical Decision Making Amount and/or Complexity of Data Reviewed Labs: ordered. Radiology:  ordered.  Risk OTC drugs.   BP (!) 142/62 (BP Location: Right Arm)   Pulse 83   Temp (!) 97.5 F (36.4 C) (Oral)   Resp 16   Ht '5\' 3"'$  (1.6 m)   Wt 87.5 kg   SpO2 100%   BMI 34.19 kg/m   87:59 AM 76 year old female with significant history of pseudodementia, generalized anxiety disorder, recurrent urinary tract infection, diabetes, who presents with multiple complaints.  Patient brought here via EMS from home.  Per EMS note, patient was having psychotic episodes after she stopped taking her medication.  Patient however reports she is here because she is having increasing pain to  her left thigh.  States pain has been an ongoing issue for several weeks.  She endorsed increasing pain with movement but pain is present at rest.  She denies any injuries and states that she is currently taking medication prescribed by her doctor at home without adequate relief.  She also voiced concerns of having urinary tract infection.  States she is having increased urinary frequency and urgency for the past 2 weeks.  She also reported decrease in appetite.  She does not endorse any SI or HI.  She denies quitting taking her medication but states that her doctor is currently switching her medications around.  Patient however is a poor historian.  On exam, this is an obese female laying in bed moaning on occasion.  She points to her left thigh/hip region to indicate the location of her pain.  Heart sounds normal, abdomen is soft nontender, she does have 2+ peripheral edema noted to bilateral lower extremities with intact distal pulses.  She is alert and oriented x 2, mistakenly thought it was 2030 instead of 2023.  Workup initiated.  -Labs ordered, independently viewed and interpreted by me.  Labs remarkable for normal UA, no evidence of UTI.  Electrolytes panels are reassuring -The patient was maintained on a cardiac monitor.  I personally viewed and interpreted the cardiac monitored which showed an underlying rhythm  of: NSR -Imaging independently viewed and interpreted by me and I agree with radiologist's interpretation.  Result remarkable for Xray of L hip without acute fx/dislocation -This patient presents to the ED for concern of dysuria and L thigh pain, this involves an extensive number of treatment options, and is a complaint that carries with it a high risk of complications and morbidity.  The differential diagnosis includes UTI, pyelonephritis, colitis, hip fx, hip dislocation -Co morbidities that complicate the patient evaluation includes chronic UTI, pseudodementia -Treatment includes tylenol -Reevaluation of the patient after these medicines showed that the patient stayed the same -PCP office notes or outside notes reviewed -Escalation to admission/observation considered: patients prefers to go home -Prescription medication considered, patient comfortable with continue with home meds -Social Determinant of Health considered  10:18 AM Since patient complaining of pain to her left hip as well as dysuria, I have obtained basic labs, urinalysis, as well as x-ray of the left hip and pelvis.  X-ray did not show any acute changes, her labs are reassuring, her urinalysis without signs of UTI.  I was able to communicate with patient's husband who is at bedside.  Husband mention for the past 5 months patient has a steady decline in her mental health and is becoming more dependent on him.  She is having panic attack, she is acting not her normal self and despite discussing this with her primary care provider as well is with psychiatry and including previous hospital admission, symptoms seems to be poorly controlled.  He believes part of the problem is the medications that she is on.  She does have an appointment for her to be seen by her PCP tomorrow but he felt she is not being cared for appropriately.  I spent time discussing our evaluation on today's visit and expressed that there are no acute concerning finding  on today's exam.  I strongly encourage patient to follow-up with PCP along with psychiatry and neurology as previously scheduled for further managements of her chronic condition.  I have low suspicion for underlying infectious etiology causing her symptoms.  I suspect vascular dementia is a possibility causing her  change in mental status.  I have low suspicion for occult hip fracture as patient able to bear weight.  She normally walks with a walker with a cane.  Patient overall neurovascularly intact.  Return precaution was given.         Final Clinical Impression(s) / ED Diagnoses Final diagnoses:  Dysuria  Left hip pain    Rx / DC Orders ED Discharge Orders     None         Domenic Moras, PA-C 12/09/21 1021    Gareth Morgan, MD 12/12/21 1750

## 2021-12-10 ENCOUNTER — Ambulatory Visit (INDEPENDENT_AMBULATORY_CARE_PROVIDER_SITE_OTHER): Payer: Medicare HMO | Admitting: Behavioral Health

## 2021-12-10 DIAGNOSIS — R110 Nausea: Secondary | ICD-10-CM

## 2021-12-10 MED ORDER — ONDANSETRON HCL 4 MG PO TABS: 4.0000 mg | ORAL_TABLET | Freq: Three times a day (TID) | ORAL | 0 refills | Status: DC | PRN

## 2021-12-10 NOTE — Progress Notes (Deleted)
Follow-up recommendations: Neurology and psychiatry and neuropsychological testing to rule out dementia versus depression versus personality.  [facility] November 29.

## 2021-12-11 ENCOUNTER — Telehealth: Payer: Self-pay | Admitting: Behavioral Health

## 2021-12-11 NOTE — Telephone Encounter (Signed)
Pts' spouse came in needing help with trying to get Danielle [name] in for neurology or neuropsych appt that Lesle Chris recommended. For this type of service, pt will most likely need a referral . What would be the reason for referral and diagnosis so that we can complete a referral to send in. Also, any specific neurologist needed? We could try [facility] or [facility].

## 2021-12-12 ENCOUNTER — Telehealth: Payer: Self-pay | Admitting: Behavioral Health

## 2021-12-12 NOTE — Telephone Encounter (Signed)
Ansley at [facility] LVM @ 1:52p.  She said pt was here recently and Aaron Edelman took her off Lasix.  She want to know why.  Pls call her back at  [phone]

## 2021-12-12 NOTE — Telephone Encounter (Signed)
Called [facility] and told them that Lasix was stopped in the hospital, not by Aaron Edelman.

## 2021-12-15 ENCOUNTER — Encounter: Payer: Self-pay | Admitting: Behavioral Health

## 2021-12-15 NOTE — Progress Notes (Signed)
[facility] Med Check  Patient ID: Danielle [name],  MRN: [id number]  PCP: [name], MD  Date of Evaluation: 12/10/2021 Time spent:40 minutes  Chief Complaint:  Chief Complaint   Altered Mental Status; Anxiety; Depression; Follow-up; Medication Problem; Medication Refill; Patient Education; Family Problem; Memory Loss     HISTORY/CURRENT STATUS: HPI "Danielle [name]", 76 year old female presents to this office for follow up and medication management.  Her husband Ed and her son is present with her verbal consent. No crying as in last visit. She is calm and thoughts more collected. Ed appears tense and irritated. He is once again confused about all her meds and pulls out another gallon bag full of medications. Says they changed her meds again in the ER.  He said imaging did show vascular changes and notes reflected small vessel disease.  Husband says she continues to hallucinate at home and the Olanzapine did not help in the short time she was on the med. Says the ER stopped it.   States that her anxiety today is 5/10, and her depression is 6/10.  States that her sleep is broken and often wakes up several times per night.  Says she is trying to wear cpap more often.  Says also says that she sometimes sees images of people that are not present.  She denies auditory or command hallucinations.  Denies delirium. She has had chronic problems with UTI.  No SI or HI.    Patient cannot remember complete prior psychotropic medication trials: Prozac Pristiq Ativan Zoloft     Individual Medical History/ Review of Systems: Changes? :No   Allergies: Ceftin [cefuroxime axetil], Codeine, and Compazine [prochlorperazine]  Current Medications:  Current Outpatient Medications:    CALCIUM-VITAMIN D PO, Take 1 capsule by mouth daily. 1200-1000 calcium-vitamin d, Disp: , Rfl:    FLUoxetine (PROZAC) 20 MG capsule, Take 1 capsule (20 mg total) by mouth daily., Disp: 30 capsule, Rfl: 3    fluticasone (FLONASE) 50 MCG/ACT nasal spray, Place 2 sprays into both nostrils daily., Disp: , Rfl:    hydrOXYzine (ATARAX) 25 MG tablet, Take 1 tablet (25 mg total) by mouth every 6 (six) hours as needed for anxiety., Disp: 30 tablet, Rfl: 0   levothyroxine (SYNTHROID, LEVOTHROID) 137 MCG tablet, Take 137 mcg by mouth daily before breakfast., Disp: , Rfl:    Magnesium 100 MG CAPS, Take 100 mg by mouth daily., Disp: , Rfl:    metoprolol tartrate (LOPRESSOR) 25 MG tablet, Take 0.5 tablets (12.5 mg total) by mouth daily at 8 pm., Disp: 30 tablet, Rfl: 3   nitroGLYCERIN (NITROSTAT) 0.4 MG SL tablet, Place 0.4 mg under the tongue every 5 (five) minutes as needed for chest pain., Disp: , Rfl:    ondansetron (ZOFRAN) 4 MG tablet, Take 1 tablet (4 mg total) by mouth every 8 (eight) hours as needed for nausea or vomiting., Disp: 30 tablet, Rfl: 0   QUEtiapine (SEROQUEL) 100 MG tablet, Take 1 tablet (100 mg total) by mouth at bedtime., Disp: 30 tablet, Rfl: 3   rosuvastatin (CRESTOR) 40 MG tablet, Take 1 tablet (40 mg total) by mouth daily., Disp: 90 tablet, Rfl: 3   senna-docusate (SENOKOT-S) 8.6-50 MG tablet, Take 1 tablet by mouth at bedtime., Disp: , Rfl:    traZODone (DESYREL) 100 MG tablet, Take 1 tablet (100 mg total) by mouth at bedtime as needed for sleep., Disp: 30 tablet, Rfl: 3   Vitamin D, Ergocalciferol, (DRISDOL) 1.25 MG (50000 UNIT) CAPS capsule, Take 50,000 Units by mouth  every Wednesday., Disp: , Rfl:  Medication Side Effects: none  Family Medical/ Social History: Changes? No  MENTAL HEALTH EXAM:  There were no vitals taken for this visit.There is no height or weight on file to calculate BMI.  General Appearance: Casual, Neat, Well Groomed, and wheelchair  Eye Contact:  Good  Speech:  Clear and Coherent  Volume:  Normal  Mood:  Depressed and Dysphoric  Affect:  Congruent and Depressed  Thought Process:  Coherent  Orientation:  Full (Time, Place, and Person)  Thought Content:  Logical   Suicidal Thoughts:  No  Homicidal Thoughts:  No  Memory:  WNL  Judgement:  Fair  Insight:  Good and Fair  Psychomotor Activity:  Normal  Concentration:  Concentration: Fair  Recall:  [place] of Knowledge: Fair  Language: Fair  Assets:  Desire for Improvement Physical Health Resilience Social Support  ADL's:  Impaired  Cognition: Impaired,  Moderate  Prognosis:  Poor    DIAGNOSES:    ICD-10-CM   1. Nausea without vomiting  R11.0 ondansetron (ZOFRAN) 4 MG tablet      Receiving Psychotherapy: No    RECOMMENDATIONS:   Greater than 50% of  40  min face to face time with patient was spent on counseling and coordination of care.  Her husband is present again with her today. She is in wheelchair. He appears distressed and angry. As in the last two visits, he is once again confused about her medications that he helps with administration. Says that he cannot get any help from his PCP helping him understand what meds she is taking. Says they changed patients medications again on visit to ER on 11/15 for altered mental status.  He hands me another gallon baggy of medications and says I do not know what to do. This is the third time helping with non psychotropic medications and explaining why and what she takes. The  Zoloft which was stopped by MD in last ER visit was still being given to patient PRN sometime multiple doses.  I asked to dispose of medication and labeled all other medications with what they were for.  I understand that he is having classic care giver role strain which is complicated her care.  She is not as foggy today and is calm. She is upset that her husband is reacting. BH provided dx of pseudo dementia. Per note:  Follow-up recommendations: Neurology and psychiatry and neuropsychological testing to rule out dementia versus depression versus personality.     I concur with this recommendation.   Referral to neuro psych to be provided. Previously AP were not  improving her condition. Until further testing or follow up with neurology I do not feel comfortable with prescribing another medication other than maybe gradually increasing her Prozac for depression.  I feel that due to advanced age and condition, she may needed higher level of care.  I agreed to follow up after they consulted neurology with ER recommendations.   We agreed:   ER stopped Zoloft  ER stopped Olanzapine ER stopped Mirtazapine To continue with hydroxyzine 25 mg tablet every 6 hours as needed To continue Prozac 20 mg capsule daily To continue Seroquel 100 mg tablet at bedtime To continue Trazodone 100 mg at bedtime   Instructed patient that she must utilize C-Pap when sleeping.  Pt stopped Ativan Pt stopped Lamictal  To report worsening symptoms or side effects promptly Provided emergency contact information Discussed caregiver role strain with husband and discussed him asking  for assistance from his son.  To follow-up in after neurology and neuropsych referral to reassess progress Discussed potential metabolic side effects associated with atypical antipsychotics, as well as potential risk for movement side effects. Advised pt to contact office if movement side effects occur.   Discussed potential benefits, risk, and side effects of benzodiazepines to include potential risk of tolerance and dependence, as well as possible drowsiness.  Advised patient not to drive if experiencing drowsiness and to take lowest possible effective dose to minimize risk of dependence and tolerance.  Reviewed PDMP     Elwanda Brooklyn, NP

## 2021-12-17 ENCOUNTER — Telehealth: Payer: Self-pay | Admitting: Behavioral Health

## 2021-12-17 NOTE — Telephone Encounter (Signed)
Spoke with Danielle [name] today regarding referral to neurology. I spoke with him to tell him that Danielle [name] would be conferring with Dr. Jaynee [facility] at [facility] to get her recommendations. He reports that her medications aren't helping. Right now, anxiety and agitation are the worst symptoms. He believes he is giving her the correct meds at this time.

## 2021-12-18 DIAGNOSIS — M8088XA Other osteoporosis with current pathological fracture, vertebra(e), initial encounter for fracture: Secondary | ICD-10-CM | POA: Diagnosis not present

## 2021-12-18 DIAGNOSIS — M8000XA Age-related osteoporosis with current pathological fracture, unspecified site, initial encounter for fracture: Secondary | ICD-10-CM | POA: Diagnosis not present

## 2021-12-18 DIAGNOSIS — Z0000 Encounter for general adult medical examination without abnormal findings: Secondary | ICD-10-CM | POA: Diagnosis not present

## 2021-12-18 DIAGNOSIS — N3289 Other specified disorders of bladder: Secondary | ICD-10-CM | POA: Diagnosis not present

## 2021-12-18 DIAGNOSIS — F3341 Major depressive disorder, recurrent, in partial remission: Secondary | ICD-10-CM | POA: Diagnosis not present

## 2021-12-18 DIAGNOSIS — I5189 Other ill-defined heart diseases: Secondary | ICD-10-CM | POA: Diagnosis not present

## 2021-12-18 DIAGNOSIS — E1169 Type 2 diabetes mellitus with other specified complication: Secondary | ICD-10-CM | POA: Diagnosis not present

## 2021-12-18 DIAGNOSIS — Z23 Encounter for immunization: Secondary | ICD-10-CM | POA: Diagnosis not present

## 2021-12-22 DIAGNOSIS — M5459 Other low back pain: Secondary | ICD-10-CM | POA: Diagnosis not present

## 2021-12-22 DIAGNOSIS — M5451 Vertebrogenic low back pain: Secondary | ICD-10-CM | POA: Diagnosis not present

## 2021-12-23 ENCOUNTER — Telehealth: Payer: Self-pay | Admitting: Behavioral Health

## 2021-12-23 ENCOUNTER — Other Ambulatory Visit: Payer: Self-pay | Admitting: Specialist

## 2021-12-23 DIAGNOSIS — M5459 Other low back pain: Secondary | ICD-10-CM

## 2021-12-23 DIAGNOSIS — M8080XA Other osteoporosis with current pathological fracture, unspecified site, initial encounter for fracture: Secondary | ICD-10-CM

## 2021-12-23 NOTE — Telephone Encounter (Signed)
Can you confer with clinical staff to have them contact him with info. I don't know enough to refer him.

## 2021-12-23 NOTE — Telephone Encounter (Signed)
Pt's spouse, Ed, came into office this am and office manager spoke with him. He is very upset that he can't get help with his wife. She is a total wreck, meds are not working, nothing is helping. She is seeing people that aren't there and talking to them. He would like something to be done to help her symptoms. Discussed that she may need hospital to get her stabilized?  Please call him asap. ( See Dec 6 note- not sure if Dr Lavell Anchors was consulted yet).

## 2021-12-24 DIAGNOSIS — R442 Other hallucinations: Secondary | ICD-10-CM | POA: Diagnosis not present

## 2021-12-24 DIAGNOSIS — R509 Fever, unspecified: Secondary | ICD-10-CM | POA: Diagnosis not present

## 2021-12-24 DIAGNOSIS — M549 Dorsalgia, unspecified: Secondary | ICD-10-CM | POA: Diagnosis not present

## 2021-12-24 DIAGNOSIS — N3000 Acute cystitis without hematuria: Secondary | ICD-10-CM | POA: Diagnosis not present

## 2021-12-24 DIAGNOSIS — I10 Essential (primary) hypertension: Secondary | ICD-10-CM | POA: Diagnosis not present

## 2021-12-24 DIAGNOSIS — M47817 Spondylosis without myelopathy or radiculopathy, lumbosacral region: Secondary | ICD-10-CM | POA: Diagnosis not present

## 2021-12-24 DIAGNOSIS — E119 Type 2 diabetes mellitus without complications: Secondary | ICD-10-CM | POA: Diagnosis not present

## 2021-12-24 DIAGNOSIS — S32030A Wedge compression fracture of third lumbar vertebra, initial encounter for closed fracture: Secondary | ICD-10-CM | POA: Diagnosis not present

## 2021-12-24 DIAGNOSIS — S32039A Unspecified fracture of third lumbar vertebra, initial encounter for closed fracture: Secondary | ICD-10-CM | POA: Diagnosis not present

## 2021-12-24 DIAGNOSIS — M4856XA Collapsed vertebra, not elsewhere classified, lumbar region, initial encounter for fracture: Secondary | ICD-10-CM | POA: Diagnosis not present

## 2021-12-25 ENCOUNTER — Encounter: Payer: Self-pay | Admitting: Specialist

## 2021-12-25 ENCOUNTER — Ambulatory Visit (INDEPENDENT_AMBULATORY_CARE_PROVIDER_SITE_OTHER): Payer: Medicare HMO | Admitting: Behavioral Health

## 2021-12-25 ENCOUNTER — Encounter: Payer: Self-pay | Admitting: Behavioral Health

## 2021-12-25 DIAGNOSIS — F333 Major depressive disorder, recurrent, severe with psychotic symptoms: Secondary | ICD-10-CM | POA: Diagnosis not present

## 2021-12-25 DIAGNOSIS — R443 Hallucinations, unspecified: Secondary | ICD-10-CM | POA: Diagnosis not present

## 2021-12-25 DIAGNOSIS — F411 Generalized anxiety disorder: Secondary | ICD-10-CM

## 2021-12-25 DIAGNOSIS — F410 Panic disorder [episodic paroxysmal anxiety] without agoraphobia: Secondary | ICD-10-CM

## 2021-12-25 DIAGNOSIS — F5105 Insomnia due to other mental disorder: Secondary | ICD-10-CM

## 2021-12-25 DIAGNOSIS — F99 Mental disorder, not otherwise specified: Secondary | ICD-10-CM

## 2021-12-25 MED ORDER — CARIPRAZINE HCL 1.5 MG PO CAPS: 1.5000 mg | ORAL_CAPSULE | Freq: Every day | ORAL | 1 refills | Status: DC

## 2021-12-25 MED ORDER — HYDROXYZINE HCL 25 MG PO TABS: 25.0000 mg | ORAL_TABLET | Freq: Four times a day (QID) | ORAL | 2 refills | Status: DC | PRN

## 2021-12-25 MED ORDER — QUETIAPINE FUMARATE 100 MG PO TABS: 100.0000 mg | ORAL_TABLET | Freq: Every day | ORAL | 3 refills | Status: DC

## 2021-12-25 MED ORDER — FLUOXETINE HCL 40 MG PO CAPS: 40.0000 mg | ORAL_CAPSULE | Freq: Every day | ORAL | 1 refills | Status: DC

## 2021-12-25 NOTE — Progress Notes (Signed)
[facility]  Patient ID: Britteney Ayotte,  MRN: [id number]  PCP: [name], MD  Date of Evaluation: 12/25/2021 Time spent:40 minutes  Chief Complaint:  Chief Complaint   Follow-up; Anxiety; Depression; Altered Mental Status; Medication Problem; Patient Education; Stress     HISTORY/CURRENT STATUS: HPI "[name]", 76 year old female presents to this office for follow up and medication management.  Her husband Ed is present with her verbal consent. No crying as in last visit. She is calm and thoughts more collected. Ed appears tense and irritated. Ed says she was hallucinating and acting strange again so he took her back to the ED on 12/13. It was determined she had another UTI. They are frustrated at the frequency that she get them and complicated all of her care. They are requesting another trial of medication that may help with her chronic depression.  States that her anxiety today is 7/10, and her depression is 7/10.  States that her sleep is broken and often wakes up several times per night. She has been unable to wear cpap due to respiratory symptoms. Says she is trying to wear cpap more often.  She denies auditory or command hallucinations.  Recent delirium due to UTI.  No SI or HI.    Patient cannot remember complete prior psychotropic medication trials: Prozac Pristiq Ativan Zoloft Individual Medical History/ Review of Systems: Changes? :No   Allergies: Ceftin [cefuroxime axetil], Codeine, and Compazine [prochlorperazine]  Current Medications:  Current Outpatient Medications:    cariprazine (VRAYLAR) 1.5 MG capsule, Take 1 capsule (1.5 mg total) by mouth daily., Disp: 30 capsule, Rfl: 1   FLUoxetine (PROZAC) 40 MG capsule, Take 1 capsule (40 mg total) by mouth daily., Disp: 30 capsule, Rfl: 1   CALCIUM-VITAMIN D PO, Take 1 capsule by mouth daily. 1200-1000 calcium-vitamin d, Disp: , Rfl:    fluticasone (FLONASE) 50 MCG/ACT nasal spray, Place 2 sprays  into both nostrils daily., Disp: , Rfl:    hydrOXYzine (ATARAX) 25 MG tablet, Take 1 tablet (25 mg total) by mouth every 6 (six) hours as needed for anxiety., Disp: 30 tablet, Rfl: 2   levothyroxine (SYNTHROID, LEVOTHROID) 137 MCG tablet, Take 137 mcg by mouth daily before breakfast., Disp: , Rfl:    Magnesium 100 MG CAPS, Take 100 mg by mouth daily., Disp: , Rfl:    metoprolol tartrate (LOPRESSOR) 25 MG tablet, Take 0.5 tablets (12.5 mg total) by mouth daily at 8 pm., Disp: 30 tablet, Rfl: 3   nitroGLYCERIN (NITROSTAT) 0.4 MG SL tablet, Place 0.4 mg under the tongue every 5 (five) minutes as needed for chest pain., Disp: , Rfl:    ondansetron (ZOFRAN) 4 MG tablet, Take 1 tablet (4 mg total) by mouth every 8 (eight) hours as needed for nausea or vomiting., Disp: 30 tablet, Rfl: 0   QUEtiapine (SEROQUEL) 100 MG tablet, Take 1 tablet (100 mg total) by mouth at bedtime., Disp: 30 tablet, Rfl: 3   rosuvastatin (CRESTOR) 40 MG tablet, Take 1 tablet (40 mg total) by mouth daily., Disp: 90 tablet, Rfl: 3   senna-docusate (SENOKOT-S) 8.6-50 MG tablet, Take 1 tablet by mouth at bedtime., Disp: , Rfl:    traZODone (DESYREL) 100 MG tablet, Take 1 tablet (100 mg total) by mouth at bedtime as needed for sleep., Disp: 30 tablet, Rfl: 3   Vitamin D, Ergocalciferol, (DRISDOL) 1.25 MG (50000 UNIT) CAPS capsule, Take 50,000 Units by mouth every Wednesday., Disp: , Rfl:  Medication Side Effects: none  Family Medical/ Social History:  Changes? No  MENTAL HEALTH EXAM:  There were no vitals taken for this visit.There is no height or weight on file to calculate BMI.  General Appearance: Casual, Neat, and Well Groomed  Eye Contact:  Good  Speech:  Clear and Coherent  Volume:  Decreased  Mood:  Anxious, Depressed, and Dysphoric  Affect:  Depressed, Flat, and Anxious  Thought Process:  Coherent  Orientation:  Full (Time, Place, and Person)  Thought Content: Logical   Suicidal Thoughts:  No  Homicidal Thoughts:  No   Memory:  WNL  Judgement:  Fair  Insight:  Fair  Psychomotor Activity:  Normal  Concentration:  Concentration: Good  Recall:  [place] of Knowledge: Fair  Language: Fair  Assets:  Desire for Improvement Physical Health Resilience Social Support  ADL's:  Impaired  Cognition: Impaired,  Mild  Prognosis:  Fair    DIAGNOSES:    ICD-10-CM   1. Severe episode of recurrent major depressive disorder, with psychotic features (HCC)  F33.3 FLUoxetine (PROZAC) 40 MG capsule    cariprazine (VRAYLAR) 1.5 MG capsule    QUEtiapine (SEROQUEL) 100 MG tablet    2. Hallucinations  R44.3 cariprazine (VRAYLAR) 1.5 MG capsule    QUEtiapine (SEROQUEL) 100 MG tablet    3. Panic attack  F41.0 FLUoxetine (PROZAC) 40 MG capsule    4. Insomnia due to other mental disorder  F51.05    F99     5. Generalized anxiety disorder  F41.1 FLUoxetine (PROZAC) 40 MG capsule    cariprazine (VRAYLAR) 1.5 MG capsule    hydrOXYzine (ATARAX) 25 MG tablet      Receiving Psychotherapy: No    RECOMMENDATIONS:   Greater than 50% of  40  min face to face time with patient was spent on counseling and coordination of care.  Her husband is present again with her today. She is in wheelchair. He appears distressed and angry again.  We discussed her ED visit yesterday positive for another UTI. We talked about plan of care and patient feeling like she cannot get any help with her condition.  I reinforced my concerns about previous medications not helping, and confusion with medication administration. I reviewed her medications again and explained their purpose, dose, and timing of administration.  I explained that I would prescribe another medication to see if it would help. With the previous dx of pseudo dementia and no other known organic causes, I would try another partial dopamine agonist.   We agreed:     Instructed patient that she must utilize C-Pap when sleeping.   To increase Prozac to 40 mg daily To start  Vraylar 1.5 mg daily To continue Hydroxyzine 25 mg three times daily as needed for anxiety To continue Seroquel 100 mg tablet at bedtime To continue Trazodone 100 mg at bedtime  To report worsening symptoms or side effects promptly Provided emergency contact information Discussed caregiver role strain with husband and discussed him asking for assistance from his son.  To follow-up with neurology to reassess progress Discussed potential metabolic side effects associated with atypical antipsychotics, as well as potential risk for movement side effects. Advised pt to contact office if movement side effects occur.   Discussed potential benefits, risk, and side effects of benzodiazepines to include potential risk of tolerance and dependence, as well as possible drowsiness.  Advised patient not to drive if experiencing drowsiness and to take lowest possible effective dose to minimize risk of dependence and tolerance.  Reviewed PDMP    Aaron Edelman  Collene Leyden, NP

## 2021-12-29 ENCOUNTER — Inpatient Hospital Stay (HOSPITAL_COMMUNITY)
Admission: EM | Admit: 2021-12-29 | Discharge: 2022-01-28 | DRG: 516 | Disposition: A | Discharge status: Skilled Nursing Facility | Payer: Medicare HMO | Attending: Family Medicine | Admitting: Family Medicine

## 2021-12-29 ENCOUNTER — Emergency Department (HOSPITAL_COMMUNITY): Payer: Medicare HMO

## 2021-12-29 ENCOUNTER — Encounter (HOSPITAL_COMMUNITY): Payer: Self-pay

## 2021-12-29 DIAGNOSIS — E1169 Type 2 diabetes mellitus with other specified complication: Secondary | ICD-10-CM | POA: Diagnosis not present

## 2021-12-29 DIAGNOSIS — Z6834 Body mass index (BMI) 34.0-34.9, adult: Secondary | ICD-10-CM | POA: Diagnosis not present

## 2021-12-29 DIAGNOSIS — E669 Obesity, unspecified: Secondary | ICD-10-CM | POA: Diagnosis present

## 2021-12-29 DIAGNOSIS — I4510 Unspecified right bundle-branch block: Secondary | ICD-10-CM | POA: Diagnosis present

## 2021-12-29 DIAGNOSIS — F3341 Major depressive disorder, recurrent, in partial remission: Secondary | ICD-10-CM | POA: Diagnosis not present

## 2021-12-29 DIAGNOSIS — F03918 Unspecified dementia, unspecified severity, with other behavioral disturbance: Secondary | ICD-10-CM | POA: Diagnosis not present

## 2021-12-29 DIAGNOSIS — R1013 Epigastric pain: Secondary | ICD-10-CM | POA: Diagnosis present

## 2021-12-29 DIAGNOSIS — E1122 Type 2 diabetes mellitus with diabetic chronic kidney disease: Secondary | ICD-10-CM | POA: Diagnosis present

## 2021-12-29 DIAGNOSIS — Z515 Encounter for palliative care: Secondary | ICD-10-CM | POA: Diagnosis not present

## 2021-12-29 DIAGNOSIS — G959 Disease of spinal cord, unspecified: Secondary | ICD-10-CM | POA: Diagnosis not present

## 2021-12-29 DIAGNOSIS — N179 Acute kidney failure, unspecified: Secondary | ICD-10-CM | POA: Diagnosis present

## 2021-12-29 DIAGNOSIS — E7800 Pure hypercholesterolemia, unspecified: Secondary | ICD-10-CM | POA: Diagnosis present

## 2021-12-29 DIAGNOSIS — E039 Hypothyroidism, unspecified: Secondary | ICD-10-CM | POA: Diagnosis present

## 2021-12-29 DIAGNOSIS — I129 Hypertensive chronic kidney disease with stage 1 through stage 4 chronic kidney disease, or unspecified chronic kidney disease: Secondary | ICD-10-CM | POA: Diagnosis present

## 2021-12-29 DIAGNOSIS — F0392 Unspecified dementia, unspecified severity, with psychotic disturbance: Secondary | ICD-10-CM | POA: Diagnosis present

## 2021-12-29 DIAGNOSIS — Z803 Family history of malignant neoplasm of breast: Secondary | ICD-10-CM

## 2021-12-29 DIAGNOSIS — M4856XA Collapsed vertebra, not elsewhere classified, lumbar region, initial encounter for fracture: Secondary | ICD-10-CM | POA: Diagnosis not present

## 2021-12-29 DIAGNOSIS — R9431 Abnormal electrocardiogram [ECG] [EKG]: Secondary | ICD-10-CM | POA: Diagnosis not present

## 2021-12-29 DIAGNOSIS — I10 Essential (primary) hypertension: Secondary | ICD-10-CM | POA: Diagnosis present

## 2021-12-29 DIAGNOSIS — S32030D Wedge compression fracture of third lumbar vertebra, subsequent encounter for fracture with routine healing: Secondary | ICD-10-CM | POA: Diagnosis not present

## 2021-12-29 DIAGNOSIS — Z801 Family history of malignant neoplasm of trachea, bronchus and lung: Secondary | ICD-10-CM

## 2021-12-29 DIAGNOSIS — Z79890 Hormone replacement therapy (postmenopausal): Secondary | ICD-10-CM

## 2021-12-29 DIAGNOSIS — F0394 Unspecified dementia, unspecified severity, with anxiety: Secondary | ICD-10-CM | POA: Diagnosis present

## 2021-12-29 DIAGNOSIS — F418 Other specified anxiety disorders: Secondary | ICD-10-CM | POA: Diagnosis not present

## 2021-12-29 DIAGNOSIS — E1139 Type 2 diabetes mellitus with other diabetic ophthalmic complication: Secondary | ICD-10-CM | POA: Diagnosis not present

## 2021-12-29 DIAGNOSIS — Z9989 Dependence on other enabling machines and devices: Secondary | ICD-10-CM | POA: Diagnosis not present

## 2021-12-29 DIAGNOSIS — F419 Anxiety disorder, unspecified: Secondary | ICD-10-CM | POA: Diagnosis not present

## 2021-12-29 DIAGNOSIS — E876 Hypokalemia: Secondary | ICD-10-CM | POA: Diagnosis present

## 2021-12-29 DIAGNOSIS — N390 Urinary tract infection, site not specified: Secondary | ICD-10-CM | POA: Diagnosis present

## 2021-12-29 DIAGNOSIS — K219 Gastro-esophageal reflux disease without esophagitis: Secondary | ICD-10-CM | POA: Diagnosis present

## 2021-12-29 DIAGNOSIS — Z885 Allergy status to narcotic agent status: Secondary | ICD-10-CM

## 2021-12-29 DIAGNOSIS — M8008XA Age-related osteoporosis with current pathological fracture, vertebra(e), initial encounter for fracture: Secondary | ICD-10-CM | POA: Diagnosis present

## 2021-12-29 DIAGNOSIS — Z951 Presence of aortocoronary bypass graft: Secondary | ICD-10-CM

## 2021-12-29 DIAGNOSIS — F329 Major depressive disorder, single episode, unspecified: Secondary | ICD-10-CM | POA: Diagnosis present

## 2021-12-29 DIAGNOSIS — F32A Depression, unspecified: Secondary | ICD-10-CM | POA: Diagnosis not present

## 2021-12-29 DIAGNOSIS — R4589 Other symptoms and signs involving emotional state: Secondary | ICD-10-CM | POA: Diagnosis not present

## 2021-12-29 DIAGNOSIS — M4804 Spinal stenosis, thoracic region: Secondary | ICD-10-CM | POA: Diagnosis present

## 2021-12-29 DIAGNOSIS — E785 Hyperlipidemia, unspecified: Secondary | ICD-10-CM | POA: Diagnosis present

## 2021-12-29 DIAGNOSIS — F29 Unspecified psychosis not due to a substance or known physiological condition: Secondary | ICD-10-CM | POA: Diagnosis not present

## 2021-12-29 DIAGNOSIS — R296 Repeated falls: Secondary | ICD-10-CM | POA: Diagnosis present

## 2021-12-29 DIAGNOSIS — Z7189 Other specified counseling: Secondary | ICD-10-CM | POA: Diagnosis not present

## 2021-12-29 DIAGNOSIS — Z880 Allergy status to penicillin: Secondary | ICD-10-CM

## 2021-12-29 DIAGNOSIS — R4189 Other symptoms and signs involving cognitive functions and awareness: Secondary | ICD-10-CM | POA: Diagnosis not present

## 2021-12-29 DIAGNOSIS — Z7401 Bed confinement status: Secondary | ICD-10-CM | POA: Diagnosis not present

## 2021-12-29 DIAGNOSIS — M6281 Muscle weakness (generalized): Secondary | ICD-10-CM | POA: Diagnosis not present

## 2021-12-29 DIAGNOSIS — M1990 Unspecified osteoarthritis, unspecified site: Secondary | ICD-10-CM | POA: Diagnosis not present

## 2021-12-29 DIAGNOSIS — S32000A Wedge compression fracture of unspecified lumbar vertebra, initial encounter for closed fracture: Secondary | ICD-10-CM | POA: Diagnosis present

## 2021-12-29 DIAGNOSIS — I2510 Atherosclerotic heart disease of native coronary artery without angina pectoris: Secondary | ICD-10-CM | POA: Diagnosis present

## 2021-12-29 DIAGNOSIS — R4182 Altered mental status, unspecified: Secondary | ICD-10-CM | POA: Diagnosis present

## 2021-12-29 DIAGNOSIS — R0902 Hypoxemia: Secondary | ICD-10-CM | POA: Diagnosis present

## 2021-12-29 DIAGNOSIS — F0390 Unspecified dementia without behavioral disturbance: Secondary | ICD-10-CM | POA: Diagnosis not present

## 2021-12-29 DIAGNOSIS — M5030 Other cervical disc degeneration, unspecified cervical region: Secondary | ICD-10-CM | POA: Diagnosis present

## 2021-12-29 DIAGNOSIS — J9691 Respiratory failure, unspecified with hypoxia: Secondary | ICD-10-CM | POA: Diagnosis not present

## 2021-12-29 DIAGNOSIS — R442 Other hallucinations: Secondary | ICD-10-CM | POA: Diagnosis not present

## 2021-12-29 DIAGNOSIS — N1831 Chronic kidney disease, stage 3a: Secondary | ICD-10-CM | POA: Diagnosis present

## 2021-12-29 DIAGNOSIS — R509 Fever, unspecified: Secondary | ICD-10-CM | POA: Diagnosis not present

## 2021-12-29 DIAGNOSIS — Z0389 Encounter for observation for other suspected diseases and conditions ruled out: Secondary | ICD-10-CM | POA: Diagnosis not present

## 2021-12-29 DIAGNOSIS — Z9181 History of falling: Secondary | ICD-10-CM

## 2021-12-29 DIAGNOSIS — F6811 Factitious disorder with predominantly psychological signs and symptoms: Secondary | ICD-10-CM | POA: Diagnosis not present

## 2021-12-29 DIAGNOSIS — G4733 Obstructive sleep apnea (adult) (pediatric): Secondary | ICD-10-CM | POA: Diagnosis present

## 2021-12-29 DIAGNOSIS — Z881 Allergy status to other antibiotic agents status: Secondary | ICD-10-CM

## 2021-12-29 DIAGNOSIS — F0393 Unspecified dementia, unspecified severity, with mood disturbance: Secondary | ICD-10-CM | POA: Diagnosis present

## 2021-12-29 DIAGNOSIS — H409 Unspecified glaucoma: Secondary | ICD-10-CM | POA: Diagnosis present

## 2021-12-29 DIAGNOSIS — Z981 Arthrodesis status: Secondary | ICD-10-CM

## 2021-12-29 DIAGNOSIS — F02811 Dementia in other diseases classified elsewhere, unspecified severity, with agitation: Secondary | ICD-10-CM | POA: Diagnosis present

## 2021-12-29 DIAGNOSIS — Z888 Allergy status to other drugs, medicaments and biological substances status: Secondary | ICD-10-CM

## 2021-12-29 DIAGNOSIS — Z79899 Other long term (current) drug therapy: Secondary | ICD-10-CM

## 2021-12-29 DIAGNOSIS — Z9049 Acquired absence of other specified parts of digestive tract: Secondary | ICD-10-CM

## 2021-12-29 DIAGNOSIS — R609 Edema, unspecified: Secondary | ICD-10-CM | POA: Diagnosis not present

## 2021-12-29 DIAGNOSIS — M549 Dorsalgia, unspecified: Secondary | ICD-10-CM | POA: Diagnosis present

## 2021-12-29 DIAGNOSIS — Z90710 Acquired absence of both cervix and uterus: Secondary | ICD-10-CM

## 2021-12-29 DIAGNOSIS — Z96643 Presence of artificial hip joint, bilateral: Secondary | ICD-10-CM | POA: Diagnosis present

## 2021-12-29 LAB — COMPREHENSIVE METABOLIC PANEL
ALT: 15 U/L (ref 0–44)
AST: 18 U/L (ref 15–41)
Albumin: 3.8 g/dL (ref 3.5–5.0)
Alkaline Phosphatase: 71 U/L (ref 38–126)
Anion gap: 9 (ref 5–15)
BUN: 8 mg/dL (ref 8–23)
CO2: 24 mmol/L (ref 22–32)
Calcium: 9.7 mg/dL (ref 8.9–10.3)
Chloride: 109 mmol/L (ref 98–111)
Creatinine, Ser: 0.8 mg/dL (ref 0.44–1.00)
GFR, Estimated: >60 mL/min (ref >60)
Glucose, Bld: 91 mg/dL (ref 70–99)
Potassium: 3.5 mmol/L (ref 3.5–5.1)
Sodium: 142 mmol/L (ref 135–145)
Total Bilirubin: 0.9 mg/dL (ref 0.3–1.2)
Total Protein: 6.7 g/dL (ref 6.5–8.1)

## 2021-12-29 LAB — CBC WITH DIFFERENTIAL/PLATELET
Abs Immature Granulocytes: 0.02 10*3/uL (ref 0.00–0.07)
Basophils Absolute: 0 10*3/uL (ref 0.0–0.1)
Basophils Relative: 0 %
Eosinophils Absolute: 0.5 10*3/uL (ref 0.0–0.5)
Eosinophils Relative: 7 %
HCT: 39.2 % (ref 36.0–46.0)
Hemoglobin: 12.7 g/dL (ref 12.0–15.0)
Immature Granulocytes: 0 %
Lymphocytes Relative: 21 %
Lymphs Abs: 1.6 10*3/uL (ref 0.7–4.0)
MCH: 30.1 pg (ref 26.0–34.0)
MCHC: 32.4 g/dL (ref 30.0–36.0)
MCV: 92.9 fL (ref 80.0–100.0)
Monocytes Absolute: 0.7 10*3/uL (ref 0.1–1.0)
Monocytes Relative: 9 %
Neutro Abs: 4.7 10*3/uL (ref 1.7–7.7)
Neutrophils Relative %: 63 %
Platelets: 242 10*3/uL (ref 150–400)
RBC: 4.22 MIL/uL (ref 3.87–5.11)
RDW: 13.6 % (ref 11.5–15.5)
WBC: 7.5 10*3/uL (ref 4.0–10.5)
nRBC: 0 % (ref 0.0–0.2)

## 2021-12-29 LAB — RAPID URINE DRUG SCREEN, HOSP PERFORMED
Amphetamines: NOT DETECTED
Barbiturates: NOT DETECTED
Benzodiazepines: NOT DETECTED
Cocaine: NOT DETECTED
Opiates: NOT DETECTED
Tetrahydrocannabinol: NOT DETECTED

## 2021-12-29 LAB — URINALYSIS, ROUTINE W REFLEX MICROSCOPIC
Bilirubin Urine: NEGATIVE
Glucose, UA: NEGATIVE mg/dL
Ketones, ur: NEGATIVE mg/dL
Nitrite: NEGATIVE
Protein, ur: NEGATIVE mg/dL
Specific Gravity, Urine: 1.004 — ABNORMAL LOW (ref 1.005–1.030)
pH: 7 (ref 5.0–8.0)

## 2021-12-29 LAB — TSH: TSH: 1.882 u[IU]/mL (ref 0.350–4.500)

## 2021-12-29 LAB — CBG MONITORING, ED: Glucose-Capillary: 95 mg/dL (ref 70–99)

## 2021-12-29 LAB — ETHANOL: Alcohol, Ethyl (B): <10 mg/dL (ref <10)

## 2021-12-29 MED ORDER — FENTANYL CITRATE PF 50 MCG/ML IJ SOSY: 50.0000 ug | PREFILLED_SYRINGE | Freq: Once | INTRAMUSCULAR | Status: DC

## 2021-12-29 MED ORDER — LORAZEPAM 2 MG/ML IJ SOLN
1.0000 mg | INTRAMUSCULAR | Status: AC | PRN
  Administered 2021-12-30: 1 mg via INTRAVENOUS
  Filled 2021-12-29: qty 1

## 2021-12-29 MED ORDER — HALOPERIDOL LACTATE 5 MG/ML IJ SOLN
2.5000 mg | Freq: Once | INTRAMUSCULAR | Status: AC
  Administered 2021-12-29: 2.5 mg via INTRAMUSCULAR
  Filled 2021-12-29: qty 1

## 2021-12-29 MED ORDER — HYDROCODONE-ACETAMINOPHEN 5-325 MG PO TABS
1.0000 | ORAL_TABLET | Freq: Once | ORAL | Status: AC
  Administered 2021-12-29: 1 via ORAL
  Filled 2021-12-29: qty 1

## 2021-12-29 MED ORDER — HALOPERIDOL LACTATE 5 MG/ML IJ SOLN: 2.5000 mg | Freq: Once | INTRAMUSCULAR | Status: DC | PRN

## 2021-12-29 MED ORDER — LORAZEPAM 0.5 MG PO TABS
0.5000 mg | ORAL_TABLET | ORAL | Status: AC | PRN
  Administered 2021-12-29: 0.5 mg via ORAL
  Filled 2021-12-29: qty 1

## 2021-12-29 MED ORDER — LORAZEPAM 2 MG/ML IJ SOLN
0.5000 mg | INTRAMUSCULAR | Status: AC | PRN
  Administered 2021-12-30: 0.5 mg via INTRAVENOUS
  Filled 2021-12-29: qty 1

## 2021-12-29 MED ORDER — HALOPERIDOL LACTATE 5 MG/ML IJ SOLN
2.5000 mg | Freq: Once | INTRAMUSCULAR | Status: AC
  Administered 2021-12-29: 2.5 mg via INTRAVENOUS
  Filled 2021-12-29: qty 1

## 2021-12-29 MED ORDER — LORAZEPAM 2 MG/ML IJ SOLN
1.0000 mg | Freq: Once | INTRAMUSCULAR | Status: AC
  Administered 2021-12-29: 1 mg via INTRAVENOUS
  Filled 2021-12-29: qty 1

## 2021-12-29 NOTE — ED Notes (Addendum)
(  09:20) Pt has been very distressed, yelling, crying, saying "this hospital is trying to kill me." Pt had urinated on the stretcher and was cleaned, dried and repositioned. Now resting quietly, states she is feeling better but still c/o neck and back pain.  (09:54) Pt continues to yell for help, but unable to answer coherently when asked what she needs besides saying that she "can't stay here" and is "very afraid"

## 2021-12-29 NOTE — ED Provider Notes (Signed)
Care of patient assumed from Dr. Armandina Gemma for odd behaviors at home.  Husband states that he can no longer care for her.  She has back pain and there is concern of urinary fecal incontinence.  She is undergoing MRI of her spine.  Results are negative, she will require TTS TOC consults. Physical Exam  BP (!) 152/77 (BP Location: Right Arm)   Pulse 91   Temp 98 F (36.7 C) (Oral)   Resp 16   Ht '5\' 3"'$  (1.6 m)   Wt 87.5 kg   SpO2 98%   BMI 34.19 kg/m   Physical Exam Vitals and nursing note reviewed.  Constitutional:      General: She is not in acute distress.    Appearance: She is well-developed. She is not ill-appearing, toxic-appearing or diaphoretic.  HENT:     Head: Normocephalic and atraumatic.     Right Ear: External ear normal.     Left Ear: External ear normal.  Eyes:     Extraocular Movements: Extraocular movements intact.     Conjunctiva/sclera: Conjunctivae normal.  Cardiovascular:     Rate and Rhythm: Normal rate and regular rhythm.  Pulmonary:     Effort: Pulmonary effort is normal. No respiratory distress.  Abdominal:     General: There is no distension.  Musculoskeletal:        General: No swelling. Normal range of motion.     Cervical back: Neck supple.  Skin:    General: Skin is warm and dry.     Capillary Refill: Capillary refill takes less than 2 seconds.  Neurological:     General: No focal deficit present.     Mental Status: She is alert. She is disoriented.  Psychiatric:        Mood and Affect: Mood is anxious. Affect is labile.     Procedures  Procedures  ED Course / MDM    Medical Decision Making Amount and/or Complexity of Data Reviewed Labs: ordered. Radiology: ordered.  Risk Prescription drug management.   Haldol and Ativan were ordered in order for patient to undergo MRI imaging.  MRI imaging did not reveal any acute findings, although studies were limited by motion artifact.  Home medications were ordered.  TTS and TOC consult orders  were placed.  Patient to remain in the ED as a boarder pending TTS and TOC consults.       Godfrey Pick, MD 12/30/21 [phone]
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
## 2021-12-29 NOTE — ED Notes (Signed)
Pt keeps pulling cords and pure wick off and getting out of recliner. Doctor notified

## 2021-12-30 DIAGNOSIS — R4182 Altered mental status, unspecified: Secondary | ICD-10-CM | POA: Diagnosis present

## 2021-12-30 LAB — T4, FREE: Free T4: 1.26 ng/dL — ABNORMAL HIGH (ref 0.61–1.12)

## 2021-12-30 MED ORDER — HYDROXYZINE HCL 25 MG PO TABS
25.0000 mg | ORAL_TABLET | Freq: Four times a day (QID) | ORAL | Status: DC | PRN
  Administered 2021-12-30 – 2022-01-09 (×12): 25 mg via ORAL
  Filled 2021-12-30 (×12): qty 1

## 2021-12-30 MED ORDER — FLUOXETINE HCL 20 MG PO CAPS
40.0000 mg | ORAL_CAPSULE | Freq: Every day | ORAL | Status: DC
  Administered 2021-12-30 – 2022-01-11 (×13): 40 mg via ORAL
  Filled 2021-12-30 (×13): qty 2

## 2021-12-30 MED ORDER — SENNOSIDES-DOCUSATE SODIUM 8.6-50 MG PO TABS
1.0000 | ORAL_TABLET | Freq: Every day | ORAL | Status: DC
  Administered 2021-12-30 – 2022-01-27 (×28): 1 via ORAL
  Filled 2021-12-30 (×28): qty 1

## 2021-12-30 MED ORDER — METOPROLOL TARTRATE 25 MG PO TABS
12.5000 mg | ORAL_TABLET | Freq: Every day | ORAL | Status: DC
  Administered 2021-12-30 – 2022-01-14 (×15): 12.5 mg via ORAL
  Filled 2021-12-30 (×17): qty 1

## 2021-12-30 MED ORDER — QUETIAPINE FUMARATE 100 MG PO TABS
100.0000 mg | ORAL_TABLET | Freq: Every day | ORAL | Status: DC
  Administered 2021-12-30 – 2022-01-27 (×28): 100 mg via ORAL
  Filled 2021-12-30 (×30): qty 1

## 2021-12-30 MED ORDER — LEVOTHYROXINE SODIUM 25 MCG PO TABS
137.0000 ug | ORAL_TABLET | Freq: Every day | ORAL | Status: DC
  Administered 2021-12-30 – 2022-01-28 (×27): 137 ug via ORAL
  Filled 2021-12-30 (×30): qty 1

## 2021-12-30 MED ORDER — CARIPRAZINE HCL 1.5 MG PO CAPS
1.5000 mg | ORAL_CAPSULE | Freq: Every day | ORAL | Status: DC
  Administered 2021-12-30 – 2021-12-31 (×2): 1.5 mg via ORAL
  Filled 2021-12-30 (×4): qty 1

## 2021-12-30 MED ORDER — ZIPRASIDONE MESYLATE 20 MG IM SOLR
10.0000 mg | Freq: Once | INTRAMUSCULAR | Status: AC
  Administered 2021-12-30: 10 mg via INTRAMUSCULAR
  Filled 2021-12-30: qty 20

## 2021-12-30 MED ORDER — TRAZODONE HCL 50 MG PO TABS
100.0000 mg | ORAL_TABLET | Freq: Every evening | ORAL | Status: DC | PRN
  Administered 2021-12-30 – 2022-01-27 (×21): 100 mg via ORAL
  Filled 2021-12-30: qty 2
  Filled 2021-12-30 (×4): qty 1
  Filled 2021-12-30 (×3): qty 2
  Filled 2021-12-30: qty 1
  Filled 2021-12-30 (×2): qty 2
  Filled 2021-12-30: qty 1
  Filled 2021-12-30 (×2): qty 2
  Filled 2021-12-30: qty 1
  Filled 2021-12-30 (×4): qty 2
  Filled 2021-12-30: qty 1
  Filled 2021-12-30: qty 2
  Filled 2021-12-30 (×2): qty 1

## 2021-12-30 MED ORDER — STERILE WATER FOR INJECTION IJ SOLN
INTRAMUSCULAR | Status: AC
  Filled 2021-12-30: qty 10

## 2021-12-30 NOTE — ED Notes (Signed)
Pt agitated, calling out, trying to get up.

## 2021-12-30 NOTE — ED Provider Notes (Signed)
Emergency Medicine Observation Re-evaluation Note  Danielle [name] is a 76 y.o. female, seen on rounds today.  Pt initially presented to the ED for complaints of Psychiatric Evaluation Currently, the patient is resting.  Physical Exam  BP (!) 149/88 (BP Location: Left Arm)   Pulse 82   Temp (!) 97.5 F (36.4 C) (Oral)   Resp 18   Ht '5\' 3"'$  (1.6 m)   Wt 87.5 kg   SpO2 96%   BMI 34.19 kg/m  Physical Exam General: NAD Cardiac: well perfused Lungs: even and unlabored Psych: no agitation  ED Course / MDM  EKG:EKG Interpretation  Date/Time:  Monday December 29 2021 11:06:21 EST Ventricular Rate:  90 PR Interval:  176 QRS Duration: 142 QT Interval:  410 QTC Calculation: 502 R Axis:   41 Text Interpretation: Sinus rhythm Consider right atrial enlargement Right bundle branch block No significant change since last tracing Confirmed by Regan Lemming ([id number]) on 12/29/2021 11:29:14 AM  I have reviewed the labs performed to date as well as medications administered while in observation.  Recent changes in the last 24 hours include PT recommended short terms rehab. TTS consult pending.  Plan  Current plan is for TTS consult, social work coordinating dispo.    Regan Lemming, MD 12/30/21 1134

## 2021-12-30 NOTE — ED Notes (Signed)
Pt seems to relax if you tell her she is ok and rub her leg, arm or head. Pt needs the reassurance that someone is near her.

## 2021-12-30 NOTE — Consult Note (Addendum)
[facility] ED ASSESSMENT   Reason for Consult:  Psychiatry evaluation Referring Physician:  ER Physician Patient Identification: Danielle [name] MRN:  [id number] ED Chief Complaint: Pseudodementia  Diagnosis:  Principal Problem:   Pseudodementia Active Problems:   Altered mental status, unspecified   ED Assessment Time Calculation: Start Time: 1850 Stop Time: 1910 Total Time in Minutes (Assessment Completion): 20   Subjective:   Danielle [name] is a 76 y.o. female patient admitted with previous hx of Depression, anxiety, Cognitive decline was brought to the ER  via EMS for abnormal behavior.  Patient has been treated for UTI recently, has compression fracture of lower back that needs to be taken care of.  She was seen by her Psychiatrist on December  14 th.  HPI:  Due altered Mental status patient is not able to engage in meaningful assessment.  All day she has been receiving medications for agitation.  Patient attempts to get out the stretcher.  She only answers her name with eye closed and in some cases does not answer at all.  Patient cannot explain where and how she needs help.  Information in this report is from her son Danielle [name] who has visited twice today.  Danielle [name] reports that his mother is progressively mentally declining and is having constant panic attacks and anxiety.   He reports that his mother stays periods of days without sleep and then falls asleep and wakes up with energy.  When this happens she  fights sleep again and would not stop pacing in the house.  He reports that patient stays days without eating and then suddenly she eats.  She acknowledges that his mother Memory and cognition is declining and denies family hx of Dementia.  Danielle [name] states his mother fell few months ago and sustained small fracture of the lower lumber area that is not fixed yet.  He reports that his mother hallucinates seeing things and hearing things.  There are reports of patient trying to set things on  fire, throwing things around the house.  Son reports that his mother is no longer capable of taking care of her ADLS and will be interested in long term SNF or Memory care unit once she is stable.   Patient is taking Prozac and Vraylar for home medications.  We will make Medication changes as needed.  Olanzapine could be more suitable for calming patient down but for now lets give the Vraylar chance.  Past Psychiatric History: previous hx of Depression, anxiety, Cognitive decline.  Patient has outpatient Psychiatrist.  No information regarding inpatient Psychiatry hospitalization.  Risk to Self or Others: Is the patient at risk to self? No Has the patient been a risk to self in the past 6 months? No Has the patient been a risk to self within the distant past? No Is the patient a risk to others? No Has the patient been a risk to others in the past 6 months? No Has the patient been a risk to others within the distant past? No  [place] Scale:  [place] ED from 12/29/2021 in [place] DEPT ED from 12/09/2021 in [place] DEPT Admission (Discharged) from 11/26/2021 in [place] No Risk No Risk No Risk       AIMS:  , , ,  ,   ASAM:    Substance Abuse:     Past Medical History:  Past Medical History:  Diagnosis Date   Arthritis  02/13/2011   osteoarthritis, hips, knees,s/p Cervical fusion(DDD)   Atherosclerosis of aorta ([place]) 05/30/2015   on CTA chest   CAD (coronary artery disease)    a. 09/2016: Coronary CT showing mid-LAD plaque with 20-50% associated stenosis but no significant stenosis by FFR analysis.    Dementia ([place])    Depression with anxiety    Diabetes mellitus without complication ([place])    Difficult intubation 02/13/2011   with surgery -multiple times, no problems with recent surgeries   GERD (gastroesophageal reflux disease) 02/13/2011   tx. Omeprazole    Glaucoma 02/13/2011   bil. tx. eye drops daily   Headache(784.0) 02/13/2011   past hx. migraines, none recent   Heart murmur 02/13/2011   was told-no issues with this   Hypercholesterolemia    Hypertension 02/13/2011   Hypothyroidism 02/13/2011   tx. with Levothyroxine   Major depressive disorder    Mitral regurgitation 06/01/2015   Mild   Sinus drainage 02/13/2011   uses Zyrtec as needed   Sleep apnea 02/13/2011   Hx. sleep apnea-uses cpap since 10'12 nightly    Past Surgical History:  Procedure Laterality Date   ABDOMINAL HYSTERECTOMY  02/13/2011   APPENDECTOMY  02/13/2011   Lap. removal '92   BREAST CYST EXCISION Left    CERVICAL FUSION  02/13/2011   '92- retained hardware   CHOLECYSTECTOMY  02/13/2011   '98-lap. galbladder removal due to stones   GUM SURGERY  02/13/2011   "small mouth"   JOINT REPLACEMENT  02/13/2011   RTHA-a yr ago in [place] Left 08/15/2019   Procedure: EXCISION OF SEBACEOUS CYST LEFT BREAST;  Surgeon: Georganna Skeans, MD;  Location: [facility];  Service: General;  Laterality: Left;   TONSILLECTOMY  02/13/2011   child   TOTAL HIP ARTHROPLASTY  02/17/2011   Procedure: TOTAL HIP ARTHROPLASTY ANTERIOR APPROACH;  Surgeon: Mauri Pole, MD;  Location: [facility];  Service: Orthopedics;  Laterality: Left;   TUBAL LIGATION  02/13/2011   Family History:  Family History  Problem Relation Age of Onset   Cancer Mother        lung   Breast cancer Maternal Aunt    Alzheimer's disease Neg Hx    Dementia Neg Hx    Family Psychiatric  History: Son Denies Social History:  Social History   Substance and Sexual Activity  Alcohol Use Yes   Comment: rare     Social History   Substance and Sexual Activity  Drug Use No    Social History   Socioeconomic History   Marital status: Married    Spouse name: Not on file   Number of children: Not on file   Years of education: Not on file   Highest education level: Not on file   Occupational History   Occupation: Retired Not working  Tobacco Use   Smoking status: Never   Smokeless tobacco: Never  Vaping Use   Vaping Use: Never used  Substance and Sexual Activity   Alcohol use: Yes    Comment: rare   Drug use: No   Sexual activity: Yes    Birth control/protection: Surgical    Comment: hyst  Other Topics Concern   Not on file  Social History Narrative   Caffeine le-ss then once cup daily. Education: HS diploma.  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     Social Determinants of Health   Financial Resource Strain: Not on file  Food Insecurity: No Food  Insecurity (11/26/2021)   Hunger Vital Sign    Worried About Running Out of Food in the Last Year: Never true    Ran Out of Food in the Last Year: Never true  Transportation Needs: No Transportation Needs (11/26/2021)   PRAPARE - Hydrologist (Medical): No    Lack of Transportation (Non-Medical): No  Physical Activity: Not on file  Stress: Not on file  Social Connections: Not on file   Additional Social History:    Allergies:   Allergies  Allergen Reactions   Ceftin [Cefuroxime Axetil] Hives, Swelling and Other (See Comments)    Swelling of face   Codeine Nausea Only   Compazine [Prochlorperazine] Swelling and Other (See Comments)    Face and tongue swelling    Labs:  Results for orders placed or performed during the hospital encounter of 12/29/21 (from the past 48 hour(s))  Urine rapid drug screen (hosp performed)     Status: None   Collection Time: 12/29/21  9:34 AM  Result Value Ref Range   Opiates NONE DETECTED NONE DETECTED   Cocaine NONE DETECTED NONE DETECTED   Benzodiazepines NONE DETECTED NONE DETECTED   Amphetamines NONE DETECTED NONE DETECTED   Tetrahydrocannabinol NONE DETECTED NONE DETECTED   Barbiturates NONE DETECTED NONE DETECTED    Comment: (NOTE) DRUG SCREEN FOR MEDICAL PURPOSES ONLY.  IF CONFIRMATION IS NEEDED FOR ANY  PURPOSE, NOTIFY LAB WITHIN 5 DAYS.  LOWEST DETECTABLE LIMITS FOR URINE DRUG SCREEN Drug Class                     Cutoff (ng/mL) Amphetamine and metabolites    1000 Barbiturate and metabolites    200 Benzodiazepine                 200 Opiates and metabolites        300 Cocaine and metabolites        300 THC                            50 Performed at [facility], [place] [address], [place], [place] [address]   Urinalysis, Routine w reflex microscopic     Status: Abnormal   Collection Time: 12/29/21 10:37 AM  Result Value Ref Range   Color, Urine STRAW (A) YELLOW   APPearance CLEAR CLEAR   Specific Gravity, Urine 1.004 (L) 1.005 - 1.030   pH 7.0 5.0 - 8.0   Glucose, UA NEGATIVE NEGATIVE mg/dL   Hgb urine dipstick SMALL (A) NEGATIVE   Bilirubin Urine NEGATIVE NEGATIVE   Ketones, ur NEGATIVE NEGATIVE mg/dL   Protein, ur NEGATIVE NEGATIVE mg/dL   Nitrite NEGATIVE NEGATIVE   Leukocytes,Ua TRACE (A) NEGATIVE   RBC / HPF 0-5 0 - 5 RBC/hpf   WBC, UA 0-5 0 - 5 WBC/hpf   Bacteria, UA RARE (A) NONE SEEN    Comment: Performed at [facility], [place] [address] [place], [place] [address]  CBG monitoring, ED     Status: None   Collection Time: 12/29/21 10:45 AM  Result Value Ref Range   Glucose-Capillary 95 70 - 99 mg/dL    Comment: Glucose reference range applies only to samples taken after fasting for at least 8 hours.  Comprehensive metabolic panel     Status: None   Collection Time: 12/29/21  3:00 PM  Result Value Ref Range   Sodium 142 135 - 145 mmol/L  Potassium 3.5 3.5 - 5.1 mmol/L   Chloride 109 98 - 111 mmol/L   CO2 24 22 - 32 mmol/L   Glucose, Bld 91 70 - 99 mg/dL    Comment: Glucose reference range applies only to samples taken after fasting for at least 8 hours.   BUN 8 8 - 23 mg/dL   Creatinine, Ser 0.80 0.44 - 1.00 mg/dL   Calcium 9.7 8.9 - 10.3 mg/dL   Total Protein 6.7 6.5 - 8.1 g/dL   Albumin 3.8 3.5 - 5.0 g/dL   AST 18 15 - 41  U/L   ALT 15 0 - 44 U/L   Alkaline Phosphatase 71 38 - 126 U/L   Total Bilirubin 0.9 0.3 - 1.2 mg/dL   GFR, Estimated >60 >60 mL/min    Comment: (NOTE) Calculated using the CKD-EPI Creatinine Equation (2021)    Anion gap 9 5 - 15    Comment: Performed at [facility], [place] [address], [place], [place] [address]  CBC WITH DIFFERENTIAL     Status: None   Collection Time: 12/29/21  3:00 PM  Result Value Ref Range   WBC 7.5 4.0 - 10.5 K/uL   RBC 4.22 3.87 - 5.11 MIL/uL   Hemoglobin 12.7 12.0 - 15.0 g/dL   HCT 39.2 36.0 - 46.0 %   MCV 92.9 80.0 - 100.0 fL   MCH 30.1 26.0 - 34.0 pg   MCHC 32.4 30.0 - 36.0 g/dL   RDW 13.6 11.5 - 15.5 %   Platelets 242 150 - 400 K/uL   nRBC 0.0 0.0 - 0.2 %   Neutrophils Relative % 63 %   Neutro Abs 4.7 1.7 - 7.7 K/uL   Lymphocytes Relative 21 %   Lymphs Abs 1.6 0.7 - 4.0 K/uL   Monocytes Relative 9 %   Monocytes Absolute 0.7 0.1 - 1.0 K/uL   Eosinophils Relative 7 %   Eosinophils Absolute 0.5 0.0 - 0.5 K/uL   Basophils Relative 0 %   Basophils Absolute 0.0 0.0 - 0.1 K/uL   Immature Granulocytes 0 %   Abs Immature Granulocytes 0.02 0.00 - 0.07 K/uL    Comment: Performed at [facility], [place] [address], [place], [place] [address]  Ethanol     Status: None   Collection Time: 12/29/21  3:00 PM  Result Value Ref Range   Alcohol, Ethyl (B) <10 <10 mg/dL    Comment: (NOTE) Lowest detectable limit for serum alcohol is 10 mg/dL.  For medical purposes only. Performed at [facility], [place] [address] [place], [place] [address]   TSH     Status: None   Collection Time: 12/29/21  3:00 PM  Result Value Ref Range   TSH 1.882 0.350 - 4.500 uIU/mL    Comment: Performed by a 3rd Generation assay with a functional sensitivity of <=0.01 uIU/mL. Performed at [facility], [place] [address], [place], [place] [address]   T4, free     Status: Abnormal   Collection Time: 12/29/21  3:00 PM   Result Value Ref Range   Free T4 1.26 (H) 0.61 - 1.12 ng/dL    Comment: (NOTE) Biotin ingestion may interfere with free T4 tests. If the results are inconsistent with the TSH level, previous test results, or the clinical presentation, then consider biotin interference. If needed, order repeat testing after stopping biotin. Performed at [facility], [place] [address] [place], [place] [address]     Current Facility-Administered Medications  Medication Dose Route Frequency Provider Last Rate Last Admin   cariprazine (VRAYLAR) capsule 1.5 mg  1.5 mg Oral Daily Godfrey Pick, MD   1.5 mg at 12/30/21 1455   FLUoxetine (PROZAC) capsule 40 mg  40 mg Oral Daily Godfrey Pick, MD   40 mg at 12/30/21 1434   hydrOXYzine (ATARAX) tablet 25 mg  25 mg Oral Q6H PRN Godfrey Pick, MD   25 mg at 12/30/21 1434   levothyroxine (SYNTHROID) tablet 137 mcg  137 mcg Oral Q0600 Godfrey Pick, MD   137 mcg at 12/30/21 1434   LORazepam (ATIVAN) injection 0.5 mg  0.5 mg Intravenous PRN Regan Lemming, MD       metoprolol tartrate (LOPRESSOR) tablet 12.5 mg  12.5 mg Oral Q2000 Godfrey Pick, MD   12.5 mg at 12/30/21 0043   QUEtiapine (SEROQUEL) tablet 100 mg  100 mg Oral QHS Godfrey Pick, MD   100 mg at 12/30/21 0043   senna-docusate (Senokot-S) tablet 1 tablet  1 tablet Oral QHS Godfrey Pick, MD   1 tablet at 12/30/21 0043   sterile water (preservative free) injection            traZODone (DESYREL) tablet 100 mg  100 mg Oral QHS PRN Godfrey Pick, MD   100 mg at 12/30/21 0226   Current Outpatient Medications  Medication Sig Dispense Refill   CALCIUM-VITAMIN D PO Take 1 capsule by mouth daily. 1200-1000 calcium-vitamin d     cariprazine (VRAYLAR) 1.5 MG capsule Take 1 capsule (1.5 mg total) by mouth daily. 30 capsule 1   FLUoxetine (PROZAC) 40 MG capsule Take 1 capsule (40 mg total) by mouth daily. 30 capsule 1   fluticasone (FLONASE) 50 MCG/ACT nasal spray Place 2 sprays into both nostrils daily.     hydrOXYzine  (ATARAX) 25 MG tablet Take 1 tablet (25 mg total) by mouth every 6 (six) hours as needed for anxiety. 30 tablet 2   levothyroxine (SYNTHROID, LEVOTHROID) 137 MCG tablet Take 137 mcg by mouth daily before breakfast.     Magnesium 100 MG CAPS Take 100 mg by mouth daily.     metoprolol tartrate (LOPRESSOR) 25 MG tablet Take 0.5 tablets (12.5 mg total) by mouth daily at 8 pm. 30 tablet 3   nitroGLYCERIN (NITROSTAT) 0.4 MG SL tablet Place 0.4 mg under the tongue every 5 (five) minutes as needed for chest pain.     ondansetron (ZOFRAN) 4 MG tablet Take 1 tablet (4 mg total) by mouth every 8 (eight) hours as needed for nausea or vomiting. 30 tablet 0   QUEtiapine (SEROQUEL) 100 MG tablet Take 1 tablet (100 mg total) by mouth at bedtime. 30 tablet 3   rosuvastatin (CRESTOR) 40 MG tablet Take 1 tablet (40 mg total) by mouth daily. 90 tablet 3   senna-docusate (SENOKOT-S) 8.6-50 MG tablet Take 1 tablet by mouth at bedtime.     traZODone (DESYREL) 100 MG tablet Take 1 tablet (100 mg total) by mouth at bedtime as needed for sleep. 30 tablet 3   Vitamin D, Ergocalciferol, (DRISDOL) 1.25 MG (50000 UNIT) CAPS capsule Take 50,000 Units by mouth every Wednesday.      Musculoskeletal: Strength & Muscle Tone:  lying down on the stretcher Gait & Station:  lying down on s stretcher  Patient leans:  see above   Psychiatric Specialty Exam: Presentation  General Appearance:  Casual  Eye Contact: None  Speech: Other (comment) (minimal yes and no or none at all)  Speech Volume: Other (comment) (  monotone yes/no and some times not responding)  Handedness: Right   Mood and Affect  Mood: Anxious; Irritable; Labile (restless)  Affect: Congruent; Labile   Thought Process  Thought Processes: -- (unable to assess, patient cannot participate due to AMS)  Descriptions of Associations:-- (unable to assess)  Orientation:-- (unable to assess)  Thought Content:Other (comment) (Unable to  assess)  History of Schizophrenia/Schizoaffective disorder:No  Duration of Psychotic Symptoms:N/A  Hallucinations:Hallucinations: -- (unable to assess)  Ideas of Reference:None  Suicidal Thoughts:Suicidal Thoughts: -- (unable to assess)  Homicidal Thoughts:Homicidal Thoughts: -- (unable to assess)   Sensorium  Memory: -- (unable to assess)  Judgment: Other (comment) (unable to assess)  Insight: -- (unable to assess)   Executive Functions  Concentration: -- Pincus Badder)  Attention Span: Good  Recall: -- Pincus Badder)  Fund of Knowledge: -- Pincus Badder)  Language: -- (unable to engage in conversation)   Psychomotor Activity  Psychomotor Activity:Psychomotor Activity: Increased; Restlessness   Assets  Assets: Intimacy    Sleep  Sleep: Sleep: Poor (person son)   Physical Exam: Physical Exam ROS-Unable to assess at this time due to AMS-Patient is restless. Blood pressure (!) 159/81, pulse 82, temperature 98.6 F (37 C), temperature source Axillary, resp. rate 19, height '5\' 3"'$  (1.6 m), weight 87.5 kg, SpO2 98 %. Body mass index is 34.19 kg/m.  Medical Decision Making: Patient  with AMS at this time, she is not able to engage in any meaningful conversation.  Information from husband and son states memory and cognition is declining, patient's mobility is poor and she is not capable of taking care of her ADLS. Once patient is stable goal is to seek placement at a SNF or Memory care unit. She is on Prozac and Vraylar for depression and anxiety and Vraylar for adjunct treatment for depression.  Once patient is cleared TOC will be needed to assist with appropriate disposition. Problem 1: Altered Mental Status  Problem 2: Anxiety disorder  Disposition:  Reevaluate in am.  Patient did not participate in any part of this documentation .  Delfin Gant, NP-PMHNP-BC 12/30/2021 7:35 PM

## 2021-12-30 NOTE — Progress Notes (Signed)
This CSW noted TTS consult. TOC will reengage if/when pt is cleared from TTS care.

## 2021-12-30 NOTE — ED Notes (Signed)
Upon trying to keep the pt from getting out of the bed, she tried to bite my arm. Pt also kicked at me several times while trying to keep her legs in the bed.

## 2021-12-30 NOTE — Evaluation (Signed)
Physical Therapy Evaluation Patient Details Name: Danielle [name] MRN: [id number] DOB: 18-Aug-1945 Today's Date: 12/30/2021  History of Present Illness  76 year old female with medical history significant for diagnosis of pseudodementia, depression, anxiety who presents to the emergency department 12/29/21 with concern for abnormal behaviors, recently in hospital for same.  Clinical Impression  Pt admitted with above diagnosis.  Pt currently with functional limitations due to the deficits listed below (see PT Problem List). Pt will benefit from skilled PT to increase their independence and safety with mobility to allow discharge to the venue listed below.     Patient is  lethargic, arouses at times, indicates pain when LLE is   flexed. Patient did not follow any directions, keeps eyes closed.  Unable to assess  patient's ability  to mobilize at this time.    Recommendations for follow up therapy are one component of a multi-disciplinary discharge planning process, led by the attending physician.  Recommendations may be updated based on patient status, additional functional criteria and insurance authorization.  Follow Up Recommendations Skilled nursing-short term rehab (<3 hours/day) (psych Eval pend.) Can patient physically be transported by private vehicle: No    Assistance Recommended at Discharge Frequent or constant Supervision/Assistance  Patient can return home with the following  Two people to help with walking and/or transfers;Two people to help with bathing/dressing/bathroom;Assistance with cooking/housework    Equipment Recommendations None recommended by PT  Recommendations for Other Services       Functional Status Assessment Patient has had a recent decline in their functional status and demonstrates the ability to make significant improvements in function in a reasonable and predictable amount of time.     Precautions / Restrictions Precautions Precautions:  Fall Restrictions Weight Bearing Restrictions: No      Mobility  Bed Mobility               General bed mobility comments: total assist to attempt sitting forward/upright with stretcher in chair positioin. returned to more supine position.    Transfers                   General transfer comment: unable to test    Ambulation/Gait                  Stairs            Wheelchair Mobility    Modified Rankin (Stroke Patients Only)       Balance Overall balance assessment: Needs assistance Sitting-balance support: Feet unsupported Sitting balance-Leahy Scale: Zero                                       Pertinent Vitals/Pain Pain Assessment Pain Assessment: Faces Breathing: occasional labored breathing, short period of hyperventilation Negative Vocalization: occasional moan/groan, low speech, negative/disapproving quality Facial Expression: facial grimacing Body Language: tense, distressed pacing, fidgeting Consolability: distracted or reassured by voice/touch PAINAD Score: 6 Pain Location: indicates Left hip when flexed, Pain Intervention(s): Limited activity within patient's tolerance    Home Living Family/patient expects to be discharged to:: Private residence Living Arrangements: Spouse/significant other Available Help at Discharge: Family Type of Home: House Home Access: Stairs to enter   Technical brewer of Steps: 2 Alternate Level Stairs-Number of Steps: flight- has chair lift Home Layout: Two level;1/2 bath on main level Home Equipment: Conservation officer, nature (2 wheels);Shower seat;Shower seat - built in;Cane - single point;Adaptive equipment Additional Comments:  information is from past hospital visits. patient unable to provide this information today    Prior Function Prior Level of Function : Needs assist             Mobility Comments: per Ed notes, spouse has been providing assistance  for patient, ? what  level       Hand Dominance   Dominant Hand: Left    Extremity/Trunk Assessment   Upper Extremity Assessment Upper Extremity Assessment: Generalized weakness (able to lift both above head)    Lower Extremity Assessment Lower Extremity Assessment: RLE deficits/detail;LLE deficits/detail RLE Deficits / Details: noted edema and reddened lower leg,  tolerated passive hip and knee flexion in supine, patient  did not provide any effort, mostly lethargic, keeps eyes closed. LLE Deficits / Details: Indicates hip pain with PROM, reaches down to thigh and moans, did say," oh my leg."    Cervical / Trunk Assessment Cervical / Trunk Assessment: Other exceptions Cervical / Trunk Exceptions: placed in upright chair position. Assisted to pull patient to sitting upright, patient moaning as  trunk brought forward, patient does not assist and does not balance in sitting upright.  Communication   Communication:  (speech is garbled, and  very little  though  due to lethargy)  Cognition Arousal/Alertness: Lethargic Behavior During Therapy: Restless Overall Cognitive Status: Impaired/Different from baseline Area of Impairment: Orientation, Attention, Following commands                 Orientation Level: Place Current Attention Level: Focused           General Comments: did not follow any direc5tions when more aroused        General Comments      Exercises     Assessment/Plan    PT Assessment Patient needs continued PT services  PT Problem List Decreased strength;Decreased range of motion;Decreased cognition;Decreased activity tolerance;Decreased safety awareness;Decreased balance;Decreased mobility;Decreased knowledge of precautions;Pain       PT Treatment Interventions DME instruction;Therapeutic exercise;Gait training;Balance training;Functional mobility training;Therapeutic activities;Patient/family education;Cognitive remediation    PT Goals (Current goals can be found in  the Care Plan section)  Acute Rehab PT Goals PT Goal Formulation: Patient unable to participate in goal setting Time For Goal Achievement: 01/13/22 Potential to Achieve Goals: Fair    Frequency Min 2X/week     Co-evaluation               AM-PAC PT "6 Clicks" Mobility  Outcome Measure Help needed turning from your back to your side while in a flat bed without using bedrails?: Total Help needed moving from lying on your back to sitting on the side of a flat bed without using bedrails?: Total Help needed moving to and from a bed to a chair (including a wheelchair)?: Total Help needed standing up from a chair using your arms (e.g., wheelchair or bedside chair)?: Total Help needed to walk in hospital room?: Total Help needed climbing 3-5 steps with a railing? : Total 6 Click Score: 6    End of Session   Activity Tolerance: Patient limited by lethargy Patient left: in bed;with nursing/sitter in room Nurse Communication: Mobility status PT Visit Diagnosis: Unsteadiness on feet (R26.81)    Time: 3235-5732 PT Time Calculation (min) (ACUTE ONLY): 17 min   Charges:   PT Evaluation $PT Eval Low Complexity: [place] Office [phone] Weekend [phone]   Claretha Cooper 12/30/2021, 9:00 AM

## 2021-12-30 NOTE — ED Notes (Signed)
Son visiting, Reginold Agent FNP with psych speaking with him about plan of care.

## 2021-12-31 DIAGNOSIS — R4189 Other symptoms and signs involving cognitive functions and awareness: Secondary | ICD-10-CM

## 2021-12-31 DIAGNOSIS — R4182 Altered mental status, unspecified: Secondary | ICD-10-CM | POA: Diagnosis not present

## 2021-12-31 LAB — URINE CULTURE: Culture: <10000 — AB

## 2021-12-31 MED ORDER — STERILE WATER FOR INJECTION IJ SOLN
INTRAMUSCULAR | Status: AC
  Administered 2021-12-31: 1.2 mL
  Filled 2021-12-31: qty 10

## 2021-12-31 MED ORDER — LORAZEPAM 2 MG/ML IJ SOLN
1.0000 mg | Freq: Once | INTRAMUSCULAR | Status: AC | PRN
  Administered 2021-12-31: 1 mg via INTRAMUSCULAR
  Filled 2021-12-31: qty 1

## 2021-12-31 MED ORDER — ZIPRASIDONE MESYLATE 20 MG IM SOLR
10.0000 mg | Freq: Once | INTRAMUSCULAR | Status: AC
  Administered 2021-12-31: 10 mg via INTRAMUSCULAR
  Filled 2021-12-31: qty 20

## 2021-12-31 MED ORDER — QUETIAPINE FUMARATE 25 MG PO TABS
25.0000 mg | ORAL_TABLET | Freq: Two times a day (BID) | ORAL | Status: DC
  Administered 2022-01-01 – 2022-01-28 (×53): 25 mg via ORAL
  Filled 2021-12-31 (×55): qty 1

## 2021-12-31 NOTE — ED Notes (Signed)
Pt able to tolerate taking medications with apple sauce. Pt tolerating x2 cups of apple sauce, half a serving of grits and some water. Pt cont yelling. Incoherent sentences.

## 2021-12-31 NOTE — Consult Note (Signed)
Danielle [name] is a 76 year old female that was admitted due to cognitive decline and history of depression and anxiety.  Per provider assessment note patient was cleared with transition of care follow-up to assist with disposition such as skilled nursing facility and/or memory care.  NP spoke to patient's husband at 12:30 regarding increasing current medications.  She is currently prescribed Seroquel 100 mg nightly, trazodone 50 mg as needed and Prozac 40 mg daily.  Discussed adding Seroquel 25 mg p.o. twice daily for agitation and mood stabilization.  Education provided with blackbox warning related to Seroquel.  [name] stated " she does not officially have a dementia or Alzheimer's diagnosis."  Reports he is currently driving and he will follow-up with nursing at his follow-up visit.  Was receptive to initiating Seroquel 25 mg p.o. twice daily.  Orders placed for [facility] consult.  Patient is cleared by psychiatry.

## 2021-12-31 NOTE — ED Notes (Addendum)
Pt with large bowel mvmt. Linen changed, pericare completed. New purewick in place. Pt cont yelling incoherent phrases.

## 2021-12-31 NOTE — ED Notes (Signed)
Attempted to give HS medication and obtain vital signs  Danielle [name] becoming increasingly agitated and restless attempting to get oob. Appears very confused and disoriented with rambling  nonsensical conversation. Sitter at bedside pt repositioned in bed and side rails engaged safety mits in place.

## 2021-12-31 NOTE — ED Notes (Signed)
Pt agitated and yelling out, Have requested meds

## 2021-12-31 NOTE — ED Notes (Addendum)
Pt cont yelling, moving self in stretcher, putting legs and arms through side rails. Sitter at bedside for safety. MD at bedside.

## 2021-12-31 NOTE — NC FL2 (Signed)
[place] LEVEL OF CARE FORM     IDENTIFICATION  Patient Name: Danielle [name] Birthdate: 09-23-1945 Sex: female Admission Date (Current Location): 12/29/2021  [facility] and [place] Number:  Herbalist and Address:  [facility],  [address] [place], [place]      Provider Number: [id number]  Attending Physician Name and Address:  Gareth Morgan, MD  Relative Name and Phone Number:  Manilla Strieter (spouse) [phone]    Current Level of Care: SNF Recommended Level of Care: [place] Prior Approval Number:    Date Approved/Denied:   PASRR Number: [id number]  Discharge Plan: SNF    Current Diagnoses: Patient Active Problem List   Diagnosis Date Noted   Altered mental status, unspecified 12/30/2021   Major depressive disorder, single episode, severe with psychotic features ([place]) 11/26/2021   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   New onset of congestive heart failure ([place]) 11/23/2021   Delirium 11/23/2021   Type 2 diabetes mellitus ([place]) 11/23/2021   UTI (urinary tract infection) 10/28/2021   Hallucinations 10/28/2021   Urinary tract infection without hematuria    Hypotension 10/27/2021   AKI (acute kidney injury) ([place]) 10/27/2021   Closed compression fracture of L3 lumbar vertebra, initial encounter ([place]) 10/27/2021   Generalized anxiety disorder 10/16/2021   Cognitive decline 10/16/2021   Memory loss 12/22/2020   Alteration of awareness 12/22/2020   Palpitations 08/17/2019   Calcification of native coronary artery 10/20/2016   Morbid obesity ([place])    Chest pain 10/07/2016   Hyperglycemia 10/07/2016   Acute respiratory failure with hypoxia ([place]) 05/30/2015   Atypical pneumonia 05/30/2015   Dyspnea 05/30/2015   Hyperlipidemia LDL goal <70    Gastroesophageal reflux disease without esophagitis    Depression with anxiety    Tibial plateau fracture 05/16/2015   Closed tibial fracture 05/16/2015   Sebaceous cyst of  breast 07/27/2013   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   OSA (obstructive sleep apnea) 02/13/2011   Hypertension 02/13/2011   GERD (gastroesophageal reflux disease) 02/13/2011   Hypothyroidism 02/13/2011    Orientation RESPIRATION BLADDER Height & Weight     Self, Time  Normal Continent Weight: 87.5 kg Height:  '5\' 3"'$  (160 cm)  BEHAVIORAL SYMPTOMS/MOOD NEUROLOGICAL BOWEL NUTRITION STATUS      Continent Diet (regular diet with thin fluids)  AMBULATORY STATUS COMMUNICATION OF NEEDS Skin   Extensive Assist Verbally Normal                       Personal Care Assistance Level of Assistance  Bathing, Feeding, Dressing Bathing Assistance: Maximum assistance Feeding assistance: Maximum assistance Dressing Assistance: Maximum assistance     Functional Limitations Info  Sight, Hearing, Speech Sight Info: Adequate Hearing Info: Adequate Speech Info: Impaired (garbled)    SPECIAL CARE FACTORS FREQUENCY  PT (By licensed PT), OT (By licensed OT)     PT Frequency: 5x per week OT Frequency: 5x per week            Contractures Contractures Info: Not present    Additional Factors Info  Code Status, Allergies Code Status Info: Full Allergies Info: Ceftin (Cefuroxime Axetil), Codeine, Compazine (Prochlorperazine)           Current Medications (12/31/2021):  This is the current hospital active medication list Current Facility-Administered Medications  Medication Dose Route Frequency Provider Last Rate Last Admin   FLUoxetine (PROZAC) capsule 40 mg  40 mg Oral Daily Godfrey Pick, MD   40 mg at  12/31/21 1014   hydrOXYzine (ATARAX) tablet 25 mg  25 mg Oral Q6H PRN Godfrey Pick, MD   25 mg at 12/31/21 1015   levothyroxine (SYNTHROID) tablet 137 mcg  137 mcg Oral Q0600 Godfrey Pick, MD   137 mcg at 12/31/21 1014   metoprolol tartrate (LOPRESSOR) tablet 12.5 mg  12.5 mg Oral Q2000 Godfrey Pick, MD   12.5 mg at 12/30/21 0043   QUEtiapine (SEROQUEL) tablet 100 mg  100 mg Oral QHS  Godfrey Pick, MD   100 mg at 12/30/21 0043   QUEtiapine (SEROQUEL) tablet 25 mg  25 mg Oral BID Derrill Center, NP       senna-docusate (Senokot-S) tablet 1 tablet  1 tablet Oral QHS Godfrey Pick, MD   1 tablet at 12/30/21 0043   traZODone (DESYREL) tablet 100 mg  100 mg Oral QHS PRN Godfrey Pick, MD   100 mg at 12/30/21 0226   Current Outpatient Medications  Medication Sig Dispense Refill   bisacodyl (DULCOLAX) 5 MG EC tablet Take 5 mg by mouth daily as needed for mild constipation or moderate constipation.     CALCIUM PO Take 1 tablet by mouth every 7 (seven) days.     cariprazine (VRAYLAR) 1.5 MG capsule Take 1 capsule (1.5 mg total) by mouth daily. 30 capsule 1   FLUoxetine (PROZAC) 40 MG capsule Take 1 capsule (40 mg total) by mouth daily. 30 capsule 1   HYDROcodone-acetaminophen (NORCO/VICODIN) 5-325 MG tablet Take 1 tablet by mouth every 6 (six) hours as needed for moderate pain.     hydrOXYzine (ATARAX) 25 MG tablet Take 1 tablet (25 mg total) by mouth every 6 (six) hours as needed for anxiety. (Patient taking differently: Take 25 mg by mouth See admin instructions. Take 25 mg by mouth in the evening as needed for anxiety) 30 tablet 2   ibuprofen (ADVIL) 800 MG tablet Take 800 mg by mouth every 8 (eight) hours as needed (for pain).     levothyroxine (SYNTHROID, LEVOTHROID) 137 MCG tablet Take 137 mcg by mouth daily before breakfast.     Magnesium 100 MG CAPS Take 100 mg by mouth every 7 (seven) days.     metoprolol tartrate (LOPRESSOR) 25 MG tablet Take 0.5 tablets (12.5 mg total) by mouth daily at 8 pm. (Patient taking differently: Take 12.5 mg by mouth in the morning and at bedtime.) 30 tablet 3   nitroGLYCERIN (NITROSTAT) 0.4 MG SL tablet Place 0.4 mg under the tongue every 5 (five) minutes as needed for chest pain.     ondansetron (ZOFRAN) 4 MG tablet Take 1 tablet (4 mg total) by mouth every 8 (eight) hours as needed for nausea or vomiting. 30 tablet 0   QUEtiapine (SEROQUEL) 100 MG  tablet Take 1 tablet (100 mg total) by mouth at bedtime. 30 tablet 3   rosuvastatin (CRESTOR) 40 MG tablet Take 1 tablet (40 mg total) by mouth daily. 90 tablet 3   traZODone (DESYREL) 100 MG tablet Take 1 tablet (100 mg total) by mouth at bedtime as needed for sleep. (Patient taking differently: Take 100 mg by mouth at bedtime.) 30 tablet 3   Vitamin D, Ergocalciferol, (DRISDOL) 1.25 MG (50000 UNIT) CAPS capsule Take 50,000 Units by mouth every Wednesday.     senna-docusate (SENOKOT-S) 8.6-50 MG tablet Take 1 tablet by mouth at bedtime. (Patient not taking: Reported on 12/30/2021)       Discharge Medications: Please see discharge summary for a list of discharge medications.  Relevant Imaging  Results:  Relevant Lab Results:   Additional Information SSN: [id number]/ See psych medications on meds list  Roseanne Kaufman, RN

## 2021-12-31 NOTE — TOC Progression Note (Addendum)
Transition of Care [facility]) - Progression Note    Patient Details  Name: Danielle [name] MRN: [id number] Date of Birth: 11/23/1945  [facility]) CM/SW [place], RN Phone Number: 12/31/2021, 2:08 PM  Clinical Narrative:   This RNCM spoke with patient's husband Ed who reports he is no longer able to take care of his wife at home. This RNCM advised of PT recommendation for short term SNF placement. Patient's husband agrees and would like SNF work up. This RNCM explained the SNF placement process and will contact him once any bed offers are received.  This RNCM sent FL2 for bed offers.   TOC will continue to follow, awaiting bed offers and insurance auth.    Planned Disposition: Skilled Nursing Facility Barriers to Discharge: Continued Medical Work up  Expected Discharge Plan and Services In-house Referral: NA Discharge Planning Services: CM Consult Post Acute Care Choice: [place] Living arrangements for the past 2 months: Single Family Home                   DME Agency: NA       HH Arranged: NA [place] Agency: NA         Social Determinants of Health (SDOH) Interventions [place]: No Food Insecurity (11/26/2021)  Housing: Low Risk  (11/26/2021)  Transportation Needs: No Transportation Needs (11/26/2021)  Utilities: Not At Risk (11/26/2021)  Alcohol Screen: Low Risk  (11/26/2021)  Depression (PHQ2-9): High Risk (10/09/2021)  Tobacco Use: Low Risk  (12/29/2021)    Readmission Risk Interventions    10/31/2021    5:06 PM  Readmission Risk Prevention Plan  Transportation Screening Complete  PCP or Specialist Appt within 3-5 Days Complete  HRI or [place] Complete  Social Work Consult for [place] Planning/Counseling Complete  Palliative Care Screening Not Applicable  Medication Review Press photographer) Complete

## 2022-01-01 MED ORDER — LIP MEDEX EX OINT
TOPICAL_OINTMENT | Freq: Once | CUTANEOUS | Status: AC
  Administered 2022-01-01: 75 via TOPICAL
  Filled 2022-01-01: qty 7

## 2022-01-01 MED ORDER — STERILE WATER FOR INJECTION IJ SOLN
INTRAMUSCULAR | Status: AC
  Administered 2022-01-01: 1.2 mL
  Filled 2022-01-01: qty 10

## 2022-01-01 MED ORDER — LORAZEPAM 2 MG/ML IJ SOLN
1.0000 mg | Freq: Once | INTRAMUSCULAR | Status: AC
  Administered 2022-01-01: 1 mg via INTRAMUSCULAR
  Filled 2022-01-01: qty 1

## 2022-01-01 MED ORDER — ZIPRASIDONE MESYLATE 20 MG IM SOLR
10.0000 mg | INTRAMUSCULAR | Status: DC | PRN
  Administered 2022-01-01 – 2022-01-08 (×3): 10 mg via INTRAMUSCULAR
  Filled 2022-01-01 (×3): qty 20

## 2022-01-01 NOTE — Progress Notes (Signed)
Physical Therapy Treatment Patient Details Name: Danielle [name] MRN: [id number] DOB: [birth year] Today's Date: 01/01/2022   History of Present Illness 76 year old female with medical history significant for diagnosis of pseudodementia, depression, anxiety who presents to the emergency department 12/29/21 with concern for abnormal behaviors, recently in hospital for same.    PT Comments    Patient lethargic, keeps eyes closed, unable to participate in any meaningful mobility.   Recommendations for follow up therapy are one component of a multi-disciplinary discharge planning process, led by the attending physician.  Recommendations may be updated based on patient status, additional functional criteria and insurance authorization.  Follow Up Recommendations  Skilled nursing-short term rehab (<3 hours/day) Can patient physically be transported by private vehicle: No   Assistance Recommended at Discharge Frequent or constant Supervision/Assistance  Patient can return home with the following Two people to help with walking and/or transfers;Two people to help with bathing/dressing/bathroom;Assistance with cooking/housework   Equipment Recommendations  None recommended by PT    Recommendations for Other Services       Precautions / Restrictions Precautions Precautions: Fall     Mobility  Bed Mobility               General bed mobility comments: total assist to lean forward after bed placed inthe chair position,. No change in patient arousal with stimulation.    Transfers                   General transfer comment: unable to test    Ambulation/Gait                   Stairs             Wheelchair Mobility    Modified Rankin (Stroke Patients Only)       Balance Overall balance assessment: Needs assistance Sitting-balance support: Feet unsupported Sitting balance-Leahy Scale: Zero Sitting balance - Comments: attempted forward sitting  upright in bed, patient very lethargic and unable to balance                                    Cognition Arousal/Alertness: Lethargic   Overall Cognitive Status: No family/caregiver present to determine baseline cognitive functioning                                 General Comments: patient   slurred her last name, did not arouse to participate, eyes remained closed, did not follow any directions        Exercises General Exercises - Lower Extremity Ankle Circles/Pumps: PROM, Both, 10 reps Heel Slides: PROM, 10 reps, Both, Supine    General Comments        Pertinent Vitals/Pain Pain Assessment Pain Assessment: No/denies pain    Home Living                          Prior Function            PT Goals (current goals can now be found in the care plan section) Progress towards PT goals: Progressing toward goals    Frequency    Min 2X/week      PT Plan Current plan remains appropriate    Co-evaluation              AM-PAC PT "6 Clicks" Mobility  Outcome Measure  Help needed turning from your back to your side while in a flat bed without using bedrails?: Total Help needed moving from lying on your back to sitting on the side of a flat bed without using bedrails?: Total Help needed moving to and from a bed to a chair (including a wheelchair)?: Total Help needed standing up from a chair using your arms (e.g., wheelchair or bedside chair)?: Total Help needed to walk in hospital room?: Total Help needed climbing 3-5 steps with a railing? : Total 6 Click Score: 6    End of Session   Activity Tolerance: Patient limited by lethargy Patient left: in bed;with nursing/sitter in room Freight forwarder in hall) Nurse Communication: Mobility status PT Visit Diagnosis: Unsteadiness on feet (R26.81)     Time: 3202-3343 PT Time Calculation (min) (ACUTE ONLY): 12 min  Charges:  $Therapeutic Activity: 8-22 mins                      [place] Office [phone] Weekend pager-[phone]    Claretha Cooper 01/01/2022, 12:23 PM

## 2022-01-01 NOTE — Progress Notes (Signed)
Whitney from [facility] reached out to this CSW about the patient. Whitney reported that the husband had to look into finances for the patient to be placed. Whitney asked CSW to  send a secure email of a problems list for the patient.  CSW sent that list as whitney would have to see if the unconfirmed diagnosis  would be enough for the patient to be placed. TOC will continue to follow and give updates as they come.

## 2022-01-01 NOTE — ED Notes (Signed)
Patient has been alert  but slept most of the shift.  Patient confused.  Patient makes noises.  Patient needs help with ADLs.

## 2022-01-01 NOTE — ED Provider Notes (Signed)
Emergency Medicine Observation Re-evaluation Note  Danielle [name] is a 76 y.o. female, seen on rounds today.  Pt initially presented to the ED for complaints of Psychiatric Evaluation Currently, the patient is agitated, difficult to redirect.  Physical Exam  BP (!) 149/116 (BP Location: Right Leg)   Pulse (!) 120   Temp 99.2 F (37.3 C) (Oral)   Resp 18   Ht '5\' 3"'$  (1.6 m)   Wt 87.5 kg   SpO2 94%   BMI 34.19 kg/m  Physical Exam General: agitated Cardiac: RR Lungs: even,unlabored Psych: agitated  ED Course / MDM  EKG:EKG Interpretation  Date/Time:  Monday December 29 2021 11:06:21 EST Ventricular Rate:  90 PR Interval:  176 QRS Duration: 142 QT Interval:  410 QTC Calculation: 502 R Axis:   41 Text Interpretation: Sinus rhythm Consider right atrial enlargement Right bundle branch block No significant change since last tracing Confirmed by Regan Lemming ([id number]) on 12/29/2021 11:29:14 AM  I have reviewed the labs performed to date as well as medications administered while in observation.  Recent changes in the last 24 hours include none.  Reviewed recent notes, labs and imaging.   Plan  Current plan is for TTS reevaluation, consider SW placement if no indication for psychiatric placement.Gareth Morgan, MD 01/01/22 [phone]

## 2022-01-01 NOTE — ED Notes (Addendum)
Remains restless and animated vital signs attempted unsuccessfully due pt resistance to  care. Sitter remains at bedside.

## 2022-01-01 NOTE — Progress Notes (Addendum)
This CSW has followed up with the following SNFs:  [facility]: Having technical difficulties with computer. Will review when they are able to login. Requested H&P and this CSW informed there is not one due to the pt being in the ED.  [address]- Shawn, Engineer, technical sales, will be coming to do an in-person assessment on the pt to see if they can meet her needs.  [facility]: Having DON review.   This CSW also referred pt and family to Placido Sou with [facility].  Addend @ 10:36 AM This CSW contacted the pt's husband, Ed.This CSW provided updates regarding the pt being sent out to SNFs and informed that 3 facilities are reviewing her. Ed reiterated that he has been caring for her for the past 7 months and that he is unable to continue to care for her from home. This CSW informed that Placido Sou at [facility] will be reaching out soon to discuss options and also assist with finding long term placement.   Addend @ 3:16 PM This CSW asked Whitney with [facility] to inform TOC if [facility] can accept the pt. TOC following.

## 2022-01-01 NOTE — ED Provider Notes (Signed)
Emergency Medicine Observation Re-evaluation Note  Danielle [name] is a 76 y.o. female, seen on rounds today.  Pt initially presented to the ED for complaints of Psychiatric Evaluation Currently, the patient is resting.  Physical Exam  BP (!) 149/116 (BP Location: Right Leg)   Pulse (!) 120   Temp 99.2 F (37.3 C) (Oral)   Resp 18   Ht '5\' 3"'$  (1.6 m)   Wt 87.5 kg   SpO2 94%   BMI 34.19 kg/m  Physical Exam General: sleeping Cardiac: RR Lungs: even,unlabored Psych: resting  ED Course / MDM  EKG:EKG Interpretation  Date/Time:  Monday December 29 2021 11:06:21 EST Ventricular Rate:  90 PR Interval:  176 QRS Duration: 142 QT Interval:  410 QTC Calculation: 502 R Axis:   41 Text Interpretation: Sinus rhythm Consider right atrial enlargement Right bundle branch block No significant change since last tracing Confirmed by Regan Lemming ([id number]) on 12/29/2021 11:29:14 AM  I have reviewed the labs performed to date as well as medications administered while in observation.  Recent changes in the last 24 hours include none.  Reviewed recent notes, labs and imaging.   Plan  Current plan is for TTS reevaluation, consider SW placement if no indication for psychiatric placement.Audley Hose, MD 01/01/22 [phone]

## 2022-01-01 NOTE — ED Notes (Signed)
Screaming out has decreased but pt remains awake.

## 2022-01-01 NOTE — ED Notes (Signed)
Unable to get Danielle [name] to take her AM medication or to obtain AM vital signs she is very resistant to care  grabbing and pull on all equipt or linens in her reach and continues to utter nonsensical  words.

## 2022-01-02 NOTE — ED Notes (Signed)
Remains awake asking for her mother  no attempts at getting oob sitter remains at bedside

## 2022-01-02 NOTE — ED Notes (Addendum)
Pt was sleeping fine until noise loud from talking and laughing woke her up. Pt is now taking off her gown and trying to get out of the bed. Pt stated that she was going to hate my guts and that I needed to get out of her house. I informed the pt that she was in the hospital and that she could not get up. Pt is still currently sliding down the bed with her feet hanging off. Nurse is within hearing range of what is taking place.

## 2022-01-02 NOTE — ED Provider Notes (Signed)
Emergency Medicine Observation Re-evaluation Note  Danielle [name] is a 76 y.o. female, seen on rounds today.  Pt initially presented to the ED for complaints of Psychiatric Evaluation Currently, the patient is currently awaiting facility placement.  Patient last evaluated by behavioral health December 20.  Not a candidate for inpatient care.  Behavioral health did make medication adjustments on the 20th.  Patient currently awake and somewhat agitated.  Physical Exam  BP 115/68 (BP Location: Right Arm)   Pulse 90   Temp 98.5 F (36.9 C) (Axillary)   Resp 20   Ht 1.6 m ('5\' 3"'$ )   Wt 87.5 kg   SpO2 97%   BMI 34.19 kg/m  Physical Exam General: Awake and agitated Cardiac: Regular rate and rhythm Lungs: No respiratory distress Psych: Awake and agitated  ED Course / MDM  EKG:EKG Interpretation  Date/Time:  Monday December 29 2021 11:06:21 EST Ventricular Rate:  90 PR Interval:  176 QRS Duration: 142 QT Interval:  410 QTC Calculation: 502 R Axis:   41 Text Interpretation: Sinus rhythm Consider right atrial enlargement Right bundle branch block No significant change since last tracing Confirmed by Regan Lemming ([id number]) on 12/29/2021 11:29:14 AM  I have reviewed the labs performed to date as well as medications administered while in observation.  Recent changes in the last 24 hours include evaluation by nurse practitioner for behavioral health on December 20.  No indication for inpatient psychiatric care.  They made medication adjustments.  Plan  Current plan is for nursing home placement.    Fredia Sorrow, MD 01/02/22 [id number]

## 2022-01-02 NOTE — ED Notes (Signed)
Tried encouraging pt to eat something. Pt ate half a banana that she held herself and a little bit of her salad with a gingerale. Pt did not want anything else on her tray.

## 2022-01-02 NOTE — ED Notes (Signed)
Pt is confused and lethargic. Pt yelled out repetitive names or phrases throughout the shift. Pt occasionally opened her eyes. Pt required total care. I removed mittens at 9 AM. Pt spouse at bedside 10:15 AM - 11:30 AM. Spouse said pt would not eat for days then consume large amounts of food. I administered crushed medications in apple sauce. I attempted to feed pt multiple times throughout the shift, and she spit out the food. However, pt consumed a total of 10 spoonfuls of applesauce and 1 cup of water during the shift. Pt is a high fall risk. Bed alarm on and sitter present throughout the shift. Peri-care provided twice during shift.

## 2022-01-02 NOTE — ED Notes (Signed)
Sleeping intermitently  less yelling than the previous night affect remains flat mood labile .

## 2022-01-02 NOTE — ED Notes (Signed)
Displays rambling unfocused conversation with poor impulse control requires staff at bedside at all times. Unable to obtaine vital signs because pt is resistant staff attempting vital signs.

## 2022-01-02 NOTE — Progress Notes (Addendum)
This CSW has followed up with Whitney at [facility] regarding in person assessment.   This CSW has also reached out to [facility], [facility], [facility], and [facility] requesting review. TOC following.  Addend @ 2:58 PM TOC supervisor attempted to call [facility] and learned he is out of office until 12/26.

## 2022-01-02 NOTE — ED Notes (Signed)
Sleep intermitently sleeps and awakens to own snoring no signs of respiratory distress occasional  snoring noted.

## 2022-01-03 NOTE — ED Provider Notes (Signed)
Emergency Medicine Observation Re-evaluation Note  Danielle [name] is a 76 y.o. female, seen on rounds today.  Pt initially presented to the ED for complaints of Psychiatric Evaluation Currently, the patient is resting quietly.  Physical Exam  BP (!) 150/83 (BP Location: Right Arm)   Pulse 81   Temp 98 F (36.7 C) (Axillary)   Resp 20   Ht '5\' 3"'$  (1.6 m)   Wt 87.5 kg   SpO2 97%   BMI 34.19 kg/m  Physical Exam General: No acute distress Cardiac: Well-perfused Lungs: Nonlabored Psych: Cooperative  ED Course / MDM  EKG:EKG Interpretation  Date/Time:  Monday December 29 2021 11:06:21 EST Ventricular Rate:  90 PR Interval:  176 QRS Duration: 142 QT Interval:  410 QTC Calculation: 502 R Axis:   41 Text Interpretation: Sinus rhythm Consider right atrial enlargement Right bundle branch block No significant change since last tracing Confirmed by Regan Lemming ([id number]) on 12/29/2021 11:29:14 AM  I have reviewed the labs performed to date as well as medications administered while in observation.  Recent changes in the last 24 hours include medication adjustments.  Plan  Current plan is for nursing home placement.    Danielle Rasmussen, MD 01/03/22 Danielle [name]

## 2022-01-03 NOTE — ED Notes (Signed)
Upon trying to put the pts legs back in the bed and straighten her up, pt tried to kick me and called me a "bitch". Pt is still trying to take her gown off despite being told that she can not lay there exposed.

## 2022-01-03 NOTE — ED Notes (Signed)
Patient alert. Patient confused.  Patient agitated at times, calling out.  Patient redirected and reassured.

## 2022-01-03 NOTE — ED Notes (Signed)
Pt is continuing to yell different things like "help", get me out of here", "lets go", "get out" (even though no one is in the room), different names like Ed, Reggie, and [employer]. Pt is saying hello to someone and reaching her arms out for them. Pt continues sliding down in the bed with her feet hanging off and over the rail. Pt swung at me when trying to pull her up in the bed.

## 2022-01-03 NOTE — ED Notes (Addendum)
Pt had a small bowel movement, purwick changed and in place w/ brief.

## 2022-01-04 NOTE — ED Notes (Signed)
Pt was mostly confused during the shift. Pt cooperative and medication compliant. Pt required total care. Pt is a high fall risk. Sitter present throughout the shift. 10:30 - Pt bathed and bedlinens changed  10:30-11:00 - Pt spouse at bedside 11:00 - NT fed pt breakfast  13:17 - NT fed pt lunch  14:20 - Provided pericare, changed bedlinens, removed purwick, applied brief 16:30 - Pt alert and eyes open. Two people assisted pt to recliner and she fed herself dinner 17:30 - Pt requested to return to the bed, pericare provided, and purwick reapplied  18:00 - Pt yelled out repetitive names or phrases

## 2022-01-04 NOTE — ED Provider Notes (Signed)
Emergency Medicine Observation Re-evaluation Note  Danielle [name] is a 76 y.o. female, seen on rounds today.  Pt initially presented to the ED for complaints of Psychiatric Evaluation Currently, the patient is speaking to herself intermittently but calm.  Physical Exam  BP (!) 146/70 (BP Location: Right Arm)   Pulse 80   Temp (!) 97.4 F (36.3 C) (Oral)   Resp 18   Ht '5\' 3"'$  (1.6 m)   Wt 87.5 kg   SpO2 98%   BMI 34.19 kg/m  Physical Exam General:, Intermittently speaking to self Cardiac: Regular rate Lungs: Breathing comfortably Psych:, Not agitated intermittently speaking to self  ED Course / MDM  EKG:EKG Interpretation  Date/Time:  Monday December 29 2021 11:06:21 EST Ventricular Rate:  90 PR Interval:  176 QRS Duration: 142 QT Interval:  410 QTC Calculation: 502 R Axis:   41 Text Interpretation: Sinus rhythm Consider right atrial enlargement Right bundle branch block No significant change since last tracing Confirmed by Regan Lemming ([id number]) on 12/29/2021 11:29:14 AM  I have reviewed the labs performed to date as well as medications administered while in observation.  Recent changes in the last 24 hours include awaiting placement.  Plan  Current plan is for nursing home placement.    Elgie Congo, MD 01/04/22 [phone]

## 2022-01-04 NOTE — ED Notes (Signed)
Pt. Ate all of her fresh fruit and some of her roast and green beans pt. Stated she was done for now and wants to save the rest for late

## 2022-01-04 NOTE — ED Notes (Signed)
Pt. Was sleep when lunch came she so I let her sleep but her food is in the room

## 2022-01-05 MED ORDER — DICLOFENAC SODIUM 1 % EX GEL
2.0000 g | Freq: Two times a day (BID) | CUTANEOUS | Status: AC | PRN
  Administered 2022-01-06 – 2022-01-07 (×2): 2 g via TOPICAL
  Filled 2022-01-05: qty 100

## 2022-01-05 NOTE — ED Notes (Signed)
Awake at this time rambling conversation .

## 2022-01-05 NOTE — ED Notes (Signed)
Patient alert this shift. Patient cooperative with care. Patient takes medication crushed.  Patient confused and nonsensical at times. Patient rambles at times. Calling out and anxious at times. Patient having noted visual hallucinations. Patient needs assist with ADLs.

## 2022-01-05 NOTE — ED Notes (Signed)
Sleeping intermittant no distress noted

## 2022-01-05 NOTE — ED Notes (Signed)
Pt. Has been up all night trying to get out the bed, calling out people names Mortimer Fries, Loa Socks), speaking but not making clear sense. Pt. Has also been crying out, yelling, scooting to the end of the bed. As of 6:30 am patient has been changed to a clean brief, clean linen, and repositioned. Purewick changed and also applied barrier ointment to red areas on bottom.

## 2022-01-05 NOTE — ED Provider Notes (Signed)
Emergency Medicine Observation Re-evaluation Note  Danielle [name] is a 76 y.o. female, seen on rounds today.  Pt initially presented to the ED for complaints of Psychiatric Evaluation Currently, the patient is medically and psychiatrically cleared awaiting placement. Sleeping at time of my exam.  Physical Exam  BP (!) 155/65 (BP Location: Right Arm)   Pulse 72   Temp 98.2 F (36.8 C) (Oral)   Resp 14   Ht '5\' 3"'$  (1.6 m)   Wt 87.5 kg   SpO2 96%   BMI 34.19 kg/m  Physical Exam General: NAD Cardiac: RR Lungs: even, unlabored Psych: NAD  ED Course / MDM  EKG:EKG Interpretation  Date/Time:  Monday December 29 2021 11:06:21 EST Ventricular Rate:  90 PR Interval:  176 QRS Duration: 142 QT Interval:  410 QTC Calculation: 502 R Axis:   41 Text Interpretation: Sinus rhythm Consider right atrial enlargement Right bundle branch block No significant change since last tracing Confirmed by Regan Lemming ([id number]) on 12/29/2021 11:29:14 AM  I have reviewed the labs performed to date as well as medications administered while in observation.  Recent changes in the last 24 hours include none..  Plan  Current plan is for nursing home placement.    Gareth Morgan, MD 01/05/22 [id number]

## 2022-01-06 MED ORDER — ACETAMINOPHEN 325 MG PO TABS
650.0000 mg | ORAL_TABLET | Freq: Once | ORAL | Status: AC
  Administered 2022-01-06: 650 mg via ORAL
  Filled 2022-01-06: qty 2

## 2022-01-06 NOTE — Social Work (Signed)
CSW called [facility], the facility admin is out today however the secretary stated that she would send to the nurse for review and get back to me. TOC will update as they come.

## 2022-01-06 NOTE — ED Notes (Signed)
Patient kept c/o pain multiple times but was unable to state where her pain is. Notified Dr. Ralene Bathe. New order for one-time dose tylenol. Tried to give pills whole in applesauce. She took a long time with the pills in her mouth. She finally chewed the pills up and followed with sips of water. Will continue monitor.

## 2022-01-06 NOTE — ED Notes (Signed)
Earlier patient did c/o pain in the knees. Notified Dr. Langston Masker. New order for voltaren ordered and given. Will continue to monitor.

## 2022-01-06 NOTE — ED Provider Notes (Signed)
Emergency Medicine Observation Re-evaluation Note  Danielle [name] is a 75 y.o. female, seen on rounds today.  Pt initially presented to the ED for complaints of Psychiatric Evaluation Currently, the patient is sleeping.  Physical Exam  BP 136/64 (BP Location: Right Arm)   Pulse 73   Temp 98.7 F (37.1 C) (Oral)   Resp 18   Ht 1.6 m ('5\' 3"'$ )   Wt 87.5 kg   SpO2 95%   BMI 34.19 kg/m  Physical Exam General: No acute distress Cardiac: Regular rate Lungs: Normal respiratory effort Psych:   ED Course / MDM  EKG:EKG Interpretation  Date/Time:  Monday December 29 2021 11:06:21 EST Ventricular Rate:  90 PR Interval:  176 QRS Duration: 142 QT Interval:  410 QTC Calculation: 502 R Axis:   41 Text Interpretation: Sinus rhythm Consider right atrial enlargement Right bundle branch block No significant change since last tracing Confirmed by Regan Lemming ([id number]) on 12/29/2021 11:29:14 AM  I have reviewed the labs performed to date as well as medications administered while in observation.  Recent changes in the last 24 hours include continued hallucinations, patient requires some assistance with her ADLs.  Plan  Current plan is for nursing home placement.    Dorie Rank, MD 01/06/22 [phone]

## 2022-01-06 NOTE — Progress Notes (Signed)
[facility] will review this patient tomorrow. TOC will contact tomorrow for updates.

## 2022-01-06 NOTE — ED Notes (Signed)
Mrs. Jergens is wide awake and hgaving a rambling conversation with herself, no signs of distress or pain noted.

## 2022-01-07 MED ORDER — STERILE WATER FOR INJECTION IJ SOLN
INTRAMUSCULAR | Status: AC
  Administered 2022-01-07: 2 mL
  Filled 2022-01-07: qty 10

## 2022-01-07 MED ORDER — ACETAMINOPHEN 500 MG PO TABS
1000.0000 mg | ORAL_TABLET | Freq: Once | ORAL | Status: AC
  Administered 2022-01-07: 1000 mg via ORAL
  Filled 2022-01-07: qty 2

## 2022-01-07 NOTE — Progress Notes (Signed)
CSW callled [facility] N/A, [facility] in [place] stated they will review the patient. [facility] in [place] L/V. TOC will continue to update as it comes.

## 2022-01-07 NOTE — ED Provider Notes (Signed)
Emergency Medicine Observation Re-evaluation Note  Danielle [name] is a 76 y.o. female, seen on rounds today.  Pt initially presented to the ED for complaints of Psychiatric Evaluation Currently, the patient is seated in her room talking to herself.  Physical Exam  BP 126/82 (BP Location: Right Arm)   Pulse 82   Temp 98.9 F (37.2 C) (Oral)   Resp 14   Ht '5\' 3"'$  (1.6 m)   Wt 87.5 kg   SpO2 99%   BMI 34.19 kg/m  Physical Exam General: Speaking to herself throughout the exam Lungs: Breathing comfortably on room air Psych: Calm  ED Course / MDM  EKG:EKG Interpretation  Date/Time:  Monday December 29 2021 11:06:21 EST Ventricular Rate:  90 PR Interval:  176 QRS Duration: 142 QT Interval:  410 QTC Calculation: 502 R Axis:   41 Text Interpretation: Sinus rhythm Consider right atrial enlargement Right bundle branch block No significant change since last tracing Confirmed by Regan Lemming ([id number]) on 12/29/2021 11:29:14 AM  I have reviewed the labs performed to date as well as medications administered while in observation.  Recent changes in the last 24 hours include patient continue to speak to herself and calling out the names of family members and friends concerns for ongoing hallucinations.  Plan  Current plan is for placement.    Fransico Meadow, MD 01/07/22 [phone]

## 2022-01-07 NOTE — ED Notes (Addendum)
Multiple attempts to get patient to take her medications with apple sauce and she refused. She clinched her mouth shut and spit out what we got in her mouth.

## 2022-01-07 NOTE — Progress Notes (Signed)
[facility] will review patient clinical notes faxed over. Patients information faxed over to [facility] in [place] as well, TOC will keep updating as it comes.

## 2022-01-07 NOTE — Progress Notes (Addendum)
TOC to contact [facility], [facility], and all other pending placements to request review of the pt's referral.   [facility]- Declined.  [facility]- No response. [facility]- Left VM. [facility]- Perrin Smack will review.  [facility] ([place])- Linus Orn will review. [facility])- Admissions out - unsure when they will return.  [facility] ([place])- No answer.  [facility]- No beds. [facility] referral.

## 2022-01-07 NOTE — Progress Notes (Addendum)
Facilities that were called  [facility]  [facility] declined   [facility] number not in service.   [facility]/ NA

## 2022-01-07 NOTE — Progress Notes (Signed)
PT Cancellation Note  Patient Details Name: Danielle [name] MRN: [id number] DOB: 1945/05/15   Cancelled Treatment:    Reason Eval/Treat Not Completed: Other (comment) Spoke with RN who reports pt was up all night and has been given meds to help her sleep.  States that she just went to sleep and requested to let her rest. PT agrees. Will f/u at later date. Abran Richard, PT Acute Rehab [facility] [phone]   Karlton Lemon 01/07/2022, 3:26 PM

## 2022-01-07 NOTE — ED Notes (Signed)
Occasionally yells out but is redirectable and response appropriate to questions.

## 2022-01-07 NOTE — ED Notes (Signed)
Continues to have rambling conversations call out names possible of family members of friends response appropriate to questions then returns to rambling conversation.

## 2022-01-08 DIAGNOSIS — E1169 Type 2 diabetes mellitus with other specified complication: Secondary | ICD-10-CM | POA: Diagnosis not present

## 2022-01-08 DIAGNOSIS — F3341 Major depressive disorder, recurrent, in partial remission: Secondary | ICD-10-CM | POA: Diagnosis not present

## 2022-01-08 DIAGNOSIS — K219 Gastro-esophageal reflux disease without esophagitis: Secondary | ICD-10-CM | POA: Diagnosis not present

## 2022-01-08 DIAGNOSIS — I10 Essential (primary) hypertension: Secondary | ICD-10-CM | POA: Diagnosis not present

## 2022-01-08 DIAGNOSIS — E039 Hypothyroidism, unspecified: Secondary | ICD-10-CM | POA: Diagnosis not present

## 2022-01-08 DIAGNOSIS — E785 Hyperlipidemia, unspecified: Secondary | ICD-10-CM | POA: Diagnosis not present

## 2022-01-08 DIAGNOSIS — M1990 Unspecified osteoarthritis, unspecified site: Secondary | ICD-10-CM | POA: Diagnosis not present

## 2022-01-08 LAB — URINALYSIS, ROUTINE W REFLEX MICROSCOPIC
Bilirubin Urine: NEGATIVE
Glucose, UA: NEGATIVE mg/dL
Ketones, ur: NEGATIVE mg/dL
Nitrite: NEGATIVE
Protein, ur: 100 mg/dL — AB
Specific Gravity, Urine: 1.01 (ref 1.005–1.030)
WBC, UA: >50 WBC/hpf — ABNORMAL HIGH (ref 0–5)
pH: 7 (ref 5.0–8.0)

## 2022-01-08 LAB — CBC WITH DIFFERENTIAL/PLATELET
Abs Immature Granulocytes: 0.04 10*3/uL (ref 0.00–0.07)
Basophils Absolute: 0.1 10*3/uL (ref 0.0–0.1)
Basophils Relative: 1 %
Eosinophils Absolute: 0.3 10*3/uL (ref 0.0–0.5)
Eosinophils Relative: 3 %
HCT: 44.5 % (ref 36.0–46.0)
Hemoglobin: 14.7 g/dL (ref 12.0–15.0)
Immature Granulocytes: 1 %
Lymphocytes Relative: 21 %
Lymphs Abs: 1.8 10*3/uL (ref 0.7–4.0)
MCH: 29.8 pg (ref 26.0–34.0)
MCHC: 33 g/dL (ref 30.0–36.0)
MCV: 90.1 fL (ref 80.0–100.0)
Monocytes Absolute: 0.8 10*3/uL (ref 0.1–1.0)
Monocytes Relative: 9 %
Neutro Abs: 5.8 10*3/uL (ref 1.7–7.7)
Neutrophils Relative %: 65 %
Platelets: 295 10*3/uL (ref 150–400)
RBC: 4.94 MIL/uL (ref 3.87–5.11)
RDW: 13.3 % (ref 11.5–15.5)
WBC: 8.8 10*3/uL (ref 4.0–10.5)
nRBC: 0 % (ref 0.0–0.2)

## 2022-01-08 LAB — BASIC METABOLIC PANEL
Anion gap: 10 (ref 5–15)
BUN: 14 mg/dL (ref 8–23)
CO2: 23 mmol/L (ref 22–32)
Calcium: 9.7 mg/dL (ref 8.9–10.3)
Chloride: 105 mmol/L (ref 98–111)
Creatinine, Ser: 1.02 mg/dL — ABNORMAL HIGH (ref 0.44–1.00)
GFR, Estimated: 57 mL/min — ABNORMAL LOW (ref >60)
Glucose, Bld: 108 mg/dL — ABNORMAL HIGH (ref 70–99)
Potassium: 3.6 mmol/L (ref 3.5–5.1)
Sodium: 138 mmol/L (ref 135–145)

## 2022-01-08 LAB — LACTIC ACID, PLASMA: Lactic Acid, Venous: 1.6 mmol/L (ref 0.5–1.9)

## 2022-01-08 MED ORDER — STERILE WATER FOR INJECTION IJ SOLN
INTRAMUSCULAR | Status: AC
  Filled 2022-01-08: qty 10

## 2022-01-08 MED ORDER — ACETAMINOPHEN 325 MG PO TABS
650.0000 mg | ORAL_TABLET | Freq: Once | ORAL | Status: AC
  Administered 2022-01-08: 650 mg via ORAL
  Filled 2022-01-08: qty 2

## 2022-01-08 MED ORDER — CIPROFLOXACIN HCL 500 MG PO TABS
500.0000 mg | ORAL_TABLET | Freq: Two times a day (BID) | ORAL | Status: DC
  Administered 2022-01-08 – 2022-01-10 (×6): 500 mg via ORAL
  Filled 2022-01-08 (×6): qty 1

## 2022-01-08 NOTE — Progress Notes (Signed)
Physical Therapy Treatment Patient Details Name: Danielle [name] MRN: [id number] DOB: [birth year] Today's Date: 01/08/2022   History of Present Illness 76 year old female with medical history significant for diagnosis of pseudodementia, depression, anxiety who presents to the emergency department 12/29/21 with concern for abnormal behaviors, recently in hospital for same.    PT Comments    Patient  noted to be moving  around in bed. Patient assisted to sitting and then standing a t bed side. Patient able to take a few side steps, patient did not desire to go forwards. ASsisted back into bed.   Recommendations for follow up therapy are one component of a multi-disciplinary discharge planning process, led by the attending physician.  Recommendations may be updated based on patient status, additional functional criteria and insurance authorization.  Follow Up Recommendations  Skilled nursing-short term rehab (<3 hours/day) Can patient physically be transported by private vehicle: No   Assistance Recommended at Discharge Frequent or constant Supervision/Assistance  Patient can return home with the following Two people to help with walking and/or transfers;Two people to help with bathing/dressing/bathroom;Assistance with cooking/housework   Equipment Recommendations  None recommended by PT    Recommendations for Other Services       Precautions / Restrictions Precautions Precautions: Fall Precaution Comments: safety, mitts     Mobility  Bed Mobility Overal bed mobility: Needs Assistance Bed Mobility: Sidelying to Sit, Sit to Sidelying   Sidelying to sit: Mod assist     Sit to sidelying: +2 for physical assistance, Max assist, +2 for safety/equipment General bed mobility comments: patient noted  mobilizing self in bed, lying on right side, max assist to sit upright, caution taken as patient ver close to bed edge. assist with trunk andf legs to return to side.     Transfers Overall transfer level: Needs assistance Equipment used: Rolling walker (2 wheels) Transfers: Sit to/from Stand Sit to Stand: Mod assist, +2 safety/equipment           General transfer comment: Patient able to  stand at [facility] , able to side steps x 4  along bed  edge.    Ambulation/Gait                   Stairs             Wheelchair Mobility    Modified Rankin (Stroke Patients Only)       Balance Overall balance assessment: Needs assistance Sitting-balance support: Feet supported, Bilateral upper extremity supported Sitting balance-Leahy Scale: Poor Sitting balance - Comments: unable to assess   Standing balance support: Bilateral upper extremity supported, During functional activity, Reliant on assistive device for balance Standing balance-Leahy Scale: Poor                              Cognition Arousal/Alertness: Awake/alert Behavior During Therapy: Restless, Anxious Overall Cognitive Status: No family/caregiver present to determine baseline cognitive functioning Area of Impairment: Orientation, Attention, Following commands, Awareness                 Orientation Level: Place, Time, Situation Current Attention Level: Focused   Following Commands: Follows one step commands inconsistently   Awareness: Intellectual   General Comments: patient did follow simple 1 step directions, inconsistently, but able to participate more effectively        Exercises      General Comments        Pertinent Vitals/Pain Pain Assessment Faces Pain Scale:  Hurts little more Pain Location: unsure Pain Descriptors / Indicators: Discomfort, Grimacing, Moaning Pain Intervention(s): Monitored during session    Home Living                          Prior Function            PT Goals (current goals can now be found in the care plan section) Progress towards PT goals: Progressing toward goals    Frequency    Min  2X/week      PT Plan Current plan remains appropriate    Co-evaluation              AM-PAC PT "6 Clicks" Mobility   Outcome Measure  Help needed turning from your back to your side while in a flat bed without using bedrails?: A Lot Help needed moving from lying on your back to sitting on the side of a flat bed without using bedrails?: Total Help needed moving to and from a bed to a chair (including a wheelchair)?: A Lot Help needed standing up from a chair using your arms (e.g., wheelchair or bedside chair)?: A Lot Help needed to walk in hospital room?: Total Help needed climbing 3-5 steps with a railing? : Total 6 Click Score: 9    End of Session Equipment Utilized During Treatment: Gait belt Activity Tolerance: Patient tolerated treatment well Patient left: in bed;with nursing/sitter in room Nurse Communication: Mobility status PT Visit Diagnosis: Unsteadiness on feet (R26.81)     Time: 6701-4103 PT Time Calculation (min) (ACUTE ONLY): 13 min  Charges:  $Therapeutic Activity: 8-22 mins                     [place] Office [phone] Weekend pager-[phone]    Claretha Cooper 01/08/2022, 10:39 AM

## 2022-01-08 NOTE — ED Notes (Addendum)
TTS consulted again as patient continues to display signs of both visual and auditory hallucinations. Patient is not sleeping at night and is having rambling conversations with herself per staff. Patient continues to be agitated in general and is not at her baseline.

## 2022-01-08 NOTE — Progress Notes (Signed)
Physical Therapy Treatment Patient Details Name: Danielle [name] MRN: [id number] DOB: July 20, 1945 Today's Date: 01/08/2022   History of Present Illness 76 year old female with medical history significant for diagnosis of pseudodementia, depression, anxiety who presents to the emergency department 12/29/21 with concern for abnormal behaviors, recently in hospital for same.    PT Comments     Patient is awake, jargon speech, indicating pain in  the back. Patient moving around in bed, somewhat resistive to attempts to roll and sit up onto bed edge. Continue PT efforts for mobility. Nursing reports that patient stood from a =bed and walked  with 2 handhold.  Recommendations for follow up therapy are one component of a multi-disciplinary discharge planning process, led by the attending physician.  Recommendations may be updated based on patient status, additional functional criteria and insurance authorization.  Follow Up Recommendations  Skilled nursing-short term rehab (<3 hours/day) Can patient physically be transported by private vehicle: No   Assistance Recommended at Discharge Frequent or constant Supervision/Assistance  Patient can return home with the following Two people to help with walking and/or transfers;Two people to help with bathing/dressing/bathroom;Assistance with cooking/housework   Equipment Recommendations  None recommended by PT    Recommendations for Other Services       Precautions / Restrictions Precautions Precautions: Fall Precaution Comments: safety, mitts     Mobility  Bed Mobility Overal bed mobility: Needs Assistance Bed Mobility: Rolling           General bed mobility comments: multiple attempts to roll and sit up, patient resistive to attempts, yells out.    Transfers                   General transfer comment: unable to test    Ambulation/Gait                   Stairs             Wheelchair Mobility     Modified Rankin (Stroke Patients Only)       Balance       Sitting balance - Comments: unable to assess                                    Cognition Arousal/Alertness: Awake/alert Behavior During Therapy: Restless, Anxious Overall Cognitive Status: No family/caregiver present to determine baseline cognitive functioning Area of Impairment: Orientation, Attention, Following commands, Awareness                 Orientation Level: Place, Time, Situation Current Attention Level: Focused   Following Commands: Follows one step commands inconsistently   Awareness: Intellectual   General Comments: patient is restless, jargon speech, not sensible        Exercises      General Comments        Pertinent Vitals/Pain Pain Assessment Faces Pain Scale: Hurts little more Pain Location: difficult to determine, ? back, speech is jargon, mostly Pain Descriptors / Indicators: Discomfort, Grimacing, Moaning Pain Intervention(s): Monitored during session, Limited activity within patient's tolerance    Home Living                          Prior Function            PT Goals (current goals can now be found in the care plan section) Progress towards PT goals: Progressing toward goals  Frequency    Min 2X/week      PT Plan Current plan remains appropriate    Co-evaluation              AM-PAC PT "6 Clicks" Mobility   Outcome Measure  Help needed turning from your back to your side while in a flat bed without using bedrails?: Total Help needed moving from lying on your back to sitting on the side of a flat bed without using bedrails?: Total Help needed moving to and from a bed to a chair (including a wheelchair)?: Total Help needed standing up from a chair using your arms (e.g., wheelchair or bedside chair)?: Total Help needed to walk in hospital room?: Total Help needed climbing 3-5 steps with a railing? : Total 6 Click Score:  6    End of Session   Activity Tolerance:  (limited by  ability to participate) Patient left: in bed Nurse Communication: Mobility status PT Visit Diagnosis: Unsteadiness on feet (R26.81)     Time: 1245-8099 PT Time Calculation (min) (ACUTE ONLY): 14 min  Charges:  $Therapeutic Activity: 8-22 mins                     [place] Office [phone] Weekend pager-[phone]    Claretha Cooper 01/08/2022, 10:30 AM

## 2022-01-08 NOTE — Progress Notes (Addendum)
This CSW contacted Danielle [name] to speak with him following the options discussed with [facility] [employer] rep. Danielle informed "we are working on getting hospital bed before we bring her home." This CSW informed that we can assist with getting the hospital bed. The pt's son spoke up and asked "Can we talk about this on Tuesday?" This CSW informed that the pt is medically and psychiatrically stable and would not need to board in the [facility]le until Tuesday and we could get the hospital bed likely before the weekend. Danielle stated "look, don't hassle me about this." The pt's son then followed up and said "If you can get the hospital bed then we'll come get her- whatever." This CSW asked to verify if the hospital bed is the only barrier to them picking the pt up. An unknown female stated "Call us back when you can get the hospital bed." This CSW informed Danielle, the pt's son, Danielle [name], and the female that we will begin working to order the hospital bed and will be in touch. TOC to contact EDP via secure chat to request [facility] order for hospital bed.   Addend @ 4:37 PM TOC noted continued agitation and RN CM reached out to EDP via secure chat to inquire about having pysch re-engage prior to d/c.   Addend @ 5:20 PM Asst Danielle Director, Danielle Poles, RN, will place TTS consult.  Addend @ 6:37 PM This CSW received a call from [place] with [facility] [employer] who informed that the pt's spouse, Danielle, contacted her and informed he is choosing to not move forward with her assistance and/or recommendations at this time. TOC will continue to follow for TTS recommendations. If pt is cleared, TOC will coordinate any DME needs. Family has requested a hospital bed at time of discharge.

## 2022-01-08 NOTE — Progress Notes (Signed)
This CSW has called to follow up with Linus Orn, admissions Mudlogger, at [facility]. Left HIPAA compliant voicemail.

## 2022-01-08 NOTE — ED Notes (Signed)
Unable to obtain VS on pt d/t agitation. Pt repeatedly taking off pulse oximeter and placing in mouth. Pt unable to keep her arm still for BP to read.

## 2022-01-08 NOTE — Progress Notes (Signed)
Late documentation: 4:27pm This RNCM notified EDP, RN via secure chat of patient's family request for hospital bed. This RNCM request [facility] services and request psych re-eval due to patient most recent agitation per chart review. Order for DME (hospital bed) and TTS order have been placed.  TOC will continue to follow.

## 2022-01-08 NOTE — Progress Notes (Addendum)
This CSW attempted to contact Ed Bignell (spouse) and Ed asked this CSW to call back within 15 minutes. This CSW attempted to contact again 20 minutes later and left HIPAA Compliant voicemail. This CSW will attempt to contact the pt's son.   Addend @ 2:00 PM This CSW contacted the pt's son who states he does not have $10,000 a month to pay for the pt to be in a facility. He says he would like to speak with Whitney to provide her with financial information and make an informed decision. He states he is unable to take care of the pt due to rage.   Addend @ 4:22 PM Whitney informed this CSW of the following via email:   "Here's where we stand with the [facility].     01/01/22 - Referral received 01/01/22 - Spoke with Mr. Burtis - he did not have any information on their finances and stated that he would call me back on 01/02/22 with that information 01/01/22 PM - Spoke with [place] regarding dementia diagnosis or lack thereof, as well as behaviors and the challenges to place Mrs. Loyal Buba. 01/02/22 - no return call from Mr. Forster - left him a voicemail 01/03/22 - no return call from Mr. Almanzar - left him a voicemail in the morning & evening 01/08/22 - Discussed lack of response with Corey Skains & made plans to reach back out to Mr. Shuffield 01/08/22 - After multiple attempts, connected with Mr. Filippone regarding their financial situation. He will speak with Mrs. Westerfield son, [name], and call me back tonight to discuss plan    Here's the best options for this family regarding discharge:  DC home with the husband resuming care - per the husband, this is not an option because he is a 100% disabled [place] Veteran with PTSD who has had 2 CABG's over the last year. He states that he is no able to manage his wife's ADLs from a physical or mental perspective DC home with [facility] in place - family could only afford the minimum 12 hrs per week Mrs. Stivers moves to [place] - this will only be possible if Memory Care will accept the  diagnosis, as listed, on the notes AND if the son can supplement the cost at approximately $3000-$4000 per month Mr & Mrs Justiniano sell their home & move into a [place] (Mr. Wall in [place] or [place] & Mrs. Bradway in [place]) - this is the most feasible option with their financial situation & care needs; however; Mr. Setzer does not want to lose his home    For a reference point, their financial situation is explained below:  Combined Monthly Income ($11,000) surpasses LTC Medicaid guidelines Savings is only enough to cover their living expenses Owns home with a mortgage - could probably make $75k on the sale"

## 2022-01-08 NOTE — ED Notes (Signed)
IV catheter and tubing found out floor in patient's room.  L AC observed and no signs of redness/bleeding.

## 2022-01-08 NOTE — ED Notes (Addendum)
Pt appears agitated- increased rambling, yelling "help me" and turning back and forth in the bed, attempting to get the bed rail down. PRN medication given at this time

## 2022-01-08 NOTE — ED Provider Notes (Signed)
  Physical Exam  BP 114/73 (BP Location: Right Arm)   Pulse 94   Temp 98.2 F (36.8 C) (Oral)   Resp 16   Ht '5\' 3"'$  (1.6 m)   Wt 87.5 kg   SpO2 95%   BMI 34.19 kg/m   Physical Exam  Procedures  Procedures  ED Course / MDM    Medical Decision Making Amount and/or Complexity of Data Reviewed Labs: ordered. Radiology: ordered.  Risk OTC drugs. Prescription drug management.   Continued pain for placement.  Now however has developed a UTI.  Started on Cipro.  Lab work reassuring.  Continued confusion.       Davonna Belling, MD 01/08/22 [phone]

## 2022-01-08 NOTE — ED Notes (Addendum)
I&O cath completed with urinalysis sent to lab. 550 ml output. High probability UTI. Patient also c/o pain. Notified Dr. Florina Ou. New urine culture ordered and sent to the lab.  Tylenol ordered and given. Will continue to monitor.

## 2022-01-08 NOTE — ED Notes (Signed)
Safety sitter assisted pt with eating lunch

## 2022-01-08 NOTE — BH Assessment (Signed)
Disposition: Evette Georges, NP recommends continuous observation for patient to be reassessed by psychiatry on 12/29. Dr. Ezequiel Essex and RN Natasha Bence made aware of recommendation  Clinician attempted to complete TTS tele-assessment, however assessment unable to be completed due to lack of engagement from patient. When clinician tried to engage patient, she closed her eyes and began continuously rambling numbers. RN reports Pt has been doing this for at least two hours.  Darra Lis, MSW, LCSW Triage Specialist [phone]

## 2022-01-08 NOTE — ED Notes (Signed)
Patient only slept about 1 hour. She constantly yells out and seemed more confused than usual. Will continue to monitor.

## 2022-01-09 LAB — URINE CULTURE

## 2022-01-09 MED ORDER — ACETAMINOPHEN 325 MG PO TABS
650.0000 mg | ORAL_TABLET | Freq: Four times a day (QID) | ORAL | Status: DC | PRN
  Administered 2022-01-09 – 2022-01-27 (×10): 650 mg via ORAL
  Filled 2022-01-09 (×16): qty 2

## 2022-01-09 NOTE — Progress Notes (Addendum)
[name]  was screened in, [facility] will continue to follow

## 2022-01-09 NOTE — ED Notes (Signed)
The pt has been able to engage in meaningful conversation this morning however during same conversation began to ramble again.

## 2022-01-09 NOTE — ED Notes (Signed)
Attempted to check if patient was soiled.  Patient would not let RN assess her for incontinents.  Patient pulled at gown and would not let RN move gown.  Will attempt to assess at later time

## 2022-01-09 NOTE — Progress Notes (Addendum)
This CSW has attempted to contact APS twice to file a report without success. TOC to continue trying.   Report is being filed due to the pt's spouse stating he is unable to afford to private pay for the care the pt needs. Spouse reports that the pt receives over $4,000 per month. Combined, they have income of $11,000+ per month and he has reported there is no money saved and he is unable to private pay for her to be in a facility.   Addend @ 1:11 PM APS report filed. APS to call back to inform whether case has been screened in or out.

## 2022-01-09 NOTE — Progress Notes (Cosign Needed)
Patient suffers from chronic lower back and upper left thigh pain which is caused by fall on 10/27/21. Hospital bed will alleviate by allowing lower extremities/ hips to be positioned in ways not feasible with a normal bed. Pain episodes frequently require frequent and immediate changes in body position which cannot be achieved with a normal bed.

## 2022-01-09 NOTE — ED Notes (Signed)
TTS assessment attempted.  Computer placed in room.  Provider made aware that patient might not respond to questioning at this time

## 2022-01-09 NOTE — Progress Notes (Addendum)
This RNCM received secure chat that patient's husband wants to speak with this RNCM and the address on file is incorrect address for hospital bed to be sent to. This RNCM left voicemail message for patient's husband Danielle [name] to call this RNCM back.  This RNCM notified Danielle [name] with [facility] to delay bed delivery due to patient's husband now reports the address on file is not correct address for DME: hospital bed delivery.   TOC will await a call back from patient's husband to determine recent address change.  - 3:30pm This RNCM spoke with Danielle [name] at [facility] who advised she spoke with patient's husband Danielle [name] who confirmed with [employer] that the address on file is correct and is the correct address for hospital bed delivery.

## 2022-01-09 NOTE — ED Notes (Signed)
Patient adult brief changed and bed sheets checked.  Patient allowed PureWick to be placed.  Patient pleasant this morning and able to answer yes and no questions.

## 2022-01-09 NOTE — Consult Note (Signed)
[facility] ED ASSESSMENT   Reason for Consult:  patient continues to have visual and auditory hallucinations and increasingly agitated  Referring Physician:  Dr. Wyvonnia Dusky Patient Identification: Danielle [name] MRN:  [id number] ED Chief Complaint: Pseudodementia  Diagnosis:  Principal Problem:   Pseudodementia Active Problems:   Altered mental status, unspecified   ED Assessment Time Calculation: Start Time: 1900 Stop Time: 1930 Total Time in Minutes (Assessment Completion): 30   HPI: Per triage note patient brought in by EMS from home for psychotic episodes.  Patient stopped taking medications cold [place] and sleep meds have been making things worse per her husband.  Patient complaining of back pain and abdominal pain along with urinary incontinence.   Subjective: Danielle [name], 76 y.o., female patient seen face to face by this provider, consulted with Dr. Dwyane Dee; and chart reviewed on 01/09/22.  During evaluation [name] is laying in her hospital bed in no acute distress. She is alert, oriented to name and her DOB, calm presently but can become anxious and verbally loud and rambles, cooperative. Her mood is anxious with flat affect. She has norma speech, will occasionally yell out different things, bu is redirectable at times. Patient is not able to answer all questions appropriately. Patient has been diagnosed with pseudo-dementia.  Patient is not able to do her ADLs on her own, needs assistance from staff.  Past Psychiatric History: previous hx of Depression, anxiety, Cognitive decline. Patient has outpatient Psychiatrist. No information regarding inpatient Psychiatry hospitalization.    Risk to Self or Others: Is the patient at risk to self? No Has the patient been a risk to self in the past 6 months? No Has the patient been a risk to self within the distant past? No Is the patient a risk to others? No Has the patient been a risk to others in the past 6 months? No Has the  patient been a risk to others within the distant past? No  [place] Scale:  [place] ED from 12/29/2021 in [place] DEPT ED from 12/09/2021 in [place] DEPT Admission (Discharged) from 11/26/2021 in [place] No Risk No Risk No Risk       AIMS:  , , ,  ,   ASAM:    Substance Abuse:     Past Medical History:  Past Medical History:  Diagnosis Date   Arthritis 02/13/2011   osteoarthritis, hips, knees,s/p Cervical fusion(DDD)   Atherosclerosis of aorta ([place]) 05/30/2015   on CTA chest   CAD (coronary artery disease)    a. 09/2016: Coronary CT showing mid-LAD plaque with 20-50% associated stenosis but no significant stenosis by FFR analysis.    Dementia ([place])    Depression with anxiety    Diabetes mellitus without complication ([place])    Difficult intubation 02/13/2011   with surgery -multiple times, no problems with recent surgeries   GERD (gastroesophageal reflux disease) 02/13/2011   tx. Omeprazole   Glaucoma 02/13/2011   bil. tx. eye drops daily   Headache(784.0) 02/13/2011   past hx. migraines, none recent   Heart murmur 02/13/2011   was told-no issues with this   Hypercholesterolemia    Hypertension 02/13/2011   Hypothyroidism 02/13/2011   tx. with Levothyroxine   Major depressive disorder    Mitral regurgitation 06/01/2015   Mild   Sinus drainage 02/13/2011   uses Zyrtec as needed   Sleep apnea 02/13/2011   Hx. sleep apnea-uses cpap since 10'12  nightly    Past Surgical History:  Procedure Laterality Date   ABDOMINAL HYSTERECTOMY  02/13/2011   APPENDECTOMY  02/13/2011   Lap. removal '92   BREAST CYST EXCISION Left    CERVICAL FUSION  02/13/2011   '92- retained hardware   CHOLECYSTECTOMY  02/13/2011   '98-lap. galbladder removal due to stones   GUM SURGERY  02/13/2011   "small mouth"   JOINT REPLACEMENT  02/13/2011   RTHA-a yr ago in [place] Left 08/15/2019   Procedure: EXCISION OF SEBACEOUS CYST LEFT BREAST;  Surgeon: Georganna Skeans, MD;  Location: [facility];  Service: General;  Laterality: Left;   TONSILLECTOMY  02/13/2011   child   TOTAL HIP ARTHROPLASTY  02/17/2011   Procedure: TOTAL HIP ARTHROPLASTY ANTERIOR APPROACH;  Surgeon: Mauri Pole, MD;  Location: [facility];  Service: Orthopedics;  Laterality: Left;   TUBAL LIGATION  02/13/2011   Family History:  Family History  Problem Relation Age of Onset   Cancer Mother        lung   Breast cancer Maternal Aunt    Alzheimer's disease Neg Hx    Dementia Neg Hx     Social History:  Social History   Substance and Sexual Activity  Alcohol Use Yes   Comment: rare     Social History   Substance and Sexual Activity  Drug Use No    Social History   Socioeconomic History   Marital status: Married    Spouse name: Not on file   Number of children: Not on file   Years of education: Not on file   Highest education level: Not on file  Occupational History   Occupation: Retired Not working  Tobacco Use   Smoking status: Never   Smokeless tobacco: Never  Vaping Use   Vaping Use: Never used  Substance and Sexual Activity   Alcohol use: Yes    Comment: rare   Drug use: No   Sexual activity: Yes    Birth control/protection: Surgical    Comment: hyst  Other Topics Concern   Not on file  Social History Narrative   Caffeine le-ss then once cup daily. Education: HS diploma.  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     Social Determinants of Health   Financial Resource Strain: Not on file  Food Insecurity: No Food Insecurity (11/26/2021)   Hunger Vital Sign    Worried About Running Out of Food in the Last Year: Never true    Ran Out of Food in the Last Year: Never true  Transportation Needs: No Transportation Needs (11/26/2021)   PRAPARE - Hydrologist (Medical): No    Lack of  Transportation (Non-Medical): No  Physical Activity: Not on file  Stress: Not on file  Social Connections: Not on file    Allergies:   Allergies  Allergen Reactions   Ceftin [Cefuroxime Axetil] Hives, Swelling and Other (See Comments)    Swelling of face   Codeine Nausea Only   Compazine [Prochlorperazine] Swelling and Other (See Comments)    Face and tongue swelling    Labs:  Results for orders placed or performed during the hospital encounter of 12/29/21 (from the past 48 hour(s))  Urinalysis, Routine w reflex microscopic Urine, Clean Catch     Status: Abnormal   Collection Time: 01/08/22  2:40 AM  Result Value Ref Range   Color, Urine YELLOW YELLOW   APPearance CLOUDY (  A) CLEAR   Specific Gravity, Urine 1.010 1.005 - 1.030   pH 7.0 5.0 - 8.0   Glucose, UA NEGATIVE NEGATIVE mg/dL   Hgb urine dipstick LARGE (A) NEGATIVE   Bilirubin Urine NEGATIVE NEGATIVE   Ketones, ur NEGATIVE NEGATIVE mg/dL   Protein, ur 100 (A) NEGATIVE mg/dL   Nitrite NEGATIVE NEGATIVE   Leukocytes,Ua LARGE (A) NEGATIVE   RBC / HPF 21-50 0 - 5 RBC/hpf   WBC, UA >50 (H) 0 - 5 WBC/hpf   Bacteria, UA MANY (A) NONE SEEN   WBC Clumps PRESENT    Mucus PRESENT     Comment: Performed at [facility] [place] [address], [place], [place] [address]  Urine Culture     Status: Abnormal   Collection Time: 01/08/22  2:40 AM   Specimen: Urine, Clean Catch  Result Value Ref Range   Specimen Description      URINE, CLEAN CATCH Performed at [facility], [place] [address] [place], [place] [address]    Special Requests      NONE Performed at [facility], [place] [address] [place], [place] [address]    Culture MULTIPLE SPECIES PRESENT, SUGGEST RECOLLECTION (A)    Report Status 01/09/2022 FINAL   CBC with Differential     Status: None   Collection Time: 01/08/22  8:12 AM  Result Value Ref Range   WBC 8.8 4.0 - 10.5 K/uL   RBC 4.94 3.87 - 5.11 MIL/uL    Hemoglobin 14.7 12.0 - 15.0 g/dL   HCT 44.5 36.0 - 46.0 %   MCV 90.1 80.0 - 100.0 fL   MCH 29.8 26.0 - 34.0 pg   MCHC 33.0 30.0 - 36.0 g/dL   RDW 13.3 11.5 - 15.5 %   Platelets 295 150 - 400 K/uL   nRBC 0.0 0.0 - 0.2 %   Neutrophils Relative % 65 %   Neutro Abs 5.8 1.7 - 7.7 K/uL   Lymphocytes Relative 21 %   Lymphs Abs 1.8 0.7 - 4.0 K/uL   Monocytes Relative 9 %   Monocytes Absolute 0.8 0.1 - 1.0 K/uL   Eosinophils Relative 3 %   Eosinophils Absolute 0.3 0.0 - 0.5 K/uL   Basophils Relative 1 %   Basophils Absolute 0.1 0.0 - 0.1 K/uL   Immature Granulocytes 1 %   Abs Immature Granulocytes 0.04 0.00 - 0.07 K/uL    Comment: Performed at [facility], [place] [address], [place], [place] [address]  Basic metabolic panel     Status: Abnormal   Collection Time: 01/08/22  8:12 AM  Result Value Ref Range   Sodium 138 135 - 145 mmol/L   Potassium 3.6 3.5 - 5.1 mmol/L   Chloride 105 98 - 111 mmol/L   CO2 23 22 - 32 mmol/L   Glucose, Bld 108 (H) 70 - 99 mg/dL    Comment: Glucose reference range applies only to samples taken after fasting for at least 8 hours.   BUN 14 8 - 23 mg/dL   Creatinine, Ser 1.02 (H) 0.44 - 1.00 mg/dL   Calcium 9.7 8.9 - 10.3 mg/dL   GFR, Estimated 57 (L) >60 mL/min    Comment: (NOTE) Calculated using the CKD-EPI Creatinine Equation (2021)    Anion gap 10 5 - 15    Comment: Performed at [facility], [place] [address], [place], [place] [address]  Lactic acid, plasma     Status: None   Collection Time: 01/08/22  8:12 AM  Result Value Ref Range   Lactic Acid, Venous 1.6 0.5 - 1.9 mmol/L    Comment: Performed at [facility], [place] [address] [place], [place] [address]    Current Facility-Administered Medications  Medication Dose Route Frequency Provider Last Rate Last Admin   acetaminophen (TYLENOL) tablet 650 mg  650 mg Oral Q6H PRN Elgie Congo, MD   650 mg at 01/09/22 1848   ciprofloxacin (CIPRO)  tablet 500 mg  500 mg Oral BID Molpus, John, MD   500 mg at 01/09/22 6962   diclofenac Sodium (VOLTAREN) 1 % topical gel 2 g  2 g Topical BID PRN Wyvonnia Dusky, MD   2 g at 01/07/22 2220   FLUoxetine (PROZAC) capsule 40 mg  40 mg Oral Daily Godfrey Pick, MD   40 mg at 01/09/22 0953   hydrOXYzine (ATARAX) tablet 25 mg  25 mg Oral Q6H PRN Godfrey Pick, MD   25 mg at 01/08/22 0844   levothyroxine (SYNTHROID) tablet 137 mcg  137 mcg Oral Q0600 Godfrey Pick, MD   137 mcg at 01/09/22 0653   metoprolol tartrate (LOPRESSOR) tablet 12.5 mg  12.5 mg Oral Q2000 Godfrey Pick, MD   12.5 mg at 01/09/22 1919   QUEtiapine (SEROQUEL) tablet 100 mg  100 mg Oral QHS Godfrey Pick, MD   100 mg at 01/08/22 2316   QUEtiapine (SEROQUEL) tablet 25 mg  25 mg Oral BID Derrill Center, NP   25 mg at 01/09/22 9528   senna-docusate (Senokot-S) tablet 1 tablet  1 tablet Oral QHS Godfrey Pick, MD   1 tablet at 01/08/22 2316   traZODone (DESYREL) tablet 100 mg  100 mg Oral QHS PRN Godfrey Pick, MD   100 mg at 01/08/22 2316   ziprasidone (GEODON) injection 10 mg  10 mg Intramuscular PRN Campbell Stall P, DO   10 mg at 01/08/22 1139   Current Outpatient Medications  Medication Sig Dispense Refill   bisacodyl (DULCOLAX) 5 MG EC tablet Take 5 mg by mouth daily as needed for mild constipation or moderate constipation.     CALCIUM PO Take 1 tablet by mouth every 7 (seven) days.     cariprazine (VRAYLAR) 1.5 MG capsule Take 1 capsule (1.5 mg total) by mouth daily. 30 capsule 1   FLUoxetine (PROZAC) 40 MG capsule Take 1 capsule (40 mg total) by mouth daily. 30 capsule 1   HYDROcodone-acetaminophen (NORCO/VICODIN) 5-325 MG tablet Take 1 tablet by mouth every 6 (six) hours as needed for moderate pain.     hydrOXYzine (ATARAX) 25 MG tablet Take 1 tablet (25 mg total) by mouth every 6 (six) hours as needed for anxiety. (Patient taking differently: Take 25 mg by mouth See admin instructions. Take 25 mg by mouth in the evening as needed for  anxiety) 30 tablet 2   ibuprofen (ADVIL) 800 MG tablet Take 800 mg by mouth every 8 (eight) hours as needed (for pain).     levothyroxine (SYNTHROID, LEVOTHROID) 137 MCG tablet Take 137 mcg by mouth daily before breakfast.     Magnesium 100 MG CAPS Take 100 mg by mouth every 7 (seven) days.     metoprolol tartrate (LOPRESSOR) 25 MG tablet Take 0.5 tablets (12.5 mg total) by mouth daily at 8 pm. (Patient taking differently: Take 12.5 mg by mouth in the morning and at bedtime.) 30 tablet 3   nitroGLYCERIN (NITROSTAT) 0.4 MG SL tablet Place 0.4 mg under the tongue every 5 (five) minutes  as needed for chest pain.     ondansetron (ZOFRAN) 4 MG tablet Take 1 tablet (4 mg total) by mouth every 8 (eight) hours as needed for nausea or vomiting. 30 tablet 0   QUEtiapine (SEROQUEL) 100 MG tablet Take 1 tablet (100 mg total) by mouth at bedtime. 30 tablet 3   rosuvastatin (CRESTOR) 40 MG tablet Take 1 tablet (40 mg total) by mouth daily. 90 tablet 3   traZODone (DESYREL) 100 MG tablet Take 1 tablet (100 mg total) by mouth at bedtime as needed for sleep. (Patient taking differently: Take 100 mg by mouth at bedtime.) 30 tablet 3   Vitamin D, Ergocalciferol, (DRISDOL) 1.25 MG (50000 UNIT) CAPS capsule Take 50,000 Units by mouth every Wednesday.     senna-docusate (SENOKOT-S) 8.6-50 MG tablet Take 1 tablet by mouth at bedtime. (Patient not taking: Reported on 12/30/2021)      Musculoskeletal: Strength & Muscle Tone: decreased Gait & Station: unsteady Patient leans: Front and N/A   Psychiatric Specialty Exam: Presentation  General Appearance:  Appropriate for Environment  Eye Contact: Fair  Speech: Slurred; Pressured  Speech Volume: Normal  Handedness: Ambidextrous   Mood and Affect  Mood: Anxious  Affect: Restricted   Thought Process  Thought Processes: Disorganized  Descriptions of Associations:Loose  Orientation:Partial (Name and DOB)  Thought Content:Illogical  History of  Schizophrenia/Schizoaffective disorder:No  Duration of Psychotic Symptoms:N/A  Hallucinations:Hallucinations: None  Ideas of Reference:None  Suicidal Thoughts:Suicidal Thoughts: No  Homicidal Thoughts:Homicidal Thoughts: No   Sensorium  Memory: Immediate Poor; Remote Poor  Judgment: Poor  Insight: None   Executive Functions  Concentration: Poor  Attention Span: Poor  Recall: Poor  Fund of Knowledge: Poor  Language: Fair   Psychomotor Activity  Psychomotor Activity: Psychomotor Activity: Restlessness; Tremor   Assets  Assets: Social Support    Sleep  Sleep: Sleep: Fair   Physical Exam: Physical Exam Pulmonary:     Effort: Pulmonary effort is normal.  Musculoskeletal:        General: Tenderness present.  Neurological:     Mental Status: She is alert.  Psychiatric:        Attention and Perception: She is inattentive.        Mood and Affect: Mood is anxious.        Speech: Speech is delayed.        Behavior: Behavior is uncooperative.        Cognition and Memory: Cognition is impaired. Memory is impaired.        Judgment: Judgment is inappropriate.    Review of Systems  Respiratory: Negative.    Psychiatric/Behavioral:  Positive for memory loss.    Blood pressure 137/68, pulse (!) 110, temperature 100.2 F (37.9 C), temperature source Oral, resp. rate 20, height '5\' 3"'$  (1.6 m), weight 87.5 kg, SpO2 93 %. Body mass index is 34.19 kg/m.  Medical Decision Making: Patient  with AMS at this time, she is not able to engage in any meaningful conversation. Reviewed information from chart and I appears patient memory and cognition is declining, she has a diagnosis of pseudo-dementia, patient's mobility is poor and she is not capable of taking care of her ADLS. She is on Prozac and Seroquel for depression and anxiety. Patient is psychiatrically cleared and a consult for TOC has ben placed to assist with help finding an appropriate facility.    Disposition: Patient does not meet criteria for psychiatric inpatient admission.  Michaele Offer, PMHNP 01/09/2022 7:57 PM

## 2022-01-09 NOTE — Progress Notes (Signed)
Late documentation: This RNCM notified Erasmo Downer with [facility] regarding hospital bed. Erasmo Downer will assist patient and family with hospital bed.   Notified EDP of need to sign narrative for hospital bed.  TOC will continue to follow for needs.

## 2022-01-09 NOTE — ED Provider Notes (Signed)
Emergency Medicine Observation Re-evaluation Note  Danielle [name] is a 76 y.o. female, seen on rounds today.  Pt initially presented to the ED for complaints of Psychiatric Evaluation Currently, the patient is calm, improved mentation from previous, not agitated.   Physical Exam  BP 124/70 (BP Location: Right Arm)   Pulse 89   Temp 98.9 F (37.2 C) (Oral)   Resp 18   Ht '5\' 3"'$  (1.6 m)   Wt 87.5 kg   SpO2 91%   BMI 34.19 kg/m  Physical Exam General: NAD Cardiac: RR Lungs: even, unlabored Psych: no agitation  ED Course / MDM  EKG:EKG Interpretation  Date/Time:  Monday December 29 2021 11:06:21 EST Ventricular Rate:  90 PR Interval:  176 QRS Duration: 142 QT Interval:  410 QTC Calculation: 502 R Axis:   41 Text Interpretation: Sinus rhythm Consider right atrial enlargement Right bundle branch block No significant change since last tracing Confirmed by Regan Lemming ([id number]) on 12/29/2021 11:29:14 AM  I have reviewed the labs performed to date as well as medications administered while in observation.  Recent changes in the last 24 hours include none, nursing reports improvement in mentation, slept well last night.  Plan  Current plan is for awaiting placement, continued treatment for UTI.Gareth Morgan, MD 01/09/22 1130

## 2022-01-10 DIAGNOSIS — R4189 Other symptoms and signs involving cognitive functions and awareness: Secondary | ICD-10-CM | POA: Diagnosis not present

## 2022-01-10 LAB — CBC
HCT: 41.7 % (ref 36.0–46.0)
Hemoglobin: 13.9 g/dL (ref 12.0–15.0)
MCH: 29.8 pg (ref 26.0–34.0)
MCHC: 33.3 g/dL (ref 30.0–36.0)
MCV: 89.5 fL (ref 80.0–100.0)
Platelets: 284 10*3/uL (ref 150–400)
RBC: 4.66 MIL/uL (ref 3.87–5.11)
RDW: 13.2 % (ref 11.5–15.5)
WBC: 8.3 10*3/uL (ref 4.0–10.5)
nRBC: 0 % (ref 0.0–0.2)

## 2022-01-10 LAB — COMPREHENSIVE METABOLIC PANEL
ALT: 16 U/L (ref 0–44)
AST: 18 U/L (ref 15–41)
Albumin: 3.4 g/dL — ABNORMAL LOW (ref 3.5–5.0)
Alkaline Phosphatase: 64 U/L (ref 38–126)
Anion gap: 9 (ref 5–15)
BUN: 22 mg/dL (ref 8–23)
CO2: 25 mmol/L (ref 22–32)
Calcium: 9.7 mg/dL (ref 8.9–10.3)
Chloride: 105 mmol/L (ref 98–111)
Creatinine, Ser: 1.06 mg/dL — ABNORMAL HIGH (ref 0.44–1.00)
GFR, Estimated: 55 mL/min — ABNORMAL LOW (ref >60)
Glucose, Bld: 157 mg/dL — ABNORMAL HIGH (ref 70–99)
Potassium: 3.9 mmol/L (ref 3.5–5.1)
Sodium: 139 mmol/L (ref 135–145)
Total Bilirubin: 1 mg/dL (ref 0.3–1.2)
Total Protein: 6.4 g/dL — ABNORMAL LOW (ref 6.5–8.1)

## 2022-01-10 LAB — URINALYSIS, ROUTINE W REFLEX MICROSCOPIC
Bilirubin Urine: NEGATIVE
Glucose, UA: NEGATIVE mg/dL
Ketones, ur: 5 mg/dL — AB
Nitrite: NEGATIVE
Protein, ur: 30 mg/dL — AB
Specific Gravity, Urine: 1.024 (ref 1.005–1.030)
WBC, UA: >50 WBC/hpf — ABNORMAL HIGH (ref 0–5)
pH: 5 (ref 5.0–8.0)

## 2022-01-10 LAB — LACTIC ACID, PLASMA: Lactic Acid, Venous: 1.4 mmol/L (ref 0.5–1.9)

## 2022-01-10 MED ORDER — PIPERACILLIN-TAZOBACTAM 3.375 G IVPB
3.3750 g | Freq: Three times a day (TID) | INTRAVENOUS | Status: DC
  Administered 2022-01-10 – 2022-01-13 (×8): 3.375 g via INTRAVENOUS
  Filled 2022-01-10 (×9): qty 50

## 2022-01-10 MED ORDER — CIPROFLOXACIN IN D5W 400 MG/200ML IV SOLN: 400.0000 mg | Freq: Once | INTRAVENOUS | Status: DC

## 2022-01-10 MED ORDER — ROSUVASTATIN CALCIUM 20 MG PO TABS
40.0000 mg | ORAL_TABLET | Freq: Every day | ORAL | Status: DC
  Administered 2022-01-10 – 2022-01-28 (×19): 40 mg via ORAL
  Filled 2022-01-10 (×19): qty 2

## 2022-01-10 MED ORDER — PIPERACILLIN-TAZOBACTAM 3.375 G IVPB 30 MIN
3.3750 g | Freq: Once | INTRAVENOUS | Status: AC
  Administered 2022-01-10: 3.375 g via INTRAVENOUS
  Filled 2022-01-10 (×2): qty 50

## 2022-01-10 NOTE — ED Notes (Signed)
Lab made aware of urine culture.

## 2022-01-10 NOTE — ED Notes (Signed)
Pt's son at bedside

## 2022-01-10 NOTE — ED Notes (Signed)
Pt's son reports "once we get her a bed and her medications straightened out, then we will take her home."

## 2022-01-10 NOTE — ED Notes (Signed)
Pt was able to take medications crushed and placed in apple sauce.

## 2022-01-10 NOTE — Progress Notes (Signed)
PT very confused and not understanding the concept of cpap, at the moment. I talked with the RN and agreed to keep it off as long as vitals where stable.

## 2022-01-10 NOTE — ED Notes (Signed)
Patient has been sleeping since 2300 with no interruption.

## 2022-01-10 NOTE — ED Provider Notes (Signed)
Emergency Medicine Observation Re-evaluation Note  Danielle [name] is a 76 y.o. female, seen on rounds today.  Pt initially presented to the ED for complaints of Psychiatric Evaluation Currently, the patient is confused.  Physical Exam  BP 128/64 (BP Location: Right Arm)   Pulse 98   Temp 98.3 F (36.8 C) (Axillary)   Resp 19   Ht 1.6 m ('5\' 3"'$ )   Wt 87.5 kg   SpO2 96%   BMI 34.19 kg/m  Physical Exam General: wdwn female nad Cardiac: tachycarda  Lungs: cta Psych: confused  ED Course / MDM  EKG:EKG Interpretation  Date/Time:  Monday December 29 2021 11:06:21 EST Ventricular Rate:  90 PR Interval:  176 QRS Duration: 142 QT Interval:  410 QTC Calculation: 502 R Axis:   41 Text Interpretation: Sinus rhythm Consider right atrial enlargement Right bundle branch block No significant change since last tracing Confirmed by Danielle [name] ([id number]) on 12/29/2021 11:29:14 AM  I have reviewed the labs performed to date as well as medications administered while in observation.  Recent changes in the last 24 hours include None.  Plan  Current plan is for reviewed labs and recent vitals Urinalysis greater than 50 wbc urinalysis grew out multiple species suggest recollection Will check in and out cath urine, labs, lactic Son here. 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  Will send for culture. Plan cipro iv Will consult for admission Discussed with Dr. Marylyn Ishihara Consult to pharmacist as patient with recent tx with cipro, cefuroxime, and reported as penicillin allergic Given patient's recent treatment pharmacist advises Zosyn Zosyn ordered    Danielle Boss, MD 01/10/22 1404

## 2022-01-10 NOTE — Progress Notes (Signed)
Pharmacy Antibiotic Note  Danielle [name] is a 76 y.o. female admitted on 12/29/2021 for psychiatric evaluation.  Pharmacy has been consulted for zosyn dosing for presumed UTI.   Plan: Zosyn 3.375g IV q8h (4 hour infusion). Pharmacy to sign off  Height: '5\' 3"'$  (160 cm) Weight: 87.5 kg (193 lb) IBW/kg (Calculated) : 52.4  Temp (24hrs), Avg:99.6 F (37.6 C), Min:98.3 F (36.8 C), Max:100.2 F (37.9 C)  Recent Labs  Lab 01/08/22 0812 01/10/22 1128  WBC 8.8 8.3  CREATININE 1.02* 1.06*  LATICACIDVEN 1.6 1.4    Estimated Creatinine Clearance: 48.1 mL/min (A) (by C-G formula based on SCr of 1.06 mg/dL (H)).    Allergies  Allergen Reactions   Ceftin [Cefuroxime Axetil] Hives, Swelling and Other (See Comments)    Swelling of face   Codeine Nausea Only   Compazine [Prochlorperazine] Swelling and Other (See Comments)    Face and tongue swelling    12/28 cipro >> 12/30 12/30 zosyn>>   12/18 UCx: <10k colonies,insig growth F 12/28 UCx: mult speciesF 12/30 I/O UCx(after 6 doses cipro): sent  Thank you for allowing pharmacy to be a part of this patient's care.  Eudelia Bunch, Pharm.D Use secure chat for questions 01/10/2022 2:21 PM

## 2022-01-10 NOTE — H&P (Signed)
History and Physical    Patient: Danielle [name] PNT:[id number] DOB: 09/02/1945 DOA: 12/29/2021 DOS: the patient was seen and examined on 01/10/2022 PCP: [name], MD  Patient coming from: Home  Chief Complaint:  Chief Complaint  Patient presents with   Psychiatric Evaluation   HPI: Danielle [name] is a 76 y.o. female with medical history significant of CAD, pseudo-dementia, depression/anxiety, DM2, HLD, HTN, hypothyroidism, GERD. Presenting with fevers. She has been in the ED Pysc hold for 12 day d/t altered mental status w/ visual/auditory hallucinations. She was evaluated by psyc and found to not qualify for inpatient admission. TOC was consulted for facility placement. During her stay, she was treated for a UTI w/ omnicef. However over the past couple of days, she's had several fevers. A repeat UA w/ I&O cath was concerning for UTI. Pharmacy was consulted and recommended zosyn for treatment. TRH was called for admission. She is currently confused and tearful. She is unable to provide additional pertinent information.   Review of Systems: unable to review all systems due to the inability of the patient to answer questions. Past Medical History:  Diagnosis Date   Arthritis 02/13/2011   osteoarthritis, hips, knees,s/p Cervical fusion(DDD)   Atherosclerosis of aorta ([place]) 05/30/2015   on CTA chest   CAD (coronary artery disease)    a. 09/2016: Coronary CT showing mid-LAD plaque with 20-50% associated stenosis but no significant stenosis by FFR analysis.    Dementia ([place])    Depression with anxiety    Diabetes mellitus without complication ([place])    Difficult intubation 02/13/2011   with surgery -multiple times, no problems with recent surgeries   GERD (gastroesophageal reflux disease) 02/13/2011   tx. Omeprazole   Glaucoma 02/13/2011   bil. tx. eye drops daily   Headache(784.0) 02/13/2011   past hx. migraines, none recent   Heart murmur 02/13/2011   was told-no  issues with this   Hypercholesterolemia    Hypertension 02/13/2011   Hypothyroidism 02/13/2011   tx. with Levothyroxine   Major depressive disorder    Mitral regurgitation 06/01/2015   Mild   Sinus drainage 02/13/2011   uses Zyrtec as needed   Sleep apnea 02/13/2011   Hx. sleep apnea-uses cpap since 10'12 nightly   Past Surgical History:  Procedure Laterality Date   ABDOMINAL HYSTERECTOMY  02/13/2011   APPENDECTOMY  02/13/2011   Lap. removal '92   BREAST CYST EXCISION Left    CERVICAL FUSION  02/13/2011   '92- retained hardware   CHOLECYSTECTOMY  02/13/2011   '98-lap. galbladder removal due to stones   GUM SURGERY  02/13/2011   "small mouth"   JOINT REPLACEMENT  02/13/2011   RTHA-a yr ago in [place] Left 08/15/2019   Procedure: EXCISION OF SEBACEOUS CYST LEFT BREAST;  Surgeon: Georganna Skeans, MD;  Location: [facility];  Service: General;  Laterality: Left;   TONSILLECTOMY  02/13/2011   child   TOTAL HIP ARTHROPLASTY  02/17/2011   Procedure: TOTAL HIP ARTHROPLASTY ANTERIOR APPROACH;  Surgeon: Mauri Pole, MD;  Location: [facility];  Service: Orthopedics;  Laterality: Left;   TUBAL LIGATION  02/13/2011   Social History:  reports that she has never smoked. She has never used smokeless tobacco. She reports current alcohol use. She reports that she does not use drugs.  Allergies  Allergen Reactions   Ceftin [Cefuroxime Axetil] Hives, Swelling and Other (See Comments)    Swelling of face   Codeine Nausea Only   Compazine [  Prochlorperazine] Swelling and Other (See Comments)    Face and tongue swelling    Family History  Problem Relation Age of Onset   Cancer Mother        lung   Breast cancer Maternal Aunt    Alzheimer's disease Neg Hx    Dementia Neg Hx     Prior to Admission medications   Medication Sig Start Date End Date Taking? Authorizing Provider  bisacodyl (DULCOLAX) 5 MG EC tablet Take 5 mg by mouth daily as needed for mild  constipation or moderate constipation.   Yes [provider]  CALCIUM PO Take 1 tablet by mouth every 7 (seven) days.   Yes [provider]  cariprazine (VRAYLAR) 1.5 MG capsule Take 1 capsule (1.5 mg total) by mouth daily. 12/25/21  Yes White, Aaron Edelman A, NP  FLUoxetine (PROZAC) 40 MG capsule Take 1 capsule (40 mg total) by mouth daily. 12/25/21  Yes White, Louanna Raw, NP  HYDROcodone-acetaminophen (NORCO/VICODIN) 5-325 MG tablet Take 1 tablet by mouth every 6 (six) hours as needed for moderate pain.   Yes [provider]  hydrOXYzine (ATARAX) 25 MG tablet Take 1 tablet (25 mg total) by mouth every 6 (six) hours as needed for anxiety. Patient taking differently: Take 25 mg by mouth See admin instructions. Take 25 mg by mouth in the evening as needed for anxiety 12/25/21  Yes White, Aaron Edelman A, NP  ibuprofen (ADVIL) 800 MG tablet Take 800 mg by mouth every 8 (eight) hours as needed (for pain).   Yes [provider]  levothyroxine (SYNTHROID, LEVOTHROID) 137 MCG tablet Take 137 mcg by mouth daily before breakfast.   Yes [provider]  Magnesium 100 MG CAPS Take 100 mg by mouth every 7 (seven) days.   Yes [provider]  metoprolol tartrate (LOPRESSOR) 25 MG tablet Take 0.5 tablets (12.5 mg total) by mouth daily at 8 pm. Patient taking differently: Take 12.5 mg by mouth in the morning and at bedtime. 12/03/21  Yes Parks Ranger, DO  nitroGLYCERIN (NITROSTAT) 0.4 MG SL tablet Place 0.4 mg under the tongue every 5 (five) minutes as needed for chest pain.   Yes [provider]  ondansetron (ZOFRAN) 4 MG tablet Take 1 tablet (4 mg total) by mouth every 8 (eight) hours as needed for nausea or vomiting. 12/10/21  Yes White, Aaron Edelman A, NP  QUEtiapine (SEROQUEL) 100 MG tablet Take 1 tablet (100 mg total) by mouth at bedtime. 12/25/21  Yes Lesle Chris A, NP  rosuvastatin (CRESTOR) 40 MG tablet Take 1 tablet (40 mg total) by mouth daily. 08/18/17   Yes Skeet Latch, MD  traZODone (DESYREL) 100 MG tablet Take 1 tablet (100 mg total) by mouth at bedtime as needed for sleep. Patient taking differently: Take 100 mg by mouth at bedtime. 12/03/21  Yes Parks Ranger, DO  Vitamin D, Ergocalciferol, (DRISDOL) 1.25 MG (50000 UNIT) CAPS capsule Take 50,000 Units by mouth every Wednesday. 07/01/21  Yes [provider]  senna-docusate (SENOKOT-S) 8.6-50 MG tablet Take 1 tablet by mouth at bedtime. Patient not taking: Reported on 12/30/2021 11/26/21   Kerney Elbe, DO    Physical Exam: Vitals:   01/09/22 0645 01/09/22 1834 01/10/22 0359 01/10/22 1105  BP: 124/70 137/68 128/64   Pulse: 89 (!) 110 98   Resp: '18 20 19   '$ Temp: 98.9 F (37.2 C) 100.2 F (37.9 C) 98.3 F (36.8 C) 100.2 F (37.9 C)  TempSrc: Oral Oral Axillary Rectal  SpO2:  91% 93% 96%   Weight:      Height:       General: 76 y.o. female resting in bed in NAD Eyes: PERRL, normal sclera ENMT: Nares patent w/o discharge, orophaynx clear, dentition normal, ears w/o discharge/lesions/ulcers Neck: Supple, trachea midline Cardiovascular: RRR, +S1, S2, no m/g/r, equal pulses throughout Respiratory: CTABL, no w/r/r, normal WOB GI: BS+, NDNT, no masses noted, no organomegaly noted MSK: No e/c/c Neuro: A&O x 2 (name, place), no focal deficits Psyc: Appropriate interaction and affect, calm/cooperative  Data Reviewed:  Results for orders placed or performed during the hospital encounter of 12/29/21 (from the past 24 hour(s))  Urinalysis, Routine w reflex microscopic Urine, In & Out Cath     Status: Abnormal   Collection Time: 01/10/22 11:16 AM  Result Value Ref Range   Color, Urine AMBER (A) YELLOW   APPearance CLOUDY (A) CLEAR   Specific Gravity, Urine 1.024 1.005 - 1.030   pH 5.0 5.0 - 8.0   Glucose, UA NEGATIVE NEGATIVE mg/dL   Hgb urine dipstick LARGE (A) NEGATIVE   Bilirubin Urine NEGATIVE NEGATIVE   Ketones, ur 5 (A) NEGATIVE mg/dL   Protein,  ur 30 (A) NEGATIVE mg/dL   Nitrite NEGATIVE NEGATIVE   Leukocytes,Ua LARGE (A) NEGATIVE   RBC / HPF 11-20 0 - 5 RBC/hpf   WBC, UA >50 (H) 0 - 5 WBC/hpf   Bacteria, UA FEW (A) NONE SEEN   Squamous Epithelial / LPF 0-5 0 - 5 /HPF   Mucus PRESENT   Lactic acid, plasma     Status: None   Collection Time: 01/10/22 11:28 AM  Result Value Ref Range   Lactic Acid, Venous 1.4 0.5 - 1.9 mmol/L  CBC     Status: None   Collection Time: 01/10/22 11:28 AM  Result Value Ref Range   WBC 8.3 4.0 - 10.5 K/uL   RBC 4.66 3.87 - 5.11 MIL/uL   Hemoglobin 13.9 12.0 - 15.0 g/dL   HCT 41.7 36.0 - 46.0 %   MCV 89.5 80.0 - 100.0 fL   MCH 29.8 26.0 - 34.0 pg   MCHC 33.3 30.0 - 36.0 g/dL   RDW 13.2 11.5 - 15.5 %   Platelets 284 150 - 400 K/uL   nRBC 0.0 0.0 - 0.2 %  Comprehensive metabolic panel     Status: Abnormal   Collection Time: 01/10/22 11:28 AM  Result Value Ref Range   Sodium 139 135 - 145 mmol/L   Potassium 3.9 3.5 - 5.1 mmol/L   Chloride 105 98 - 111 mmol/L   CO2 25 22 - 32 mmol/L   Glucose, Bld 157 (H) 70 - 99 mg/dL   BUN 22 8 - 23 mg/dL   Creatinine, Ser 1.06 (H) 0.44 - 1.00 mg/dL   Calcium 9.7 8.9 - 10.3 mg/dL   Total Protein 6.4 (L) 6.5 - 8.1 g/dL   Albumin 3.4 (L) 3.5 - 5.0 g/dL   AST 18 15 - 41 U/L   ALT 16 0 - 44 U/L   Alkaline Phosphatase 64 38 - 126 U/L   Total Bilirubin 1.0 0.3 - 1.2 mg/dL   GFR, Estimated 55 (L) >60 mL/min   Anion gap 9 5 - 15   Assessment and Plan: UTI     - placed in obs, med-surg     - continue zosyn     - follow UCx     - PRN APAP  Pseudo-dementia Anxiety/Depression AMS w/ hallucinations     - evaled  by TTS; not meeting for inpt     - need long-term placement     - TOC onboard     - continue seroquel, prozac     - continue sitter     - delirium precautions  HTN     - continue metoprolol  CAD HLD     - continue home regimen  DM2     - last A1c is 5.7,     - daily glucose check, DM diet  Hypothyroidism     - continue  synthroid  OSA     - qHS CPAP  Advance Care Planning:   Code Status: Full Code  Consults: None  Family Communication: None at bedside  Severity of Illness: The appropriate patient status for this patient is OBSERVATION. Observation status is judged to be reasonable and necessary in order to provide the required intensity of service to ensure the patient's safety. The patient's presenting symptoms, physical exam findings, and initial radiographic and laboratory data in the context of their medical condition is felt to place them at decreased risk for further clinical deterioration. Furthermore, it is anticipated that the patient will be medically stable for discharge from the hospital within 2 midnights of admission.   Author: Jonnie Finner, DO 01/10/2022 1:57 PM  For on call review [email or web address].

## 2022-01-11 DIAGNOSIS — N390 Urinary tract infection, site not specified: Secondary | ICD-10-CM

## 2022-01-11 DIAGNOSIS — R4189 Other symptoms and signs involving cognitive functions and awareness: Secondary | ICD-10-CM | POA: Diagnosis not present

## 2022-01-11 DIAGNOSIS — F418 Other specified anxiety disorders: Secondary | ICD-10-CM

## 2022-01-11 LAB — URINE CULTURE: Culture: NO GROWTH

## 2022-01-11 LAB — COMPREHENSIVE METABOLIC PANEL
ALT: 16 U/L (ref 0–44)
AST: 22 U/L (ref 15–41)
Albumin: 3.5 g/dL (ref 3.5–5.0)
Alkaline Phosphatase: 62 U/L (ref 38–126)
Anion gap: 10 (ref 5–15)
BUN: 21 mg/dL (ref 8–23)
CO2: 23 mmol/L (ref 22–32)
Calcium: 9.5 mg/dL (ref 8.9–10.3)
Chloride: 103 mmol/L (ref 98–111)
Creatinine, Ser: 1.23 mg/dL — ABNORMAL HIGH (ref 0.44–1.00)
GFR, Estimated: 46 mL/min — ABNORMAL LOW (ref >60)
Glucose, Bld: 174 mg/dL — ABNORMAL HIGH (ref 70–99)
Potassium: 3.8 mmol/L (ref 3.5–5.1)
Sodium: 136 mmol/L (ref 135–145)
Total Bilirubin: 1.1 mg/dL (ref 0.3–1.2)
Total Protein: 6.4 g/dL — ABNORMAL LOW (ref 6.5–8.1)

## 2022-01-11 LAB — CBC
HCT: 42.1 % (ref 36.0–46.0)
Hemoglobin: 13.7 g/dL (ref 12.0–15.0)
MCH: 29.8 pg (ref 26.0–34.0)
MCHC: 32.5 g/dL (ref 30.0–36.0)
MCV: 91.7 fL (ref 80.0–100.0)
Platelets: 278 10*3/uL (ref 150–400)
RBC: 4.59 MIL/uL (ref 3.87–5.11)
RDW: 13.2 % (ref 11.5–15.5)
WBC: 8.2 10*3/uL (ref 4.0–10.5)
nRBC: 0 % (ref 0.0–0.2)

## 2022-01-11 MED ORDER — TAMSULOSIN HCL 0.4 MG PO CAPS
0.4000 mg | ORAL_CAPSULE | Freq: Every day | ORAL | Status: DC
  Administered 2022-01-11 – 2022-01-17 (×7): 0.4 mg via ORAL
  Filled 2022-01-11 (×7): qty 1

## 2022-01-11 NOTE — Progress Notes (Signed)
PROGRESS NOTE    Danielle [name]  EXH:[id number] DOB: Oct 06, 1945 DOA: 12/29/2021 PCP: [name], MD   Brief Narrative:   76 y.o. female with medical history significant of CAD, pseudo-dementia, depression/anxiety, DM2, HLD, HTN, hypothyroidism, GERD who was in the ED psych hold for 12-days due to altered mental status with visual/auditory hallucinations and was evaluated by psych and found to not qualify for inpatient admission and TOC was working on facility placement.  She was having intermittent fevers in the ED not improving with oral Cipro which was started for possible UTI.  She was subsequently admitted and started on IV Zosyn.  Assessment & Plan:   UTI: Present on admission -Follow cultures.  Currently on Zosyn.  Pseudodementia Anxiety with depression Altered mental status with hallucinations -TTS had seen the patient in the ED and determined that patient does not need inpatient psychiatric hospitalization -Consult psychiatry: Psych meds as per psychiatry recommendations -Fall precautions.  Delirium precautions.  Hypertension -Monitor blood pressure.  Continue metoprolol  Hypothyroidism -Continue levothyroxine  Hyperlipidemia -Continue statin  Diabetes mellitus type 2 -Last A1c 5.7.  Blood sugars currently stable.  Carb modified diet  OSA -Continue CPAP nightly  Chronically disease stage IIIa -Creatinine currently stable.  Monitor intermittently.  Goals of care -Consult palliative care for goals of care discussion  Physical deconditioning -PT eval    DVT prophylaxis: Start Lovenox Code Status: Full Family Communication: None at bedside Disposition Plan: Status is: Observation The patient will require care spanning > 2 midnights and should be moved to inpatient because: Of need for IV antibiotics    Consultants: Consult psychiatry and palliative care  Procedures: None  Antimicrobials:  Anti-infectives (From admission, onward)     Start     Dose/Rate Route Frequency Ordered Stop   01/10/22 2000  piperacillin-tazobactam (ZOSYN) IVPB 3.375 g        3.375 g 12.5 mL/hr over 240 Minutes Intravenous Every 8 hours 01/10/22 1405     01/10/22 1415  piperacillin-tazobactam (ZOSYN) IVPB 3.375 g        3.375 g 100 mL/hr over 30 Minutes Intravenous  Once 01/10/22 1408 01/10/22 1538   01/10/22 1330  ciprofloxacin (CIPRO) IVPB 400 mg  Status:  Discontinued        400 mg 200 mL/hr over 60 Minutes Intravenous  Once 01/10/22 1327 01/10/22 1342   01/08/22 0330  ciprofloxacin (CIPRO) tablet 500 mg  Status:  Discontinued        500 mg Oral 2 times daily 01/08/22 3810 01/10/22 1405        Subjective: Patient seen and examined at bedside.  Awake but a poor historian, talking in sentences which do not make sense.  Extremely fidgety.  Pulled out her IV line.  No fever, vomiting reported.  Objective: Vitals:   01/10/22 1934 01/10/22 2140 01/11/22 0126 01/11/22 0327  BP: (!) 135/96 (!) 145/75 116/74 120/63  Pulse: 100 80 89 85  Resp: '17  17 17  '$ Temp: 98.9 F (37.2 C) 98.3 F (36.8 C) 98.7 F (37.1 C) 98.7 F (37.1 C)  TempSrc: Oral Oral Oral Oral  SpO2: 94% 92% 95% 94%  Weight:      Height:        Intake/Output Summary (Last 24 hours) at 01/11/2022 1159 Last data filed at 01/11/2022 0900 Gross per 24 hour  Intake 430.07 ml  Output --  Net 430.07 ml   Filed Weights   12/29/21 0908  Weight: 87.5 kg    Examination:  General  exam: Appears calm and comfortable.  Looks chronically ill and deconditioned.  Currently on room air. Respiratory system: Bilateral decreased breath sounds at bases Cardiovascular system: S1 & S2 heard, Rate controlled Gastrointestinal system: Abdomen is nondistended, soft and nontender. Normal bowel sounds heard. Extremities: No cyanosis, clubbing, edema  Central nervous system: Awake, to respond, poor historian.  Extremely fidgety.  No focal neurological deficits. Moving extremities Skin: No  rashes, lesions or ulcers Psychiatry: Affect is mostly flat.  Talking in sentences which do not make sense.  Data Reviewed: I have personally reviewed following labs and imaging studies  CBC: Recent Labs  Lab 01/08/22 0812 01/10/22 1128 01/11/22 0805  WBC 8.8 8.3 8.2  NEUTROABS 5.8  --   --   HGB 14.7 13.9 13.7  HCT 44.5 41.7 42.1  MCV 90.1 89.5 91.7  PLT 295 284 211   Basic Metabolic Panel: Recent Labs  Lab 01/08/22 0812 01/10/22 1128 01/11/22 0805  NA 138 139 136  K 3.6 3.9 3.8  CL 105 105 103  CO2 '23 25 23  '$ GLUCOSE 108* 157* 174*  BUN '14 22 21  '$ CREATININE 1.02* 1.06* 1.23*  CALCIUM 9.7 9.7 9.5   GFR: Estimated Creatinine Clearance: 41.4 mL/min (A) (by C-G formula based on SCr of 1.23 mg/dL (H)). Liver Function Tests: Recent Labs  Lab 01/10/22 1128 01/11/22 0805  AST 18 22  ALT 16 16  ALKPHOS 64 62  BILITOT 1.0 1.1  PROT 6.4* 6.4*  ALBUMIN 3.4* 3.5   No results for input(s): "LIPASE", "AMYLASE" in the last 168 hours. No results for input(s): "AMMONIA" in the last 168 hours. Coagulation Profile: No results for input(s): "INR", "PROTIME" in the last 168 hours. Cardiac Enzymes: No results for input(s): "CKTOTAL", "CKMB", "CKMBINDEX", "TROPONINI" in the last 168 hours. BNP (last 3 results) No results for input(s): "PROBNP" in the last 8760 hours. HbA1C: No results for input(s): "HGBA1C" in the last 72 hours. CBG: No results for input(s): "GLUCAP" in the last 168 hours. Lipid Profile: No results for input(s): "CHOL", "HDL", "LDLCALC", "TRIG", "CHOLHDL", "LDLDIRECT" in the last 72 hours. Thyroid Function Tests: No results for input(s): "TSH", "T4TOTAL", "FREET4", "T3FREE", "THYROIDAB" in the last 72 hours. Anemia Panel: No results for input(s): "VITAMINB12", "FOLATE", "FERRITIN", "TIBC", "IRON", "RETICCTPCT" in the last 72 hours. Sepsis Labs: Recent Labs  Lab 01/08/22 9417 01/10/22 1128  LATICACIDVEN 1.6 1.4    Recent Results (from the past 240  hour(s))  Urine Culture     Status: Abnormal   Collection Time: 01/08/22  2:40 AM   Specimen: Urine, Clean Catch  Result Value Ref Range Status   Specimen Description   Final    URINE, CLEAN CATCH Performed at [facility], [place] [address] [place], [place] [address]    Special Requests   Final    NONE Performed at [facility], [place] [address], [place], [place] [address]    Culture MULTIPLE SPECIES PRESENT, SUGGEST RECOLLECTION (A)  Final   Report Status 01/09/2022 FINAL  Final         Radiology Studies: No results found.      Scheduled Meds:  levothyroxine  137 mcg Oral Q0600   metoprolol tartrate  12.5 mg Oral Q2000   QUEtiapine  100 mg Oral QHS   QUEtiapine  25 mg Oral BID   rosuvastatin  40 mg Oral Daily   senna-docusate  1 tablet Oral QHS   Continuous Infusions:  piperacillin-tazobactam (ZOSYN)  IV 3.375 g (01/11/22 0323)  Aline August, MD [facility] 01/11/2022, 11:59 AM

## 2022-01-11 NOTE — TOC Progression Note (Addendum)
Transition of Care [facility]) - Progression Note    Patient Details  Name: Danielle [name] MRN: [id number] Date of Birth: Feb 10, 1945  [facility] CM/SW Contact  Lenard Kampf, Juliann Pulse, RN Phone Number: 01/11/2022, 3:52 PM  Clinical Narrative: Noted received a [employer] appeal decision-According to [facility], [place] physician review-ending services is not appropriate based on the findings noted.Noted there is no d/c order, & observation status.MD notified.Placed in shadow chart. -4p-spouse was inappropriate in comments-yelling,stating-she has been in the ED & we have done nothing,he can't take care of her. I informed him of inappropriate appeal since only observation status, & no d/c order-he hung phone up.MD notified.  -4:17p-patient has already been faxed out no bed offers only 1 facility considering.TOC to f/u in am.      Barriers to Discharge: Continued Medical Work up  Expected Discharge Plan and Services In-house Referral: NA Discharge Planning Services: CM Consult Post Acute Care Choice: [place] arrangements for the past 2 months: Single Family Home                   DME Agency: NA       HH Arranged: NA HH Agency: NA         Social Determinants of Health (SDOH) Interventions [place]: No Food Insecurity (11/26/2021)  Housing: Low Risk  (11/26/2021)  Transportation Needs: No Transportation Needs (11/26/2021)  Utilities: Not At Risk (11/26/2021)  Alcohol Screen: Low Risk  (11/26/2021)  Depression (PHQ2-9): High Risk (10/09/2021)  Tobacco Use: Low Risk  (12/29/2021)    Readmission Risk Interventions    10/31/2021    5:06 PM  Readmission Risk Prevention Plan  Transportation Screening Complete  PCP or Specialist Appt within 3-5 Days Complete  HRI or [place] Complete  Social Work Consult for [place] Planning/Counseling Complete  Palliative Care Screening Not Applicable  Medication Review [employer])  Complete

## 2022-01-11 NOTE — Consult Note (Signed)
[facility] New Face-to-Face Psychiatric Evaluation   Service Date: January 11, 2022 LOS:  LOS: 0 days    Assessment  Danielle [name] is a 76 y.o. female admitted medically for 12/29/2021  8:50 AM for altered mental status with audiovisual hallucinations. She carries the psychiatric diagnoses of pseudodementia, depression and anxiety and has a past medical history of CAD, GERD, hypertension, hyperlipidemia, type 2 diabetes, sleep apnea. Psychiatry was consulted for altered mental status by Dr. Aline August.   At this time, the patient's presentation is most consistent with hyperactive delirium, most likely due to multiple etiologies including but not limited to infection, medications, pain, altered sleep/wake cycle, and limited mobility. The patient would strongly benefit from medical treatment of possible UTI.  Most likely what occurred is patient developed a UTI in the outpatient setting which led to worsening of behaviors and hallucinations in a short period of time.  Her initial urine culture on 01/08/2022 had multiple species present and so recollection was obtained on 01/10/2022 and is still pending.  Notably she was just hospitalized in November 2023 after a series of falls at home and developed a delirium during that hospitalization as well.  Recommendation at that time had been for SNF per PT and OT.  During this time period, minimization of delirogenic insults will be of utmost importance; this includes promoting the normal circadian cycle, minimizing lines/tubes, avoiding deliriogenic medications such as benzodiazepines and anticholinergic medications, and frequently reorienting the patient. Symptomatic treatment for agitation can be provided by antipsychotic medications, though it is important to remember that these do not treat the underlying etiology of delirium. Notably, there can be a time lag effect between treatment of a medical problem and resolution of delirium.  This time lag effect may be of longer duration in the elderly, and those with underlying cognitive impairment or brain injury.   She appears to be more or less compliant with oral medications and has ripped out her IV previously so would recommend switching her antibiotic to an oral option to avoid IV lines that will most likely just get ripped out.  This should also help [place] to avoid having to place patient in restraints.  Current outpatient psychotropic medications include Prozac, hydroxyzine, Seroquel, trazodone and historically she has had a decent response to these medications. She was compliant with medications prior to admission as evidenced by refill history and collateral. On initial examination, patient unable to provide a meaningful information and unable to follow instructions she is consistent with prior psychiatric interview and this hospital course. For [place], have discontinued her hydroxyzine, Prozac as these can be deliriogenic or perpetuating a delirium when present.  Once her delirium clears could reconsider adding back on Prozac but will hold off on this for now.  Would recommend staying off of hydroxyzine in the future.  Discontinued her Geodon to avoid different antipsychotics being given concurrently.  Do question her pseudodementia diagnosis and it is more likely that she has a neurocognitive disorder more consistent with a full dementia diagnosis and prior head imaging from November 2023 could indicate vascular dementia.  Pointing away from a pseudodementia diagnosis is that her mood symptoms appeared adequately treated during her last admission to the geriatric unit of [facility] and she was still having memory difficulties.  Note from prior admission stated that she had neurology evaluation on October 16, 2021 and they felt her memory problems were not consistent with neurodegenerative disease but I am unable to see these  notes.  We will not be able to assess her  dementia due to ongoing delirium and this would not provide accurate results at this time.  Will defer dementia testing to her outpatient provider, Lesle Chris at [facility], as well as restarting medications that were discontinued in the setting of her delirium.  With family's difficulty in taking care of her at home do agree with option of SNF though this appears to be financially not doable for patient and this may be due in part to pseudodementia diagnosis which again may not be fully accurate.  Please see plan below for detailed recommendations.   Diagnoses:  Active Hospital problems: Principal Problem:   Pseudodementia Active Problems:   OSA (obstructive sleep apnea)   Hypertension   Hyperlipidemia LDL goal <70   Hypothyroidism   Depression with anxiety   UTI (urinary tract infection)   Altered mental status, unspecified     Plan  ## Safety and Observation Level:  - Based on my clinical evaluation, I estimate the patient to be at elevated risk of self harm in the current setting due to delirium as evidenced by ripping out of recent IV and disorientation.  She is also a fall risk and has fallen several times in the last few months. - At this time, we recommend a every 15 minute level of observation and a bed alarm. This decision is based on my review of the chart including patient's history and current presentation, interview of the patient, mental status examination, and consideration of suicide risk including evaluating suicidal ideation, plan, intent, suicidal or self-harm behaviors, risk factors, and protective factors. This judgment is based on our ability to directly address suicide risk, implement suicide prevention strategies and develop a safety plan while the patient is in the clinical setting. Please contact our team if there is a concern that risk level has changed.   ## Medications:  -- Discontinued Prozac 40 mg once daily due to delirium --Discontinued hydroxyzine 25 mg  every 6 hours as needed anxiety due to delirium --Discontinue Geodon IM to reduce antipsychotic burden for likely dementia --Continue Seroquel 100 mg nightly per chronic home medication --Continue Seroquel 25 mg twice daily per chronic home medication continue trazodone 100 mg nightly as needed for sleep --Recommend switching IV antibiotic to oral option as she has been mostly compliant with oral medications and this will prevent IV getting ripped out again  ## Medical Decision Making Capacity:  Not formally assessed today  ## Further Work-up:  -- None indicated at this time, likely UTI as source as delirium -- most recent EKG on 01/02/2022 had QtC of 502 ms but with noted possible right bundle branch block and right atrial enlargement recommend getting repeat ECG -- Pertinent labwork reviewed earlier this admission includes: CBC within normal limits, CMP with notable elevated glucose BUN to 1.06 low albumin, GFR of 55, lactic acid normal urinalysis notable for amber color cloudy appearance large amount of hemoglobin 5 ketones 30 protein and large leukocytes greater than 50 white blood cells no squamous cells few bacteria and urine culture pending taken on 01/10/2022, free T4 was elevated to 1.26 with a normal TSH, normal ethanol and negative urine drug screen.  Previous CT of her head showed possible ischemic small vessel disease.   ## Disposition:  -- Once patient is medically cleared, from a psychiatric standpoint she can return home (does not need psychiatric admission) --There has been discussion about moving into assisted living facility versus home will defer to  primary team/PT/OT about this  ## Behavioral / Environmental:  -- Continue delirium precautions as already ordered --Would add bed alarm due to fall risk  ##Legal Status Voluntary at this time  Thank you for this consult request. Recommendations have been communicated to the primary team.  We will sign off at this time.    Jacquelynn Cree, MD   NEW history  Relevant Aspects of Hospital Course:  Admitted on 12/29/2021 for altered mental status.  She had the board in the emergency department for 12 days due to limited bed placement.  During that time she had the lab results as previously noted in this note and urine culture that was obtained is still pending.  Pharmacist recommended Zosyn but patient pulled out that IV when she got to the floor.  Does not appear that she required as needed Haldol or Ativan during her time in the emergency department.  But did require IM Geodon 10 mg once on 01/08/2022.  Patient Report:  Patient minimally able to interact on interview today.  Thought that she knew this Probation officer (we have never met before) and that she likely had met me yesterday.  Most of her responses were nonsensical in nature with unknown word usage.  She was able to say that her name was [place] but did not provide the correct last name and also was unable to tell me who her husband was.  She was able to say that her son's name is Doctor, general practice.  Says that she lives in [place] but disoriented to present and unable to choose from multiple choice options, also disoriented to place, and date.  She was unable to follow directions for doing savahaart or do any spelling tests.  At 1 point began incorporating what was happening on the television screen into her answers.  When asked if she was in any pain was unable to describe the body parts that were in pain but when given multiple choice was able to indicate that her back and hip were not pain.  ROS:  Unable to complete due to patient delirium  Collateral information:  None at this time  Psychiatric History:  Please see prior H&P note from November for this information.   Social History:  See above regarding psychiatric history  Family History:  See above regarding psychiatric history The patient's family history includes Breast cancer in her maternal aunt; Cancer in  her mother.  Medical History: Past Medical History:  Diagnosis Date   Arthritis 02/13/2011   osteoarthritis, hips, knees,s/p Cervical fusion(DDD)   Atherosclerosis of aorta ([place]) 05/30/2015   on CTA chest   CAD (coronary artery disease)    a. 09/2016: Coronary CT showing mid-LAD plaque with 20-50% associated stenosis but no significant stenosis by FFR analysis.    Dementia ([place])    Depression with anxiety    Diabetes mellitus without complication ([place])    Difficult intubation 02/13/2011   with surgery -multiple times, no problems with recent surgeries   GERD (gastroesophageal reflux disease) 02/13/2011   tx. Omeprazole   Glaucoma 02/13/2011   bil. tx. eye drops daily   Headache(784.0) 02/13/2011   past hx. migraines, none recent   Heart murmur 02/13/2011   was told-no issues with this   Hypercholesterolemia    Hypertension 02/13/2011   Hypothyroidism 02/13/2011   tx. with Levothyroxine   Major depressive disorder    Mitral regurgitation 06/01/2015   Mild   Sinus drainage 02/13/2011   uses Zyrtec as needed   Sleep apnea  02/13/2011   Hx. sleep apnea-uses cpap since 10'12 nightly    Surgical History: Past Surgical History:  Procedure Laterality Date   ABDOMINAL HYSTERECTOMY  02/13/2011   APPENDECTOMY  02/13/2011   Lap. removal '92   BREAST CYST EXCISION Left    CERVICAL FUSION  02/13/2011   '92- retained hardware   CHOLECYSTECTOMY  02/13/2011   '98-lap. galbladder removal due to stones   GUM SURGERY  02/13/2011   "small mouth"   JOINT REPLACEMENT  02/13/2011   RTHA-a yr ago in [place] Left 08/15/2019   Procedure: EXCISION OF SEBACEOUS CYST LEFT BREAST;  Surgeon: Georganna Skeans, MD;  Location: [facility];  Service: General;  Laterality: Left;   TONSILLECTOMY  02/13/2011   child   TOTAL HIP ARTHROPLASTY  02/17/2011   Procedure: TOTAL HIP ARTHROPLASTY ANTERIOR APPROACH;  Surgeon: Mauri Pole, MD;  Location: [facility];  Service:  Orthopedics;  Laterality: Left;   TUBAL LIGATION  02/13/2011    Medications:   Current Facility-Administered Medications:    acetaminophen (TYLENOL) tablet 650 mg, 650 mg, Oral, Q6H PRN, Marylyn Ishihara, Tyrone A, DO, 650 mg at 01/09/22 1848   diclofenac Sodium (VOLTAREN) 1 % topical gel 2 g, 2 g, Topical, BID PRN, Marylyn Ishihara, Tyrone A, DO, 2 g at 01/07/22 2220   FLUoxetine (PROZAC) capsule 40 mg, 40 mg, Oral, Daily, Kyle, Tyrone A, DO, 40 mg at 01/11/22 1000   hydrOXYzine (ATARAX) tablet 25 mg, 25 mg, Oral, Q6H PRN, Marylyn Ishihara, Tyrone A, DO, 25 mg at 01/09/22 2146   levothyroxine (SYNTHROID) tablet 137 mcg, 137 mcg, Oral, Q0600, Marylyn Ishihara, Tyrone A, DO, 137 mcg at 01/11/22 0615   metoprolol tartrate (LOPRESSOR) tablet 12.5 mg, 12.5 mg, Oral, Q2000, Kyle, Tyrone A, DO, 12.5 mg at 01/10/22 1937   piperacillin-tazobactam (ZOSYN) IVPB 3.375 g, 3.375 g, Intravenous, Q8H, Kyle, Tyrone A, DO, Last Rate: 12.5 mL/hr at 01/11/22 0323, 3.375 g at 01/11/22 5701   QUEtiapine (SEROQUEL) tablet 100 mg, 100 mg, Oral, QHS, Kyle, Tyrone A, DO, 100 mg at 01/10/22 2100   QUEtiapine (SEROQUEL) tablet 25 mg, 25 mg, Oral, BID, Kyle, Tyrone A, DO, 25 mg at 01/11/22 1000   rosuvastatin (CRESTOR) tablet 40 mg, 40 mg, Oral, Daily, Kyle, Tyrone A, DO, 40 mg at 01/11/22 1000   senna-docusate (Senokot-S) tablet 1 tablet, 1 tablet, Oral, QHS, Kyle, Tyrone A, DO, 1 tablet at 01/10/22 2100   traZODone (DESYREL) tablet 100 mg, 100 mg, Oral, QHS PRN, Marylyn Ishihara, Tyrone A, DO, 100 mg at 01/09/22 2146   ziprasidone (GEODON) injection 10 mg, 10 mg, Intramuscular, PRN, Marylyn Ishihara, Tyrone A, DO, 10 mg at 01/08/22 1139  Allergies: Allergies  Allergen Reactions   Ceftin [Cefuroxime Axetil] Hives, Swelling and Other (See Comments)    Swelling of face   Codeine Nausea Only   Compazine [Prochlorperazine] Swelling and Other (See Comments)    Face and tongue swelling       Objective  Vital signs:  Temp:  [97.9 F (36.6 C)-100.2 F (37.9 C)] 98.7 F (37.1 C)  (12/31 0327) Pulse Rate:  [80-102] 85 (12/31 0327) Resp:  [17-20] 17 (12/31 0327) BP: (113-145)/(49-96) 120/63 (12/31 0327) SpO2:  [92 %-95 %] 94 % (12/31 0327)  Psychiatric Specialty Exam:  Presentation  General Appearance:  Disheveled  Eye Contact: Fair  Speech: Garbled; Normal Rate  Speech Volume: Normal  Handedness: Ambidextrous   Mood and Affect  Mood: -- (Confused)  Affect: Congruent (Calm and cooperative to an extent)  Thought Process  Thought Processes: Disorganized  Descriptions of Associations:Loose  Orientation:-- (Oriented only to first name)  Thought Content:Illogical  History of Schizophrenia/Schizoaffective disorder:No  Duration of Psychotic Symptoms:N/A  Hallucinations:Hallucinations: -- (Unable to assess due to delirium)  Ideas of Reference:-- (Unable to assess due to delirium)  Suicidal Thoughts:Suicidal Thoughts: -- (Unable to assess due to delirium)  Homicidal Thoughts:Homicidal Thoughts: -- (Unable to assess due to delirium)   Sensorium  Memory: Recent Poor; Remote Fair; Remote Poor (Unable to assess due to delirium, patient is an unreliable historian at baseline)  Judgment: Impaired  Insight: -- (Impaired)   Executive Functions  Concentration: Poor  Attention Span: Poor  Recall: Poor  Fund of Knowledge: Poor  Language: Poor   Psychomotor Activity  Psychomotor Activity:Psychomotor Activity: Increased (Consistently shifting in bed)   Assets  Assets: Desire for Improvement; Financial Resources/Insurance; Housing; Leisure Time; Social Support   Sleep  Sleep:Sleep: -- (Unable to assess due to delirium)    Physical Exam: Physical Exam Constitutional:      Appearance: She is ill-appearing.  HENT:     Head: Normocephalic and atraumatic.  Eyes:     Conjunctiva/sclera: Conjunctivae normal.     Pupils: Pupils are equal, round, and reactive to light.     Comments: Unable to assess EOM due to delirium   Pulmonary:     Effort: Pulmonary effort is normal.  Musculoskeletal:     Cervical back: Normal range of motion and neck supple.     Comments: No rigidity on passive range of motion  Skin:    General: Skin is warm and dry.     Findings: Lesion present.     Comments: Bilateral lesions on wrists likely from where IV was torn out  Neurological:     Mental Status: She is alert. She is disoriented.     Motor: Weakness present.    ROS Blood pressure 120/63, pulse 85, temperature 98.7 F (37.1 C), temperature source Oral, resp. rate 17, height _0  (1.6 m), weight 87.5 kg, SpO2 94 %. Body mass index is 34.19 kg/m.

## 2022-01-12 DIAGNOSIS — N390 Urinary tract infection, site not specified: Secondary | ICD-10-CM | POA: Diagnosis not present

## 2022-01-12 DIAGNOSIS — Z7189 Other specified counseling: Secondary | ICD-10-CM

## 2022-01-12 DIAGNOSIS — R4589 Other symptoms and signs involving emotional state: Secondary | ICD-10-CM | POA: Diagnosis not present

## 2022-01-12 DIAGNOSIS — R4189 Other symptoms and signs involving cognitive functions and awareness: Secondary | ICD-10-CM | POA: Diagnosis not present

## 2022-01-12 DIAGNOSIS — F03918 Unspecified dementia, unspecified severity, with other behavioral disturbance: Secondary | ICD-10-CM

## 2022-01-12 DIAGNOSIS — Z515 Encounter for palliative care: Secondary | ICD-10-CM | POA: Diagnosis not present

## 2022-01-12 DIAGNOSIS — F0390 Unspecified dementia without behavioral disturbance: Secondary | ICD-10-CM

## 2022-01-12 DIAGNOSIS — R4182 Altered mental status, unspecified: Secondary | ICD-10-CM | POA: Diagnosis not present

## 2022-01-12 DIAGNOSIS — F418 Other specified anxiety disorders: Secondary | ICD-10-CM | POA: Diagnosis not present

## 2022-01-12 NOTE — Consult Note (Addendum)
Consultation Note Date: 01/12/2022   Patient Name: Danielle [name]  DOB: Feb 07, 1945  MRN: [id number]  Age / Sex: 77 y.o., female   PCP: [name], MD Referring Physician: Aline August, MD  Reason for Consultation: Establishing goals of care     Chief Complaint/History of Present Illness:   Patient is a 77 year old female with a past medical history of CAD, pseudodementia, depression/anxiety, diabetes mellitus type 2, hyperlipidemia, hypertension, hypothyroidism, and GERD who was admitted on 12/29/2021 for management of urinary tract infection though patient had been on ED psych hold for 12 days prior due to altered mental status with visual/auditory hallucinations.  Psych has been following to assist with medication adjustments though patient does not qualify for inpatient admission and instead TOC is working with family on facility placement.  Palliative medicine team consulted to assist with complex medical decision making.  Extensive review of EMR prior to seeing patient.  As per psychiatry notes there is concern for pseudodementia versus actual diagnosis of dementia though this cannot be determined while patient acutely hospitalized.  Discussed care with bedside RN as well.  Presented to bedside to see patient.  No family present at bedside.  Patient lying in bed confused.  Patient able to interact though speaking nonsensical and tangential thoughts.  As per TOC note on 01/11/2022, TOC has been working with spouse regarding placement.  As per note "spouse was inappropriate and comments -yelling, stating she has been in the ED and we have done nothing, he cannot take care of her".  TOC to continue following up regarding bed offers as patient has not had any offers at this time.  Primary Diagnoses  Present on Admission:  Altered mental status, unspecified  Pseudodementia  UTI (urinary tract infection)  Depression with anxiety  Hypothyroidism  Hypertension   Hyperlipidemia LDL goal <70  OSA (obstructive sleep apnea)   Palliative Review of Systems: Patient is confused and unable to participate in ROS.  Past Medical History:  Diagnosis Date   Arthritis 02/13/2011   osteoarthritis, hips, knees,s/p Cervical fusion(DDD)   Atherosclerosis of aorta ([place]) 05/30/2015   on CTA chest   CAD (coronary artery disease)    a. 09/2016: Coronary CT showing mid-LAD plaque with 20-50% associated stenosis but no significant stenosis by FFR analysis.    Dementia ([place])    Depression with anxiety    Diabetes mellitus without complication ([place])    Difficult intubation 02/13/2011   with surgery -multiple times, no problems with recent surgeries   GERD (gastroesophageal reflux disease) 02/13/2011   tx. Omeprazole   Glaucoma 02/13/2011   bil. tx. eye drops daily   Headache(784.0) 02/13/2011   past hx. migraines, none recent   Heart murmur 02/13/2011   was told-no issues with this   Hypercholesterolemia    Hypertension 02/13/2011   Hypothyroidism 02/13/2011   tx. with Levothyroxine   Major depressive disorder    Mitral regurgitation 06/01/2015   Mild   Sinus drainage 02/13/2011   uses Zyrtec as needed   Sleep apnea 02/13/2011   Hx. sleep apnea-uses cpap since 10'12 nightly   Social History   Socioeconomic History   Marital status: Married    Spouse name: Not on file   Number of children: Not on file   Years of education: Not on file   Highest education level: Not on file  Occupational History   Occupation: Retired Not working  Tobacco Use   Smoking status: Never   Smokeless tobacco: Never  Media planner  Vaping Use: Never used  Substance and Sexual Activity   Alcohol use: Yes    Comment: rare   Drug use: No   Sexual activity: Yes    Birth control/protection: Surgical    Comment: hyst  Other Topics Concern   Not on file  Social History Narrative   Caffeine le-ss then once cup daily. Education: HS diploma.  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     Social Determinants of Health   Financial Resource Strain: Not on file  Food Insecurity: No Food Insecurity (11/26/2021)   Hunger Vital Sign    Worried About Running Out of Food in the Last Year: Never true    Ran Out of Food in the Last Year: Never true  Transportation Needs: No Transportation Needs (11/26/2021)   PRAPARE - Hydrologist (Medical): No    Lack of Transportation (Non-Medical): No  Physical Activity: Not on file  Stress: Not on file  Social Connections: Not on file   Family History  Problem Relation Age of Onset   Cancer Mother        lung   Breast cancer Maternal Aunt    Alzheimer's disease Neg Hx    Dementia Neg Hx    Scheduled Meds:  levothyroxine  137 mcg Oral Q0600   metoprolol tartrate  12.5 mg Oral Q2000   QUEtiapine  100 mg Oral QHS   QUEtiapine  25 mg Oral BID   rosuvastatin  40 mg Oral Daily   senna-docusate  1 tablet Oral QHS   tamsulosin  0.4 mg Oral QHS   Continuous Infusions:  piperacillin-tazobactam (ZOSYN)  IV 3.375 g (01/12/22 0313)   PRN Meds:.acetaminophen, diclofenac Sodium, traZODone Allergies  Allergen Reactions   Ceftin [Cefuroxime Axetil] Hives, Swelling and Other (See Comments)    Swelling of face   Codeine Nausea Only   Compazine [Prochlorperazine] Swelling and Other (See Comments)    Face and tongue swelling   CBC:    Component Value Date/Time   WBC 8.2 01/11/2022 0805   HGB 13.7 01/11/2022 0805   HGB 14.9 12/18/2020 0000   HCT 42.1 01/11/2022 0805   HCT 44.8 12/18/2020 0000   PLT 278 01/11/2022 0805   PLT 259 12/18/2020 0000   MCV 91.7 01/11/2022 0805   MCV 90 12/18/2020 0000   NEUTROABS 5.8 01/08/2022 0812   NEUTROABS 5.1 12/18/2020 0000   LYMPHSABS 1.8 01/08/2022 0812   LYMPHSABS 2.2 12/18/2020 0000   MONOABS 0.8 01/08/2022 0812   EOSABS 0.3 01/08/2022 0812   EOSABS 0.1 12/18/2020 0000   BASOSABS 0.1 01/08/2022 0812   BASOSABS 0.0 12/18/2020  0000   Comprehensive Metabolic Panel:    Component Value Date/Time   NA 136 01/11/2022 0805   NA 140 12/18/2020 0000   K 3.8 01/11/2022 0805   CL 103 01/11/2022 0805   CO2 23 01/11/2022 0805   BUN 21 01/11/2022 0805   BUN 16 12/18/2020 0000   CREATININE 1.23 (H) 01/11/2022 0805   GLUCOSE 174 (H) 01/11/2022 0805   CALCIUM 9.5 01/11/2022 0805   AST 22 01/11/2022 0805   ALT 16 01/11/2022 0805   ALKPHOS 62 01/11/2022 0805   BILITOT 1.1 01/11/2022 0805   BILITOT 0.4 12/18/2020 0000   PROT 6.4 (L) 01/11/2022 0805   PROT 6.4 12/18/2020 0000   ALBUMIN 3.5 01/11/2022 0805   ALBUMIN 4.3 12/18/2020 0000    Physical Exam: Vital Signs: BP 106/69 (BP Location: Left Arm)  Pulse 84   Temp 97.8 F (36.6 C) (Oral)   Resp 19   Ht '5\' 3"'$  (1.6 m)   Wt 87.5 kg   SpO2 97%   BMI 34.19 kg/m  SpO2: SpO2: 97 % O2 Device: O2 Device: Room Air O2 Flow Rate:   Intake/output summary:  Intake/Output Summary (Last 24 hours) at 01/12/2022 0911 Last data filed at 01/12/2022 0323 Gross per 24 hour  Intake 131.87 ml  Output 600 ml  Net -468.13 ml   LBM:   Baseline Weight: Weight: 87.5 kg Most recent weight: Weight: 87.5 kg  General: NAD, awake, confused, tangential thoughts Eyes: No drainage noted HENT: moist mucous membranes Cardiovascular: RRR Respiratory: no increased work of breathing noted, not in respiratory distress Abdomen: not distended Extremities: Moving upper extremities spontaneously Skin: no rashes or lesions on visible skin Neuro: Tangential thoughts, not appropriately following commands Psych: Confused          Palliative Performance Scale: 40%               Additional Data Reviewed: Recent Labs    01/10/22 1128 01/11/22 0805  WBC 8.3 8.2  HGB 13.9 13.7  PLT 284 278  NA 139 136  BUN 22 21  CREATININE 1.06* 1.23*    Imaging: MR LUMBAR SPINE WO CONTRAST CLINICAL DATA:  Myelopathy; psychiatric evaluation  EXAM: MRI THORACIC AND LUMBAR SPINE WITHOUT  CONTRAST  TECHNIQUE: Multiplanar and multiecho pulse sequences of the thoracic and lumbar spine were obtained without intravenous contrast.  COMPARISON:  None Available.  FINDINGS: Evaluation is limited by motion artifact. This particularly limits evaluation of the lumbar spine, as no axial sequences could be obtained, and the STIR sequence is nondiagnostic. The thoracic axial medic sequence is also nondiagnostic.  MRI THORACIC SPINE FINDINGS  Alignment: Mild S shaped curvature of the thoracolumbar spine. No listhesis.  Vertebrae: No fracture, evidence of discitis, or bone lesion.  Cord:  Normal signal and morphology.  Paraspinal and other soft tissues: Negative.  Disc levels:  No high-grade spinal canal stenosis. Moderate bilateral neural foraminal narrowing at T1-T2.  MRI LUMBAR SPINE FINDINGS  Segmentation: 5 lumbar type vertebral bodies are presumed. The last fully formed disc space is labeled L5-S1.  Alignment:  3 mm anterolisthesis L5 on S1.  Vertebrae: No acute fracture or suspicious osseous lesion. Chronic appearing compression fracture of L3 with approximately 40% vertebral body height loss anteriorly. T1 and T2 hyperintense foci, the largest of which is in L1, consistent with benign hemangiomas.  Conus medullaris and cauda equina: Conus extends to the L1-L2 level. Evaluation of the conus and cauda equina is limited.  Paraspinal and other soft tissues: Negative.  Disc levels:  No high-grade spinal canal stenosis. Evaluation of the neural foramina is limited by motion.  IMPRESSION: 1. Evaluation is limited by motion artifact. This particularly limits evaluation of the lumbar spine, as no axial sequences could be obtained, and the STIR sequence is nondiagnostic. 2. Within this limitation, there is no high-grade spinal canal stenosis in the thoracic or lumbar spine. Moderate bilateral neural foraminal narrowing at T1-T2. The neural foramina in the  lumbar spine cannot be evaluated.  If there is persistent concern for spinal pathology, consider repeating the exam when the patient is better able to tolerate it.  Electronically Signed   By: Merilyn Baba M.D.   On: 12/29/2021 23:19 MR THORACIC SPINE WO CONTRAST CLINICAL DATA:  Myelopathy; psychiatric evaluation  EXAM: MRI THORACIC AND LUMBAR SPINE WITHOUT CONTRAST  TECHNIQUE: Multiplanar  and multiecho pulse sequences of the thoracic and lumbar spine were obtained without intravenous contrast.  COMPARISON:  None Available.  FINDINGS: Evaluation is limited by motion artifact. This particularly limits evaluation of the lumbar spine, as no axial sequences could be obtained, and the STIR sequence is nondiagnostic. The thoracic axial medic sequence is also nondiagnostic.  MRI THORACIC SPINE FINDINGS  Alignment: Mild S shaped curvature of the thoracolumbar spine. No listhesis.  Vertebrae: No fracture, evidence of discitis, or bone lesion.  Cord:  Normal signal and morphology.  Paraspinal and other soft tissues: Negative.  Disc levels:  No high-grade spinal canal stenosis. Moderate bilateral neural foraminal narrowing at T1-T2.  MRI LUMBAR SPINE FINDINGS  Segmentation: 5 lumbar type vertebral bodies are presumed. The last fully formed disc space is labeled L5-S1.  Alignment:  3 mm anterolisthesis L5 on S1.  Vertebrae: No acute fracture or suspicious osseous lesion. Chronic appearing compression fracture of L3 with approximately 40% vertebral body height loss anteriorly. T1 and T2 hyperintense foci, the largest of which is in L1, consistent with benign hemangiomas.  Conus medullaris and cauda equina: Conus extends to the L1-L2 level. Evaluation of the conus and cauda equina is limited.  Paraspinal and other soft tissues: Negative.  Disc levels:  No high-grade spinal canal stenosis. Evaluation of the neural foramina is limited by motion.  IMPRESSION: 1.  Evaluation is limited by motion artifact. This particularly limits evaluation of the lumbar spine, as no axial sequences could be obtained, and the STIR sequence is nondiagnostic. 2. Within this limitation, there is no high-grade spinal canal stenosis in the thoracic or lumbar spine. Moderate bilateral neural foraminal narrowing at T1-T2. The neural foramina in the lumbar spine cannot be evaluated.  If there is persistent concern for spinal pathology, consider repeating the exam when the patient is better able to tolerate it.  Electronically Signed   By: Merilyn Baba M.D.   On: 12/29/2021 23:19 MR BRAIN WO CONTRAST CLINICAL DATA:  Altered mental status.  EXAM: MRI HEAD WITHOUT CONTRAST  TECHNIQUE: Multiplanar, multiecho pulse sequences of the brain and surrounding structures were obtained without intravenous contrast.  COMPARISON:  Same-day CT head, brain MRI 11/23/2021.  FINDINGS: Image quality is degraded by motion artifact, particularly affecting the axial T2 sequence. The exam could not be completed; axial T1 and coronal T2 images were not obtained.  Brain: There is no evidence of acute intracranial hemorrhage, extra-axial fluid collection, or acute infarct (the diffusion sequence is of diagnostic quality.  Parenchymal volume is normal. The ventricles are normal in size. Gray-white differentiation is preserved. Scattered small foci of FLAIR signal abnormality in the supratentorial white matter are nonspecific, likely reflecting mild chronic small-vessel ischemic change  There is no mass lesion.  There is no mass effect or midline shift.  Vascular: Suboptimally assessed due to motion artifact. No definite abnormality.  Skull and upper cervical spine: Normal marrow signal.  Sinuses/Orbits: Grossly unremarkable.  Other: None.  IMPRESSION: No acute intracranial pathology.  Electronically Signed   By: Valetta Mole M.D.   On: 12/29/2021 14:24 CT HEAD WO  CONTRAST (5MM) CLINICAL DATA:  Mental status change, unknown cause.  EXAM: CT HEAD WITHOUT CONTRAST  TECHNIQUE: Contiguous axial images were obtained from the base of the skull through the vertex without intravenous contrast.  RADIATION DOSE REDUCTION: This exam was performed according to the departmental dose-optimization program which includes automated exposure control, adjustment of the mA and/or kV according to patient size and/or use of iterative reconstruction  technique.  COMPARISON:  Head CT 10/27/2021 and MRI 11/23/2021  FINDINGS: Brain: There is no evidence of an acute infarct, intracranial hemorrhage, mass, midline shift, or extra-axial fluid collection. The ventricles and sulci are within normal limits for age. Minimal hypodensities in the cerebral white matter are unchanged and nonspecific but compatible with chronic small vessel ischemia.  Vascular: Calcified atherosclerosis at the skull base. No hyperdense vessel.  Skull: No acute fracture or suspicious osseous lesion.  Sinuses/Orbits: The visualized portions of the paranasal sinuses are clear. Trace right mastoid effusion. Bilateral cataract extraction.  Other: None.  IMPRESSION: No evidence of acute intracranial abnormality.  Electronically Signed   By: Logan Bores M.D.   On: 12/29/2021 11:51 DG Chest Portable 1 View CLINICAL DATA:  Altered mental status.  EXAM: PORTABLE CHEST 1 VIEW  COMPARISON:  AP chest 11/23/2021  FINDINGS: Cardiac silhouette is again mildly enlarged. Mediastinal contours are within normal limits. Mild-to-moderate calcification within the aortic arch. The lungs are clear. No pleural effusion or pneumothorax. Moderate multilevel disc space narrowing and endplate osteophytes of the thoracic spine. ACDF hardware again overlies the lower cervical spine.  IMPRESSION: 1. No acute pulmonary process. 2. Mild cardiomegaly, unchanged.  Electronically Signed   By: Yvonne Kendall  M.D.   On: 12/29/2021 10:21    I personally reviewed recent imaging.   Palliative Care Assessment and Plan Summary of Established Goals of Care and Medical Treatment Preferences   Patient is a 77 year old female with a past medical history of CAD, pseudodementia, depression/anxiety, diabetes mellitus type 2, hyperlipidemia, hypertension, hypothyroidism, and GERD who was admitted on 12/29/2021 for management of urinary tract infection though patient had been on ED psych hold for 12 days prior due to altered mental status with visual/auditory hallucinations.  Psych has been following to assist with medication adjustments though patient does not qualify for inpatient admission and instead TOC is working with family on facility placement.  Palliative medicine team consulted to assist with complex medical decision making.  # Complex medical decision making/goals of care  -Patient unable to participate in complex medical decision-making due to medical status.  -No family present at bedside during visit with patient.  Patient has been in ER on psych hold for 12 past days and recently admitted for UTI management.  Patient's spouse who was primary caregiver has stated to multiple providers that he is unable to manage patient's care at home anymore.  TOC working diligently to determine placement options for patient as that has been spouse's stated goal for patient's care at this time.  -  Code Status: Full Code   # Symptom management  -Confusion/Agitation in setting of Pseudodementia versus dementia as well as infection   -Management as per psychiatry and hospitalist  Non-pharmacologic Delirium Precautions  - Frequent re-orientation; update board w/ date, names of treatment team  - Daytime stimulation: Open curtains, turn lights on, and turn on television as tolerated during waking hours  - Nighttime calm - close curtains, turn lights off, and turn off television at 9pm  with minimal interruptions from  treatment team during sleeping hours, including avoiding lab draws if possible  - Encourage continued presence of family/friends  - Occupy w/ distractions - e.g. ask them to fold washcloths, busy-board  - Encourage movement with PT/OT as tolerated  - Ensure adequate sensorium, to include providing glasses, dentures, and hearing aids to patient as appropriate  - Ensure adequate nutrition and fluid intake  - Assess and treat pain (incl nonverbal cues); e.g.  consider scheduled tylenol  - Assess and treat constipation and urinary retention  - Ensure hydration, electrolyte balance, adequate oxygenation  - Review medications (addition, deletion, changes can trigger)   # Psycho-social/Spiritual Support:  - Support System: As per demographics patient has spouse and son  # Discharge Planning: TOC working on placement and spouse has stated he is unable to care for patient anymore.  Thank you for allowing the palliative care team to participate in the care Danielle [name]. PMT will continue to following along with patient's medical journey at this time. Patient appears to be disposition issue; appreciate TOC's assistance with placement as spouse has stated goal for patient to be placed in facility.   Danielle Aus, DO Palliative Care Provider PMT # [phone]  This provider spent a total of 80 minutes providing patient's care.  Includes review of EMR, discussing care with other staff members involved in patient's medical care, obtaining relevant history and information from patient and/or patient's family, and personal review of imaging and lab work. Greater than 50% of the time was spent counseling and coordinating care related to the above assessment and plan.     Update: Notified later in afternoon husband present at bedside. Asked RN to inform hospitalist as well so he could potentially update husband. Noted PMT provider would follow up if able.

## 2022-01-12 NOTE — Progress Notes (Signed)
Dr. Vinetta Bergamo notified that family is in the room.

## 2022-01-12 NOTE — Progress Notes (Signed)
Physical Therapy Treatment Patient Details Name: Danielle [name] MRN: [id number] DOB: Nov 02, 1945 Today's Date: 01/12/2022   History of Present Illness 77 year old female with medical history significant for diagnosis of pseudodementia, depression, anxiety who presents to the emergency department 12/29/21 with concern for abnormal behaviors, recently in hospital for same.    PT Comments    Pt received sitting up in recliner alert and oriented only to self, able to follow single-step commands consistently with increased time, some responses to queries appropriate. Pt required mod assist +2 for transfers and min assist +2 for short-distance ambulation in room with RW, distance limited by pt request. Pt reporting 10/10 pain in BLE but vague about exact location or pain quality. Repositioned in recliner for comfort. Discharge destination remains appropriate. We will continue to follow acutely.    Recommendations for follow up therapy are one component of a multi-disciplinary discharge planning process, led by the attending physician.  Recommendations may be updated based on patient status, additional functional criteria and insurance authorization.  Follow Up Recommendations  Skilled nursing-short term rehab (<3 hours/day) Can patient physically be transported by private vehicle: No   Assistance Recommended at Discharge Frequent or constant Supervision/Assistance  Patient can return home with the following Two people to help with walking and/or transfers;Two people to help with bathing/dressing/bathroom;Assistance with cooking/housework;Assistance with feeding;Direct supervision/assist for medications management;Direct supervision/assist for financial management;Assist for transportation;Help with stairs or ramp for entrance   Equipment Recommendations  None recommended by PT    Recommendations for Other Services       Precautions / Restrictions Precautions Precautions: Fall Precaution  Comments: Safety, floor pads Restrictions Weight Bearing Restrictions: No     Mobility  Bed Mobility               General bed mobility comments: Pt in recliner at entry and exit    Transfers Overall transfer level: Needs assistance Equipment used: Rolling walker (2 wheels) Transfers: Sit to/from Stand Sit to Stand: Mod assist, +2 safety/equipment           General transfer comment: Pt required mod assistance for lift assistance brining weight forward and up, VCs for use of BUE on RW, +2 for safety    Ambulation/Gait Ambulation/Gait assistance: Min assist, +2 safety/equipment Gait Distance (Feet): 5 Feet Assistive device: Rolling walker (2 wheels) Gait Pattern/deviations: Step-to pattern, Decreased step length - right, Decreased step length - left, Decreased stride length, Decreased dorsiflexion - right, Decreased dorsiflexion - left, Shuffle, Trunk flexed Gait velocity: decreased     General Gait Details: Pt ambulated with min assist for steadying and AD management, VERY short shuffling steps with trunk flexed, at end of 45f pt stating "That's it, I'm done" and took backwards steps back to recliner despite being directed to wait. Further mobility deferred by request.   Stairs             Wheelchair Mobility    Modified Rankin (Stroke Patients Only)       Balance Overall balance assessment: Needs assistance Sitting-balance support: Feet supported, Bilateral upper extremity supported Sitting balance-Leahy Scale: Poor     Standing balance support: Bilateral upper extremity supported, During functional activity, Reliant on assistive device for balance Standing balance-Leahy Scale: Poor                              Cognition Arousal/Alertness: Awake/alert Behavior During Therapy: Restless, Anxious Overall Cognitive Status: No family/caregiver present to determine baseline  cognitive functioning Area of Impairment: Orientation, Attention,  Following commands, Awareness                 Orientation Level: Place, Time, Situation Current Attention Level: Focused   Following Commands: Follows one step commands consistently, Follows one step commands with increased time   Awareness: Intellectual            Exercises      General Comments        Pertinent Vitals/Pain Pain Assessment Pain Assessment: 0-10 Pain Score: 10-Worst pain ever Breathing: normal Negative Vocalization: none Facial Expression: smiling or inexpressive Body Language: tense, distressed pacing, fidgeting Consolability: no need to console PAINAD Score: 1 Pain Location: BLEs Pain Descriptors / Indicators: Discomfort, Grimacing, Moaning Pain Intervention(s): Limited activity within patient's tolerance, Monitored during session, Repositioned    Home Living Family/patient expects to be discharged to:: Private residence Living Arrangements: Spouse/significant other Available Help at Discharge: Family Type of Home: House Home Access: Stairs to enter   Technical brewer of Steps: 2 Alternate Level Stairs-Number of Steps: flight- has chair lift Home Layout: Two level;1/2 bath on main level Home Equipment: Conservation officer, nature (2 wheels);Shower seat;Shower seat - built in;Cane - single point;Adaptive equipment Additional Comments: information is from past hospital visits. patient unable to provide this information today    Prior Function            PT Goals (current goals can now be found in the care plan section) Acute Rehab PT Goals Patient Stated Goal: none stated PT Goal Formulation: Patient unable to participate in goal setting Time For Goal Achievement: 01/13/22 Potential to Achieve Goals: Fair Progress towards PT goals: Progressing toward goals    Frequency    Min 2X/week      PT Plan Current plan remains appropriate    Co-evaluation              AM-PAC PT "6 Clicks" Mobility   Outcome Measure  Help needed  turning from your back to your side while in a flat bed without using bedrails?: A Lot Help needed moving from lying on your back to sitting on the side of a flat bed without using bedrails?: Total Help needed moving to and from a bed to a chair (including a wheelchair)?: A Lot Help needed standing up from a chair using your arms (e.g., wheelchair or bedside chair)?: A Lot Help needed to walk in hospital room?: A Lot Help needed climbing 3-5 steps with a railing? : Total 6 Click Score: 10    End of Session Equipment Utilized During Treatment: Gait belt Activity Tolerance: Patient tolerated treatment well;No increased pain Patient left: in chair;with call bell/phone within reach;with chair alarm set Nurse Communication: Mobility status PT Visit Diagnosis: Unsteadiness on feet (R26.81)     Time: 2725-3664 PT Time Calculation (min) (ACUTE ONLY): 14 min  Charges:  $Gait Training: 8-22 mins                     Coolidge Breeze, PT, DPT [facility] Office: [phone] Weekend pager: [phone]   Coolidge Breeze 01/12/2022, 2:42 PM

## 2022-01-12 NOTE — Progress Notes (Addendum)
PROGRESS NOTE    Danielle [name]  NTZ:[id number] DOB: Aug 30, 1945 DOA: 12/29/2021 PCP: [name], MD   Brief Narrative:   77 y.o. female with medical history significant of CAD, pseudo-dementia, depression/anxiety, DM2, HLD, HTN, hypothyroidism, GERD who was in the ED psych hold for 12-days due to altered mental status with visual/auditory hallucinations and was evaluated by psych and found to not qualify for inpatient admission and TOC was working on facility placement.  She was having intermittent fevers in the ED not improving with oral Cipro which was started for possible UTI.  She was subsequently admitted and started on IV Zosyn.  Psychiatry was consulted.  Assessment & Plan:   UTI: Present on admission -Cultures negative so far.  Currently on Zosyn.  Afebrile for the last 24 hours.  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  Continue Seroquel along with trazodone nightly as needed for sleep.  Prozac/hydroxyzine/Geodon discontinued by psychiatry. -Fall precautions.  Delirium precautions.  Hypertension -Monitor blood pressure.  Continue metoprolol  Hypothyroidism -Continue levothyroxine  Hyperlipidemia -Continue statin  Diabetes mellitus type 2 -Last A1c 5.7.  Blood sugars currently stable.  Carb modified diet  OSA -Continue CPAP nightly  Chronic kidney disease stage IIIa -Creatinine currently stable.  Monitor intermittently.  Goals of care -Perative care consultation pending  Physical deconditioning -PT eval pending.  TOC following for SNF placement. Currently medically stable for discharge to SNF; can switch to oral antibiotics on discharge.    DVT prophylaxis: Start Lovenox Code Status: Full Family  Communication: None at bedside Disposition Plan: Status is: Observation The patient will require care spanning > 2 midnights and should be moved to inpatient because: Of need for IV antibiotics    Consultants:  psychiatry and palliative care  Procedures: None  Antimicrobials:  Anti-infectives (From admission, onward)    Start     Dose/Rate Route Frequency Ordered Stop   01/10/22 2000  piperacillin-tazobactam (ZOSYN) IVPB 3.375 g        3.375 g 12.5 mL/hr over 240 Minutes Intravenous Every 8 hours 01/10/22 1405     01/10/22 1415  piperacillin-tazobactam (ZOSYN) IVPB 3.375 g        3.375 g 100 mL/hr over 30 Minutes Intravenous  Once 01/10/22 1408 01/10/22 1538   01/10/22 1330  ciprofloxacin (CIPRO) IVPB 400 mg  Status:  Discontinued        400 mg 200 mL/hr over 60 Minutes Intravenous  Once 01/10/22 1327 01/10/22 1342   01/08/22 0330  ciprofloxacin (CIPRO) tablet 500 mg  Status:  Discontinued        500 mg Oral 2 times daily 01/08/22 6759 01/10/22 1405        Subjective: Patient seen and examined at bedside.  Very poor historian.  No seizures, agitation or vomiting reported. Objective: Vitals:   01/11/22 0327 01/11/22 1434 01/11/22 1927 01/12/22 0310  BP: 120/63 (!) 112/99 (!) 143/79 106/69  Pulse: 85 (!) 106 (!) 108 84  Resp: '17 18 20 19  '$ Temp: 98.7 F (37.1 C) 98.5 F (36.9 C) 99 F (37.2 C) 97.8 F (36.6 C)  TempSrc: Oral Oral Oral Oral  SpO2: 94% 95% 94% 97%  Weight:      Height:        Intake/Output Summary (Last 24 hours) at 01/12/2022 0750 Last data filed at 01/12/2022 [phone]  Gross per 24 hour  Intake 371.87 ml  Output 600 ml  Net -228.13 ml    Filed Weights   12/29/21 0908  Weight: 87.5 kg    Examination:  General: On room air.  No distress.  Looks chronically ill and deconditioned.  Flat affect.  Slow to respond.  Speaks in sentences which do not make sense.   respiratory: Decreased breath sounds at bases bilaterally with some crackles CVS: Currently  rate controlled; S1-S2 heard  abdominal: Soft, nontender, slightly distended, no organomegaly; normal bowel sounds are heard  extremities: Trace lower extremity edema; no clubbing.     Data Reviewed: I have personally reviewed following labs and imaging studies  CBC: Recent Labs  Lab 01/08/22 0812 01/10/22 1128 01/11/22 0805  WBC 8.8 8.3 8.2  NEUTROABS 5.8  --   --   HGB 14.7 13.9 13.7  HCT 44.5 41.7 42.1  MCV 90.1 89.5 91.7  PLT 295 284 732    Basic Metabolic Panel: Recent Labs  Lab 01/08/22 0812 01/10/22 1128 01/11/22 0805  NA 138 139 136  K 3.6 3.9 3.8  CL 105 105 103  CO2 '23 25 23  '$ GLUCOSE 108* 157* 174*  BUN '14 22 21  '$ CREATININE 1.02* 1.06* 1.23*  CALCIUM 9.7 9.7 9.5    GFR: Estimated Creatinine Clearance: 41.4 mL/min (A) (by C-G formula based on SCr of 1.23 mg/dL (H)). Liver Function Tests: Recent Labs  Lab 01/10/22 1128 01/11/22 0805  AST 18 22  ALT 16 16  ALKPHOS 64 62  BILITOT 1.0 1.1  PROT 6.4* 6.4*  ALBUMIN 3.4* 3.5    No results for input(s): "LIPASE", "AMYLASE" in the last 168 hours. No results for input(s): "AMMONIA" in the last 168 hours. Coagulation Profile: No results for input(s): "INR", "PROTIME" in the last 168 hours. Cardiac Enzymes: No results for input(s): "CKTOTAL", "CKMB", "CKMBINDEX", "TROPONINI" in the last 168 hours. BNP (last 3 results) No results for input(s): "PROBNP" in the last 8760 hours. HbA1C: No results for input(s): "HGBA1C" in the last 72 hours. CBG: No results for input(s): "GLUCAP" in the last 168 hours. Lipid Profile: No results for input(s): "CHOL", "HDL", "LDLCALC", "TRIG", "CHOLHDL", "LDLDIRECT" in the last 72 hours. Thyroid Function Tests: No results for input(s): "TSH", "T4TOTAL", "FREET4", "T3FREE", "THYROIDAB" in the last 72 hours. Anemia Panel: No results for input(s): "VITAMINB12", "FOLATE", "FERRITIN", "TIBC", "IRON", "RETICCTPCT" in the last 72 hours. Sepsis Labs: Recent Labs  Lab  01/08/22 2025 01/10/22 1128  LATICACIDVEN 1.6 1.4     Recent Results (from the past 240 hour(s))  Urine Culture     Status: Abnormal   Collection Time: 01/08/22  2:40 AM   Specimen: Urine, Clean Catch  Result Value Ref Range Status   Specimen Description   Final    URINE, CLEAN CATCH Performed at [facility], [place] [address], [place], [place] [address]    Special Requests   Final    NONE Performed at [facility], [place] [address], [place], [place] [address]    Culture MULTIPLE SPECIES PRESENT, SUGGEST RECOLLECTION (A)  Final   Report Status 01/09/2022 FINAL  Final  Urine Culture     Status: None   Collection Time: 01/10/22 11:15 AM   Specimen: In/Out Cath Urine  Result Value Ref Range Status   Specimen Description   Final    IN/OUT CATH URINE Performed at [place] [address] [place], [place] [address]    Special Requests  Final    NONE Performed at [facility], [place] [address] [place], [place] [address]    Culture   Final    NO GROWTH Performed at [facility], [place] [address], [place], [place] [address]    Report Status 01/11/2022 FINAL  Final         Radiology Studies: No results found.      Scheduled Meds:  levothyroxine  137 mcg Oral Q0600   metoprolol tartrate  12.5 mg Oral Q2000   QUEtiapine  100 mg Oral QHS   QUEtiapine  25 mg Oral BID   rosuvastatin  40 mg Oral Daily   senna-docusate  1 tablet Oral QHS   tamsulosin  0.4 mg Oral QHS   Continuous Infusions:  piperacillin-tazobactam (ZOSYN)  IV 3.375 g (01/12/22 0313)          Aline August, MD [facility] 01/12/2022, 7:51 AM

## 2022-01-13 DIAGNOSIS — Z7189 Other specified counseling: Secondary | ICD-10-CM | POA: Diagnosis not present

## 2022-01-13 DIAGNOSIS — F418 Other specified anxiety disorders: Secondary | ICD-10-CM | POA: Diagnosis not present

## 2022-01-13 DIAGNOSIS — F03918 Unspecified dementia, unspecified severity, with other behavioral disturbance: Secondary | ICD-10-CM | POA: Diagnosis not present

## 2022-01-13 DIAGNOSIS — R4589 Other symptoms and signs involving emotional state: Secondary | ICD-10-CM

## 2022-01-13 DIAGNOSIS — Z515 Encounter for palliative care: Secondary | ICD-10-CM | POA: Diagnosis not present

## 2022-01-13 DIAGNOSIS — R4182 Altered mental status, unspecified: Secondary | ICD-10-CM | POA: Diagnosis not present

## 2022-01-13 DIAGNOSIS — R4189 Other symptoms and signs involving cognitive functions and awareness: Secondary | ICD-10-CM | POA: Diagnosis not present

## 2022-01-13 DIAGNOSIS — N390 Urinary tract infection, site not specified: Secondary | ICD-10-CM | POA: Diagnosis not present

## 2022-01-13 NOTE — Progress Notes (Signed)
Daily Progress Note   Patient Name: Danielle [name]       Date: 01/13/2022 DOB: 11-02-1945  Age: 77 y.o. MRN#: [id number] Attending Physician: Danielle August, MD Primary Care Physician: Danielle Cha, MD Admit Date: 12/29/2021 Length of Stay: 0 days  Reason for Consultation/Follow-up: Establishing goals of care  Subjective:   CC: Patient remains confused agitated today.  Following up regarding complex medical decision making.  Subjective:  Presented to bedside to see patient. No family present at bedside. Patient lying in bed confused. Patient able to interact though still confused and unable to participate in complex medical decision making.  Patient appears less agitated when seen during visit today.  Further reviewed patient's EMR.  Patient has MOST form on file that was signed 11/12/2020.  Patient's MOST form states: Attempt resuscitation, full scope of treatment, antibiotics if indicated, IV fluids if indicated, and feeding tube for defined trial period.  This was completed by patient herself and physician at that time.  Able to call and speak with patient's husband, Danielle [name].  Introduced myself and the role of the palliative medicine team.  Spent time while Danielle provided details regarding patient's long medical journey since July 2023.  Discussed patient's continued mental deterioration which he feels was initiated in July when patient began having panic attacks.  Danielle does admit that patient may have had aspects of forgetfulness prior to July though became much more prominent since then.  Patient was much more functional and independent before July.  It has been sole caregiver for patient since then.  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 He notes patient has remained very agitated and confused at home.  At has taken patient to see multiple psychiatrist which diagnosed her with pseudodementia.  Danielle does state though that since patient was  seen by psychiatry in the hospital and medication adjustments were made, he feels patient is actually more responsive and less agitated.  Able to discuss able to review aspects of psychiatry's note from 01/11/2022.  Discussed psychiatry has further adjusted medications and mention concern for need for follow-up from a psychiatric standpoint in the outpatient setting.  Mention of concern that patient may have underlying dementia (possible vascular component) versus pseudodementia.  Dementia cannot be diagnosed while patient is admitted to the hospital. Danielle also described continued discussions with social workers to work on facility placement for patient since he is unable to provide a safe level of care for at home. Danielle describes going through process of appeal with Medicare to even have patient admitted to the hospital.  That time acknowledging the patient's and his difficult journey and empathized with patient.  Provided emotional support via active listening.  Inquired about further family support for patient and Danielle.  And in the patient had been married for 6 years and known each other longer than that time.  Danielle notes that patient's son, Danielle [name], has not participated in caring for patient at home as per Danielle [name] noted it was "too difficult to see patient like this".   Discussed with Danielle that in the setting of patient not having capacity to make her own medical decisions at this time, technically in the state of [place] if patient does not have ACP documentation naming HCPOA, Danielle is her Education officer, community.  Also reviewed that patient has MOST form on file in EMR from 12/01/2020.  Explained to him what was elected by patient in the MOST form she  completed.  Also discussed that should patient's medical status continued to deteriorate, would encourage further discussions regarding the care patient has elected as need to consider patient's quality of life and how she was feeling at time when she completed said  MOST form.  Danielle open to further discussions as medical journey continues.  All questions answered at that time.  Thanked Danielle for allowing me to speak with him today. Danielle voiced appreciation for the call.  Review of Systems  Objective:   Vital Signs:  BP 120/80 (BP Location: Right Arm)   Pulse 83   Temp 98.9 F (37.2 C) (Oral)   Resp 20   Ht '5\' 3"'$  (1.6 m)   Wt 87.5 kg   SpO2 98%   BMI 34.19 kg/m   Physical Exam: General: NAD, awake, more interactive today and calm though confused Eyes: No drainage noted HENT: moist mucous membranes Cardiovascular: RRR Respiratory: no increased work of breathing noted, not in respiratory distress Abdomen: not distended Extremities: Moving upper extremities spontaneously Skin: no rashes or lesions on visible skin Neuro: awake, interactive though unable to participate in complex medical decision making  Psych: Confused  Imaging:  I personally reviewed recent imaging.   Assessment & Plan:   Assessment: Patient is a 77 year old female with a past medical history of CAD, pseudodementia, depression/anxiety, diabetes mellitus type 2, hyperlipidemia, hypertension, hypothyroidism, and GERD who was admitted on 12/29/2021 for management of urinary tract infection though patient had been on Danielle psych hold for 12 days prior due to altered mental status with visual/auditory hallucinations. Psych has been following to assist with medication adjustments though patient does not qualify for inpatient admission and instead TOC is working with family on facility placement. Palliative medicine team consulted to assist with complex medical decision making.   Recommendations/Plan: # Complex medical decision making/goals of care:  -Patient unable to participate in complex medical decision-making due to medical status.                -Patient has MOST form on file that was signed 11/12/2020.  Patient's MOST form states: Attempt resuscitation, full scope of treatment,  antibiotics if indicated, IV fluids if indicated, and feeding tube for defined trial period.  This was completed by patient herself and physician present at that time.  -Discussed care with patient's spouse, Danielle, as described above in HPI.  Offered emotional support as able via active listening.  Explained that if patient does not have ACP documentation naming HCPOA, in the state of [place] explained that if patient does not have ACP documentation naming HCPOA, in the state of [place] and other would be default medical decision maker when patient is unable to make medical decisions for herself.  Expressed that if patient's medical status continues to deteriorate, would recommend further conversations regarding aggressiveness of medical interventions knowing patient and what aspects of quality of life would be important for her to maintain.  -  Code Status: Full Code  Non-pharmacologic Delirium Precautions  - Frequent re-orientation; update board w/ date, names of treatment team  - Daytime stimulation: Open curtains, turn lights on, and turn on television as tolerated during waking hours  - Nighttime calm - close curtains, turn lights off, and turn off television at 9pm  with minimal interruptions from treatment team during sleeping hours, including avoiding lab draws if possible  - Encourage continued presence of family/friends  - Occupy w/ distractions - e.g. ask them to fold washcloths, busy-board  - Encourage movement with  PT/OT as tolerated  - Ensure adequate sensorium, to include providing glasses, dentures, and hearing aids to patient as appropriate  - Ensure adequate nutrition and fluid intake  - Assess and treat pain (incl nonverbal cues); e.g. consider scheduled tylenol  - Assess and treat constipation and urinary retention  - Ensure hydration, electrolyte balance, adequate oxygenation  - Review medications (addition, deletion, changes can trigger)   # Symptom management:    -Confusion/Agitation in setting of Pseudodementia versus dementia as well as infection                               -Management as per psychiatry and hospitalist  # Psychosocial Support:  - Husband, son   # Discharge Planning: TOC working on facility placement   Discussed with: husband   Thank you for allowing the palliative care team to participate in the care Yetta Glassman, DO Palliative Care Provider PMT # [phone]  If patient remains symptomatic despite maximum doses, please call PMT at [phone] between 0700 and 1900. Outside of these hours, please call attending, as PMT does not have night coverage.  This provider spent a total of 57 minutes providing patient's care.  Includes review of EMR, discussing care with other staff members involved in patient's medical care, obtaining relevant history and information from patient and/or patient's family, and personal review of imaging and lab work. Greater than 50% of the time was spent counseling and coordinating care related to the above assessment and plan.

## 2022-01-13 NOTE — Progress Notes (Signed)
Patient declines nocturnal CPAP tonight.

## 2022-01-13 NOTE — Care Management Obs Status (Signed)
[facility] NOTIFICATION   Patient Details  Name: Danielle [name] MRN: [id number] Date of Birth: 1945/06/28   Medicare Observation Status Notification Given:  Yes    Angelita Ingles, RN 01/13/2022, 10:14 AM

## 2022-01-13 NOTE — TOC Progression Note (Signed)
Transition of Care [facility]) - Progression Note    Patient Details  Name: Danielle [name] MRN: [id number] Date of Birth: [birth year]  Transition of Care [facility]) CM/SW [place], RN Phone [phone]  01/13/2022, 1:46 PM  Clinical Narrative:    TOC continues to follow for SNF placement. CM received message that husband is at bedside and very concerned wanting to speak with someone about wife. CM at bedside introduced self to husband explained role of CM. Husband verbalized understanding and begins to tell the CM that he is so upset because his wife has been in the hospital for 2 weeks and he has not once spoken with a doctor. Husband expresses that he is so overwhelmed because he does not know what to do for his wife and he feel like her symptoms have not been managed. CM explains to husband that patient currently has SNF recommendation and that [facility] has faxed patients info out but currently has no bed offers. CM has reassured husband that CM would make physician aware that husband would like to speak with him. CM has also explained the process for SNF bed offers. CM has explained to husband that [facility] will work closely with him for disposition planning. CM has faxed updated to information for review for potential bed offer to the facilities listed below:  [facility]F [facility]F [facility] [place] [facility] [place] [facility] [place] [facility] [place] [facility] [place] [facility] [place] [facility] [place] [facility] [place] [facility] [place] [facility] Preferred [facility]F [place] [facility] [place] [facility] [place] [facility] [place]ry[place] [facility] [place] [facility] [place] [facility]       Barriers to Discharge: Continued Medical Work up  Expected Discharge Plan and Services In-house Referral: NA Discharge Planning Services: CM Consult Post Acute Care Choice: [place] arrangements for the past 2 months: Single Family Home                   DME Agency: NA       HH Arranged: NA HH Agency: NA         Social Determinants of Health ([place]) Interventions SDOH Screenings   Food Insecurity: No Food Insecurity (11/26/2021)  Housing: [place]  (11/26/2021)  Transportation Needs: No Transportation Needs (11/26/2021)  Utilities: Not At Risk (11/26/2021)  Alcohol Screen: Low Risk  (11/26/2021)  Depression (PHQ2-9): High Risk (10/09/2021)  Tobacco Use: Low Risk  (12/29/2021)    Readmission Risk Interventions    10/31/2021    5:06 PM  Readmission Risk Prevention Plan  Transportation Screening Complete  PCP or Specialist Appt within 3-5 Days Complete  HRI or [place] Complete  Social Work Consult for [place] Planning/Counseling Complete  Palliative Care Screening Not Applicable  Medication Review Press photographer) Complete

## 2022-01-13 NOTE — Consult Note (Addendum)
  Danielle [name] is a 77 y.o. female admitted medically for 12/29/2021  8:50 AM for altered mental status with audiovisual hallucinations. She carries the psychiatric diagnoses of pseudodementia, depression and anxiety and has a past medical history of CAD, GERD, hypertension, hyperlipidemia, type 2 diabetes, sleep apnea. Psychiatry was consulted for husband wants to speak to psychiatry team by [facility].   Psychiatric nurse practitioner phoned husband about his concerns. He inquired about her psychiatric medications. Discussed with husband she is on Seroquel and Trazodone, to manage agitation, anxiety, and insomnia. All questions, concerns and comments were addressed. Will dc psych consult at this time as no NEW inpatient psychiatric concerns have been identified. Patient husband is serving as advocate and designated party, no consent was needed. Patient husband is advised he can contact office of patient experience to file a grievance if he is unhappy with care his wife is receiving. From what I can ascertain he has contacted CMS and filed an appeal and grievance.   Psychiatric consult service to sign off at this time.  -No charge filed.  Phone call less than 10 minutes.

## 2022-01-13 NOTE — Progress Notes (Addendum)
PROGRESS NOTE    Danielle [name]  BHA:[id number] DOB: 1945/07/15 DOA: 12/29/2021 PCP: [name], MD   Brief Narrative:   77 y.o. female with medical history significant of CAD, pseudo-dementia, depression/anxiety, DM2, HLD, HTN, hypothyroidism, GERD who was in the ED psych hold for 12-days due to altered mental status with visual/auditory hallucinations and was evaluated by psych and found to not qualify for inpatient admission and TOC was working on facility placement.  She was having intermittent fevers in the ED not improving with oral Cipro which was started for possible UTI.  She was subsequently admitted and started on IV Zosyn.  Psychiatry was consulted.  Assessment & Plan:   UTI: Present on admission -Cultures negative so far.  Currently on Zosyn.  Afebrile for the last 48 hours.  Patient was on Cipro for few days prior to that.  DC antibiotics and monitor.    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  Continue Seroquel along with trazodone nightly as needed for sleep.  Prozac/hydroxyzine/Geodon discontinued by psychiatry. -Fall precautions.  Delirium precautions.  Hypertension -Monitor blood pressure.  Continue metoprolol  Hypothyroidism -Continue levothyroxine  Hyperlipidemia -Continue statin  Diabetes mellitus type 2 -Last A1c 5.7.  Blood sugars currently stable.  Carb modified diet  OSA -Continue CPAP nightly  Chronic kidney disease stage IIIa -Creatinine currently stable.  Monitor intermittently.  Goals of care -Palliative care consultation appreciated.  Patient remains full code.  Physical deconditioning -PT eval pending.  TOC following for SNF placement. Currently medically stable for discharge to  SNF   DVT prophylaxis: Start Lovenox Code Status: Full Family Communication: spoke to husband on phone Disposition Plan: Status is: Observation The patient will require care spanning > 2 midnights and should be moved to inpatient because: Of need for IV antibiotics    Consultants:  psychiatry and palliative care  Procedures: None  Antimicrobials:  Anti-infectives (From admission, onward)    Start     Dose/Rate Route Frequency Ordered Stop   01/10/22 2000  piperacillin-tazobactam (ZOSYN) IVPB 3.375 g        3.375 g 12.5 mL/hr over 240 Minutes Intravenous Every 8 hours 01/10/22 1405     01/10/22 1415  piperacillin-tazobactam (ZOSYN) IVPB 3.375 g        3.375 g 100 mL/hr over 30 Minutes Intravenous  Once 01/10/22 1408 01/10/22 1538   01/10/22 1330  ciprofloxacin (CIPRO) IVPB 400 mg  Status:  Discontinued        400 mg 200 mL/hr over 60 Minutes Intravenous  Once 01/10/22 1327 01/10/22 1342   01/08/22 0330  ciprofloxacin (CIPRO) tablet 500 mg  Status:  Discontinued        500 mg Oral 2 times daily 01/08/22 9735 01/10/22 1405        Subjective: Patient seen and examined at bedside.  Extremely poor historian.  No agitation, seizures, vomiting reported.   Objective: Vitals:   01/12/22 0310 01/12/22 1342 01/12/22 2038 01/13/22 0534  BP: 106/69 104/69 (!) 114/52 128/66  Pulse: 84 92 92 82  Resp: '19 19 16 18  '$ Temp: 97.8 F (36.6 C) 98.5 F (36.9 C) 99.1 F (37.3 C) 98.1 F (36.7 C)  TempSrc: Oral Oral Oral Oral  SpO2: 97% 97% 97% 96%  Weight:      Height:  Intake/Output Summary (Last 24 hours) at 01/13/2022 0730 Last data filed at 01/12/2022 1700 Gross per 24 hour  Intake 349.09 ml  Output 50 ml  Net 299.09 ml    Filed Weights   12/29/21 0908  Weight: 87.5 kg    Examination:  General: No acute distress currently.  On room air.  Looks chronically ill and deconditioned.  Flat affect.  Still slow to respond.  Speaks in sentences which do not make sense.    respiratory: Bilateral decreased breath sounds at bases with scattered crackles CVS: S1-S2 heard; rate controlled currently abdominal: Soft, nontender, distended mildly; no organomegaly; bowel sounds heard normally  extremities: No cyanosis; mild lower extremity edema present   Data Reviewed: I have personally reviewed following labs and imaging studies  CBC: Recent Labs  Lab 01/08/22 0812 01/10/22 1128 01/11/22 0805  WBC 8.8 8.3 8.2  NEUTROABS 5.8  --   --   HGB 14.7 13.9 13.7  HCT 44.5 41.7 42.1  MCV 90.1 89.5 91.7  PLT 295 284 542    Basic Metabolic Panel: Recent Labs  Lab 01/08/22 0812 01/10/22 1128 01/11/22 0805  NA 138 139 136  K 3.6 3.9 3.8  CL 105 105 103  CO2 '23 25 23  '$ GLUCOSE 108* 157* 174*  BUN '14 22 21  '$ CREATININE 1.02* 1.06* 1.23*  CALCIUM 9.7 9.7 9.5    GFR: Estimated Creatinine Clearance: 41.4 mL/min (A) (by C-G formula based on SCr of 1.23 mg/dL (H)). Liver Function Tests: Recent Labs  Lab 01/10/22 1128 01/11/22 0805  AST 18 22  ALT 16 16  ALKPHOS 64 62  BILITOT 1.0 1.1  PROT 6.4* 6.4*  ALBUMIN 3.4* 3.5    No results for input(s): "LIPASE", "AMYLASE" in the last 168 hours. No results for input(s): "AMMONIA" in the last 168 hours. Coagulation Profile: No results for input(s): "INR", "PROTIME" in the last 168 hours. Cardiac Enzymes: No results for input(s): "CKTOTAL", "CKMB", "CKMBINDEX", "TROPONINI" in the last 168 hours. BNP (last 3 results) No results for input(s): "PROBNP" in the last 8760 hours. HbA1C: No results for input(s): "HGBA1C" in the last 72 hours. CBG: No results for input(s): "GLUCAP" in the last 168 hours. Lipid Profile: No results for input(s): "CHOL", "HDL", "LDLCALC", "TRIG", "CHOLHDL", "LDLDIRECT" in the last 72 hours. Thyroid Function Tests: No results for input(s): "TSH", "T4TOTAL", "FREET4", "T3FREE", "THYROIDAB" in the last 72 hours. Anemia Panel: No results for input(s): "VITAMINB12", "FOLATE", "FERRITIN",  "TIBC", "IRON", "RETICCTPCT" in the last 72 hours. Sepsis Labs: Recent Labs  Lab 01/08/22 7062 01/10/22 1128  LATICACIDVEN 1.6 1.4     Recent Results (from the past 240 hour(s))  Urine Culture     Status: Abnormal   Collection Time: 01/08/22  2:40 AM   Specimen: Urine, Clean Catch  Result Value Ref Range Status   Specimen Description   Final    URINE, CLEAN CATCH Performed at [facility], [place] [address], [place], [place] [address]    Special Requests   Final    NONE Performed at [facility], [place] [address], [place], [place] [address]    Culture MULTIPLE SPECIES PRESENT, SUGGEST RECOLLECTION (A)  Final   Report Status 01/09/2022 FINAL  Final  Urine Culture     Status: None   Collection Time: 01/10/22 11:15 AM   Specimen: In/Out Cath Urine  Result Value Ref Range Status   Specimen Description   Final    IN/OUT CATH URINE Performed at [facility], [place] [address] [place], [place] [address]    Special Requests   Final    NONE Performed at [facility], [place] [address], [place], [place] [address]    Culture   Final    NO GROWTH Performed at [facility], [place] [address], [place], [place] [address]    Report Status 01/11/2022 FINAL  Final         Radiology Studies: No results found.      Scheduled Meds:  levothyroxine  137 mcg Oral Q0600   metoprolol tartrate  12.5 mg Oral Q2000   QUEtiapine  100 mg Oral QHS   QUEtiapine  25 mg Oral BID   rosuvastatin  40 mg Oral Daily   senna-docusate  1 tablet Oral QHS   tamsulosin  0.4 mg Oral QHS   Continuous Infusions:  piperacillin-tazobactam (ZOSYN)  IV 3.375 g (01/13/22 0458)          Aline [place], MD [facility] 01/13/2022, 7:30 AM

## 2022-01-14 DIAGNOSIS — R4189 Other symptoms and signs involving cognitive functions and awareness: Secondary | ICD-10-CM | POA: Diagnosis not present

## 2022-01-14 LAB — URINALYSIS, ROUTINE W REFLEX MICROSCOPIC
Bilirubin Urine: NEGATIVE
Glucose, UA: NEGATIVE mg/dL
Ketones, ur: NEGATIVE mg/dL
Nitrite: NEGATIVE
Protein, ur: 30 mg/dL — AB
Specific Gravity, Urine: 1.024 (ref 1.005–1.030)
pH: 5 (ref 5.0–8.0)

## 2022-01-14 MED ORDER — SODIUM CHLORIDE 0.9 % IV SOLN: INTRAVENOUS | Status: AC

## 2022-01-14 MED ORDER — HYDROXYZINE HCL 10 MG PO TABS
10.0000 mg | ORAL_TABLET | Freq: Three times a day (TID) | ORAL | Status: DC | PRN
  Administered 2022-01-14 – 2022-01-26 (×9): 10 mg via ORAL
  Filled 2022-01-14 (×14): qty 1

## 2022-01-14 MED ORDER — ONDANSETRON HCL 4 MG/2ML IJ SOLN
4.0000 mg | Freq: Four times a day (QID) | INTRAMUSCULAR | Status: DC | PRN
  Administered 2022-01-14 – 2022-01-17 (×3): 4 mg via INTRAVENOUS
  Filled 2022-01-14 (×3): qty 2

## 2022-01-14 MED ORDER — QUETIAPINE FUMARATE 25 MG PO TABS
25.0000 mg | ORAL_TABLET | Freq: Once | ORAL | Status: AC
  Administered 2022-01-14: 25 mg via ORAL
  Filled 2022-01-14: qty 1

## 2022-01-14 MED ORDER — PANTOPRAZOLE SODIUM 20 MG PO TBEC
20.0000 mg | DELAYED_RELEASE_TABLET | Freq: Every day | ORAL | Status: DC
  Administered 2022-01-14 – 2022-01-28 (×15): 20 mg via ORAL
  Filled 2022-01-14 (×15): qty 1

## 2022-01-14 NOTE — Progress Notes (Signed)
PROGRESS NOTE    Julyssa Kyer  FWY:[id number] DOB: [birth year] DOA: 12/29/2021 PCP: [name], MD   Brief Narrative:   77 y.o. female with medical history significant of CAD, pseudo-dementia, depression/anxiety, DM2, HLD, HTN, hypothyroidism, GERD who was in the ED psych hold for 12-days due to altered mental status with visual/auditory hallucinations and was evaluated by psych and found to not qualify for inpatient admission and TOC was working on facility placement.  She was having intermittent fevers in the ED not improving with oral Cipro which was started for possible UTI.  She was subsequently admitted and started on IV Zosyn.  Psychiatry was consulted.  Assessment & Plan:   UTI: Present on admission -Cultures negative so far.  Currently on Zosyn.  Afebrile for the last 48 hours.  Patient was on Cipro for few days prior to that.  DC antibiotics and monitor.    1/3: She c/o ab pain, dysuria again, cr uptrend, check bladder scan, reorder ua and urine culture Poor oral intake ,start hydration   Epigastric pain? 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  Continue Seroquel along with trazodone nightly as needed for sleep.  Prozac/hydroxyzine/Geodon discontinued by psychiatry. -Fall precautions.  Delirium precautions.    Chronic pain pain for 7 months per husband Mri thoracic and lumber spine from 12/18 reviewed  "Chronic appearing compression fracture of L3 with approximately 40% vertebral body height loss anteriorly.  there is no high-grade spinal canal stenosis in the thoracic or lumbar spine. Moderate bilateral neural foraminal narrowing at T1-T2"  Hypertension -Monitor blood  pressure.  Continue metoprolol  Hypothyroidism -Continue levothyroxine  Hyperlipidemia -Continue statin  Diabetes mellitus type 2 -Last A1c 5.7.  Blood sugars currently stable.  Carb modified diet  OSA -Continue CPAP nightly  Chronic kidney disease stage IIIa -Creatinine currently stable.  Monitor intermittently.  Goals of care -Palliative care consultation appreciated.  Patient remains full code.  Physical deconditioning -PT eval pending.  TOC following for SNF placement. Currently medically stable for discharge to SNF   DVT prophylaxis: Start Lovenox Code Status: Full Family Communication: spoke to husband at bedside, husband can not provide adequate care for patient at home, currently husband and patient is agreeing to go to rehab facility Disposition Plan: Status is: Observation Unsafe disposition Poor oral intake, hallucination, anxiety,    Consultants:  psychiatry and palliative care  Procedures: None  Antimicrobials:  Anti-infectives (From admission, onward)    Start     Dose/Rate Route Frequency Ordered Stop   01/10/22 2000  piperacillin-tazobactam (ZOSYN) IVPB 3.375 g  Status:  Discontinued        3.375 g 12.5 mL/hr over 240 Minutes Intravenous Every 8 hours 01/10/22 1405 01/13/22 0749   01/10/22 1415  piperacillin-tazobactam (ZOSYN) IVPB 3.375 g        3.375 g 100 mL/hr over 30 Minutes Intravenous  Once 01/10/22 1408 01/10/22 1538   01/10/22 1330  ciprofloxacin (CIPRO) IVPB 400 mg  Status:  Discontinued        400 mg 200 mL/hr over 60 Minutes Intravenous  Once 01/10/22 1327 01/10/22 1342   01/08/22 0330  ciprofloxacin (CIPRO) tablet 500 mg  Status:  Discontinued        500 mg Oral 2 times daily 01/08/22 2774  01/10/22 1405        Subjective:   She is not oriented  to time ,  she thinks she is at [facility], she thinks there is medication under the picture on the wall She reports chronic low back pain, and left leg pain, left groin, epigastric pain,  but does not  Husband think her anxiety and uncontrolled pain is the major issue , husband states his psychiatry changes her meds, but it never worked, husband states her anxiety is worse in the evening  Husband states she was in the ED for two weeks prior to this admission   She has poor oral intake, reports pain and feeling nauseous   Objective: Vitals:   01/13/22 2200 01/13/22 2215 01/13/22 2300 01/14/22 0700  BP: (!) 85/63 (!) 85/63 (!) 128/47 118/80  Pulse: 85 85 76 81  Resp: '20 18  18  '$ Temp: 98.3 F (36.8 C) 98.3 F (36.8 C)  98 F (36.7 C)  TempSrc: Oral Oral  Oral  SpO2: 96% 96%  98%  Weight:      Height:        Intake/Output Summary (Last 24 hours) at 01/14/2022 1205 Last data filed at 01/14/2022 1030 Gross per 24 hour  Intake 353 ml  Output 200 ml  Net 153 ml   Filed Weights   12/29/21 0908  Weight: 87.5 kg    Examination:  General: No acute distress currently.  On room air.  Looks chronically ill and deconditioned.  Flat affect.  Labile mood, crying at times, intermittent anxiety, hallucination.   respiratory: Bilateral decreased breath sounds at bases with scattered crackles CVS: S1-S2 heard; rate controlled currently abdominal: Epigastric tenderness, soft, no guarding, no rigidity , bowel sounds heard normally  extremities: No cyanosis; mild lower extremity edema present   Data Reviewed: I have personally reviewed following labs and imaging studies  CBC: Recent Labs  Lab 01/08/22 0812 01/10/22 1128 01/11/22 0805  WBC 8.8 8.3 8.2  NEUTROABS 5.8  --   --   HGB 14.7 13.9 13.7  HCT 44.5 41.7 42.1  MCV 90.1 89.5 91.7  PLT 295 284 701   Basic Metabolic Panel: Recent Labs  Lab 01/08/22 0812 01/10/22 1128 01/11/22 0805  NA 138 139 136  K 3.6 3.9 3.8  CL 105 105 103  CO2 '23 25 23  '$ GLUCOSE 108* 157* 174*  BUN '14 22 21  '$ CREATININE 1.02* 1.06* 1.23*  CALCIUM 9.7 9.7 9.5   GFR: Estimated Creatinine Clearance: 41.4 mL/min (A) (by C-G formula based  on SCr of 1.23 mg/dL (H)). Liver Function Tests: Recent Labs  Lab 01/10/22 1128 01/11/22 0805  AST 18 22  ALT 16 16  ALKPHOS 64 62  BILITOT 1.0 1.1  PROT 6.4* 6.4*  ALBUMIN 3.4* 3.5   No results for input(s): "LIPASE", "AMYLASE" in the last 168 hours. No results for input(s): "AMMONIA" in the last 168 hours. Coagulation Profile: No results for input(s): "INR", "PROTIME" in the last 168 hours. Cardiac Enzymes: No results for input(s): "CKTOTAL", "CKMB", "CKMBINDEX", "TROPONINI" in the last 168 hours. BNP (last 3 results) No results for input(s): "PROBNP" in the last 8760 hours. HbA1C: No results for input(s): "HGBA1C" in the last 72 hours. CBG: No results for input(s): "GLUCAP" in the last 168 hours. Lipid Profile: No results for input(s): "CHOL", "HDL", "LDLCALC", "TRIG", "CHOLHDL", "LDLDIRECT" in the last 72 hours. Thyroid Function Tests: No results for input(s): "TSH", "T4TOTAL", "FREET4", "T3FREE", "THYROIDAB" in the last 72 hours. Anemia Panel: No  results for input(s): "VITAMINB12", "FOLATE", "FERRITIN", "TIBC", "IRON", "RETICCTPCT" in the last 72 hours. Sepsis Labs: Recent Labs  Lab 01/08/22 4827 01/10/22 1128  LATICACIDVEN 1.6 1.4    Recent Results (from the past 240 hour(s))  Urine Culture     Status: Abnormal   Collection Time: 01/08/22  2:40 AM   Specimen: Urine, Clean Catch  Result Value Ref Range Status   Specimen Description   Final    URINE, CLEAN CATCH Performed at [facility], [place] [address], [place], [place] [address]    Special Requests   Final    NONE Performed at [facility], [place] [address] [place], [place] [address]    Culture MULTIPLE SPECIES PRESENT, SUGGEST RECOLLECTION (A)  Final   Report Status 01/09/2022 FINAL  Final  Urine Culture     Status: None   Collection Time: 01/10/22 11:15 AM   Specimen: In/Out Cath Urine  Result Value Ref Range Status   Specimen Description   Final    IN/OUT CATH  URINE Performed at [place] [address], [place], [place] [address]    Special Requests   Final    NONE Performed at [facility], [place] [address] [place], [place] [address]    Culture   Final    NO GROWTH Performed at [facility], [place] [address] [place], [place] [address]    Report Status 01/11/2022 FINAL  Final         Radiology Studies: No results found.      Scheduled Meds:  levothyroxine  137 mcg Oral Q0600   metoprolol tartrate  12.5 mg Oral Q2000   QUEtiapine  100 mg Oral QHS   QUEtiapine  25 mg Oral BID   rosuvastatin  40 mg Oral Daily   senna-docusate  1 tablet Oral QHS   tamsulosin  0.4 mg Oral QHS   Continuous Infusions:       Florencia Reasons, MD PhD FACP [facility] 01/14/2022, 12:05 PM

## 2022-01-14 NOTE — Plan of Care (Signed)
  Problem: Clinical Measurements: Goal: Will remain free from infection Outcome: Not Progressing   Problem: Activity: Goal: Risk for activity intolerance will decrease Outcome: Not Progressing   Problem: Safety: Goal: Ability to remain free from injury will improve Outcome: Not Progressing

## 2022-01-14 NOTE — TOC Progression Note (Addendum)
Transition of Care [facility]) - Progression Note    Patient Details  Name: Danielle [name] MRN: [id number] Date of Birth: May 21, 1945  Transition of [facility]) CM/SW [place], RN Phone [phone]  01/14/2022, 1:37 PM  Clinical Narrative:    CM at bedside to update husband. CM made husband aware that patient has no bed offers. Husband states that he needs some time to think things through. Husband states that he doesn't know what he is going to do. Husband states that he will try to do the best he can. Cm asked husband isf it is ok for CM to start working on DME equipment? Husband is agreeable to DME and home health. Husband states he still needs time to process this not meaning days but a little time. CM has advised husband that CM can not promise that he will have time if MD determines that patient is medically stable for discharge but CM will arrange DME delivery and set up home health services. Husband has no preference as long as services can be provided.   1400 Previous orders have been entered for Hospital bed and referral was called to [facility] with [place]. CM called Erasmo Downer to follow up to determine the status of the hospital bed. Per Erasmo Downer she is reaching out to husband for hospital bed delivery for today.       Barriers to Discharge: Continued Medical Work up  Expected Discharge Plan and Services In-house Referral: NA Discharge Planning Services: CM Consult Post Acute Care Choice: [place] arrangements for the past 2 months: Single Family Home                   DME Agency: NA       HH Arranged: NA [place] Agency: NA         Social Determinants of Health (SDOH) Interventions [place]: No Food Insecurity (11/26/2021)  Housing: Low Risk  (11/26/2021)  Transportation Needs: No Transportation Needs (11/26/2021)  Utilities: Not At Risk (11/26/2021)  Alcohol Screen: Low Risk  (11/26/2021)  Depression  (PHQ2-9): High Risk (10/09/2021)  Tobacco Use: Low Risk  (12/29/2021)    Readmission Risk Interventions    10/31/2021    5:06 PM  Readmission Risk Prevention Plan  Transportation Screening Complete  PCP or Specialist Appt within 3-5 Days Complete  HRI or [place] Complete  Social Work Consult for [place] Planning/Counseling Complete  Palliative Care Screening Not Applicable  Medication Review Press photographer) Complete

## 2022-01-15 DIAGNOSIS — R609 Edema, unspecified: Secondary | ICD-10-CM | POA: Diagnosis not present

## 2022-01-15 DIAGNOSIS — E039 Hypothyroidism, unspecified: Secondary | ICD-10-CM | POA: Diagnosis present

## 2022-01-15 DIAGNOSIS — K219 Gastro-esophageal reflux disease without esophagitis: Secondary | ICD-10-CM | POA: Diagnosis present

## 2022-01-15 DIAGNOSIS — Z6834 Body mass index (BMI) 34.0-34.9, adult: Secondary | ICD-10-CM | POA: Diagnosis not present

## 2022-01-15 DIAGNOSIS — R4182 Altered mental status, unspecified: Secondary | ICD-10-CM | POA: Diagnosis present

## 2022-01-15 DIAGNOSIS — M549 Dorsalgia, unspecified: Secondary | ICD-10-CM | POA: Diagnosis present

## 2022-01-15 DIAGNOSIS — F0394 Unspecified dementia, unspecified severity, with anxiety: Secondary | ICD-10-CM | POA: Diagnosis present

## 2022-01-15 DIAGNOSIS — F419 Anxiety disorder, unspecified: Secondary | ICD-10-CM | POA: Diagnosis not present

## 2022-01-15 DIAGNOSIS — I10 Essential (primary) hypertension: Secondary | ICD-10-CM | POA: Diagnosis not present

## 2022-01-15 DIAGNOSIS — R442 Other hallucinations: Secondary | ICD-10-CM | POA: Diagnosis not present

## 2022-01-15 DIAGNOSIS — S32030D Wedge compression fracture of third lumbar vertebra, subsequent encounter for fracture with routine healing: Secondary | ICD-10-CM | POA: Diagnosis not present

## 2022-01-15 DIAGNOSIS — Z7401 Bed confinement status: Secondary | ICD-10-CM | POA: Diagnosis not present

## 2022-01-15 DIAGNOSIS — E669 Obesity, unspecified: Secondary | ICD-10-CM | POA: Diagnosis present

## 2022-01-15 DIAGNOSIS — F6811 Factitious disorder with predominantly psychological signs and symptoms: Secondary | ICD-10-CM | POA: Diagnosis not present

## 2022-01-15 DIAGNOSIS — I4510 Unspecified right bundle-branch block: Secondary | ICD-10-CM | POA: Diagnosis present

## 2022-01-15 DIAGNOSIS — F32A Depression, unspecified: Secondary | ICD-10-CM | POA: Diagnosis not present

## 2022-01-15 DIAGNOSIS — N1831 Chronic kidney disease, stage 3a: Secondary | ICD-10-CM | POA: Diagnosis present

## 2022-01-15 DIAGNOSIS — E1139 Type 2 diabetes mellitus with other diabetic ophthalmic complication: Secondary | ICD-10-CM | POA: Diagnosis not present

## 2022-01-15 DIAGNOSIS — N390 Urinary tract infection, site not specified: Secondary | ICD-10-CM | POA: Diagnosis present

## 2022-01-15 DIAGNOSIS — G4733 Obstructive sleep apnea (adult) (pediatric): Secondary | ICD-10-CM | POA: Diagnosis present

## 2022-01-15 DIAGNOSIS — E785 Hyperlipidemia, unspecified: Secondary | ICD-10-CM | POA: Diagnosis not present

## 2022-01-15 DIAGNOSIS — I2510 Atherosclerotic heart disease of native coronary artery without angina pectoris: Secondary | ICD-10-CM | POA: Diagnosis present

## 2022-01-15 DIAGNOSIS — F329 Major depressive disorder, single episode, unspecified: Secondary | ICD-10-CM | POA: Diagnosis present

## 2022-01-15 DIAGNOSIS — Z515 Encounter for palliative care: Secondary | ICD-10-CM | POA: Diagnosis not present

## 2022-01-15 DIAGNOSIS — R4189 Other symptoms and signs involving cognitive functions and awareness: Secondary | ICD-10-CM | POA: Diagnosis not present

## 2022-01-15 DIAGNOSIS — R296 Repeated falls: Secondary | ICD-10-CM | POA: Diagnosis not present

## 2022-01-15 DIAGNOSIS — R509 Fever, unspecified: Secondary | ICD-10-CM | POA: Diagnosis not present

## 2022-01-15 DIAGNOSIS — F02811 Dementia in other diseases classified elsewhere, unspecified severity, with agitation: Secondary | ICD-10-CM | POA: Diagnosis present

## 2022-01-15 DIAGNOSIS — E1122 Type 2 diabetes mellitus with diabetic chronic kidney disease: Secondary | ICD-10-CM | POA: Diagnosis present

## 2022-01-15 DIAGNOSIS — E7800 Pure hypercholesterolemia, unspecified: Secondary | ICD-10-CM | POA: Diagnosis present

## 2022-01-15 DIAGNOSIS — J9691 Respiratory failure, unspecified with hypoxia: Secondary | ICD-10-CM | POA: Diagnosis not present

## 2022-01-15 DIAGNOSIS — F03918 Unspecified dementia, unspecified severity, with other behavioral disturbance: Secondary | ICD-10-CM | POA: Diagnosis not present

## 2022-01-15 DIAGNOSIS — F0392 Unspecified dementia, unspecified severity, with psychotic disturbance: Secondary | ICD-10-CM | POA: Diagnosis present

## 2022-01-15 DIAGNOSIS — I129 Hypertensive chronic kidney disease with stage 1 through stage 4 chronic kidney disease, or unspecified chronic kidney disease: Secondary | ICD-10-CM | POA: Diagnosis present

## 2022-01-15 DIAGNOSIS — F418 Other specified anxiety disorders: Secondary | ICD-10-CM | POA: Diagnosis not present

## 2022-01-15 DIAGNOSIS — H409 Unspecified glaucoma: Secondary | ICD-10-CM | POA: Diagnosis not present

## 2022-01-15 DIAGNOSIS — N179 Acute kidney failure, unspecified: Secondary | ICD-10-CM | POA: Diagnosis present

## 2022-01-15 DIAGNOSIS — M8008XA Age-related osteoporosis with current pathological fracture, vertebra(e), initial encounter for fracture: Secondary | ICD-10-CM | POA: Diagnosis present

## 2022-01-15 DIAGNOSIS — M4804 Spinal stenosis, thoracic region: Secondary | ICD-10-CM | POA: Diagnosis present

## 2022-01-15 DIAGNOSIS — Z0389 Encounter for observation for other suspected diseases and conditions ruled out: Secondary | ICD-10-CM | POA: Diagnosis not present

## 2022-01-15 DIAGNOSIS — R0902 Hypoxemia: Secondary | ICD-10-CM | POA: Diagnosis present

## 2022-01-15 DIAGNOSIS — M4856XA Collapsed vertebra, not elsewhere classified, lumbar region, initial encounter for fracture: Secondary | ICD-10-CM | POA: Diagnosis not present

## 2022-01-15 DIAGNOSIS — Z9989 Dependence on other enabling machines and devices: Secondary | ICD-10-CM | POA: Diagnosis not present

## 2022-01-15 DIAGNOSIS — M6281 Muscle weakness (generalized): Secondary | ICD-10-CM | POA: Diagnosis not present

## 2022-01-15 DIAGNOSIS — F0393 Unspecified dementia, unspecified severity, with mood disturbance: Secondary | ICD-10-CM | POA: Diagnosis present

## 2022-01-15 DIAGNOSIS — E876 Hypokalemia: Secondary | ICD-10-CM | POA: Diagnosis present

## 2022-01-15 LAB — CBC WITH DIFFERENTIAL/PLATELET
Abs Immature Granulocytes: 0.03 10*3/uL (ref 0.00–0.07)
Basophils Absolute: 0.1 10*3/uL (ref 0.0–0.1)
Basophils Relative: 1 %
Eosinophils Absolute: 0.3 10*3/uL (ref 0.0–0.5)
Eosinophils Relative: 4 %
HCT: 39.4 % (ref 36.0–46.0)
Hemoglobin: 13.4 g/dL (ref 12.0–15.0)
Immature Granulocytes: 0 %
Lymphocytes Relative: 21 %
Lymphs Abs: 1.6 10*3/uL (ref 0.7–4.0)
MCH: 30.1 pg (ref 26.0–34.0)
MCHC: 34 g/dL (ref 30.0–36.0)
MCV: 88.5 fL (ref 80.0–100.0)
Monocytes Absolute: 0.8 10*3/uL (ref 0.1–1.0)
Monocytes Relative: 11 %
Neutro Abs: 4.6 10*3/uL (ref 1.7–7.7)
Neutrophils Relative %: 63 %
Platelets: 268 10*3/uL (ref 150–400)
RBC: 4.45 MIL/uL (ref 3.87–5.11)
RDW: 13.1 % (ref 11.5–15.5)
WBC: 7.4 10*3/uL (ref 4.0–10.5)
nRBC: 0 % (ref 0.0–0.2)

## 2022-01-15 LAB — URINE CULTURE: Culture: NO GROWTH

## 2022-01-15 LAB — COMPREHENSIVE METABOLIC PANEL
ALT: 15 U/L (ref 0–44)
AST: 17 U/L (ref 15–41)
Albumin: 3.4 g/dL — ABNORMAL LOW (ref 3.5–5.0)
Alkaline Phosphatase: 59 U/L (ref 38–126)
Anion gap: 10 (ref 5–15)
BUN: 8 mg/dL (ref 8–23)
CO2: 22 mmol/L (ref 22–32)
Calcium: 9.1 mg/dL (ref 8.9–10.3)
Chloride: 105 mmol/L (ref 98–111)
Creatinine, Ser: 0.84 mg/dL (ref 0.44–1.00)
GFR, Estimated: >60 mL/min (ref >60)
Glucose, Bld: 126 mg/dL — ABNORMAL HIGH (ref 70–99)
Potassium: 3 mmol/L — ABNORMAL LOW (ref 3.5–5.1)
Sodium: 137 mmol/L (ref 135–145)
Total Bilirubin: 0.7 mg/dL (ref 0.3–1.2)
Total Protein: 6.1 g/dL — ABNORMAL LOW (ref 6.5–8.1)

## 2022-01-15 LAB — MAGNESIUM: Magnesium: 2 mg/dL (ref 1.7–2.4)

## 2022-01-15 MED ORDER — POTASSIUM CHLORIDE 20 MEQ PO PACK
40.0000 meq | PACK | Freq: Once | ORAL | Status: AC
  Administered 2022-01-15: 40 meq via ORAL
  Filled 2022-01-15: qty 2

## 2022-01-15 MED ORDER — HALOPERIDOL LACTATE 5 MG/ML IJ SOLN
0.5000 mg | Freq: Four times a day (QID) | INTRAMUSCULAR | Status: DC | PRN
  Administered 2022-01-15 – 2022-01-23 (×5): 0.5 mg via INTRAVENOUS
  Filled 2022-01-15 (×5): qty 1

## 2022-01-15 MED ORDER — POTASSIUM CHLORIDE 10 MEQ/100ML IV SOLN
10.0000 meq | Freq: Once | INTRAVENOUS | Status: AC
  Administered 2022-01-15: 10 meq via INTRAVENOUS
  Filled 2022-01-15: qty 100

## 2022-01-15 MED ORDER — POTASSIUM CHLORIDE 10 MEQ/100ML IV SOLN
10.0000 meq | INTRAVENOUS | Status: AC
  Administered 2022-01-15 (×3): 10 meq via INTRAVENOUS
  Filled 2022-01-15 (×3): qty 100

## 2022-01-15 MED ORDER — SODIUM CHLORIDE 0.9 % IV SOLN
INTRAVENOUS | Status: AC
  Administered 2022-01-15: 1000 mL via INTRAVENOUS

## 2022-01-15 NOTE — Progress Notes (Signed)
Physical Therapy Treatment Patient Details Name: Danielle [name] MRN: [id number] DOB: [birth year] Today's Date: 01/15/2022   History of Present Illness 77 year old female with medical history significant for diagnosis of pseudodementia, depression, anxiety who presents to the emergency department 12/29/21 with concern for abnormal behaviors, recently in hospital for same.    PT Comments    Pt in bed AxO x 1 following repeat functional commands inconsistanly/easily distracted.  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  General transfer comment: increased time and simple commands to stay on task.  Also assisted with a toilet transfer.  Assisted with peri care due to impaired balance. General Gait Details: asissted with amb a functional distance to and from bathroom required increased time due to impaired cognition and anxiety.  Positive reinforcement and assurance.  Pt amb with walker in room with slight assist for balance and walker advancement/turns.  Assisted back to bed and positioned to comfort.   Pt will need ST Rehab at SNF to address mobility and functional decline prior to safely returning home.    Recommendations for follow up therapy are one component of a multi-disciplinary discharge planning process, led by the attending physician.  Recommendations may be updated based on patient status, additional functional criteria and insurance authorization.  Follow Up Recommendations  Skilled nursing-short term rehab (<3 hours/day) Can patient physically be transported by private vehicle: No   Assistance Recommended at Discharge Frequent or constant Supervision/Assistance  Patient can return home with the following Two people to help with walking and/or transfers;Two people to help with bathing/dressing/bathroom;Assistance with  cooking/housework;Assistance with feeding;Direct supervision/assist for medications management;Direct supervision/assist for financial management;Assist for transportation;Help with stairs or ramp for entrance   Equipment Recommendations  None recommended by PT    Recommendations for Other Services       Precautions / Restrictions Precautions Precautions: Fall Precaution Comments: Safety, floor pads.  Hx Dementia Wernicke's Type Aphasia (word salad) but following all commands/direction. Restrictions Weight Bearing Restrictions: No     Mobility  Bed Mobility Overal bed mobility: Needs Assistance Bed Mobility: Supine to Sit, Sit to Supine     Supine to sit: Min assist, Mod assist   Sit to sidelying: Mod assist General bed mobility comments: increased time and repeat simple commands to stay on task    Transfers Overall transfer level: Needs assistance Equipment used: Rolling walker (2 wheels) Transfers: Sit to/from Stand Sit to Stand: Min assist, Mod assist           General transfer comment: increased time and simple commands to stay on task.  Also assisted with a toilet transfer.  Assisted with peri care due to impaired balance.    Ambulation/Gait Ambulation/Gait assistance: Min assist Gait Distance (Feet): 24 Feet (12 feet x 2) Assistive device: Rolling walker (2 wheels) Gait Pattern/deviations: Step-to pattern, Decreased step length - right, Decreased step length - left, Decreased stride length, Decreased dorsiflexion - right, Decreased dorsiflexion - left, Shuffle, Trunk flexed Gait velocity: decreased     General Gait Details: asissted with amb a functional distance to and from bathroom required increased time due to impaired cognition and anxiety.  Positive reinforcement and assurance.  Pt amb with walker in room with slight assist fro balance and walker advancement/turns.   Stairs             Emergency planning/management officer  Modified Rankin (Stroke Patients  Only)       Balance                                            Cognition Arousal/Alertness: Awake/alert Behavior During Therapy: Restless, Anxious Overall Cognitive Status: No family/caregiver present to determine baseline cognitive functioning                                 General Comments: patient did follow simple 1 step directions        Exercises      General Comments        Pertinent Vitals/Pain Pain Assessment Pain Assessment: No/denies pain    Home Living                          Prior Function            PT Goals (current goals can now be found in the care plan section) Progress towards PT goals: Progressing toward goals    Frequency    Min 2X/week      PT Plan Current plan remains appropriate    Co-evaluation              AM-PAC PT "6 Clicks" Mobility   Outcome Measure  Help needed turning from your back to your side while in a flat bed without using bedrails?: A Lot Help needed moving from lying on your back to sitting on the side of a flat bed without using bedrails?: A Lot Help needed moving to and from a bed to a chair (including a wheelchair)?: A Lot Help needed standing up from a chair using your arms (e.g., wheelchair or bedside chair)?: A Lot Help needed to walk in hospital room?: A Lot Help needed climbing 3-5 steps with a railing? : A Lot 6 Click Score: 12    End of Session Equipment Utilized During Treatment: Gait belt Activity Tolerance: Patient tolerated treatment well Patient left: in bed;with bed alarm set;with call bell/phone within reach Nurse Communication: Mobility status PT Visit Diagnosis: Unsteadiness on feet (R26.81)     Time: 1435-1500 PT Time Calculation (min) (ACUTE ONLY): 25 min  Charges:  $Gait Training: 8-22 mins $Therapeutic Activity: 8-22 mins                     Rica Koyanagi  PTA [place] Office M-F           [phone] Weekend pager [phone]

## 2022-01-15 NOTE — Progress Notes (Addendum)
PROGRESS NOTE    Danielle [name]  RJJ:[id number] DOB: Aug 27, 1945 DOA: 12/29/2021 PCP: [name], MD   Brief Narrative:   77 y.o. female with medical history significant of CAD, pseudo-dementia, depression/anxiety, DM2, HLD, HTN, hypothyroidism, GERD who was in the ED psych hold for 12-days due to altered mental status with visual/auditory hallucinations and was evaluated by psych and found to not qualify for inpatient admission and TOC was working on facility placement.  She was having intermittent fevers in the ED not improving with oral Cipro which was started for possible UTI.  She was subsequently admitted and started on IV Zosyn.  Psychiatry was consulted.  Assessment & Plan:   UTI: Present on admission -Cultures negative so far.  Currently on Zosyn.  Afebrile for the last 48 hours.  Patient was on Cipro for few days prior to that.  DC antibiotics and monitor.    1/3: She c/o ab pain, dysuria again, cr uptrend, check bladder scan, reorder ua and urine culture, she is not a reliable historian, continue to have intermittent hallucination   oral intake remain poor , consume less than 5% of the meal, continue hydration   Epigastric pain? 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  Continue Seroquel along with trazodone nightly as needed for sleep.  Prozac/hydroxyzine/Geodon discontinued by psychiatry. -Fall precautions.  Delirium precautions. Add blood culture, though she has no fever,  no leukocytosis  Hypokalemia, replace k   Chronic pain pain for 7 months per husband Mri thoracic and lumber spine from 12/18 reviewed  "Chronic appearing compression fracture of L3 with approximately  40% vertebral body height loss anteriorly.  there is no high-grade spinal canal stenosis in the thoracic or lumbar spine. Moderate bilateral neural foraminal narrowing at T1-T2"  Will contact Dr Moshe Cipro for recommendation   Addendum discussed case with Dr Shelva Majestic PA and on call ortho Dr Stann Mainland, ortho will evaluate the patient  Hypertension -Monitor blood pressure.  Continue metoprolol  Hypothyroidism -Continue levothyroxine  Hyperlipidemia -Continue statin  Diabetes mellitus type 2 -Last A1c 5.7.  Blood sugars currently stable.  Carb modified diet  OSA -Continue CPAP nightly  Chronic kidney disease stage IIIa -Creatinine currently stable.  Monitor intermittently.  Goals of care -Palliative care consultation appreciated.  Patient remains full code.  Physical deconditioning -PT eval pending.  TOC following for SNF placement. Currently medically stable for discharge to SNF   DVT prophylaxis: Start Lovenox Code Status: Full Family Communication: spoke to husband at bedside on 1/3, husband can not provide adequate care for patient at home, currently husband and patient is agreeing to go to rehab facility  Disposition Plan:  Unsafe disposition Poor oral intake, hallucination, anxiety,    Consultants:  psychiatry and palliative care  Procedures: None  Antimicrobials:  Anti-infectives (From admission, onward)    Start     Dose/Rate Route Frequency Ordered Stop   01/10/22 2000  piperacillin-tazobactam (ZOSYN) IVPB 3.375 g  Status:  Discontinued        3.375 g 12.5 mL/hr over 240 Minutes Intravenous Every 8 hours 01/10/22 1405 01/13/22 0749   01/10/22 1415  piperacillin-tazobactam (ZOSYN) IVPB 3.375 g        3.375 g 100 mL/hr over 30 Minutes Intravenous  Once 01/10/22 1408 01/10/22 1538  01/10/22 1330  ciprofloxacin (CIPRO) IVPB 400 mg  Status:  Discontinued        400 mg 200 mL/hr over 60 Minutes Intravenous  Once 01/10/22 1327 01/10/22 1342   01/08/22 0330   ciprofloxacin (CIPRO) tablet 500 mg  Status:  Discontinued        500 mg Oral 2 times daily 01/08/22 0317 01/10/22 1405        Subjective:    She continues to have hallucination, and  has poor oral intake, she is not a reliable historian    Objective: Vitals:   01/14/22 1357 01/14/22 1957 01/14/22 2151 01/15/22 0535  BP: (!) 115/55 99/71 (!) 86/68 (!) 176/157  Pulse: 87 (!) 101 96 (!) 108  Resp:   17 17  Temp:   98.2 F (36.8 C) 98.4 F (36.9 C)  TempSrc:   Oral Oral  SpO2:   97% 97%  Weight:      Height:        Intake/Output Summary (Last 24 hours) at 01/15/2022 1028 Last data filed at 01/15/2022 9833 Gross per 24 hour  Intake 884.53 ml  Output 1750 ml  Net -865.47 ml   Filed Weights   12/29/21 0908  Weight: 87.5 kg    Examination:  General: No acute distress currently.  On room air.  Looks chronically ill and deconditioned.  Flat affect.  Labile mood, crying at times, intermittent anxiety, hallucination.   respiratory: Bilateral decreased breath sounds at bases with scattered crackles CVS: S1-S2 heard; rate controlled currently abdominal: does not appear to have Epigastric tenderness, today, ab  soft, no guarding, no rigidity , bowel sounds heard normally  extremities: No cyanosis; no edema    Data Reviewed: I have personally reviewed following labs and imaging studies  CBC: Recent Labs  Lab 01/10/22 1128 01/11/22 0805 01/15/22 0846  WBC 8.3 8.2 7.4  NEUTROABS  --   --  4.6  HGB 13.9 13.7 13.4  HCT 41.7 42.1 39.4  MCV 89.5 91.7 88.5  PLT 284 278 825   Basic Metabolic Panel: Recent Labs  Lab 01/10/22 1128 01/11/22 0805 01/15/22 0846  NA 139 136 137  K 3.9 3.8 3.0*  CL 105 103 105  CO2 '25 23 22  '$ GLUCOSE 157* 174* 126*  BUN '22 21 8  '$ CREATININE 1.06* 1.23* 0.84  CALCIUM 9.7 9.5 9.1  MG  --   --  2.0   GFR: Estimated Creatinine Clearance: 60.7 mL/min (by C-G formula based on SCr of 0.84 mg/dL). Liver Function Tests: Recent Labs  Lab  01/10/22 1128 01/11/22 0805 01/15/22 0846  AST '18 22 17  '$ ALT '16 16 15  '$ ALKPHOS 64 62 59  BILITOT 1.0 1.1 0.7  PROT 6.4* 6.4* 6.1*  ALBUMIN 3.4* 3.5 3.4*   No results for input(s): "LIPASE", "AMYLASE" in the last 168 hours. No results for input(s): "AMMONIA" in the last 168 hours. Coagulation Profile: No results for input(s): "INR", "PROTIME" in the last 168 hours. Cardiac Enzymes: No results for input(s): "CKTOTAL", "CKMB", "CKMBINDEX", "TROPONINI" in the last 168 hours. BNP (last 3 results) No results for input(s): "PROBNP" in the last 8760 hours. HbA1C: No results for input(s): "HGBA1C" in the last 72 hours. CBG: No results for input(s): "GLUCAP" in the last 168 hours. Lipid Profile: No results for input(s): "CHOL", "HDL", "LDLCALC", "TRIG", "CHOLHDL", "LDLDIRECT" in the last 72 hours. Thyroid Function Tests: No results for input(s): "TSH", "T4TOTAL", "FREET4", "T3FREE", "THYROIDAB" in the last 72 hours. Anemia Panel: No results for  input(s): "VITAMINB12", "FOLATE", "FERRITIN", "TIBC", "IRON", "RETICCTPCT" in the last 72 hours. Sepsis Labs: Recent Labs  Lab 01/10/22 1128  LATICACIDVEN 1.4    Recent Results (from the past 240 hour(s))  Urine Culture     Status: Abnormal   Collection Time: 01/08/22  2:40 AM   Specimen: Urine, Clean Catch  Result Value Ref Range Status   Specimen Description   Final    URINE, CLEAN CATCH Performed at [facility], [place] [address] [place], [place] [address]    Special Requests   Final    NONE Performed at [facility], [place] [address] [place], [place] [address]    Culture MULTIPLE SPECIES PRESENT, SUGGEST RECOLLECTION (A)  Final   Report Status 01/09/2022 FINAL  Final  Urine Culture     Status: None   Collection Time: 01/10/22 11:15 AM   Specimen: In/Out Cath Urine  Result Value Ref Range Status   Specimen Description   Final    IN/OUT CATH URINE Performed at [facility] [address] [place], [place] [address]    Special Requests   Final    NONE Performed at [facility], [place] [address] [place], [place] [address]    Culture   Final    NO GROWTH Performed at [facility], [place] [address], [place], [place] [address]    Report Status 01/11/2022 FINAL  Final         Radiology Studies: No results found.      Scheduled Meds:  levothyroxine  137 mcg Oral Q0600   pantoprazole  20 mg Oral Daily   potassium chloride  40 mEq Oral Once   QUEtiapine  100 mg Oral QHS   QUEtiapine  25 mg Oral BID   rosuvastatin  40 mg Oral Daily   senna-docusate  1 tablet Oral QHS   tamsulosin  0.4 mg Oral QHS   Continuous Infusions:  sodium chloride 75 mL/hr at 01/15/22 0539   potassium chloride          Florencia Reasons, MD PhD FACP [facility] 01/15/2022, 10:28 AM

## 2022-01-15 NOTE — Progress Notes (Signed)
Case discussed with Dr. Erlinda Hong of the hospitalist service.  At this time we would recommend a MRI of the lumbar spine without contrast.  I reviewed our medical record over in the office which did reflect that she has an L3 compression fracture that was considered for kyphoplasty.  Dr. Melina Schools of [facility] spine surgery service will be by tomorrow after lunchtime between 1 PM and 2 PM to assess the patient and review the MRI hopefully.  Would appreciate if she has a family member there at that time if able.

## 2022-01-15 NOTE — Progress Notes (Signed)
  Daily Progress Note   Patient Name: Danielle [name]       Date: 01/15/2022 DOB: October 23, 1945  Age: 77 y.o. MRN#: [id number] Attending Physician: Florencia Reasons, MD Primary Care Physician: [name], MD Admit Date: 12/29/2021 Length of Stay: 0 days  Discussed care with primary hospitalist today. Continuing medical care and working with therapies. PMT will continue to follow along.   Chelsea Aus, DO Palliative Care Provider PMT # [phone]

## 2022-01-16 ENCOUNTER — Encounter (HOSPITAL_COMMUNITY): Admission: EM | Disposition: A | Discharge status: Skilled Nursing Facility | Payer: Self-pay | Source: Home / Self Care

## 2022-01-16 ENCOUNTER — Inpatient Hospital Stay (HOSPITAL_COMMUNITY): Payer: Medicare HMO | Admitting: Anesthesiology

## 2022-01-16 ENCOUNTER — Other Ambulatory Visit: Payer: Self-pay

## 2022-01-16 ENCOUNTER — Inpatient Hospital Stay (HOSPITAL_COMMUNITY): Payer: Medicare HMO

## 2022-01-16 ENCOUNTER — Encounter (HOSPITAL_COMMUNITY): Payer: Self-pay | Admitting: Internal Medicine

## 2022-01-16 DIAGNOSIS — N390 Urinary tract infection, site not specified: Secondary | ICD-10-CM | POA: Diagnosis not present

## 2022-01-16 DIAGNOSIS — J9691 Respiratory failure, unspecified with hypoxia: Secondary | ICD-10-CM | POA: Diagnosis not present

## 2022-01-16 DIAGNOSIS — S32000A Wedge compression fracture of unspecified lumbar vertebra, initial encounter for closed fracture: Secondary | ICD-10-CM | POA: Diagnosis present

## 2022-01-16 DIAGNOSIS — G4733 Obstructive sleep apnea (adult) (pediatric): Secondary | ICD-10-CM

## 2022-01-16 DIAGNOSIS — M8008XA Age-related osteoporosis with current pathological fracture, vertebra(e), initial encounter for fracture: Secondary | ICD-10-CM | POA: Diagnosis not present

## 2022-01-16 DIAGNOSIS — F6811 Factitious disorder with predominantly psychological signs and symptoms: Secondary | ICD-10-CM | POA: Diagnosis not present

## 2022-01-16 DIAGNOSIS — R4189 Other symptoms and signs involving cognitive functions and awareness: Secondary | ICD-10-CM | POA: Diagnosis not present

## 2022-01-16 DIAGNOSIS — I10 Essential (primary) hypertension: Secondary | ICD-10-CM

## 2022-01-16 DIAGNOSIS — Z9989 Dependence on other enabling machines and devices: Secondary | ICD-10-CM

## 2022-01-16 DIAGNOSIS — Z0389 Encounter for observation for other suspected diseases and conditions ruled out: Secondary | ICD-10-CM | POA: Diagnosis not present

## 2022-01-16 HISTORY — PX: KYPHOPLASTY: SHX5884

## 2022-01-16 LAB — GLUCOSE, CAPILLARY
Glucose-Capillary: 97 mg/dL (ref 70–99)
Glucose-Capillary: 109 mg/dL — ABNORMAL HIGH (ref 70–99)

## 2022-01-16 LAB — SURGICAL PCR SCREEN
MRSA, PCR: NEGATIVE
Staphylococcus aureus: NEGATIVE

## 2022-01-16 SURGERY — KYPHOPLASTY
Anesthesia: General | Site: Spine Lumbar

## 2022-01-16 MED ORDER — LACTATED RINGERS IV SOLN: INTRAVENOUS | Status: DC

## 2022-01-16 MED ORDER — DEXMEDETOMIDINE HCL IN NACL 80 MCG/20ML IV SOLN
INTRAVENOUS | Status: DC | PRN
  Administered 2022-01-16: 16 ug via BUCCAL

## 2022-01-16 MED ORDER — SODIUM CHLORIDE 0.9 % IV SOLN: 250.0000 mL | INTRAVENOUS | Status: DC

## 2022-01-16 MED ORDER — PROPOFOL 500 MG/50ML IV EMUL
INTRAVENOUS | Status: AC
  Filled 2022-01-16: qty 50

## 2022-01-16 MED ORDER — 0.9 % SODIUM CHLORIDE (POUR BTL) OPTIME
TOPICAL | Status: DC | PRN
  Administered 2022-01-16: 1000 mL

## 2022-01-16 MED ORDER — ACETAMINOPHEN 325 MG PO TABS
650.0000 mg | ORAL_TABLET | ORAL | Status: DC | PRN
  Administered 2022-01-17 – 2022-01-25 (×7): 650 mg via ORAL
  Filled 2022-01-16 (×2): qty 2

## 2022-01-16 MED ORDER — PROPOFOL 10 MG/ML IV BOLUS
INTRAVENOUS | Status: AC
  Filled 2022-01-16: qty 20

## 2022-01-16 MED ORDER — ACETAMINOPHEN 650 MG RE SUPP: 650.0000 mg | RECTAL | Status: DC | PRN

## 2022-01-16 MED ORDER — POLYETHYLENE GLYCOL 3350 17 G PO PACK: 17.0000 g | PACK | Freq: Every day | ORAL | Status: DC | PRN

## 2022-01-16 MED ORDER — ONDANSETRON HCL 4 MG/2ML IJ SOLN
INTRAMUSCULAR | Status: AC
  Filled 2022-01-16: qty 2

## 2022-01-16 MED ORDER — CHLORHEXIDINE GLUCONATE 0.12 % MT SOLN
15.0000 mL | Freq: Once | OROMUCOSAL | Status: AC
  Administered 2022-01-16: 15 mL via OROMUCOSAL

## 2022-01-16 MED ORDER — ACETAMINOPHEN 10 MG/ML IV SOLN: 1000.0000 mg | Freq: Once | INTRAVENOUS | Status: DC | PRN

## 2022-01-16 MED ORDER — PROPOFOL 500 MG/50ML IV EMUL
INTRAVENOUS | Status: DC | PRN
  Administered 2022-01-16: 100 ug/kg/min via INTRAVENOUS

## 2022-01-16 MED ORDER — FENTANYL CITRATE (PF) 100 MCG/2ML IJ SOLN
INTRAMUSCULAR | Status: AC
  Filled 2022-01-16: qty 2

## 2022-01-16 MED ORDER — SUGAMMADEX SODIUM 200 MG/2ML IV SOLN
INTRAVENOUS | Status: DC | PRN
  Administered 2022-01-16: 200 mg via INTRAVENOUS

## 2022-01-16 MED ORDER — BUPIVACAINE HCL (PF) 0.5 % IJ SOLN
INTRAMUSCULAR | Status: AC
  Filled 2022-01-16: qty 30

## 2022-01-16 MED ORDER — PROPOFOL 10 MG/ML IV BOLUS
INTRAVENOUS | Status: DC | PRN
  Administered 2022-01-16: 110 mg via INTRAVENOUS

## 2022-01-16 MED ORDER — PHENYLEPHRINE HCL (PRESSORS) 10 MG/ML IV SOLN
INTRAVENOUS | Status: AC
  Filled 2022-01-16: qty 1

## 2022-01-16 MED ORDER — PROPOFOL 1000 MG/100ML IV EMUL
INTRAVENOUS | Status: AC
  Filled 2022-01-16: qty 100

## 2022-01-16 MED ORDER — METHOCARBAMOL 1000 MG/10ML IJ SOLN: 500.0000 mg | Freq: Four times a day (QID) | INTRAVENOUS | Status: DC | PRN

## 2022-01-16 MED ORDER — VANCOMYCIN HCL IN DEXTROSE 1-5 GM/200ML-% IV SOLN
INTRAVENOUS | Status: AC
  Filled 2022-01-16: qty 200

## 2022-01-16 MED ORDER — FENTANYL CITRATE PF 50 MCG/ML IJ SOSY
PREFILLED_SYRINGE | INTRAMUSCULAR | Status: AC
  Administered 2022-01-16: 25 ug via INTRAVENOUS
  Filled 2022-01-16: qty 2

## 2022-01-16 MED ORDER — ACETAMINOPHEN 500 MG PO TABS
1000.0000 mg | ORAL_TABLET | Freq: Once | ORAL | Status: AC
  Administered 2022-01-16: 1000 mg via ORAL
  Filled 2022-01-16: qty 2

## 2022-01-16 MED ORDER — MAGNESIUM CITRATE PO SOLN: 1.0000 | Freq: Once | ORAL | Status: DC | PRN

## 2022-01-16 MED ORDER — VANCOMYCIN HCL 1000 MG IV SOLR
INTRAVENOUS | Status: DC | PRN
  Administered 2022-01-16: 1000 mg via INTRAVENOUS

## 2022-01-16 MED ORDER — LIDOCAINE 2% (20 MG/ML) 5 ML SYRINGE
INTRAMUSCULAR | Status: DC | PRN
  Administered 2022-01-16: 80 mg via INTRAVENOUS

## 2022-01-16 MED ORDER — ONDANSETRON HCL 4 MG/2ML IJ SOLN: 4.0000 mg | Freq: Once | INTRAMUSCULAR | Status: DC | PRN

## 2022-01-16 MED ORDER — DEXAMETHASONE SODIUM PHOSPHATE 10 MG/ML IJ SOLN
INTRAMUSCULAR | Status: DC | PRN
  Administered 2022-01-16: 4 mg via INTRAVENOUS

## 2022-01-16 MED ORDER — SODIUM CHLORIDE 0.9% FLUSH
3.0000 mL | Freq: Two times a day (BID) | INTRAVENOUS | Status: DC
  Administered 2022-01-16 – 2022-01-17 (×2): 3 mL via INTRAVENOUS

## 2022-01-16 MED ORDER — LIDOCAINE HCL (PF) 2 % IJ SOLN
INTRAMUSCULAR | Status: AC
  Filled 2022-01-16: qty 5

## 2022-01-16 MED ORDER — BUPIVACAINE LIPOSOME 1.3 % IJ SUSP
INTRAMUSCULAR | Status: AC
  Filled 2022-01-16: qty 20

## 2022-01-16 MED ORDER — MENTHOL 3 MG MT LOZG: 1.0000 | LOZENGE | OROMUCOSAL | Status: DC | PRN

## 2022-01-16 MED ORDER — ORAL CARE MOUTH RINSE: 15.0000 mL | Freq: Once | OROMUCOSAL | Status: AC

## 2022-01-16 MED ORDER — ONDANSETRON HCL 4 MG/2ML IJ SOLN: 4.0000 mg | Freq: Four times a day (QID) | INTRAMUSCULAR | Status: DC | PRN

## 2022-01-16 MED ORDER — PHENYLEPHRINE HCL-NACL 20-0.9 MG/250ML-% IV SOLN
INTRAVENOUS | Status: DC | PRN
  Administered 2022-01-16: 40 ug/min via INTRAVENOUS

## 2022-01-16 MED ORDER — FENTANYL CITRATE (PF) 100 MCG/2ML IJ SOLN
INTRAMUSCULAR | Status: DC | PRN
  Administered 2022-01-16: 50 ug via INTRAVENOUS

## 2022-01-16 MED ORDER — ONDANSETRON HCL 4 MG/2ML IJ SOLN
INTRAMUSCULAR | Status: DC | PRN
  Administered 2022-01-16: 4 mg via INTRAVENOUS

## 2022-01-16 MED ORDER — FENTANYL CITRATE PF 50 MCG/ML IJ SOSY
25.0000 ug | PREFILLED_SYRINGE | INTRAMUSCULAR | Status: DC | PRN
  Administered 2022-01-16: 25 ug via INTRAVENOUS
  Administered 2022-01-16: 50 ug via INTRAVENOUS
  Administered 2022-01-16: 25 ug via INTRAVENOUS

## 2022-01-16 MED ORDER — SODIUM CHLORIDE 0.9% FLUSH: 3.0000 mL | INTRAVENOUS | Status: DC | PRN

## 2022-01-16 MED ORDER — METHOCARBAMOL 500 MG PO TABS
500.0000 mg | ORAL_TABLET | Freq: Four times a day (QID) | ORAL | Status: DC | PRN
  Administered 2022-01-16 – 2022-01-19 (×3): 500 mg via ORAL
  Filled 2022-01-16 (×3): qty 1

## 2022-01-16 MED ORDER — PHENOL 1.4 % MT LIQD: 1.0000 | OROMUCOSAL | Status: DC | PRN

## 2022-01-16 MED ORDER — VANCOMYCIN HCL IN DEXTROSE 1-5 GM/200ML-% IV SOLN
1000.0000 mg | Freq: Two times a day (BID) | INTRAVENOUS | Status: AC
  Administered 2022-01-17: 1000 mg via INTRAVENOUS
  Filled 2022-01-16: qty 200

## 2022-01-16 MED ORDER — ROCURONIUM BROMIDE 10 MG/ML (PF) SYRINGE
PREFILLED_SYRINGE | INTRAVENOUS | Status: DC | PRN
  Administered 2022-01-16: 45 mg via INTRAVENOUS

## 2022-01-16 MED ORDER — HYDROCODONE-ACETAMINOPHEN 5-325 MG PO TABS
2.0000 | ORAL_TABLET | ORAL | Status: DC | PRN
  Administered 2022-01-16 – 2022-01-28 (×12): 2 via ORAL
  Filled 2022-01-16 (×12): qty 2

## 2022-01-16 MED ORDER — DEXAMETHASONE SODIUM PHOSPHATE 10 MG/ML IJ SOLN
INTRAMUSCULAR | Status: AC
  Filled 2022-01-16: qty 1

## 2022-01-16 MED ORDER — MUPIROCIN 2 % EX OINT
1.0000 | TOPICAL_OINTMENT | Freq: Two times a day (BID) | CUTANEOUS | Status: AC
  Administered 2022-01-16 – 2022-01-20 (×9): 1 via NASAL
  Filled 2022-01-16 (×2): qty 22

## 2022-01-16 MED ORDER — IOHEXOL 300 MG/ML  SOLN
INTRAMUSCULAR | Status: DC | PRN
  Administered 2022-01-16: 50 mL

## 2022-01-16 MED ORDER — ONDANSETRON HCL 4 MG PO TABS: 4.0000 mg | ORAL_TABLET | Freq: Four times a day (QID) | ORAL | Status: DC | PRN

## 2022-01-16 MED ORDER — BUPIVACAINE HCL 0.25 % IJ SOLN
INTRAMUSCULAR | Status: AC
  Filled 2022-01-16: qty 1

## 2022-01-16 SURGICAL SUPPLY — 58 items
ADH SKN CLS APL DERMABOND .7 (GAUZE/BANDAGES/DRESSINGS) ×1
APL PRP STRL LF DISP 70% ISPRP (MISCELLANEOUS) ×1
BAG COUNTER SPONGE SURGICOUNT (BAG) IMPLANT
BAG SPNG CNTER NS LX DISP (BAG)
BLADE SURG 15 STRL LF DISP TIS (BLADE) ×1 IMPLANT
BLADE SURG 15 STRL SS (BLADE) ×1
BNDG ADH 1X3 SHEER STRL LF (GAUZE/BANDAGES/DRESSINGS) IMPLANT
BNDG ADH THN 3X1 STRL LF (GAUZE/BANDAGES/DRESSINGS) ×2
BONE FILLER DEVICE STRL SZ3 (INSTRUMENTS) IMPLANT
CEMENT KYPHON C01A KIT/MIXER (Cement) ×1 IMPLANT
CHLORAPREP W/TINT 26 (MISCELLANEOUS) ×1 IMPLANT
COVER BACK TABLE 60X90IN (DRAPES) ×1 IMPLANT
COVER MAYO STAND STRL (DRAPES) ×1 IMPLANT
COVER SURGICAL LIGHT HANDLE (MISCELLANEOUS) ×1 IMPLANT
CURETTE EXPRESS SZ2 7MM (INSTRUMENTS) IMPLANT
CURETTE WEDGE 8.5MM KYPHX (MISCELLANEOUS) IMPLANT
CURRETTE EXPRESS SZ2 7MM (INSTRUMENTS) ×1
DERMABOND ADVANCED .7 DNX12 (GAUZE/BANDAGES/DRESSINGS) ×1 IMPLANT
DRAPE C-ARM 42X120 X-RAY (DRAPES) ×1 IMPLANT
DRAPE INCISE IOBAN 66X45 STRL (DRAPES) ×1 IMPLANT
DRAPE LAPAROTOMY T 102X78X121 (DRAPES) ×1 IMPLANT
DRAPE SHEET LG 3/4 BI-LAMINATE (DRAPES) ×2 IMPLANT
DRAPE U-SHAPE 47X51 STRL (DRAPES) ×2 IMPLANT
DRSG MEPILEX POST OP 4X8 (GAUZE/BANDAGES/DRESSINGS) ×1 IMPLANT
GAUZE 4X4 16PLY ~~LOC~~+RFID DBL (SPONGE) ×1 IMPLANT
GAUZE SPONGE 2X2 8PLY STRL LF (GAUZE/BANDAGES/DRESSINGS) IMPLANT
GLOVE BIOGEL PI IND STRL 6.5 (GLOVE) IMPLANT
GLOVE ECLIPSE 8.5 STRL (GLOVE) ×2 IMPLANT
GOWN STRL REUS W/ TWL LRG LVL3 (GOWN DISPOSABLE) IMPLANT
GOWN STRL REUS W/ TWL XL LVL3 (GOWN DISPOSABLE) ×1 IMPLANT
GOWN STRL REUS W/TWL LRG LVL3 (GOWN DISPOSABLE)
GOWN STRL REUS W/TWL XL LVL3 (GOWN DISPOSABLE) ×1
KIT BASIN OR (CUSTOM PROCEDURE TRAY) ×1 IMPLANT
MANIFOLD NEPTUNE II (INSTRUMENTS) ×1 IMPLANT
NDL HYPO 25X1 1.5 SAFETY (NEEDLE) ×1 IMPLANT
NDL SPNL 22GX3.5 QUINCKE BK (NEEDLE) IMPLANT
NDL SPNL 22GX7 QUINCKE BK (NEEDLE) ×2 IMPLANT
NEEDLE HYPO 25X1 1.5 SAFETY (NEEDLE) ×1 IMPLANT
NEEDLE SPNL 22GX3.5 QUINCKE BK (NEEDLE) ×1 IMPLANT
NEEDLE SPNL 22GX7 QUINCKE BK (NEEDLE) ×2 IMPLANT
NS IRRIG 1000ML POUR BTL (IV SOLUTION) ×1 IMPLANT
PACK UNIVERSAL I (CUSTOM PROCEDURE TRAY) ×1 IMPLANT
PENCIL SMOKE EVACUATOR (MISCELLANEOUS) IMPLANT
PROTECTOR NERVE ULNAR (MISCELLANEOUS) ×1 IMPLANT
SUT BONE WAX W31G (SUTURE) ×1 IMPLANT
SUT MNCRL AB 4-0 PS2 18 (SUTURE) ×1 IMPLANT
SUT VIC AB 1 CT1 27 (SUTURE) ×2
SUT VIC AB 1 CT1 27XBRD ANTBC (SUTURE) ×2 IMPLANT
SUT VIC AB 2-0 CT1 18 (SUTURE) ×1 IMPLANT
SUT VIC AB 2-0 CT1 27 (SUTURE)
SUT VIC AB 2-0 CT1 TAPERPNT 27 (SUTURE) IMPLANT
SUT VIC AB 3-0 X1 27 (SUTURE) IMPLANT
SUT VICRYL 0 UR6 27IN ABS (SUTURE) IMPLANT
SYR CONTROL 10ML LL (SYRINGE) ×1 IMPLANT
TOWEL OR 17X26 10 PK STRL BLUE (TOWEL DISPOSABLE) ×2 IMPLANT
TRAY KYPHOPAK 15/3 ONESTEP 1ST (MISCELLANEOUS) IMPLANT
TRAY KYPHOPAK 20/3 ONESTEP 1ST (MISCELLANEOUS) ×1 IMPLANT
WATER STERILE IRR 1000ML POUR (IV SOLUTION) ×1 IMPLANT

## 2022-01-16 NOTE — Transfer of Care (Signed)
Immediate Anesthesia Transfer of Care Note  Patient: Danielle [name]  Procedure(s) Performed: L-3 COMPRESSION FRACTURE WITH KYPHOPLASTY (Spine Lumbar)  Patient Location: PACU  Anesthesia Type:General  Level of Consciousness: drowsy and patient cooperative  Airway & Oxygen Therapy: Patient Spontanous Breathing and Patient connected to face mask oxygen  Post-op Assessment: Report given to RN and Post -op Vital signs reviewed and stable  Post vital signs: Reviewed and stable  Last Vitals:  Vitals Value Taken Time  BP 120/57 01/16/22 1807  Temp    Pulse 90 01/16/22 1810  Resp 20 01/16/22 1810  SpO2 100 % 01/16/22 1810  Vitals shown include unvalidated device data.  Last Pain:  Vitals:   01/16/22 1453  TempSrc:   PainSc: 8       Patients Stated Pain Goal: 3 (30/07/62 2633)  Complications: No notable events documented.

## 2022-01-16 NOTE — Consult Note (Signed)
Chief Complaint: L3 compression fracture  History: Danielle [name] is a very pleasant 77 year old woman who has been under the care of my partner Dr. Tonita Cong for an L3 osteoporotic compression fracture.  The patient has had progressive back pain which is led her to the emergency room.  She was initially seen on 12/29/2021.  The patient has remained in the emergency room with significant pain and difficulty with mobilization.  Patient to the pain she was also evaluated for psychiatric issues.  Patient was ultimately cleared and was awaiting placement.  Her pain became significantly worse.  She states she has not been out of bed for at least 2 weeks.  And remained in the emergency room and was ultimately admitted to the floor because of a UTI and hypoxia.  Orthopedic consultation was requested for further treatment and evaluation of her L3 compression fracture.  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  Recent UTI but no incontinence of bowel or bladder.  No shortness of breath or chest pain.  No recent loss of consciousness. Past Medical History:  Diagnosis Date   Arthritis 02/13/2011   osteoarthritis, hips, knees,s/p Cervical fusion(DDD)   Atherosclerosis of aorta ([place]) 05/30/2015   on CTA chest   CAD (coronary artery disease)    a. 09/2016: Coronary CT showing mid-LAD plaque with 20-50% associated stenosis but no significant stenosis by FFR analysis.    Dementia ([place])    Depression with anxiety    Diabetes mellitus without complication ([place])    Difficult intubation 02/13/2011   with surgery -multiple times, no problems with recent surgeries   GERD (gastroesophageal reflux disease) 02/13/2011   tx. Omeprazole   Glaucoma 02/13/2011   bil. tx. eye drops daily   Headache(784.0) 02/13/2011   past hx. migraines, none recent   Heart murmur 02/13/2011   was told-no issues with this   Hypercholesterolemia    Hypertension 02/13/2011   Hypothyroidism 02/13/2011   tx.  with Levothyroxine   Major depressive disorder    Mitral regurgitation 06/01/2015   Mild   Sinus drainage 02/13/2011   uses Zyrtec as needed   Sleep apnea 02/13/2011   Hx. sleep apnea-uses cpap since 10'12 nightly    Allergies  Allergen Reactions   Ceftin [Cefuroxime Axetil] Hives, Swelling and Other (See Comments)    Swelling of face   Codeine Nausea Only   Compazine [Prochlorperazine] Swelling and Other (See Comments)    Face and tongue swelling    No current facility-administered medications on file prior to encounter.   Current Outpatient Medications on File Prior to Encounter  Medication Sig Dispense Refill   bisacodyl (DULCOLAX) 5 MG EC tablet Take 5 mg by mouth daily as needed for mild constipation or moderate constipation.     CALCIUM PO Take 1 tablet by mouth every 7 (seven) days.     cariprazine (VRAYLAR) 1.5 MG capsule Take 1 capsule (1.5 mg total) by mouth daily. 30 capsule 1   FLUoxetine (PROZAC) 40 MG capsule Take 1 capsule (40 mg total) by mouth daily. 30 capsule 1   HYDROcodone-acetaminophen (NORCO/VICODIN) 5-325 MG tablet Take 1 tablet by mouth every 6 (six) hours as needed for moderate pain.     hydrOXYzine (ATARAX) 25 MG tablet Take 1 tablet (25 mg total) by mouth every 6 (six) hours as needed for anxiety. (Patient taking differently: Take 25 mg by mouth See admin instructions. Take 25 mg by mouth in the evening as needed for anxiety) 30 tablet  2   ibuprofen (ADVIL) 800 MG tablet Take 800 mg by mouth every 8 (eight) hours as needed (for pain).     levothyroxine (SYNTHROID, LEVOTHROID) 137 MCG tablet Take 137 mcg by mouth daily before breakfast.     Magnesium 100 MG CAPS Take 100 mg by mouth every 7 (seven) days.     metoprolol tartrate (LOPRESSOR) 25 MG tablet Take 0.5 tablets (12.5 mg total) by mouth daily at 8 pm. (Patient taking differently: Take 12.5 mg by mouth in the morning and at bedtime.) 30 tablet 3   nitroGLYCERIN (NITROSTAT) 0.4 MG SL tablet Place 0.4  mg under the tongue every 5 (five) minutes as needed for chest pain.     ondansetron (ZOFRAN) 4 MG tablet Take 1 tablet (4 mg total) by mouth every 8 (eight) hours as needed for nausea or vomiting. 30 tablet 0   QUEtiapine (SEROQUEL) 100 MG tablet Take 1 tablet (100 mg total) by mouth at bedtime. 30 tablet 3   rosuvastatin (CRESTOR) 40 MG tablet Take 1 tablet (40 mg total) by mouth daily. 90 tablet 3   traZODone (DESYREL) 100 MG tablet Take 1 tablet (100 mg total) by mouth at bedtime as needed for sleep. (Patient taking differently: Take 100 mg by mouth at bedtime.) 30 tablet 3   Vitamin D, Ergocalciferol, (DRISDOL) 1.25 MG (50000 UNIT) CAPS capsule Take 50,000 Units by mouth every Wednesday.     senna-docusate (SENOKOT-S) 8.6-50 MG tablet Take 1 tablet by mouth at bedtime. (Patient not taking: Reported on 12/30/2021)      Physical Exam: Vitals:   01/15/22 2106 01/16/22 0624  BP: (!) 132/58 122/64  Pulse: (!) 105 80  Resp: 16 14  Temp: 98.4 F (36.9 C)   SpO2: 96% 99%   Body mass index is 34.19 kg/m. General: On room air.  No distress.  Looks chronically ill and deconditioned.  Flat affect.  Slow to respond.  Speaks in sentences which do not make sense.   respiratory: Decreased breath sounds at bases bilaterally with some crackles CVS: Currently rate controlled; S1-S2 heard  abdominal: Soft, nontender, slightly distended, no organomegaly; normal bowel sounds are heard  extremities: Trace lower extremity edema; no clubbing.   Neurological exam: 5/5 EHL/tibialis anterior/gastrocnemius strength.  Negative Babinski test.  Sensation light touch is intact in the lower extremity bilaterally. Musculoskeletal.  No significant pain with hip, knee, ankle range of motion.  Significant back pain in the lumbar spine region with direct palpation.  No ecchymosis or bruising is noted.  Image: MR LUMBAR SPINE WO CONTRAST  Result Date: 12/29/2021 CLINICAL DATA:  Myelopathy; psychiatric evaluation EXAM:  MRI THORACIC AND LUMBAR SPINE WITHOUT CONTRAST TECHNIQUE: Multiplanar and multiecho pulse sequences of the thoracic and lumbar spine were obtained without intravenous contrast. COMPARISON:  None Available. FINDINGS: Evaluation is limited by motion artifact. This particularly limits evaluation of the lumbar spine, as no axial sequences could be obtained, and the STIR sequence is nondiagnostic. The thoracic axial medic sequence is also nondiagnostic. MRI THORACIC SPINE FINDINGS Alignment: Mild S shaped curvature of the thoracolumbar spine. No listhesis. Vertebrae: No fracture, evidence of discitis, or bone lesion. Cord:  Normal signal and morphology. Paraspinal and other soft tissues: Negative. Disc levels: No high-grade spinal canal stenosis. Moderate bilateral neural foraminal narrowing at T1-T2. MRI LUMBAR SPINE FINDINGS Segmentation: 5 lumbar type vertebral bodies are presumed. The last fully formed disc space is labeled L5-S1. Alignment:  3 mm anterolisthesis L5 on S1. Vertebrae: No acute fracture or suspicious  osseous lesion. Chronic appearing compression fracture of L3 with approximately 40% vertebral body height loss anteriorly. T1 and T2 hyperintense foci, the largest of which is in L1, consistent with benign hemangiomas. Conus medullaris and cauda equina: Conus extends to the L1-L2 level. Evaluation of the conus and cauda equina is limited. Paraspinal and other soft tissues: Negative. Disc levels: No high-grade spinal canal stenosis. Evaluation of the neural foramina is limited by motion. IMPRESSION: 1. Evaluation is limited by motion artifact. This particularly limits evaluation of the lumbar spine, as no axial sequences could be obtained, and the STIR sequence is nondiagnostic. 2. Within this limitation, there is no high-grade spinal canal stenosis in the thoracic or lumbar spine. Moderate bilateral neural foraminal narrowing at T1-T2. The neural foramina in the lumbar spine cannot be evaluated. If there  is persistent concern for spinal pathology, consider repeating the exam when the patient is better able to tolerate it. Electronically Signed   By: Merilyn Baba M.D.   On: 12/29/2021 23:19   MR THORACIC SPINE WO CONTRAST  Result Date: 12/29/2021 CLINICAL DATA:  Myelopathy; psychiatric evaluation EXAM: MRI THORACIC AND LUMBAR SPINE WITHOUT CONTRAST TECHNIQUE: Multiplanar and multiecho pulse sequences of the thoracic and lumbar spine were obtained without intravenous contrast. COMPARISON:  None Available. FINDINGS: Evaluation is limited by motion artifact. This particularly limits evaluation of the lumbar spine, as no axial sequences could be obtained, and the STIR sequence is nondiagnostic. The thoracic axial medic sequence is also nondiagnostic. MRI THORACIC SPINE FINDINGS Alignment: Mild S shaped curvature of the thoracolumbar spine. No listhesis. Vertebrae: No fracture, evidence of discitis, or bone lesion. Cord:  Normal signal and morphology. Paraspinal and other soft tissues: Negative. Disc levels: No high-grade spinal canal stenosis. Moderate bilateral neural foraminal narrowing at T1-T2. MRI LUMBAR SPINE FINDINGS Segmentation: 5 lumbar type vertebral bodies are presumed. The last fully formed disc space is labeled L5-S1. Alignment:  3 mm anterolisthesis L5 on S1. Vertebrae: No acute fracture or suspicious osseous lesion. Chronic appearing compression fracture of L3 with approximately 40% vertebral body height loss anteriorly. T1 and T2 hyperintense foci, the largest of which is in L1, consistent with benign hemangiomas. Conus medullaris and cauda equina: Conus extends to the L1-L2 level. Evaluation of the conus and cauda equina is limited. Paraspinal and other soft tissues: Negative. Disc levels: No high-grade spinal canal stenosis. Evaluation of the neural foramina is limited by motion. IMPRESSION: 1. Evaluation is limited by motion artifact. This particularly limits evaluation of the lumbar spine, as  no axial sequences could be obtained, and the STIR sequence is nondiagnostic. 2. Within this limitation, there is no high-grade spinal canal stenosis in the thoracic or lumbar spine. Moderate bilateral neural foraminal narrowing at T1-T2. The neural foramina in the lumbar spine cannot be evaluated. If there is persistent concern for spinal pathology, consider repeating the exam when the patient is better able to tolerate it. Electronically Signed   By: Merilyn Baba M.D.   On: 12/29/2021 23:19   MR BRAIN WO CONTRAST  Result Date: 12/29/2021 CLINICAL DATA:  Altered mental status. EXAM: MRI HEAD WITHOUT CONTRAST TECHNIQUE: Multiplanar, multiecho pulse sequences of the brain and surrounding structures were obtained without intravenous contrast. COMPARISON:  Same-day CT head, brain MRI 11/23/2021. FINDINGS: Image quality is degraded by motion artifact, particularly affecting the axial T2 sequence. The exam could not be completed; axial T1 and coronal T2 images were not obtained. Brain: There is no evidence of acute intracranial hemorrhage, extra-axial fluid collection, or  acute infarct (the diffusion sequence is of diagnostic quality. Parenchymal volume is normal. The ventricles are normal in size. Gray-white differentiation is preserved. Scattered small foci of FLAIR signal abnormality in the supratentorial white matter are nonspecific, likely reflecting mild chronic small-vessel ischemic change There is no mass lesion.  There is no mass effect or midline shift. Vascular: Suboptimally assessed due to motion artifact. No definite abnormality. Skull and upper cervical spine: Normal marrow signal. Sinuses/Orbits: Grossly unremarkable. Other: None. IMPRESSION: No acute intracranial pathology. Electronically Signed   By: Valetta Mole M.D.   On: 12/29/2021 14:24   CT HEAD WO CONTRAST (5MM)  Result Date: 12/29/2021 CLINICAL DATA:  Mental status change, unknown cause. EXAM: CT HEAD WITHOUT CONTRAST TECHNIQUE:  Contiguous axial images were obtained from the base of the skull through the vertex without intravenous contrast. RADIATION DOSE REDUCTION: This exam was performed according to the departmental dose-optimization program which includes automated exposure control, adjustment of the mA and/or kV according to patient size and/or use of iterative reconstruction technique. COMPARISON:  Head CT 10/27/2021 and MRI 11/23/2021 FINDINGS: Brain: There is no evidence of an acute infarct, intracranial hemorrhage, mass, midline shift, or extra-axial fluid collection. The ventricles and sulci are within normal limits for age. Minimal hypodensities in the cerebral white matter are unchanged and nonspecific but compatible with chronic small vessel ischemia. Vascular: Calcified atherosclerosis at the skull base. No hyperdense vessel. Skull: No acute fracture or suspicious osseous lesion. Sinuses/Orbits: The visualized portions of the paranasal sinuses are clear. Trace right mastoid effusion. Bilateral cataract extraction. Other: None. IMPRESSION: No evidence of acute intracranial abnormality. Electronically Signed   By: Logan Bores M.D.   On: 12/29/2021 11:51   DG Chest Portable 1 View  Result Date: 12/29/2021 CLINICAL DATA:  Altered mental status. EXAM: PORTABLE CHEST 1 VIEW COMPARISON:  AP chest 11/23/2021 FINDINGS: Cardiac silhouette is again mildly enlarged. Mediastinal contours are within normal limits. Mild-to-moderate calcification within the aortic arch. The lungs are clear. No pleural effusion or pneumothorax. Moderate multilevel disc space narrowing and endplate osteophytes of the thoracic spine. ACDF hardware again overlies the lower cervical spine. IMPRESSION: 1. No acute pulmonary process. 2. Mild cardiomegaly, unchanged. Electronically Signed   By: Yvonne Kendall M.D.   On: 12/29/2021 10:21    A/P: Versie is a very pleasant 77 year old woman with multiple medical issues who has been in the emergency room  because of severe pain as well as pseudodementia.  Her pain progressed to the point where she had hypoxia and was ultimately admitted into the hospital.  Patient was complaining of severe pain due to her osteoporotic compression fracture of L3.  As result Ortho consultation was requested.  I was asked by my partner to see the patient for further treatment.  MRI demonstrates a benign-appearing L3 compression fracture.  Patient does have a hemangioma but no evidence of pathological signal changes.  At this point in time given the severity of her pain and significant deconditioning that she has undergone I think is reasonable to move forward with surgery.  I have gone over the surgical procedure with the patient which includes the risks and benefits.  Risks: Infection, bleeding, nerve damage, death, stroke, paralysis, ongoing or worse pain, additional fractures, leak of cement leading to additional pain or need for further surgery.  The patient has expressed understanding of the risks and benefits and has elected to move forward with surgery.  I will contact the hospitalist team to ensure there is no medical rationale  to prevent her from going to surgery this afternoon.

## 2022-01-16 NOTE — Brief Op Note (Signed)
01/16/2022  5:53 PM  PATIENT:  Danielle [name]  77 y.o. female  PRE-OPERATIVE DIAGNOSIS:  L-3 compression fracture  POST-OPERATIVE DIAGNOSIS:  L-3 compression fracture  PROCEDURE:  Procedure(s): L-3 COMPRESSION FRACTURE WITH KYPHOPLASTY (N/A)  SURGEON:  Surgeon(s) and Role:    Melina Schools, MD - Primary  PHYSICIAN ASSISTANT:   ASSISTANTS: none   ANESTHESIA:   general  EBL:  minimal   BLOOD ADMINISTERED:none  DRAINS: none   LOCAL MEDICATIONS USED:  MARCAINE     SPECIMEN:  No Specimen  DISPOSITION OF SPECIMEN:  N/A  COUNTS:  YES  TOURNIQUET:  * No tourniquets in log *  DICTATION: .Dragon Dictation  PLAN OF CARE: Admit to inpatient   PATIENT DISPOSITION:  PACU - hemodynamically stable.

## 2022-01-16 NOTE — Progress Notes (Signed)
Mobility Specialist - Progress Note    01/16/22 1359  Mobility  Activity Transferred to/from [facility]  Level of Assistance Minimal assist, patient does 75% or more  Assistive Device BSC  Distance Ambulated (ft) 5 ft  Range of Motion/Exercises Active  Activity Response Tolerated well  Mobility Referral No  $Mobility charge 1 Mobility   Pt received in bed and agreeable to transfer to [facility] w/ help of NT. No complaints during mobility.  [facility]

## 2022-01-16 NOTE — Anesthesia Procedure Notes (Signed)
Procedure Name: Intubation Date/Time: 01/16/2022 4:37 PM  Performed by: Montel Clock, CRNAPre-anesthesia Checklist: Patient identified, Emergency Drugs available, Suction available, Patient being monitored and Timeout performed Patient Re-evaluated:Patient Re-evaluated prior to induction Oxygen Delivery Method: Circle system utilized Preoxygenation: Pre-oxygenation with 100% oxygen Induction Type: IV induction Ventilation: Mask ventilation without difficulty Laryngoscope Size: Mac and 3 Grade View: Grade II Tube type: Oral Tube size: 7.5 mm Number of attempts: 1 Airway Equipment and Method: Stylet Placement Confirmation: ETT inserted through vocal cords under direct vision, positive ETCO2 and breath sounds checked- equal and bilateral Secured at: 23 cm Tube secured with: Tape Dental Injury: Teeth and Oropharynx as per pre-operative assessment

## 2022-01-16 NOTE — Anesthesia Preprocedure Evaluation (Addendum)
Anesthesia Evaluation  Patient identified by MRN, date of birth, ID band Patient awake    Reviewed: Allergy & Precautions, NPO status , Patient's Chart, lab work & pertinent test results  Airway Mallampati: II  TM Distance: >3 FB Neck ROM: Full    Dental no notable dental hx. (+) Teeth Intact, Dental Advisory Given   Pulmonary sleep apnea and Continuous Positive Airway Pressure Ventilation    Pulmonary exam normal breath sounds clear to auscultation       Cardiovascular hypertension, Normal cardiovascular exam Rhythm:Regular Rate:Normal  11/24/2021 TTE Echo  1. Left ventricular ejection fraction, by estimation, is 60 to 65%. The  left ventricle has normal function. The left ventricle has no regional  wall motion abnormalities. Left ventricular diastolic parameters were  normal.   2. Right ventricular systolic function is normal. The right ventricular  size is normal.   3. The mitral valve is abnormal. Trivial mitral valve regurgitation. No  evidence of mitral stenosis.   4. The aortic valve was not well visualized. There is moderate  calcification of the aortic valve. There is moderate thickening of the  aortic valve. Aortic valve regurgitation is not visualized. Aortic valve  sclerosis/calcification is present, without  any evidence of aortic stenosis.   5. The inferior vena cava is normal in size with greater than 50%  respiratory variability, suggesting right atrial pressure of 3 mmHg.     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   GI/Hepatic Neg liver ROS,GERD  Medicated and Controlled,,  Endo/Other  diabetesHypothyroidism    Renal/GU Lab Results      Component                Value               Date                      CREATININE               0.84                01/15/2022                BUN                      8                   01/15/2022                 NA                       137                 01/15/2022                K                        3.0 (L)             01/15/2022                CL                       105                 01/15/2022  CO2                      22                  01/15/2022                Musculoskeletal  (+) Arthritis ,    Abdominal   Peds  Hematology Lab Results      Component                Value               Date                      WBC                      7.4                 01/15/2022                HGB                      13.4                01/15/2022                HCT                      39.4                01/15/2022                MCV                      88.5                01/15/2022                PLT                      268                 01/15/2022              Anesthesia Other Findings ALL: Ceftin, Compazine, codeine  Reproductive/Obstetrics                             Anesthesia Physical Anesthesia Plan  ASA: 3  Anesthesia Plan: General   Post-op Pain Management: Minimal or no pain anticipated, Ofirmev IV (intra-op)* and Precedex   Induction: Intravenous  PONV Risk Score and Plan: 3 and 4 or greater and Treatment may vary due to age or medical condition, Ondansetron, Dexamethasone, TIVA and Propofol infusion  Airway Management Planned: Oral ETT  Additional Equipment: None  Intra-op Plan:   Post-operative Plan: Extubation in OR  Informed Consent: I have reviewed the patients History and Physical, chart, labs and discussed the procedure including the risks, benefits and alternatives for the proposed anesthesia with the patient or authorized representative who has indicated his/her understanding and acceptance.     Dental advisory given  Plan Discussed with: CRNA and Anesthesiologist  Anesthesia Plan Comments: (TIVA GA)       Anesthesia Quick Evaluation

## 2022-01-16 NOTE — Progress Notes (Signed)
PROGRESS NOTE    Danielle [name]  ZCH:[id number] DOB: [birth year] DOA: 12/29/2021 PCP: [name], MD   Brief Narrative:   77 y.o. female with medical history significant of CAD, pseudo-dementia, depression/anxiety, DM2, HLD, HTN, hypothyroidism, GERD who was in the ED psych hold for 12-days due to altered mental status with visual/auditory hallucinations and was evaluated by psych and found to not qualify for inpatient admission and TOC was working on facility placement.  She was having intermittent fevers in the ED not improving with oral Cipro which was started for possible UTI.  She was subsequently admitted and started on IV Zosyn.  Psychiatry was consulted.  Assessment & Plan:   UTI: Present on admission -Cultures negative so far.  Currently on Zosyn.  Afebrile for the last 48 hours.  Patient was on Cipro for few days prior to that.  DC antibiotics and monitor.    1/3: She c/o ab pain, dysuria again, cr uptrend, check bladder scan, reorder ua and urine culture no growth, she is not a reliable historian, continue to have intermittent hallucination   oral intake remain poor , consume less than 5% of the meal, continue hydration , cr normalized  Hypokalemia, replace k  Epigastric pain? 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  Continue Seroquel along with trazodone nightly as needed for sleep.  Prozac/hydroxyzine/Geodon discontinued by psychiatry. -Fall precautions.  Delirium precautions.  blood culture no growth    Chronic pain pain for 7 months per husband Mri thoracic and lumber spine from 12/18 reviewed  "Chronic appearing compression fracture of L3 with approximately  40% vertebral body height loss anteriorly.  there is no high-grade spinal canal stenosis in the thoracic or lumbar spine. Moderate bilateral neural foraminal narrowing at T1-T2"   Dr Moshe Cipro consulted, has kyphoplasty on 1/5   Hypertension -Monitor blood pressure.  Continue metoprolol  Hypothyroidism -Continue levothyroxine  Hyperlipidemia -Continue statin  Diabetes mellitus type 2 -Last A1c 5.7.  Blood sugars currently stable.  Carb modified diet  OSA -Continue CPAP nightly  Chronic kidney disease stage IIIa -Creatinine currently stable.  Monitor intermittently.  Goals of care -Palliative care consultation appreciated.  Patient remains full code.  Physical deconditioning -PT eval pending.  TOC following for SNF placement. Currently medically stable for discharge to SNF   DVT prophylaxis: Start Lovenox Code Status: Full Family Communication: spoke to husband at bedside on 1/3 and 1/5, husband can not provide adequate care for patient at home, currently husband and patient is agreeing to go to rehab facility  Disposition Plan:  Unsafe disposition Poor oral intake, hallucination, anxiety,    Consultants:  psychiatry , palliative care, Ortho  Procedures: Kyphoplasty  Antimicrobials:  Anti-infectives (From admission, onward)    Start     Dose/Rate Route Frequency Ordered Stop   01/17/22 0400  vancomycin (VANCOCIN) IVPB 1000 mg/200 mL premix        1,000 mg 200 mL/hr over 60 Minutes Intravenous Every 12 hours 01/16/22 1940 01/17/22 1559   01/16/22 1620  vancomycin (VANCOCIN) 1-5 GM/200ML-% IVPB       Note to Pharmacy: [name] R: cabinet override      01/16/22 1620 01/17/22 0429   01/10/22 2000  piperacillin-tazobactam (ZOSYN) IVPB 3.375 g  Status:  Discontinued  3.375 g 12.5 mL/hr over 240 Minutes Intravenous Every 8 hours 01/10/22 1405 01/13/22 0749   01/10/22 1415  piperacillin-tazobactam (ZOSYN) IVPB 3.375 g        3.375 g 100 mL/hr over 30  Minutes Intravenous  Once 01/10/22 1408 01/10/22 1538   01/10/22 1330  ciprofloxacin (CIPRO) IVPB 400 mg  Status:  Discontinued        400 mg 200 mL/hr over 60 Minutes Intravenous  Once 01/10/22 1327 01/10/22 1342   01/08/22 0330  ciprofloxacin (CIPRO) tablet 500 mg  Status:  Discontinued        500 mg Oral 2 times daily 01/08/22 0317 01/10/22 1405        Subjective:  She is seen after returned from kyphoplasty  She continues to have hallucination, and  has poor oral intake, she is not a reliable historian    Objective: Vitals:   01/16/22 1830 01/16/22 1845 01/16/22 1900 01/16/22 2114  BP: 97/74 121/68 121/82 133/68  Pulse: 98 99 100 (!) 105  Resp: '19 18 20 18  '$ Temp:   97.6 F (36.4 C) (!) 97.5 F (36.4 C)  TempSrc:    Oral  SpO2: 96% 94% 97% 97%  Weight:      Height:        Intake/Output Summary (Last 24 hours) at 01/16/2022 2251 Last data filed at 01/16/2022 1746 Gross per 24 hour  Intake 850 ml  Output 200 ml  Net 650 ml   Filed Weights   12/29/21 0908 01/16/22 1453  Weight: 87.5 kg 87.5 kg    Examination:  General: No acute distress currently.  On room air.  Looks chronically ill and deconditioned.  Flat affect.  Labile mood, crying at times, intermittent anxiety, hallucination.   respiratory: Bilateral decreased breath sounds at bases with scattered crackles CVS: S1-S2 heard; rate controlled currently abdominal: does not appear to have Epigastric tenderness, today, ab  soft, no guarding, no rigidity , bowel sounds heard normally  extremities: No cyanosis; no edema    Data Reviewed: I have personally reviewed following labs and imaging studies  CBC: Recent Labs  Lab 01/10/22 1128 01/11/22 0805 01/15/22 0846  WBC 8.3 8.2 7.4  NEUTROABS  --   --  4.6  HGB 13.9 13.7 13.4  HCT 41.7 42.1 39.4  MCV 89.5 91.7 88.5  PLT 284 278 671   Basic Metabolic Panel: Recent Labs  Lab 01/10/22 1128 01/11/22 0805 01/15/22 0846  NA 139 136 137  K 3.9 3.8 3.0*  CL  105 103 105  CO2 '25 23 22  '$ GLUCOSE 157* 174* 126*  BUN '22 21 8  '$ CREATININE 1.06* 1.23* 0.84  CALCIUM 9.7 9.5 9.1  MG  --   --  2.0   GFR: Estimated Creatinine Clearance: 60.7 mL/min (by C-G formula based on SCr of 0.84 mg/dL). Liver Function Tests: Recent Labs  Lab 01/10/22 1128 01/11/22 0805 01/15/22 0846  AST '18 22 17  '$ ALT '16 16 15  '$ ALKPHOS 64 62 59  BILITOT 1.0 1.1 0.7  PROT 6.4* 6.4* 6.1*  ALBUMIN 3.4* 3.5 3.4*   No results for input(s): "LIPASE", "AMYLASE" in the last 168 hours. No results for input(s): "AMMONIA" in the last 168 hours. Coagulation Profile: No results for input(s): "INR", "PROTIME" in the last 168 hours. Cardiac Enzymes: No results for input(s): "CKTOTAL", "CKMB", "CKMBINDEX", "TROPONINI" in the last 168 hours. BNP (last 3 results) No results for input(s): "PROBNP" in the last 8760 hours. HbA1C: No results for input(s): "HGBA1C"  in the last 72 hours. CBG: Recent Labs  Lab 01/16/22 1417 01/16/22 1836  GLUCAP 97 109*   Lipid Profile: No results for input(s): "CHOL", "HDL", "LDLCALC", "TRIG", "CHOLHDL", "LDLDIRECT" in the last 72 hours. Thyroid Function Tests: No results for input(s): "TSH", "T4TOTAL", "FREET4", "T3FREE", "THYROIDAB" in the last 72 hours. Anemia Panel: No results for input(s): "VITAMINB12", "FOLATE", "FERRITIN", "TIBC", "IRON", "RETICCTPCT" in the last 72 hours. Sepsis Labs: Recent Labs  Lab 01/10/22 1128  LATICACIDVEN 1.4    Recent Results (from the past 240 hour(s))  Urine Culture     Status: Abnormal   Collection Time: 01/08/22  2:40 AM   Specimen: Urine, Clean Catch  Result Value Ref Range Status   Specimen Description   Final    URINE, CLEAN CATCH Performed at [facility], [place] [address] [place], [place] [address]    Special Requests   Final    NONE Performed at [facility], [place] [address], [place], [place] [address]    Culture MULTIPLE SPECIES PRESENT, SUGGEST  RECOLLECTION (A)  Final   Report Status 01/09/2022 FINAL  Final  Urine Culture     Status: None   Collection Time: 01/10/22 11:15 AM   Specimen: In/Out Cath Urine  Result Value Ref Range Status   Specimen Description   Final    IN/OUT CATH URINE Performed at [place] [address], [place], [place] [address]    Special Requests   Final    NONE Performed at [facility], [place] [address], [place], [place] [address]    Culture   Final    NO GROWTH Performed at [facility], [place] [address] [place], [place] [address]    Report Status 01/11/2022 FINAL  Final  Urine Culture     Status: None   Collection Time: 01/14/22  4:30 PM   Specimen: Urine, Clean Catch  Result Value Ref Range Status   Specimen Description   Final    URINE, CLEAN CATCH Performed at [facility], [place] [address] [place], [place] [address]    Special Requests   Final    NONE Performed at [facility], [place] [address], [place], [place] [address]    Culture   Final    NO GROWTH Performed at [facility], [place] [address] [place], [place] [address]    Report Status 01/15/2022 FINAL  Final  Culture, blood (Routine X 2) w Reflex to ID Panel     Status: None (Preliminary result)   Collection Time: 01/15/22 11:54 AM   Specimen: BLOOD LEFT ARM  Result Value Ref Range Status   Specimen Description   Final    BLOOD LEFT ARM Performed at [place] [address], [place], [place] [address]    Special Requests   Final    BOTTLES DRAWN AEROBIC ONLY Blood Culture adequate volume Performed at [place] [address], [place], [place] [address]    Culture   Final    NO GROWTH < 24 HOURS Performed at [place] [address], [place], [place] [address]    Report Status PENDING  Incomplete  Culture, blood (Routine X 2) w Reflex to ID Panel     Status: None (Preliminary  result)   Collection Time: 01/15/22 11:54 AM   Specimen: BLOOD RIGHT HAND  Result Value Ref Range Status   Specimen Description   Final  BLOOD RIGHT HAND Performed at [facility], [place] [address], [place], [place] [address]    Special Requests   Final    BOTTLES DRAWN AEROBIC ONLY Blood Culture adequate volume Performed at [place] [address], [place], [place] [address]    Culture   Final    NO GROWTH < 24 HOURS Performed at [place] [address], [place], [place] [address]    Report Status PENDING  Incomplete  Surgical PCR screen     Status: None   Collection Time: 01/16/22  2:21 PM   Specimen: Nasal Mucosa; Nasal Swab  Result Value Ref Range Status   MRSA, PCR NEGATIVE NEGATIVE Final   Staphylococcus aureus NEGATIVE NEGATIVE Final    Comment: (NOTE) The Xpert SA Assay (FDA approved for NASAL specimens in patients 69 years of age and older), is one component of a comprehensive surveillance program. It is not intended to diagnose infection nor to guide or monitor treatment. Performed at [facility], [place] [address] [place], [place] [address]          Radiology Studies: DG Lumbar Spine 2-3 Views  Result Date: 01/16/2022 CLINICAL DATA:  L3 kyphoplasty EXAM: LUMBAR SPINE - 2-3 VIEW COMPARISON:  12/29/2021 FINDINGS: A single fluoroscopic images obtained during the performance of the procedure and is provided for interpretation. Vertebral augmentation is seen at the L3 level. Please refer to operative report. Fluoroscopy time: 1.0 minute, 28.932 mGy IMPRESSION: 1. Intraoperative evaluation as above. Electronically Signed   By: Randa Ngo M.D.   On: 01/16/2022 18:33   DG C-Arm 1-60 Min-No Report  Result Date: 01/16/2022 Fluoroscopy was utilized by the requesting physician.  No radiographic interpretation.   DG C-Arm 1-60 Min-No Report  Result Date: 01/16/2022 Fluoroscopy was utilized by the  requesting physician.  No radiographic interpretation.   DG C-Arm 1-60 Min-No Report  Result Date: 01/16/2022 Fluoroscopy was utilized by the requesting physician.  No radiographic interpretation.   DG C-Arm 1-60 Min-No Report  Result Date: 01/16/2022 Fluoroscopy was utilized by the requesting physician.  No radiographic interpretation.        Scheduled Meds:  levothyroxine  137 mcg Oral Q0600   mupirocin ointment  1 Application Nasal BID   pantoprazole  20 mg Oral Daily   QUEtiapine  100 mg Oral QHS   QUEtiapine  25 mg Oral BID   rosuvastatin  40 mg Oral Daily   senna-docusate  1 tablet Oral QHS   sodium chloride flush  3 mL Intravenous Q12H   tamsulosin  0.4 mg Oral QHS   Continuous Infusions:  sodium chloride     lactated ringers     methocarbamol (ROBAXIN) IV     vancomycin     [START ON 01/17/2022] vancomycin          Florencia Reasons, MD PhD FACP [facility] 01/16/2022, 10:51 PM

## 2022-01-16 NOTE — Op Note (Signed)
OPERATIVE REPORT  DATE OF SURGERY: 01/16/2022  PATIENT NAME:  Danielle [name] MRN: [id number] DOB: 1945-02-27  PCP: [name], MD  PRE-OPERATIVE DIAGNOSIS: L3 osteoporotic compression fracture  POST-OPERATIVE DIAGNOSIS: Same  PROCEDURE:   L3 kyphoplasty  SURGEON:  Melina Schools, MD  PHYSICIAN ASSISTANT: None  ANESTHESIA:   General  EBL: Minimal   Complications: None  BRIEF HISTORY: Danielle [name] is a 77 y.o. female who has had significant back pain for several months.  The pain was so severe she presented to the emergency room approximately 2 to 3 weeks ago.  The patient was ultimately admitted with ongoing back pain and orthopedic consultation was requested.  Patient continued to have severe pain with a known L3 osteoporotic compression fracture.  Because of the severity of her pain we elected to move forward with a kyphoplasty.  All appropriate risks, benefits, and alternatives to surgery were discussed and consent was obtained.  PROCEDURE DETAILS: Patient was brought into the operating room and was properly positioned on the operating room table.  After induction with general anesthesia the patient was endotracheally intubated.  A timeout was taken to confirm all important data: including patient, procedure, and the level. Teds, SCD's were applied.   Patient was turned prone onto the Wilson frame and all bony prominences well-padded.  Lumbar spine was prepped and draped in a standard fashion.  The C arm was brought into the field sterilely.  1 was placed in the AP field the other in the lateral.  We identified the L3 vertebral body in both planes.  Small stab incisions were made on the lateral side of the L3 pedicle and the Jamshidi needles were advanced percutaneously down to the lateral aspect of the L3 pedicle.  I advanced into the pedicle using the AP fluoroscopy to track my progression.  As I neared the medial wall of the pedicle I confirmed on the lateral  view that I was just beyond the posterior wall of the vertebral body.  Once I confirm my trajectory in both planes I advanced into the vertebral body itself.  I then placed the drill and then the curette to create of void in the vertebral body.  The inflatable bone tamps were then inserted and inflated to the appropriate height to maximize the cement placement.  The cement was mixed and it was at the appropriate consistency I removed the balloons and inserted the cement.  A total of approximately 6 cc of cement (3 cc on each side) was placed.  Imaging demonstrated satisfactory positioning of the cement.  Once the cement was allowed to cure I remove the Jamshidi needles.  The stab incisions were closed with interrupted 3-0 Monocryl stitches.  Dermabond and dry dressing were applied.  Patient was ultimately extubated and transferred to PACU without incident.  The end of the case all needle and sponge counts were correct.  There were no adverse intraoperative events.  Melina Schools, MD 01/16/2022 5:49 PM

## 2022-01-17 DIAGNOSIS — R4189 Other symptoms and signs involving cognitive functions and awareness: Secondary | ICD-10-CM | POA: Diagnosis not present

## 2022-01-17 LAB — CBC WITH DIFFERENTIAL/PLATELET
Abs Immature Granulocytes: 0.04 10*3/uL (ref 0.00–0.07)
Basophils Absolute: 0 10*3/uL (ref 0.0–0.1)
Basophils Relative: 0 %
Eosinophils Absolute: 0 10*3/uL (ref 0.0–0.5)
Eosinophils Relative: 0 %
HCT: 41.4 % (ref 36.0–46.0)
Hemoglobin: 13.8 g/dL (ref 12.0–15.0)
Immature Granulocytes: 0 %
Lymphocytes Relative: 10 %
Lymphs Abs: 1.1 10*3/uL (ref 0.7–4.0)
MCH: 30.1 pg (ref 26.0–34.0)
MCHC: 33.3 g/dL (ref 30.0–36.0)
MCV: 90.2 fL (ref 80.0–100.0)
Monocytes Absolute: 0.6 10*3/uL (ref 0.1–1.0)
Monocytes Relative: 6 %
Neutro Abs: 8.8 10*3/uL — ABNORMAL HIGH (ref 1.7–7.7)
Neutrophils Relative %: 84 %
Platelets: 276 10*3/uL (ref 150–400)
RBC: 4.59 MIL/uL (ref 3.87–5.11)
RDW: 13.5 % (ref 11.5–15.5)
WBC: 10.6 10*3/uL — ABNORMAL HIGH (ref 4.0–10.5)
nRBC: 0 % (ref 0.0–0.2)

## 2022-01-17 LAB — BASIC METABOLIC PANEL
Anion gap: 8 (ref 5–15)
BUN: 5 mg/dL — ABNORMAL LOW (ref 8–23)
CO2: 23 mmol/L (ref 22–32)
Calcium: 9.5 mg/dL (ref 8.9–10.3)
Chloride: 108 mmol/L (ref 98–111)
Creatinine, Ser: 0.77 mg/dL (ref 0.44–1.00)
GFR, Estimated: >60 mL/min (ref >60)
Glucose, Bld: 122 mg/dL — ABNORMAL HIGH (ref 70–99)
Potassium: 3.7 mmol/L (ref 3.5–5.1)
Sodium: 139 mmol/L (ref 135–145)

## 2022-01-17 LAB — PHOSPHORUS: Phosphorus: 3.2 mg/dL (ref 2.5–4.6)

## 2022-01-17 LAB — MAGNESIUM: Magnesium: 2.3 mg/dL (ref 1.7–2.4)

## 2022-01-17 MED ORDER — SODIUM CHLORIDE 0.9 % IV SOLN
INTRAVENOUS | Status: AC
  Administered 2022-01-18: 1000 mL via INTRAVENOUS

## 2022-01-17 MED ORDER — ENSURE ENLIVE PO LIQD
237.0000 mL | Freq: Two times a day (BID) | ORAL | Status: DC
  Administered 2022-01-17 – 2022-01-28 (×19): 237 mL via ORAL

## 2022-01-17 NOTE — Progress Notes (Signed)
Danielle [name]  MRN: [id number] DOB/Age: 01-26-1945 77 y.o. Physician: Ander Slade, M.D. 1 Day Post-Op Procedure(s) (LRB): L-3 COMPRESSION FRACTURE WITH KYPHOPLASTY (N/A)  Subjective: Patient in bedside chair.  Talking somewhat unintelligibly and appears to be having a conversation.  There is no one else in the room.  Upon questioning she does report complaints of bilateral hip pain which she states have been present for "a long time". Vital Signs Temp:  [97.5 F (36.4 C)-98.4 F (36.9 C)] 98.4 F (36.9 C) (01/06 0604) Pulse Rate:  [89-107] 107 (01/06 0604) Resp:  [14-20] 18 (01/06 0604) BP: (97-140)/(57-82) 126/69 (01/06 0604) SpO2:  [94 %-100 %] 98 % (01/06 0604) Weight:  [87.5 kg] 87.5 kg (01/05 1453)  Lab Results Recent Labs    01/15/22 0846 01/17/22 0904  WBC 7.4 10.6*  HGB 13.4 13.8  HCT 39.4 41.4  PLT 268 276   BMET Recent Labs    01/15/22 0846 01/17/22 0904  NA 137 139  K 3.0* 3.7  CL 105 108  CO2 22 23  GLUCOSE 126* 122*  BUN 8 5*  CREATININE 0.84 0.77  CALCIUM 9.1 9.5   INR  Date Value Ref Range Status  10/08/2016 0.99  Final     Exam  Inspection of the lower back demonstrates that the dressings over the kyphoplasty sites are clean and dry.  Leg mobility is limited but grossly nonfocal neurologic exam.  Plan Continue to mobilize.  Medical management per hospitalist team and psychiatry Danielle [name] 01/17/2022, 10:47 AM   Contact # [phone]

## 2022-01-17 NOTE — Anesthesia Postprocedure Evaluation (Signed)
Anesthesia Post Note  Patient: Danielle [name]  Procedure(s) Performed: L-3 COMPRESSION FRACTURE WITH KYPHOPLASTY (Spine Lumbar)     Patient location during evaluation: PACU Anesthesia Type: General Level of consciousness: awake and alert Pain management: pain level controlled Vital Signs Assessment: post-procedure vital signs reviewed and stable Respiratory status: spontaneous breathing, nonlabored ventilation, respiratory function stable and patient connected to nasal cannula oxygen Cardiovascular status: blood pressure returned to baseline and stable Postop Assessment: no apparent nausea or vomiting Anesthetic complications: no  No notable events documented.  Last Vitals:  Vitals:   01/17/22 0604 01/17/22 1434  BP: 126/69 (!) 110/44  Pulse: (!) 107 (!) 102  Resp: 18 18  Temp: 36.9 C 36.8 C  SpO2: 98% 100%    Last Pain:  Vitals:   01/17/22 1434  TempSrc: Oral  PainSc:                  [place]et [place]

## 2022-01-17 NOTE — Progress Notes (Signed)
PROGRESS NOTE    Danielle [name]  SJG:[id number] DOB: 1945-06-28 DOA: 12/29/2021 PCP: [name], MD   Brief Narrative:   77 y.o. female with medical history significant of CAD, pseudo-dementia, depression/anxiety, DM2, HLD, HTN, hypothyroidism, GERD who was in the ED psych hold for 12-days due to altered mental status with visual/auditory hallucinations and was evaluated by psych and found to not qualify for inpatient admission and TOC was working on facility placement.    She was having intermittent fevers in the ED not improving with oral Cipro which was started for possible UTI.  She was subsequently admitted and started on IV Zosyn and finished abx  Psychiatry was consulted. Palliative care following   she is not a reliable historian, continue to have intermittent hallucination   Assessment & Plan:   UTI: Present on admission -Cultures negative , finished  Zosyn.  Patient was on Cipro for few days prior to that.  -no more fever    AKI on CKDII, likely prerenal due to poor intake Cr peaked to 1.23, down to 0.7 after hydration  -Encourage oral intake, start Ensure  Hypokalemia, replace k  Epigastric pain? 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  Continue Seroquel along with trazodone nightly as needed for sleep.  Prozac/hydroxyzine/Geodon discontinued by psychiatry. -Fall precautions.  Delirium precautions.  blood culture no growth    Chronic pain pain for 7 months per husband Status post bilateral total hip arthroplasty. Mri thoracic and lumber spine from 12/18 reviewed  "Chronic appearing compression fracture of L3 with approximately 40% vertebral  body height loss anteriorly.  there is no high-grade spinal canal stenosis in the thoracic or lumbar spine. Moderate bilateral neural foraminal narrowing at T1-T2"  Dr Moshe Cipro consulted, s/p kyphoplasty on 1/5      Hypertension -Monitor blood pressure.  Continue metoprolol  Hypothyroidism -Continue levothyroxine  Hyperlipidemia -Continue statin  Diabetes mellitus type 2 -Last A1c 5.7.  Blood sugars currently stable.   OSA -Continue CPAP nightly   Goals of care -Palliative care consultation appreciated.  Patient remains full code.  Physical deconditioning -PT eval pending.  TOC following for SNF placement. Currently medically stable for discharge to SNF Difficult placement , husband is not able take care of her  DVT prophylaxis: Start Lovenox Code Status: Full Family Communication: spoke to husband at bedside on 1/3 and 1/5, husband can not provide adequate care for patient at home, currently husband and patient is agreeing to go to rehab facility  Disposition Plan:  Unsafe disposition Poor oral intake, hallucination, anxiety,    Consultants:  psychiatry , palliative care, Ortho  Procedures: Kyphoplasty  Antimicrobials:  Anti-infectives (From admission, onward)    Start     Dose/Rate Route Frequency Ordered Stop   01/17/22 0400  vancomycin (VANCOCIN) IVPB 1000 mg/200 mL premix        1,000 mg 200 mL/hr over 60 Minutes Intravenous Every 12 hours 01/16/22 1940 01/17/22 0925   01/16/22 1620  vancomycin (VANCOCIN) 1-5 GM/200ML-% IVPB       Note to Pharmacy: [name] R: cabinet override      01/16/22 1620 01/17/22 0429   01/10/22 2000  piperacillin-tazobactam (ZOSYN) IVPB 3.375 g  Status:  Discontinued  3.375 g 12.5 mL/hr over 240 Minutes Intravenous Every 8 hours 01/10/22 1405 01/13/22 0749   01/10/22 1415  piperacillin-tazobactam (ZOSYN) IVPB 3.375 g        3.375 g 100 mL/hr over 30 Minutes Intravenous  Once 01/10/22 1408 01/10/22 1538   01/10/22  1330  ciprofloxacin (CIPRO) IVPB 400 mg  Status:  Discontinued        400 mg 200 mL/hr over 60 Minutes Intravenous  Once 01/10/22 1327 01/10/22 1342   01/08/22 0330  ciprofloxacin (CIPRO) tablet 500 mg  Status:  Discontinued        500 mg Oral 2 times daily 01/08/22 0317 01/10/22 1405        Subjective:  S/p kyphoplasty on 1/5  She continues to have intermittent hallucination, and  has poor oral intake, she is not a reliable historian    She is sitting up in chair  Objective: Vitals:   01/16/22 1845 01/16/22 1900 01/16/22 2114 01/17/22 0604  BP: 121/68 121/82 133/68 126/69  Pulse: 99 100 (!) 105 (!) 107  Resp: '18 20 18 18  '$ Temp:  97.6 F (36.4 C) (!) 97.5 F (36.4 C) 98.4 F (36.9 C)  TempSrc:   Oral Oral  SpO2: 94% 97% 97% 98%  Weight:      Height:        Intake/Output Summary (Last 24 hours) at 01/17/2022 1155 Last data filed at 01/17/2022 0600 Gross per 24 hour  Intake 1550 ml  Output 800 ml  Net 750 ml   Filed Weights   12/29/21 0908 01/16/22 1453  Weight: 87.5 kg 87.5 kg    Examination:  General: alert, awake,  intermittent anxiety, hallucination.   respiratory: Clear to auscultation bilaterally CVS: S1-S2 heard; rate controlled currently abdominal: Nontender ,ab  soft, no guarding, no rigidity , bowel sounds heard normally  extremities: No cyanosis; no edema    Data Reviewed: I have personally reviewed following labs and imaging studies  CBC: Recent Labs  Lab 01/11/22 0805 01/15/22 0846 01/17/22 0904  WBC 8.2 7.4 10.6*  NEUTROABS  --  4.6 8.8*  HGB 13.7 13.4 13.8  HCT 42.1 39.4 41.4  MCV 91.7 88.5 90.2  PLT 278 268 314   Basic Metabolic Panel: Recent Labs  Lab 01/11/22 0805 01/15/22 0846 01/17/22 0904  NA 136 137 139  K 3.8 3.0* 3.7  CL 103 105 108  CO2 '23 22 23  '$ GLUCOSE 174* 126* 122*  BUN 21 8 5*  CREATININE 1.23* 0.84 0.77  CALCIUM 9.5 9.1 9.5  MG  --  2.0 2.3  PHOS  --   --  3.2   GFR: Estimated Creatinine Clearance: 63.7  mL/min (by C-G formula based on SCr of 0.77 mg/dL). Liver Function Tests: Recent Labs  Lab 01/11/22 0805 01/15/22 0846  AST 22 17  ALT 16 15  ALKPHOS 62 59  BILITOT 1.1 0.7  PROT 6.4* 6.1*  ALBUMIN 3.5 3.4*   No results for input(s): "LIPASE", "AMYLASE" in the last 168 hours. No results for input(s): "AMMONIA" in the last 168 hours. Coagulation Profile: No results for input(s): "INR", "PROTIME" in the last 168 hours. Cardiac Enzymes: No results for input(s): "CKTOTAL", "CKMB", "CKMBINDEX", "TROPONINI" in the last 168 hours. BNP (last 3 results) No results for input(s): "PROBNP" in the last 8760 hours. HbA1C: No results for input(s): "HGBA1C" in the last 72 hours. CBG: Recent Labs  Lab 01/16/22 1417 01/16/22 1836  GLUCAP 97 109*   Lipid Profile: No results for input(s): "  CHOL", "HDL", "LDLCALC", "TRIG", "CHOLHDL", "LDLDIRECT" in the last 72 hours. Thyroid Function Tests: No results for input(s): "TSH", "T4TOTAL", "FREET4", "T3FREE", "THYROIDAB" in the last 72 hours. Anemia Panel: No results for input(s): "VITAMINB12", "FOLATE", "FERRITIN", "TIBC", "IRON", "RETICCTPCT" in the last 72 hours. Sepsis Labs: No results for input(s): "PROCALCITON", "LATICACIDVEN" in the last 168 hours.   Recent Results (from the past 240 hour(s))  Urine Culture     Status: Abnormal   Collection Time: 01/08/22  2:40 AM   Specimen: Urine, Clean Catch  Result Value Ref Range Status   Specimen Description   Final    URINE, CLEAN CATCH Performed at [facility], [place] [address] [place], [place] [address]    Special Requests   Final    NONE Performed at [facility], [place] [address] [place], [place] [address]    Culture MULTIPLE SPECIES PRESENT, SUGGEST RECOLLECTION (A)  Final   Report Status 01/09/2022 FINAL  Final  Urine Culture     Status: None   Collection Time: 01/10/22 11:15 AM   Specimen: In/Out Cath Urine  Result Value Ref Range Status    Specimen Description   Final    IN/OUT CATH URINE Performed at [facility] [address], [place], [place] [address]    Special Requests   Final    NONE Performed at [facility], [place] [address], [place], [place] [address]    Culture   Final    NO GROWTH Performed at [facility], [place] [address] [place], [place] [address]    Report Status 01/11/2022 FINAL  Final  Urine Culture     Status: None   Collection Time: 01/14/22  4:30 PM   Specimen: Urine, Clean Catch  Result Value Ref Range Status   Specimen Description   Final    URINE, CLEAN CATCH Performed at [facility], [place] [address], [place], [place] [address]    Special Requests   Final    NONE Performed at [facility], [place] [address], [place], [place] [address]    Culture   Final    NO GROWTH Performed at [facility], [place] [address] [place], [place] [address]    Report Status 01/15/2022 FINAL  Final  Culture, blood (Routine X 2) w Reflex to ID Panel     Status: None (Preliminary result)   Collection Time: 01/15/22 11:54 AM   Specimen: BLOOD LEFT ARM  Result Value Ref Range Status   Specimen Description   Final    BLOOD LEFT ARM Performed at [facility] [address] [place], [place] [address]    Special Requests   Final    BOTTLES DRAWN AEROBIC ONLY Blood Culture adequate volume Performed at [facility] [address] [place], [place] [address]    Culture   Final    NO GROWTH < 24 HOURS Performed at [facility] [address] [place], [place] [address]    Report Status PENDING  Incomplete  Culture, blood (Routine X 2) w Reflex to ID Panel     Status: None (Preliminary result)   Collection Time: 01/15/22 11:54 AM   Specimen: BLOOD RIGHT HAND  Result Value Ref Range Status   Specimen Description   Final    BLOOD RIGHT HAND Performed at [facility] [address] [place],  [address]    Special Requests   Final  BOTTLES DRAWN AEROBIC ONLY Blood Culture adequate volume Performed at [facility] [address] [place], [place] [address]    Culture   Final    NO GROWTH < 24 HOURS Performed at [facility] [address] [place], [place] [address]    Report Status PENDING  Incomplete  Surgical PCR screen     Status: None   Collection Time: 01/16/22  2:21 PM   Specimen: Nasal Mucosa; Nasal Swab  Result Value Ref Range Status   MRSA, PCR NEGATIVE NEGATIVE Final   Staphylococcus aureus NEGATIVE NEGATIVE Final    Comment: (NOTE) The Xpert SA Assay (FDA approved for NASAL specimens in patients 31 years of age and older), is one component of a comprehensive surveillance program. It is not intended to diagnose infection nor to guide or monitor treatment. Performed at [facility], [place] [address], [place], [place] [address]          Radiology Studies: DG Lumbar Spine 2-3 Views  Result Date: 01/16/2022 CLINICAL DATA:  L3 kyphoplasty EXAM: LUMBAR SPINE - 2-3 VIEW COMPARISON:  12/29/2021 FINDINGS: A single fluoroscopic images obtained during the performance of the procedure and is provided for interpretation. Vertebral augmentation is seen at the L3 level. Please refer to operative report. Fluoroscopy time: 1.0 minute, 28.932 mGy IMPRESSION: 1. Intraoperative evaluation as above. Electronically Signed   By: Randa Ngo M.D.   On: 01/16/2022 18:33   DG C-Arm 1-60 Min-No Report  Result Date: 01/16/2022 Fluoroscopy was utilized by the requesting physician.  No radiographic interpretation.   DG C-Arm 1-60 Min-No Report  Result Date: 01/16/2022 Fluoroscopy was utilized by the requesting physician.  No radiographic interpretation.   DG C-Arm 1-60 Min-No Report  Result Date: 01/16/2022 Fluoroscopy was utilized by the requesting physician.  No radiographic interpretation.   DG  C-Arm 1-60 Min-No Report  Result Date: 01/16/2022 Fluoroscopy was utilized by the requesting physician.  No radiographic interpretation.        Scheduled Meds:  levothyroxine  137 mcg Oral Q0600   mupirocin ointment  1 Application Nasal BID   pantoprazole  20 mg Oral Daily   QUEtiapine  100 mg Oral QHS   QUEtiapine  25 mg Oral BID   rosuvastatin  40 mg Oral Daily   senna-docusate  1 tablet Oral QHS   sodium chloride flush  3 mL Intravenous Q12H   tamsulosin  0.4 mg Oral QHS   Continuous Infusions:  sodium chloride     lactated ringers     methocarbamol (ROBAXIN) IV          Florencia Reasons, MD PhD FACP [facility] 01/17/2022, 11:55 AM

## 2022-01-17 NOTE — Evaluation (Signed)
Occupational Therapy Evaluation Patient Details Name: Danielle [name] MRN: [id number] DOB: 09-29-1945 Today's Date: 01/17/2022   History of Present Illness Patient is a 77 year old female who presents to the emergency department 12/29/21 with concern for abnormal behaviors, recently in hospital for same.patient noted to have "Chronic appearing compression fracture of L3 with approximately 40%  vertebral body height loss anteriorly.   there is no high-grade spinal canal stenosis in the thoracic or lumbar spine. Moderate bilateral neural foraminal narrowing at T1-T2" .  patient underwent kyphoplasty on 1/5. no brace needed per orders. PMH: pseudodementia, depression, anxiety   Clinical Impression   Patient is a 77 year old female who was admitted for above. Patient was noted to require increased cues for safety and to maintain back precautions during session. Patient was noted to have had khyoplasty on 1/5 with orders stating no back brace was needed. Patient was noted to have decreased functional activity tolernace, decreased ROM, decreased BUE strength, decreased endurance, decreased sitting balance, decreased standing balanced, decreased safety awareness, and decreased knowledge of AE/AD impacting participation in ADLs. Patient would continue to benefit from skilled OT services at this time while admitted and after d/c to address noted deficits in order to improve overall safety and independence in ADLs.         Recommendations for follow up therapy are one component of a multi-disciplinary discharge planning process, led by the attending physician.  Recommendations may be updated based on patient status, additional functional criteria and insurance authorization.   Follow Up Recommendations  Skilled nursing-short term rehab (<3 hours/day)     Assistance Recommended at Discharge Frequent or constant Supervision/Assistance  Patient can return home with the following A lot of help with  bathing/dressing/bathroom;Assistance with cooking/housework;Direct supervision/assist for medications management;Assist for transportation;Direct supervision/assist for financial management;Help with stairs or ramp for entrance;Two people to help with walking and/or transfers    Functional Status Assessment  Patient has had a recent decline in their functional status and demonstrates the ability to make significant improvements in function in a reasonable and predictable amount of time.  Equipment Recommendations  Other (comment) (defer to next venue)    Recommendations for Other Services       Precautions / Restrictions Precautions Precautions: Fall Precaution Comments: dementia, Wernicke's type aphasia, back preacutions Restrictions Weight Bearing Restrictions: No         Balance Overall balance assessment: Needs assistance Sitting-balance support: Feet supported, Bilateral upper extremity supported Sitting balance-Leahy Scale: Poor     Standing balance support: Bilateral upper extremity supported, During functional activity, Reliant on assistive device for balance Standing balance-Leahy Scale: Poor           ADL either performed or assessed with clinical judgement   ADL Overall ADL's : Needs assistance/impaired Eating/Feeding: Set up;Supervision/ safety;Sitting   Grooming: Oral care;Set up;Supervision/safety;Sitting Grooming Details (indicate cue type and reason): in recliner Upper Body Bathing: Sitting;Moderate assistance   Lower Body Bathing: Maximal assistance;Sitting/lateral leans;Sit to/from stand   Upper Body Dressing : Moderate assistance;Sitting;Standing   Lower Body Dressing: Sitting/lateral leans;Maximal assistance Lower Body Dressing Details (indicate cue type and reason): unable to bring BLE to lap. back precautions Toilet Transfer: Moderate assistance;Rolling walker (2 wheels) Toilet Transfer Details (indicate cue type and reason): to transfer from  edge of bed to recliner in room. Toileting- Clothing Manipulation and Hygiene: Total assistance;Sit to/from stand       Functional mobility during ADLs: Moderate assistance;Rolling walker (2 wheels)        Pertinent  Vitals/Pain Pain Assessment Pain Assessment: Faces Faces Pain Scale: Hurts even more Pain Location: back Pain Descriptors / Indicators: Discomfort, Guarding, Grimacing Pain Intervention(s): Limited activity within patient's tolerance, Monitored during session, Repositioned, Other (comment) (nurse made aware of patients discomfort and possible need for pain medications.)     Hand Dominance Left      Communication Communication Communication: Other (comment) (expressive difficulties)   Cognition Arousal/Alertness: Awake/alert Behavior During Therapy: Flat affect Overall Cognitive Status: No family/caregiver present to determine baseline cognitive functioning Area of Impairment: Orientation, Attention, Following commands, Awareness       Current Attention Level: Focused   Following Commands: Follows one step commands consistently, Follows one step commands with increased time       General Comments: patient was noted to have word finding difficulties but able to answer some yes no questions appropirately. patient noted to have difficult time sequencing through seting up to brush teeth but able to complete task once set up/                [place] expects to be discharged to:: Private residence Living Arrangements: Spouse/significant other Available Help at Discharge: Family Type of Home: House Home Access: Stairs to enter Technical brewer of Steps: 2   Home Layout: Two level;1/2 bath on main level Alternate Level Stairs-Number of Steps: flight- has chair lift   Bathroom Shower/Tub: Occupational psychologist: Standard     Home Equipment: Conservation officer, nature (2 wheels);Shower seat;Shower seat - built in;Cane - single  point;Adaptive equipment   Additional Comments: information is from PT evaluation at start of this hosptial visit. no family in room and patient unable to provide PLOF.      Prior Functioning/Environment Prior Level of Function : Needs assist       Physical Assist : ADLs (physical)     Mobility Comments: per Ed notes, spouse has been providing assistance  for patient, ? what level ADLs Comments: pt spouse helping with LB dressing i.e. shoes, socks, pulling up pants        OT Problem List: Decreased activity tolerance;Impaired balance (sitting and/or standing);Decreased safety awareness;Decreased knowledge of precautions;Decreased knowledge of use of DME or AE;Decreased cognition;Pain      OT Treatment/Interventions: Self-care/ADL training;Energy conservation;Therapeutic exercise;DME and/or AE instruction;Patient/family education;Balance training;Therapeutic activities    OT Goals(Current goals can be found in the care plan section) Acute Rehab OT Goals Patient Stated Goal: none stated OT Goal Formulation: Patient unable to participate in goal setting Time For Goal Achievement: 01/31/22 Potential to Achieve Goals: Fair  OT Frequency: Min 2X/week       AM-PAC OT "6 Clicks" Daily Activity     Outcome Measure Help from another person eating meals?: A Little Help from another person taking care of personal grooming?: A Little Help from another person toileting, which includes using toliet, bedpan, or urinal?: A Lot Help from another person bathing (including washing, rinsing, drying)?: A Lot Help from another person to put on and taking off regular upper body clothing?: A Lot Help from another person to put on and taking off regular lower body clothing?: A Lot 6 Click Score: 14   End of Session Equipment Utilized During Treatment: Gait belt;Rolling walker (2 wheels) Nurse Communication: Mobility status;Other (comment) (patients perceived pain level)  Activity Tolerance:  Patient tolerated treatment well Patient left: in chair;with call bell/phone within reach;with chair alarm set;with nursing/sitter in room (NT)  OT Visit Diagnosis: Unsteadiness on feet (R26.81);Other abnormalities of gait and  mobility (R26.89);Muscle weakness (generalized) (M62.81);History of falling (Z91.81);Pain                Time: 2493-2419 OT Time Calculation (min): 24 min Charges:  OT General Charges $OT Visit: 1 Visit OT Evaluation $OT Eval Moderate Complexity: 1 Mod OT Treatments $Self Care/Home Management : 8-22 mins  Rennie Plowman, MS Acute Rehabilitation Department Office# [phone]   Willa Rough 01/17/2022, 2:07 PM

## 2022-01-17 NOTE — Progress Notes (Signed)
Physical Therapy Treatment Patient Details Name: Danielle [name] MRN: [id number] DOB: April 14, 1945 Today's Date: 01/17/2022   History of Present Illness Patient is a 77 year old female who presents to the emergency department 12/29/21 with concern for abnormal behaviors, recently in hospital for same.patient noted to have "Chronic appearing compression fracture of L3 with approximately 40%  vertebral body height loss anteriorly.   there is no high-grade spinal canal stenosis in the thoracic or lumbar spine. Moderate bilateral neural foraminal narrowing at T1-T2" .  patient underwent kyphoplasty on 1/5. no brace needed per orders. PMH: pseudodementia, depression, anxiety.    PT Comments    Pt s/p L3 kyphoplasty on 01/16/22.  Goals still appropriate.  Con't to recommend SNF.   Recommendations for follow up therapy are one component of a multi-disciplinary discharge planning process, led by the attending physician.  Recommendations may be updated based on patient status, additional functional criteria and insurance authorization.  Follow Up Recommendations  Skilled nursing-short term rehab (<3 hours/day)     Assistance Recommended at Discharge Frequent or constant Supervision/Assistance  Patient can return home with the following Two people to help with walking and/or transfers;Two people to help with bathing/dressing/bathroom;Assistance with cooking/housework;Assistance with feeding;Direct supervision/assist for medications management;Direct supervision/assist for financial management;Assist for transportation;Help with stairs or ramp for entrance   Equipment Recommendations  None recommended by PT    Recommendations for Other Services       Precautions / Restrictions Precautions Precautions: Fall Precaution Comments: dementia, Wernicke's type aphasia, back precautions Required Braces or Orthoses:  (no brace needed per MD) Restrictions Weight Bearing Restrictions: No     Mobility  Bed  Mobility Overal bed mobility: Needs Assistance Bed Mobility: Rolling, Sidelying to Sit, Sit to Sidelying Rolling: Min assist Sidelying to sit: Mod assist     Sit to sidelying: Max assist General bed mobility comments: Needed more A with sit > sidelying, but didn't report increase in pain and no significant increase in grimacing    Transfers Overall transfer level: Needs assistance Equipment used: Rolling walker (2 wheels) Transfers: Sit to/from Stand Sit to Stand: Min assist, Mod assist           General transfer comment: MOD A first rep and MIN A 2nd    Ambulation/Gait Ambulation/Gait assistance: Min assist, Mod assist Gait Distance (Feet): 35 Feet Assistive device: Rolling walker (2 wheels) Gait Pattern/deviations: Step-to pattern, Shuffle, Decreased step length - right, Decreased step length - left Gait velocity: decreased     General Gait Details: First half of gait required primarily MIN A then with turning around and rest of gait, required MOD A for maneuevering RW   Stairs             Wheelchair Mobility    Modified Rankin (Stroke Patients Only)       Balance Overall balance assessment: Needs assistance Sitting-balance support: Feet supported, Bilateral upper extremity supported Sitting balance-Leahy Scale: Fair     Standing balance support: Bilateral upper extremity supported, During functional activity, Reliant on assistive device for balance Standing balance-Leahy Scale: Poor                              Cognition Arousal/Alertness: Awake/alert Behavior During Therapy: Flat affect Overall Cognitive Status: No family/caregiver present to determine baseline cognitive functioning Area of Impairment: Orientation, Attention, Following commands, Awareness  Awareness: Intellectual   General Comments: Word salad        Exercises      General Comments        Pertinent Vitals/Pain Pain  Assessment Pain Assessment: Faces Faces Pain Scale: Hurts a little bit Pain Location: back Pain Descriptors / Indicators: Discomfort Pain Intervention(s): Limited activity within patient's tolerance, Monitored during session    Home Living Family/patient expects to be discharged to:: Private residence Living Arrangements: Spouse/significant other Available Help at Discharge: Family Type of Home: House Home Access: Stairs to enter   Technical brewer of Steps: 2 Alternate Level Stairs-Number of Steps: flight- has chair lift Home Layout: Two level;1/2 bath on main level Home Equipment: Conservation officer, nature (2 wheels);Shower seat;Shower seat - built in;Cane - single point;Adaptive equipment Additional Comments: information is from PT evaluation at start of this hosptial visit. no family in room and patient unable to provide PLOF.    Prior Function            PT Goals (current goals can now be found in the care plan section) Acute Rehab PT Goals PT Goal Formulation: Patient unable to participate in goal setting Time For Goal Achievement: 01/31/22 Potential to Achieve Goals: Fair Progress towards PT goals: Progressing toward goals    Frequency    Min 2X/week      PT Plan Current plan remains appropriate    Co-evaluation              AM-PAC PT "6 Clicks" Mobility   Outcome Measure  Help needed turning from your back to your side while in a flat bed without using bedrails?: A Little Help needed moving from lying on your back to sitting on the side of a flat bed without using bedrails?: A Little Help needed moving to and from a bed to a chair (including a wheelchair)?: A Lot Help needed standing up from a chair using your arms (e.g., wheelchair or bedside chair)?: A Little Help needed to walk in hospital room?: A Lot Help needed climbing 3-5 steps with a railing? : A Lot 6 Click Score: 15    End of Session Equipment Utilized During Treatment: Gait belt Activity  Tolerance: Patient tolerated treatment well Patient left: in bed;with call bell/phone within reach;with bed alarm set Nurse Communication: Mobility status PT Visit Diagnosis: Unsteadiness on feet (R26.81);Difficulty in walking, not elsewhere classified (R26.2)     Time: 2229-7989 PT Time Calculation (min) (ACUTE ONLY): 27 min  Charges:  $Gait Training: 8-22 mins $Therapeutic Activity: 8-22 mins                     Lydia Guiles., PT Office [phone] Acute Rehab 01/17/2022    Galen Manila 01/17/2022, 3:57 PM

## 2022-01-17 NOTE — Progress Notes (Signed)
  Daily Progress Note   Patient Name: Danielle [name]       Date: 01/17/2022 DOB: 02/09/1945  Age: 77 y.o. MRN#: [id number] Attending Physician: Florencia Reasons, MD Primary Care Physician: [name], MD Admit Date: 12/29/2021 Length of Stay: 2 days  Palliative medicine team has continued to follow along with patient's journey. Patient has MOST file in EMR from 11/12/20. No documentation noted naming HCPOA though if patient unable to make medical decisions for herself, her husband is NOK. Patient underwent kyphoplasty on 01/16/22 to hopefully assist with alleviating back pain that could be contributing to confusion. Patient will have to work with PT for evaluation and may need rehab.  At this time, goals for medical care are currently determined. Patient will need further psychiatry follow up in the outpatient setting. Should further PMT needs arise, please reach out. Will sign off at this time. Thank you for involving our team in patient's care.    Chelsea Aus, DO Palliative Care Provider PMT # [phone]

## 2022-01-18 DIAGNOSIS — R4189 Other symptoms and signs involving cognitive functions and awareness: Secondary | ICD-10-CM | POA: Diagnosis not present

## 2022-01-18 LAB — BASIC METABOLIC PANEL
Anion gap: 9 (ref 5–15)
BUN: 6 mg/dL — ABNORMAL LOW (ref 8–23)
CO2: 22 mmol/L (ref 22–32)
Calcium: 9.4 mg/dL (ref 8.9–10.3)
Chloride: 110 mmol/L (ref 98–111)
Creatinine, Ser: 0.81 mg/dL (ref 0.44–1.00)
GFR, Estimated: >60 mL/min (ref >60)
Glucose, Bld: 95 mg/dL (ref 70–99)
Potassium: 3.2 mmol/L — ABNORMAL LOW (ref 3.5–5.1)
Sodium: 141 mmol/L (ref 135–145)

## 2022-01-18 MED ORDER — TAMSULOSIN HCL 0.4 MG PO CAPS
0.8000 mg | ORAL_CAPSULE | Freq: Every day | ORAL | Status: DC
  Administered 2022-01-18 – 2022-01-27 (×10): 0.8 mg via ORAL
  Filled 2022-01-18 (×10): qty 2

## 2022-01-18 MED ORDER — SODIUM CHLORIDE 0.9 % IV SOLN
INTRAVENOUS | Status: AC
  Administered 2022-01-18: 300 mL via INTRAVENOUS

## 2022-01-18 MED ORDER — ENOXAPARIN SODIUM 40 MG/0.4ML IJ SOSY
40.0000 mg | PREFILLED_SYRINGE | INTRAMUSCULAR | Status: DC
  Administered 2022-01-18 – 2022-01-27 (×10): 40 mg via SUBCUTANEOUS
  Filled 2022-01-18 (×10): qty 0.4

## 2022-01-18 MED ORDER — POTASSIUM CHLORIDE 20 MEQ PO PACK
40.0000 meq | PACK | Freq: Every day | ORAL | Status: DC
  Administered 2022-01-18 – 2022-01-28 (×11): 40 meq via ORAL
  Filled 2022-01-18 (×11): qty 2

## 2022-01-18 MED ORDER — PROPRANOLOL HCL 10 MG PO TABS
10.0000 mg | ORAL_TABLET | Freq: Three times a day (TID) | ORAL | Status: DC
  Administered 2022-01-18 – 2022-01-28 (×29): 10 mg via ORAL
  Filled 2022-01-18 (×30): qty 1

## 2022-01-18 NOTE — Progress Notes (Signed)
   01/18/22 1414  Vitals  Temp 97.6 F (36.4 C)  Temp Source Oral  BP 134/74  MAP (mmHg) 90  BP Location Left Arm  BP Method Automatic  Patient Position (if appropriate) Sitting  Pulse Rate (!) 113  Pulse Rate Source Monitor  Resp 16  MEWS COLOR  MEWS Score Color Yellow  Oxygen Therapy  SpO2 99 %  O2 Device Room Air  PAINAD (Pain Assessment in Advanced Dementia)  Breathing 0  Negative Vocalization 0  Facial Expression 0  Body Language 0  Consolability 0  PAINAD Score 0  POSS Scale (Pasero Opioid Sedation Scale)  POSS *See Group Information* 1-Acceptable,Awake and alert  PCA/Epidural/Spinal Assessment  Respiratory Pattern Regular;Unlabored  MEWS Score  MEWS Temp 0  MEWS Systolic 0  MEWS Pulse 2  MEWS RR 0  MEWS LOC 0  MEWS Score 2  Provider Notification  Provider Name/Title Dr. Florencia Reasons  Date Provider Notified 01/18/22  Time Provider Notified 1420  Method of Notification Face-to-face  Notification Reason Other (Comment) (yellow mews P-113)  Provider response No new orders  Date of Provider Response 01/18/22  Time of Provider Response 1426

## 2022-01-18 NOTE — Progress Notes (Signed)
Urinated 200 ml. '@18'$ :45, Bladder scanned right after for 50 ml.

## 2022-01-18 NOTE — Progress Notes (Signed)
PROGRESS NOTE    Danielle [name]  OQH:[id number] DOB: 1945-02-27 DOA: 12/29/2021 PCP: [name], MD   Brief Narrative:   77 y.o. female with medical history significant of CAD, pseudo-dementia, depression/anxiety, DM2, HLD, HTN, hypothyroidism, GERD who was in the ED psych hold for 12-days due to altered mental status with visual/auditory hallucinations and was evaluated by psych and found to not qualify for inpatient admission and TOC was working on facility placement.    She was having intermittent fevers in the ED not improving with oral Cipro which was started for possible UTI.  She was subsequently admitted and started on IV Zosyn and finished abx  Psychiatry was consulted. Palliative care following   she is not a reliable historian, continue to have intermittent hallucination   Assessment & Plan:   UTI: Present on admission -Cultures negative , finished  Zosyn.  Patient was on Cipro for few days prior to that.  -no more fever    AKI on CKDII, likely prerenal due to poor intake Cr peaked to 1.23, down to 0.7 after hydration  -Encourage oral intake, start Ensure, oral intake remain poor, continue hydration   Hypokalemia, replace k  Epigastric pain? 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  Continue Seroquel along with trazodone nightly as needed for sleep.  Prozac/hydroxyzine/Geodon discontinued by psychiatry. -Fall precautions.  Delirium precautions.  blood culture no growth    Chronic pain pain for 7 months per husband Status post bilateral total hip arthroplasty. Mri thoracic and lumber spine from 12/18 reviewed  "Chronic appearing compression  fracture of L3 with approximately 40% vertebral body height loss anteriorly.  there is no high-grade spinal canal stenosis in the thoracic or lumbar spine. Moderate bilateral neural foraminal narrowing at T1-T2"  Dr Moshe Cipro consulted, s/p kyphoplasty on 1/5      Hypertension -start propranolol   Hypothyroidism -Continue levothyroxine  Hyperlipidemia -Continue statin  Diabetes mellitus type 2 -Last A1c 5.7.  Blood sugars currently stable.   OSA -Continue CPAP nightly   Goals of care -Palliative care consultation appreciated.  Patient remains full code.  Physical deconditioning -PT eval pending.  TOC following for SNF placement. Currently medically stable for discharge to SNF Difficult placement , husband is not able take care of her  DVT prophylaxis: Start Lovenox Code Status: Full Family Communication: spoke to husband at bedside on 1/3 and 1/5, husband can not provide adequate care for patient at home, currently husband and patient is agreeing to go to rehab facility  Disposition Plan:  Unsafe disposition Poor oral intake, hallucination, anxiety,    Consultants:  psychiatry , palliative care, Ortho  Procedures: Kyphoplasty  Antimicrobials:  Anti-infectives (From admission, onward)    Start     Dose/Rate Route Frequency Ordered Stop   01/17/22 0400  vancomycin (VANCOCIN) IVPB 1000 mg/200 mL premix        1,000 mg 200 mL/hr over 60 Minutes Intravenous Every 12 hours 01/16/22 1940 01/17/22 0925   01/16/22 1620  vancomycin (VANCOCIN) 1-5 GM/200ML-% IVPB       Note to Pharmacy: [name] R: cabinet override      01/16/22 1620 01/17/22 0429   01/10/22 2000  piperacillin-tazobactam (ZOSYN) IVPB 3.375 g  Status:  Discontinued  3.375 g 12.5 mL/hr over 240 Minutes Intravenous Every 8 hours 01/10/22 1405 01/13/22 0749   01/10/22 1415  piperacillin-tazobactam (ZOSYN) IVPB 3.375 g        3.375 g 100 mL/hr over 30 Minutes Intravenous  Once 01/10/22 1408  01/10/22 1538   01/10/22 1330  ciprofloxacin (CIPRO) IVPB 400 mg  Status:  Discontinued        400 mg 200 mL/hr over 60 Minutes Intravenous  Once 01/10/22 1327 01/10/22 1342   01/08/22 0330  ciprofloxacin (CIPRO) tablet 500 mg  Status:  Discontinued        500 mg Oral 2 times daily 01/08/22 0317 01/10/22 1405        Subjective:  S/p kyphoplasty on 1/5  She continues to have intermittent hallucination, and  has poor oral intake, she is not a reliable historian    She is sitting up in chair  Objective: Vitals:   01/17/22 1434 01/18/22 0542 01/18/22 1350 01/18/22 1414  BP: (!) 110/44 (!) 153/80 (!) 105/49 134/74  Pulse: (!) 102 (!) 102 (!) 106 (!) 113  Resp: '18 16 16 16  '$ Temp: 98.3 F (36.8 C) (!) 97.5 F (36.4 C) (!) 97.5 F (36.4 C) 97.6 F (36.4 C)  TempSrc: Oral Oral Oral Oral  SpO2: 100% 100% 98% 99%  Weight:      Height:        Intake/Output Summary (Last 24 hours) at 01/18/2022 1458 Last data filed at 01/18/2022 1400 Gross per 24 hour  Intake 805.22 ml  Output 675 ml  Net 130.22 ml   Filed Weights   12/29/21 0908 01/16/22 1453  Weight: 87.5 kg 87.5 kg    Examination:  General: alert, awake,  intermittent anxiety, hallucination.   respiratory: Clear to auscultation bilaterally CVS: S1-S2 heard; rate controlled currently abdominal: Nontender ,ab  soft, no guarding, no rigidity , bowel sounds heard normally  extremities: No cyanosis; no edema    Data Reviewed: I have personally reviewed following labs and imaging studies  CBC: Recent Labs  Lab 01/15/22 0846 01/17/22 0904  WBC 7.4 10.6*  NEUTROABS 4.6 8.8*  HGB 13.4 13.8  HCT 39.4 41.4  MCV 88.5 90.2  PLT 268 160   Basic Metabolic Panel: Recent Labs  Lab 01/15/22 0846 01/17/22 0904 01/18/22 0851  NA 137 139 141  K 3.0* 3.7 3.2*  CL 105 108 110  CO2 '22 23 22  '$ GLUCOSE 126* 122* 95  BUN 8 5* 6*  CREATININE 0.84 0.77 0.81  CALCIUM 9.1 9.5 9.4  MG 2.0 2.3  --   PHOS  --  3.2  --     GFR: Estimated Creatinine Clearance: 62.9 mL/min (by C-G formula based on SCr of 0.81 mg/dL). Liver Function Tests: Recent Labs  Lab 01/15/22 0846  AST 17  ALT 15  ALKPHOS 59  BILITOT 0.7  PROT 6.1*  ALBUMIN 3.4*   No results for input(s): "LIPASE", "AMYLASE" in the last 168 hours. No results for input(s): "AMMONIA" in the last 168 hours. Coagulation Profile: No results for input(s): "INR", "PROTIME" in the last 168 hours. Cardiac Enzymes: No results for input(s): "CKTOTAL", "CKMB", "CKMBINDEX", "TROPONINI" in the last 168 hours. BNP (last 3 results) No results for input(s): "PROBNP" in the last 8760 hours. HbA1C: No results for input(s): "HGBA1C" in the last 72 hours. CBG: Recent Labs  Lab 01/16/22 1417 01/16/22 1836  GLUCAP 97 109*   Lipid Profile: No results for input(s): "CHOL", "HDL", "LDLCALC", "TRIG", "CHOLHDL", "LDLDIRECT" in the last  72 hours. Thyroid Function Tests: No results for input(s): "TSH", "T4TOTAL", "FREET4", "T3FREE", "THYROIDAB" in the last 72 hours. Anemia Panel: No results for input(s): "VITAMINB12", "FOLATE", "FERRITIN", "TIBC", "IRON", "RETICCTPCT" in the last 72 hours. Sepsis Labs: No results for input(s): "PROCALCITON", "LATICACIDVEN" in the last 168 hours.   Recent Results (from the past 240 hour(s))  Urine Culture     Status: None   Collection Time: 01/10/22 11:15 AM   Specimen: In/Out Cath Urine  Result Value Ref Range Status   Specimen Description   Final    IN/OUT CATH URINE Performed at [facility], [place] [address] [place], [place] [address]    Special Requests   Final    NONE Performed at [facility], [place] [address], [place], [place] [address]    Culture   Final    NO GROWTH Performed at [facility], [place] [address], [place], [place] [address]    Report Status 01/11/2022 FINAL  Final  Urine Culture     Status: None   Collection Time: 01/14/22  4:30 PM   Specimen: Urine,  Clean Catch  Result Value Ref Range Status   Specimen Description   Final    URINE, CLEAN CATCH Performed at [facility], [place] [address] [place], [place] [address]    Special Requests   Final    NONE Performed at [facility], [place] [address], [place], [place] [address]    Culture   Final    NO GROWTH Performed at [facility], [place] [address], [place], [place] [address]    Report Status 01/15/2022 FINAL  Final  Culture, blood (Routine X 2) w Reflex to ID Panel     Status: None (Preliminary result)   Collection Time: 01/15/22 11:54 AM   Specimen: BLOOD LEFT ARM  Result Value Ref Range Status   Specimen Description   Final    BLOOD LEFT ARM Performed at [place] [address], [place], [place] [address]    Special Requests   Final    BOTTLES DRAWN AEROBIC ONLY Blood Culture adequate volume Performed at [place] [address], [place], [place] [address]    Culture   Final    NO GROWTH 3 DAYS Performed at [facility], [place] [address], [place], [place] [address]    Report Status PENDING  Incomplete  Culture, blood (Routine X 2) w Reflex to ID Panel     Status: None (Preliminary result)   Collection Time: 01/15/22 11:54 AM   Specimen: BLOOD RIGHT HAND  Result Value Ref Range Status   Specimen Description   Final    BLOOD RIGHT HAND Performed at [place] [address], [place], [place] [address]    Special Requests   Final    BOTTLES DRAWN AEROBIC ONLY Blood Culture adequate volume Performed at [place] [address] [place], [place] [address]    Culture   Final    NO GROWTH 3 DAYS Performed at [facility], [place] [address], [place], [place] [address]    Report Status PENDING  Incomplete  Surgical PCR screen     Status: None   Collection Time: 01/16/22  2:21 PM   Specimen: Nasal Mucosa; Nasal Swab  Result Value Ref  Range Status   MRSA, PCR NEGATIVE NEGATIVE Final   Staphylococcus aureus NEGATIVE NEGATIVE Final    Comment: (NOTE) The  Xpert SA Assay (FDA approved for NASAL specimens in patients 28 years of age and older), is one component of a comprehensive surveillance program. It is not intended to diagnose infection nor to guide or monitor treatment. Performed at [facility], [place] [address] [place], [place] [address]          Radiology Studies: DG Lumbar Spine 2-3 Views  Result Date: 01/16/2022 CLINICAL DATA:  L3 kyphoplasty EXAM: LUMBAR SPINE - 2-3 VIEW COMPARISON:  12/29/2021 FINDINGS: A single fluoroscopic images obtained during the performance of the procedure and is provided for interpretation. Vertebral augmentation is seen at the L3 level. Please refer to operative report. Fluoroscopy time: 1.0 minute, 28.932 mGy IMPRESSION: 1. Intraoperative evaluation as above. Electronically Signed   By: Randa Ngo M.D.   On: 01/16/2022 18:33   DG C-Arm 1-60 Min-No Report  Result Date: 01/16/2022 Fluoroscopy was utilized by the requesting physician.  No radiographic interpretation.   DG C-Arm 1-60 Min-No Report  Result Date: 01/16/2022 Fluoroscopy was utilized by the requesting physician.  No radiographic interpretation.   DG C-Arm 1-60 Min-No Report  Result Date: 01/16/2022 Fluoroscopy was utilized by the requesting physician.  No radiographic interpretation.   DG C-Arm 1-60 Min-No Report  Result Date: 01/16/2022 Fluoroscopy was utilized by the requesting physician.  No radiographic interpretation.        Scheduled Meds:  enoxaparin (LOVENOX) injection  40 mg Subcutaneous Q24H   feeding supplement  237 mL Oral BID BM   levothyroxine  137 mcg Oral Q0600   mupirocin ointment  1 Application Nasal BID   pantoprazole  20 mg Oral Daily   potassium chloride  40 mEq Oral Daily   propranolol  10 mg Oral TID   QUEtiapine  100 mg Oral QHS   QUEtiapine  25 mg Oral BID    rosuvastatin  40 mg Oral Daily   senna-docusate  1 tablet Oral QHS   tamsulosin  0.8 mg Oral QHS   Continuous Infusions:  sodium chloride     methocarbamol (ROBAXIN) IV          Florencia Reasons, MD PhD FACP [facility] 01/18/2022, 2:58 PM

## 2022-01-18 NOTE — Progress Notes (Signed)
Danielle [name] was bladder scanned, there was 177 ml. Spoke with Dr. Erlinda Hong, she stated she didn't need  IN and out cath.

## 2022-01-18 NOTE — Progress Notes (Signed)
Bladder scan noted to be at 469 mls, patient has been urinating and fully soaked the bed pre and post bladder scan.  At 2300 noted 200 mls of clear urine on her canister collected via purewick.

## 2022-01-19 ENCOUNTER — Other Ambulatory Visit: Payer: Self-pay | Admitting: Behavioral Health

## 2022-01-19 ENCOUNTER — Encounter (HOSPITAL_COMMUNITY): Payer: Self-pay | Admitting: Orthopedic Surgery

## 2022-01-19 DIAGNOSIS — F410 Panic disorder [episodic paroxysmal anxiety] without agoraphobia: Secondary | ICD-10-CM

## 2022-01-19 DIAGNOSIS — R4189 Other symptoms and signs involving cognitive functions and awareness: Secondary | ICD-10-CM | POA: Diagnosis not present

## 2022-01-19 DIAGNOSIS — F333 Major depressive disorder, recurrent, severe with psychotic symptoms: Secondary | ICD-10-CM

## 2022-01-19 DIAGNOSIS — F411 Generalized anxiety disorder: Secondary | ICD-10-CM

## 2022-01-19 LAB — CBC WITH DIFFERENTIAL/PLATELET
Abs Immature Granulocytes: 0.02 10*3/uL (ref 0.00–0.07)
Basophils Absolute: 0 10*3/uL (ref 0.0–0.1)
Basophils Relative: 1 %
Eosinophils Absolute: 0.6 10*3/uL — ABNORMAL HIGH (ref 0.0–0.5)
Eosinophils Relative: 14 %
HCT: 39.2 % (ref 36.0–46.0)
Hemoglobin: 12.8 g/dL (ref 12.0–15.0)
Immature Granulocytes: 0 %
Lymphocytes Relative: 22 %
Lymphs Abs: 1 10*3/uL (ref 0.7–4.0)
MCH: 29.9 pg (ref 26.0–34.0)
MCHC: 32.7 g/dL (ref 30.0–36.0)
MCV: 91.6 fL (ref 80.0–100.0)
Monocytes Absolute: 0.6 10*3/uL (ref 0.1–1.0)
Monocytes Relative: 14 %
Neutro Abs: 2.3 10*3/uL (ref 1.7–7.7)
Neutrophils Relative %: 49 %
Platelets: 216 10*3/uL (ref 150–400)
RBC: 4.28 MIL/uL (ref 3.87–5.11)
RDW: 13.7 % (ref 11.5–15.5)
WBC: 4.7 10*3/uL (ref 4.0–10.5)
nRBC: 0 % (ref 0.0–0.2)

## 2022-01-19 LAB — BASIC METABOLIC PANEL
Anion gap: 8 (ref 5–15)
BUN: 7 mg/dL — ABNORMAL LOW (ref 8–23)
CO2: 23 mmol/L (ref 22–32)
Calcium: 8.9 mg/dL (ref 8.9–10.3)
Chloride: 108 mmol/L (ref 98–111)
Creatinine, Ser: 0.8 mg/dL (ref 0.44–1.00)
GFR, Estimated: >60 mL/min (ref >60)
Glucose, Bld: 117 mg/dL — ABNORMAL HIGH (ref 70–99)
Potassium: 3.6 mmol/L (ref 3.5–5.1)
Sodium: 139 mmol/L (ref 135–145)

## 2022-01-19 MED ORDER — HEPARIN (PORCINE) 25000 UT/250ML-% IV SOLN
INTRAVENOUS | Status: AC
  Filled 2022-01-19: qty 250

## 2022-01-19 NOTE — Care Management Important Message (Signed)
Important Message  Patient Details IM Letter given. Name: Danielle [name] MRN: [id number] Date of Birth: 07/26/1945   Medicare Important Message Given:  Yes     Kerin Salen 01/19/2022, 9:50 AM

## 2022-01-19 NOTE — Progress Notes (Signed)
PROGRESS NOTE    Danielle [name]  WNI:[id number] DOB: 1945-06-29 DOA: 12/29/2021 PCP: [name], MD   Brief Narrative:   77 y.o. female with medical history significant of CAD, pseudo-dementia, depression/anxiety, DM2, HLD, HTN, hypothyroidism, GERD who was in the ED psych hold for 12-days due to altered mental status with visual/auditory hallucinations and was evaluated by psych and found to not qualify for inpatient admission and TOC was working on facility placement.    She was having intermittent fevers in the ED not improving with oral Cipro which was started for possible UTI.  She was subsequently admitted and started on IV Zosyn and finished abx  Psychiatry was consulted. Palliative care following   she is not a reliable historian, continue to have intermittent hallucination   Assessment & Plan:   UTI with fever : Present on admission -Cultures negative , Patient was on Cipro for few days prior to that, finished  Zosyn after admission -no more fever    AKI on CKDII, likely prerenal due to poor intake Cr peaked to 1.23, down to 0.7 after hydration  -Encourage oral intake, start Ensure   Hypokalemia, replace k  Epigastric pain? 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 -Fall precautions.  Delirium precautions.  -blood culture no growth     Chronic pain pain for 7 months per husband H/o  bilateral total hip arthroplasty. Mri thoracic and lumber spine from 12/18 reviewed  "Chronic appearing compression fracture of L3 with approximately  40% vertebral body height loss anteriorly.  there is no high-grade spinal canal stenosis in the thoracic or lumbar spine. Moderate bilateral neural foraminal narrowing at T1-T2"  Dr Moshe Cipro consulted, s/p kyphoplasty on 1/5    Left lower extremity edema Will get venus  doppler left lower extremity to rule out DVT  Hypertension -start propranolol , also will help anxiety   Hypothyroidism -Continue levothyroxine  Hyperlipidemia -Continue statin  Diabetes mellitus type 2 -Last A1c 5.7.  Blood sugars currently stable.   OSA -Continue CPAP nightly   Goals of care -Palliative care consultation appreciated.  Patient remains full code.  Physical deconditioning -SNF placement. Currently medically stable for discharge to SNF Difficult placement , husband is not able take care of her  DVT prophylaxis: Start Lovenox Code Status: Full Family Communication: spoke to husband at bedside on 1/3 and 1/5 and over the phone on 1/8 husband can not provide adequate care for patient at home, currently husband and patient is agreeing to go to rehab facility, f/u on venous doppler result, awaiting for snf bed, difficult placement   Disposition Plan:  Unsafe disposition Poor oral intake, hallucination, anxiety,    Consultants:  psychiatry , palliative care, Ortho  Procedures: Kyphoplasty  Antimicrobials:  Anti-infectives (From admission, onward)    Start     Dose/Rate Route Frequency Ordered Stop   01/17/22 0400  vancomycin (VANCOCIN) IVPB 1000 mg/200 mL premix        1,000 mg 200 mL/hr over 60 Minutes Intravenous Every 12 hours 01/16/22 1940 01/17/22 0925   01/16/22 1620  vancomycin (VANCOCIN) 1-5 GM/200ML-% IVPB       Note to Pharmacy: [name] R:  cabinet override      01/16/22 1620 01/17/22 0429   01/10/22 2000  piperacillin-tazobactam (ZOSYN) IVPB 3.375 g  Status:  Discontinued        3.375 g 12.5 mL/hr over 240 Minutes Intravenous Every 8 hours 01/10/22 1405 01/13/22 0749    01/10/22 1415  piperacillin-tazobactam (ZOSYN) IVPB 3.375 g        3.375 g 100 mL/hr over 30 Minutes Intravenous  Once 01/10/22 1408 01/10/22 1538   01/10/22 1330  ciprofloxacin (CIPRO) IVPB 400 mg  Status:  Discontinued        400 mg 200 mL/hr over 60 Minutes Intravenous  Once 01/10/22 1327 01/10/22 1342   01/08/22 0330  ciprofloxacin (CIPRO) tablet 500 mg  Status:  Discontinued        500 mg Oral 2 times daily 01/08/22 0317 01/10/22 1405        Subjective:  S/p kyphoplasty on 1/5  She continues to have intermittent hallucination, and  has poor oral intake, she is not a reliable historian      Objective: Vitals:   01/19/22 0254 01/19/22 0517 01/19/22 1536 01/19/22 1539  BP: 117/88 (!) 150/96 (!) 87/67 (!) 117/99  Pulse: 84 97 (!) 129 79  Resp: '14  17 18  '$ Temp: 97.8 F (36.6 C) 97.6 F (36.4 C)    TempSrc: Oral Oral    SpO2: 96% 95% 95%   Weight:      Height:        Intake/Output Summary (Last 24 hours) at 01/19/2022 1736 Last data filed at 01/19/2022 1723 Gross per 24 hour  Intake 2061.55 ml  Output 2758 ml  Net -696.45 ml   Filed Weights   12/29/21 0908 01/16/22 1453  Weight: 87.5 kg 87.5 kg    Examination:  General: alert, awake,  intermittent anxiety, hallucination.   respiratory: Clear to auscultation bilaterally CVS: S1-S2 heard; rate controlled currently abdominal: Nontender ,ab  soft, no guarding, no rigidity , bowel sounds heard normally  extremities: No cyanosis; slight left lateral pedal/ankle/leg edema ( dependent edema?)   Data Reviewed: I have personally reviewed following labs and imaging studies  CBC: Recent Labs  Lab 01/15/22 0846 01/17/22 0904 01/19/22 0816  WBC 7.4 10.6* 4.7  NEUTROABS 4.6 8.8* 2.3  HGB 13.4 13.8 12.8  HCT 39.4 41.4 39.2  MCV 88.5 90.2 91.6  PLT 268 276 814   Basic Metabolic Panel: Recent Labs  Lab 01/15/22 0846 01/17/22 0904 01/18/22 0851 01/19/22 0816  NA 137 139 141 139  K 3.0* 3.7 3.2* 3.6  CL 105  108 110 108  CO2 '22 23 22 23  '$ GLUCOSE 126* 122* 95 117*  BUN 8 5* 6* 7*  CREATININE 0.84 0.77 0.81 0.80  CALCIUM 9.1 9.5 9.4 8.9  MG 2.0 2.3  --   --   PHOS  --  3.2  --   --    GFR: Estimated Creatinine Clearance: 63.7 mL/min (by C-G formula based on SCr of 0.8 mg/dL). Liver Function Tests: Recent Labs  Lab 01/15/22 0846  AST 17  ALT 15  ALKPHOS 59  BILITOT 0.7  PROT 6.1*  ALBUMIN 3.4*   No results for input(s): "LIPASE", "AMYLASE" in the last 168 hours. No results for input(s): "AMMONIA" in the last 168 hours. Coagulation Profile: No results for input(s): "INR", "PROTIME" in the last 168 hours. Cardiac Enzymes: No results for input(s): "CKTOTAL", "CKMB", "CKMBINDEX", "TROPONINI" in the last 168 hours. BNP (last 3 results) No results for input(s): "PROBNP" in  the last 8760 hours. HbA1C: No results for input(s): "HGBA1C" in the last 72 hours. CBG: Recent Labs  Lab 01/16/22 1417 01/16/22 1836  GLUCAP 97 109*   Lipid Profile: No results for input(s): "CHOL", "HDL", "LDLCALC", "TRIG", "CHOLHDL", "LDLDIRECT" in the last 72 hours. Thyroid Function Tests: No results for input(s): "TSH", "T4TOTAL", "FREET4", "T3FREE", "THYROIDAB" in the last 72 hours. Anemia Panel: No results for input(s): "VITAMINB12", "FOLATE", "FERRITIN", "TIBC", "IRON", "RETICCTPCT" in the last 72 hours. Sepsis Labs: No results for input(s): "PROCALCITON", "LATICACIDVEN" in the last 168 hours.   Recent Results (from the past 240 hour(s))  Urine Culture     Status: None   Collection Time: 01/10/22 11:15 AM   Specimen: In/Out Cath Urine  Result Value Ref Range Status   Specimen Description   Final    IN/OUT CATH URINE Performed at [facility], [place] [address] [place], [place] [address]    Special Requests   Final    NONE Performed at [facility], [place] [address] [place], [place] [address]    Culture   Final    NO GROWTH Performed at [place], [place] [address], [place], [place] [address]    Report Status 01/11/2022 FINAL  Final  Urine Culture     Status: None   Collection Time: 01/14/22  4:30 PM   Specimen: Urine, Clean Catch  Result Value Ref Range Status   Specimen Description   Final    URINE, CLEAN CATCH Performed at [facility], [place] [address] [place], [place] [address]    Special Requests   Final    NONE Performed at [facility], [place] [address] [place], [place] [address]    Culture   Final    NO GROWTH Performed at [facility], [place] [address] [place], [place] [address]    Report Status 01/15/2022 FINAL  Final  Culture, blood (Routine X 2) w Reflex to ID Panel     Status: None (Preliminary result)   Collection Time: 01/15/22 11:54 AM   Specimen: BLOOD LEFT ARM  Result Value Ref Range Status   Specimen Description   Final    BLOOD LEFT ARM Performed at [place] [address], [place], [place] [address]    Special Requests   Final    BOTTLES DRAWN AEROBIC ONLY Blood Culture adequate volume Performed at [place] [address] [place], [place] [address]    Culture   Final    NO GROWTH 4 DAYS Performed at [facility], [place] [address] [place], [place] [address]    Report Status PENDING  Incomplete  Culture, blood (Routine X 2) w Reflex to ID Panel     Status: None (Preliminary result)   Collection Time: 01/15/22 11:54 AM   Specimen: BLOOD RIGHT HAND  Result Value Ref Range Status   Specimen Description   Final    BLOOD RIGHT HAND Performed at [place] [address], [place], [place] [address]    Special Requests   Final    BOTTLES DRAWN AEROBIC ONLY Blood Culture adequate volume Performed at [place] [address] [place], [place] [address]    Culture   Final    NO GROWTH 4 DAYS Performed at [facility], [place] [address] [place], [place]  [address]    Report Status PENDING  Incomplete  Surgical PCR screen  Status: None   Collection Time: 01/16/22  2:21 PM   Specimen: Nasal Mucosa; Nasal Swab  Result Value Ref Range Status   MRSA, PCR NEGATIVE NEGATIVE Final   Staphylococcus aureus NEGATIVE NEGATIVE Final    Comment: (NOTE) The Xpert SA Assay (FDA approved for NASAL specimens in patients 18 years of age and older), is one component of a comprehensive surveillance program. It is not intended to diagnose infection nor to guide or monitor treatment. Performed at [facility], [place] [address] [place], [place] [address]          Radiology Studies: No results found.      Scheduled Meds:  enoxaparin (LOVENOX) injection  40 mg Subcutaneous Q24H   feeding supplement  237 mL Oral BID BM   levothyroxine  137 mcg Oral Q0600   mupirocin ointment  1 Application Nasal BID   pantoprazole  20 mg Oral Daily   potassium chloride  40 mEq Oral Daily   propranolol  10 mg Oral TID   QUEtiapine  100 mg Oral QHS   QUEtiapine  25 mg Oral BID   rosuvastatin  40 mg Oral Daily   senna-docusate  1 tablet Oral QHS   tamsulosin  0.8 mg Oral QHS   Continuous Infusions:  methocarbamol (ROBAXIN) IV          Florencia Reasons, MD PhD FACP [facility] 01/19/2022, 5:36 PM

## 2022-01-19 NOTE — Progress Notes (Signed)
    Subjective: Procedure(s) (LRB): L-3 COMPRESSION FRACTURE WITH KYPHOPLASTY (N/A) 3 Days Post-Op  Patient reports pain as 3 on 0-10 scale.  Reports decreased leg pain reports incisional back pain   Positive void Positive bowel movement Positive flatus Negative chest pain or shortness of breath  Objective: Vital signs in last 24 hours: Temp:  [97.6 F (36.4 C)-98.4 F (36.9 C)] 97.6 F (36.4 C) (01/08 0517) Pulse Rate:  [79-129] 79 (01/08 1539) Resp:  [14-18] 18 (01/08 1539) BP: (87-150)/(67-99) 117/99 (01/08 1539) SpO2:  [93 %-96 %] 95 % (01/08 1536)  Intake/Output from previous day: 01/07 0701 - 01/08 0700 In: 2193.4 [P.O.:674; I.V.:1519.4] Out: 950 [Urine:950]  Labs: Recent Labs    01/17/22 0904 01/19/22 0816  WBC 10.6* 4.7  RBC 4.59 4.28  HCT 41.4 39.2  PLT 276 216   Recent Labs    01/18/22 0851 01/19/22 0816  NA 141 139  K 3.2* 3.6  CL 110 108  CO2 22 23  BUN 6* 7*  CREATININE 0.81 0.80  GLUCOSE 95 117*  CALCIUM 9.4 8.9   No results for input(s): "LABPT", "INR" in the last 72 hours.  Physical Exam: Neurologically intact ABD soft Intact pulses distally Dorsiflexion/Plantar flexion intact Incision: dressing C/D/I and no drainage Compartment soft Body mass index is 34.17 kg/m.   Assessment/Plan: Patient stable  Continue mobilization with physical therapy Continue care  Patient is doing quite well.  Baseline pseudodementia remains an issue. Dressings are clean dry and intact.  No evidence of infection.  Back pain appears to be improved status post kyphoplasty. At this point I will sign off.  If there is any issues or problems please do not hesitate to contact me.   Melina Schools, MD [facility] [phone]

## 2022-01-19 NOTE — Progress Notes (Signed)
Second BP most accurate

## 2022-01-19 NOTE — Plan of Care (Signed)
  Problem: Clinical Measurements: Goal: Will remain free from infection Outcome: Not Progressing   Problem: Coping: Goal: Level of anxiety will decrease Outcome: Not Progressing   Problem: Safety: Goal: Ability to remain free from injury will improve Outcome: Not Progressing

## 2022-01-19 NOTE — Progress Notes (Signed)
Pt urinated in canister 550. Post void residual via bladder scan 608 ml. Dr. Erlinda Hong notified via secure chat.

## 2022-01-19 NOTE — TOC Progression Note (Signed)
Transition of Care [facility]) - Progression Note    Patient Details  Name: Danielle [name] MRN: [id number] Date of Birth: [birth year]  Transition of [facility]) CM/SW [place], RN Phone Number: 01/19/2022, 1:39 PM  Clinical Narrative:    [facility] in [place] is considering this patient for admission. Message has been sent to determine how TOC can assist with this consideration. Awaiting a response.      Barriers to Discharge: Continued Medical Work up  Expected Discharge Plan and Services In-house Referral: NA Discharge Planning Services: CM Consult Post Acute Care Choice: [place] arrangements for the past 2 months: Single Family Home                   DME Agency: NA       HH Arranged: NA [place] Agency: NA         Social Determinants of Health (SDOH) Interventions [place]: No Food Insecurity (01/17/2022)  Housing: Low Risk  (01/17/2022)  Transportation Needs: No Transportation Needs (01/17/2022)  Utilities: Not At Risk (01/17/2022)  Alcohol Screen: Low Risk  (11/26/2021)  Depression (PHQ2-9): High Risk (10/09/2021)  Tobacco Use: Low Risk  (01/16/2022)    Readmission Risk Interventions    10/31/2021    5:06 PM  Readmission Risk Prevention Plan  Transportation Screening Complete  PCP or Specialist Appt within 3-5 Days Complete  HRI or [place] Complete  Social Work Consult for [place] Planning/Counseling Complete  Palliative Care Screening Not Applicable  Medication Review [employer]) Complete

## 2022-01-20 ENCOUNTER — Inpatient Hospital Stay (HOSPITAL_COMMUNITY): Payer: Medicare HMO

## 2022-01-20 DIAGNOSIS — R609 Edema, unspecified: Secondary | ICD-10-CM

## 2022-01-20 DIAGNOSIS — R4189 Other symptoms and signs involving cognitive functions and awareness: Secondary | ICD-10-CM | POA: Diagnosis not present

## 2022-01-20 LAB — MAGNESIUM: Magnesium: 1.6 mg/dL — ABNORMAL LOW (ref 1.7–2.4)

## 2022-01-20 LAB — CULTURE, BLOOD (ROUTINE X 2)
Culture: NO GROWTH
Culture: NO GROWTH
Special Requests: ADEQUATE
Special Requests: ADEQUATE

## 2022-01-20 LAB — BASIC METABOLIC PANEL
Anion gap: 6 (ref 5–15)
BUN: 8 mg/dL (ref 8–23)
CO2: 25 mmol/L (ref 22–32)
Calcium: 9.2 mg/dL (ref 8.9–10.3)
Chloride: 107 mmol/L (ref 98–111)
Creatinine, Ser: 0.8 mg/dL (ref 0.44–1.00)
GFR, Estimated: >60 mL/min (ref >60)
Glucose, Bld: 106 mg/dL — ABNORMAL HIGH (ref 70–99)
Potassium: 4.1 mmol/L (ref 3.5–5.1)
Sodium: 138 mmol/L (ref 135–145)

## 2022-01-20 LAB — PHOSPHORUS: Phosphorus: 3.3 mg/dL (ref 2.5–4.6)

## 2022-01-20 MED ORDER — MAGNESIUM SULFATE 2 GM/50ML IV SOLN
2.0000 g | Freq: Once | INTRAVENOUS | Status: AC
  Administered 2022-01-20: 2 g via INTRAVENOUS
  Filled 2022-01-20: qty 50

## 2022-01-20 NOTE — Assessment & Plan Note (Signed)
Treated by orthopedics - PT eval - Analgesics as needed

## 2022-01-20 NOTE — Assessment & Plan Note (Addendum)
BP normal - Continue propranolol

## 2022-01-20 NOTE — Progress Notes (Addendum)
Physical Therapy Treatment Patient Details Name: Danielle [name] MRN: [id number] DOB: 05-21-1945 Today's Date: 01/20/2022   History of Present Illness Patient is a 77 year old female who presents to the emergency department 12/29/21 with concern for abnormal behaviors, recently in hospital for same.patient noted to have "Chronic appearing compression fracture of L3 with approximately 40%  vertebral body height loss anteriorly.   there is no high-grade spinal canal stenosis in the thoracic or lumbar spine. Moderate bilateral neural foraminal narrowing at T1-T2" .  patient underwent kyphoplasty on 1/5. no brace needed per orders. PMH: pseudodementia, depression, anxiety.    PT Comments    Pt asleep in bed upon arrival, wakens to verbal cues. Pt needing mod A to power up into sitting EOB through log roll technique, increased time and cues for performance. Pt powers to stand with min A, heavy cues and therapist positioning RW further anterior to encourage anterior weight shift. Pt appears hesitant or nervous with transferring, improved temperament when familiar nurse tech entered room, pt also able to take 3 shuffling steps to recliner with min A with nurse tech assisting to verbally cue pt while therapist providing tactile cues. Pt only able to verbalize 2 meaningful sentences, otherwise making repetitive sounds. No facial grimacing noted with bed mobility, pt does shake head "no" when asked if having pain.   Recommendations for follow up therapy are one component of a multi-disciplinary discharge planning process, led by the attending physician.  Recommendations may be updated based on patient status, additional functional criteria and insurance authorization.  Follow Up Recommendations  Skilled nursing-short term rehab (<3 hours/day) Can patient physically be transported by private vehicle: No   Assistance Recommended at Discharge    Patient can return home with the following Two people to help  with walking and/or transfers;Two people to help with bathing/dressing/bathroom;Assistance with cooking/housework;Assistance with feeding;Direct supervision/assist for medications management;Direct supervision/assist for financial management;Assist for transportation;Help with stairs or ramp for entrance   Equipment Recommendations  None recommended by PT    Recommendations for Other Services       Precautions / Restrictions Precautions Precautions: Fall Precaution Comments: dementia, Wernicke's type aphasia, back precautions Required Braces or Orthoses:  (no brace needed per MD) Restrictions Weight Bearing Restrictions: No     Mobility  Bed Mobility Overal bed mobility: Needs Assistance Bed Mobility: Sidelying to Sit  Sidelying to sit: Mod assist  General bed mobility comments: mod A to power up to sitting through log roll technique, heavy cues and significant increased time, no facial grimacing noted    Transfers Overall transfer level: Needs assistance Equipment used: Rolling walker (2 wheels) Transfers: Sit to/from Stand Sit to Stand: Min assist  General transfer comment: min A to steady, heavy multimodal cues for performance, improved standing balance with therapist shifting RW further anterior    Ambulation/Gait Ambulation/Gait assistance: Min assist  Assistive device: Rolling walker (2 wheels) Gait Pattern/deviations: Shuffle Gait velocity: decreased  General Gait Details: pt takes 3 shuffling steps over to recliner with RW, clears feet evenly and minimally with steps, min A to steady, unable to cue for further ambulation distance   Stairs             Wheelchair Mobility    Modified Rankin (Stroke Patients Only)       Balance Overall balance assessment: Needs assistance Sitting-balance support: Feet supported, Bilateral upper extremity supported Sitting balance-Leahy Scale: Fair  Standing balance support: Bilateral upper extremity supported, During  functional activity, Reliant on assistive device for  balance Standing balance-Leahy Scale: Poor     Cognition Arousal/Alertness: Awake/alert Behavior During Therapy: Flat affect Overall Cognitive Status: No family/caregiver present to determine baseline cognitive functioning   General Comments: pt making repetitive sounds, able to verbalize "I can't" and "I can't do that", follows 1 step commands inconsistently, needs increased multimodal cues, responds better to familiar nurse tech cues than therapist        Exercises      General Comments        Pertinent Vitals/Pain Pain Assessment Pain Assessment: No/denies pain    Home Living                          Prior Function            PT Goals (current goals can now be found in the care plan section) Acute Rehab PT Goals PT Goal Formulation: Patient unable to participate in goal setting Time For Goal Achievement: 01/31/22 Potential to Achieve Goals: Fair Progress towards PT goals: Progressing toward goals    Frequency    Min 2X/week      PT Plan Current plan remains appropriate    Co-evaluation              AM-PAC PT "6 Clicks" Mobility   Outcome Measure  Help needed turning from your back to your side while in a flat bed without using bedrails?: A Little Help needed moving from lying on your back to sitting on the side of a flat bed without using bedrails?: A Lot Help needed moving to and from a bed to a chair (including a wheelchair)?: A Little Help needed standing up from a chair using your arms (e.g., wheelchair or bedside chair)?: A Little Help needed to walk in hospital room?: A Lot Help needed climbing 3-5 steps with a railing? : Total 6 Click Score: 14    End of Session Equipment Utilized During Treatment: Gait belt Activity Tolerance: Patient tolerated treatment well Patient left: in chair;with call bell/phone within reach;with chair alarm set Nurse Communication: Mobility  status PT Visit Diagnosis: Unsteadiness on feet (R26.81);Difficulty in walking, not elsewhere classified (R26.2)     Time: 3568-6168 PT Time Calculation (min) (ACUTE ONLY): 27 min  Charges:  $Therapeutic Activity: 23-37 mins                      Tori Camille Dragan PT, DPT 01/20/22, 1:14 PM

## 2022-01-20 NOTE — Assessment & Plan Note (Addendum)
Urine culture with multiple species. Repeat on admission no growth, repeat again during hospitalization no growth.  No recurrent symptoms. - Continue Flomax

## 2022-01-20 NOTE — Assessment & Plan Note (Signed)
Continue Crestor 

## 2022-01-20 NOTE — Assessment & Plan Note (Signed)
-   Continue Seroquel, trazodone

## 2022-01-20 NOTE — Progress Notes (Signed)
  Progress Note   Patient: Danielle [name] YQI:[id number] DOB: March 18, 1945 DOA: 12/29/2021     5 DOS: the patient was seen and examined on 01/20/2022 at 11:12AM      Brief hospital course: Danielle [name] is a 77 y.o. F with obesity BMI 34, OSA on CPAP, hypothyroidism, HTN, CAD, depression and pseudodementia who presented with erratic behavior.  12/18: Initially presented with erratic behavior 12/19-20: Psychiatry evaluated, recommended discharge to SNF or memory care 12/27: Urine culture sent for no apparent reason, started Cipro 12/29-30: Patient with temp 100.15F, hospitalists consulted for "fever"   12/27: Urine culture multiple species 12/30: Repeat urine culture no growth 1/3: Repeat urine culture no growth 1/4: Blood cultures x2 no growth     Assessment and Plan: * Pseudodementia Stable - PT - TOC consult for placement - Continue Seroquel, propranolol, trazodone     AKI (acute kidney injury) ([place]) Cr 1.2 on admisison, improved to baseline 0.8 with fluids.  Hypomagnesemia Supplemented  Lumbar compression fracture (HCC) Treated by orthopedics - PT eval - Analgesics as needed  Purported UTI Urine culture with multiple species. Repeat on admission no growth, repeat again during hospitalization no growth.   Depression with anxiety - Continue Seroquel, trazodone  Hypothyroidism - Continue levothyroxine  Hyperlipidemia LDL goal <70 - Continue Crestor  Hypertension BP normal - Continue propranolol  OSA (obstructive sleep apnea) - Continue CPAP at night          Subjective: No nursing concerns.  No new fever, respiratory symptoms.  Patient nonverbal with me.     Physical Exam: BP (!) 111/59 (BP Location: Right Arm)   Pulse 88   Temp 97.7 F (36.5 C) (Oral)   Resp 18   Ht '5\' 3"'$  (1.6 m)   Wt 87.5 kg   SpO2 98%   BMI 34.17 kg/m   Elderly adult female, lying in bed, no acute distress RRR, no murmurs, no peripheral edema Respiratory rate  normal, lungs clear without rales or wheezes Abdomen soft without grimace to palpation Keeps eyes closed during our conversation, I observed later that when distracted, she opens eyes and observes physical therapist.  She mumbles incoherently at times, other times speaks lucid sentences.  Strength equal in upper and lower extremities bilaterally, face symmetric.     Data Reviewed: Patient metabolic panel normal Complete blood count Normal yesterday  Family Communication: None present    Disposition: Status is: Inpatient Patient presented to the hospital for erratic behavior due to her dementia.  She was thought to have a UTI during her ER stay, and so was admitted to the hospital, but this was treated she is now stable.  Medically ready for discharge when safe dispposition is arranged.        Author: Edwin Dada, MD 01/20/2022 4:12 PM  For on call review [email or web address].

## 2022-01-20 NOTE — Assessment & Plan Note (Signed)
Supplemented 

## 2022-01-20 NOTE — Hospital Course (Addendum)
Danielle [name] is a 77 y.o. F with obesity BMI 34, OSA on CPAP, hypothyroidism, HTN, CAD, depression and pseudodementia who presented with erratic behavior.   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, transferred to [facility] 11/16-11/22: Admitted at [facility] for pseudodementia, discharged to home   12/18: Returned, again with erratic behavior 12/19-20: Psychiatry evaluated, unable to review Neuro notes regarding prior Neuropsych testing, instead, impression was of "cognitive decline", and recommendation was simply for discharge to memory care 12/27: Urine culture sent for no apparent reason, started Cipro 12/29-30: Patient with temp 100.23F, hospitalists consulted for "fever"  12/30: Patient admitted to acute care 12/31: Psychiatry reconsulted, unable to read outpatient Neuro notes, diagnoses "delirium"       12/27: Urine culture multiple species 12/30: Repeat urine culture no growth 1/3: Repeat urine culture no growth 1/4: Blood cultures x2 no growth

## 2022-01-20 NOTE — Assessment & Plan Note (Addendum)
Patient with a decline in cognitive function in the last year.  Initially referred to behavioral health and Neurology.  Neurology obtained neurocognitive testing (see excellent summary in Dr. Cathren Laine note from 10/16/21) and her neurocognitive testing did NOT show deficits in those areas of function which would be expected to deteriorate first in a cortical dementia such as Alzheimer's or Lewy body dementia.  Work up for subcortical dementias with MRI brain (in Dec 2023), vitamin B1, homocysteine, MMA, ammonia, RPR, D, and vit B12 levels were also normal.  She was perceived by the Neurology team to have pseudodementia in the setting of her anxiety and depression.  Concurrent care by her Psychiatrist appears to have been fragmented by frequent visits to the ER and hospital during which medications were changed.  During her last hospitalization in Nov 2023, the patient was evaluated by Psychiatry while inpatient, diagnosed with pseudodementia, discharged to [facility], where she was started on Prozac, Zyprexa and Remeron.  During that 6 day admission, she was evidently more cognitively alert: "Cognitively, she would have a normal conversation with you but then would speak in a tangential way that had nothing to do with the conversation. It was very odd. She was alert and oriented x 3 and pleasant and cooperative but would go into these episodes of being confused." And eventually she was discharged home.     She was home about 3 weeks and then behaviors became erratic again and so family brought her back to the ER.    On return, her medical work up was normal in the ER.  She was evaluated by psychiatry again, and my understanding of their notes is that they did not review her prior neurological work up.  Their brief notes use the term "cognitive decline" and their recommendations imply they believed she had dementia.  When she was eventually admitted for UTI, repeat evaluation by Psychiatry again implies that  they were unable to review the excellent Neurology notes by Dr. Lavell Anchors referenced above, but instead had the impression that she was having delirium, and recommended medical management alone (and stopping Prozac, etc for being deliriogenic)  Her clinical course over the last 29 days is not consistent with delirium.  It has not improved with treatment of underlying medical issues, and in my experience, it has not been at all a fluctuating inattention and disorientation, but instead seems very much like described above, she would make clear sense, and then within moments devolve into gibberish and random words.  Regardless, at this point, although it seems clear she does not have an organic neurodegenerative disease or reversible medical cause of dementia, she has not been able to return to a level where she can cognitively function at home despite hospitalization in a geriatric psychiatric facility and stabilization on appropriate psychiatric medications, and I agree that referral to SNF with possible memory care capabilities is best.  - PT - TOC consult for placement - Continue Seroquel, propranolol, trazodone at discharge

## 2022-01-20 NOTE — TOC Progression Note (Signed)
Transition of Care [facility]) - Progression Note    Patient Details  Name: Danielle [name] MRN: [id number] Date of Birth: 1945-06-26  Transition of Care [facility]) CM/SW Contact  Joaquin Courts, RN Phone Number: 01/20/2022, 2:49 PM  Clinical Narrative:    [facility] supervisor reached out to [facility], report the facility has no open female beds and no anticipated openings as they have a pending wait list of 15 patients. Requested another review by [facility] rehab and left VM for [facility].     Barriers to Discharge: Continued Medical Work up  Expected Discharge Plan and Services In-house Referral: NA Discharge Planning Services: CM Consult Post Acute Care Choice: [place] arrangements for the past 2 months: Single Family Home                   DME Agency: NA       HH Arranged: NA [place] Agency: NA         Social Determinants of Health (SDOH) Interventions [place]: No Food Insecurity (01/17/2022)  Housing: Low Risk  (01/17/2022)  Transportation Needs: No Transportation Needs (01/17/2022)  Utilities: Not At Risk (01/17/2022)  Alcohol Screen: Low Risk  (11/26/2021)  Depression (PHQ2-9): High Risk (10/09/2021)  Tobacco Use: Low Risk  (01/19/2022)    Readmission Risk Interventions    10/31/2021    5:06 PM  Readmission Risk Prevention Plan  Transportation Screening Complete  PCP or Specialist Appt within 3-5 Days Complete  HRI or [place] Complete  Social Work Consult for [place] Planning/Counseling Complete  Palliative Care Screening Not Applicable  Medication Review Press photographer) Complete

## 2022-01-20 NOTE — TOC Progression Note (Signed)
Transition of Care [facility]) - Progression Note    Patient Details  Name: Danielle [name] MRN: [id number] Date of Birth: 1945/05/17  Transition of Care [facility]) CM/SW Contact  Joaquin Courts, RN Phone Number: 01/20/2022, 4:03 PM  Clinical Narrative:    Toc supervisor reached out to [facility] who indicated in [place] that additonal clinical information is required for review.  [facility] rep was unable to clarify what clinical information is needed but stated would look at the original referral and call CM back, awaiting return call.  CM also spoke with [facility] SN[facility] rep and provided information regarding referral.  Rep requests all progress notes from last week as well as complete medication list.  This information was faxed and securely emailed for review.  Rep states will review and update CM if facility can consider patient.     Barriers to Discharge: Continued Medical Work up  Expected Discharge Plan and Services In-house Referral: NA Discharge Planning Services: CM Consult Post Acute Care Choice: [place] arrangements for the past 2 months: Single Family Home                   DME Agency: NA       HH Arranged: NA [place] Agency: NA         Social Determinants of Health (SDOH) Interventions [place]: No Food Insecurity (01/17/2022)  Housing: Low Risk  (01/17/2022)  Transportation Needs: No Transportation Needs (01/17/2022)  Utilities: Not At Risk (01/17/2022)  Alcohol Screen: Low Risk  (11/26/2021)  Depression (PHQ2-9): High Risk (10/09/2021)  Tobacco Use: Low Risk  (01/19/2022)    Readmission Risk Interventions    10/31/2021    5:06 PM  Readmission Risk Prevention Plan  Transportation Screening Complete  PCP or Specialist Appt within 3-5 Days Complete  HRI or [place] Complete  Social Work Consult for [place] Planning/Counseling Complete  Palliative Care Screening Not Applicable  Medication Review Human resources officer) Complete

## 2022-01-20 NOTE — Assessment & Plan Note (Addendum)
During her hospitalization here, the patient has not tolerated CPAP.  In discussion with husband, she was not wearing it at home prior to admission either.   Despite that, she has been medically stable, and showed no ill effects, and no signs of hypercarbia.  It is my medical opinion that given her dementia with behavioral disturbance, it would be exceedingly difficult for her to tolerate CPAP nightly without exacerbating her behavioral problems, and the benefit would be vastly outweighed by this risk. - Stop CPAP

## 2022-01-20 NOTE — Progress Notes (Signed)
Left lower extremity venous duplex has been completed. Preliminary results can be found in CV Proc through chart review.   01/20/22 9:10 AM Danielle [name] RVT

## 2022-01-20 NOTE — Assessment & Plan Note (Signed)
Cr 1.2 on admisison, improved to baseline 0.8 with fluids.

## 2022-01-20 NOTE — Assessment & Plan Note (Signed)
Continue levothyroxine 

## 2022-01-21 DIAGNOSIS — R4189 Other symptoms and signs involving cognitive functions and awareness: Secondary | ICD-10-CM | POA: Diagnosis not present

## 2022-01-21 NOTE — Progress Notes (Signed)
  Progress Note   Patient: Danielle [name] PIR:[id number] DOB: [birth year] DOA: 12/29/2021     6 DOS: the patient was seen and examined on 01/21/2022 at 8:48 AM      Brief hospital course: Mrs. Ruud is a 77 y.o. F with obesity BMI 34, OSA on CPAP, hypothyroidism, HTN, CAD, depression and pseudodementia who presented with erratic behavior.  12/18: Initially presented with erratic behavior 12/19-20: Psychiatry evaluated, recommended discharge to SNF or memory care 12/27: Urine culture sent for no apparent reason, started Cipro 12/29-30: Patient with temp 100.51F, hospitalists consulted for "fever"   12/27: Urine culture multiple species 12/30: Repeat urine culture no growth 1/3: Repeat urine culture no growth 1/4: Blood cultures x2 no growth     Assessment and Plan: * Pseudodementia Stable Prozac and Geodon discontinued by Psychiatry - PT - TOC consult for placement - Continue Seroquel, propranolol, trazodone     AKI (acute kidney injury) ([place]) Cr 1.2 on admisison, improved to baseline 0.8 with fluids.  Hypomagnesemia Supplemented  Lumbar compression fracture (HCC) Treated by orthopedics - PT eval - Analgesics as needed  Purported UTI Urine culture with multiple species. Repeat on admission no growth, repeat again during hospitalization no growth.   Depression with anxiety - Continue Seroquel, trazodone  Hypothyroidism - Continue levothyroxine  Hyperlipidemia LDL goal <70 - Continue Crestor  Hypertension BP normal - Continue propranolol  OSA (obstructive sleep apnea) - Continue CPAP at night          Subjective: No new nursing concerns, no fever, pain complaints.     Physical Exam: BP 115/75 (BP Location: Right Arm)   Pulse 85   Temp 98.3 F (36.8 C) (Oral)   Resp 20   Ht '5\' 3"'$  (1.6 m)   Wt 87.5 kg   SpO2 97%   BMI 34.17 kg/m   Elderly adult female, lying in bed, no acute distress RRR, no murmurs, no peripheral  edema Respiratory rate normal, lungs clear without rales or wheezes Abdomen soft without grimace to palpation Interactive but rambling, moves upper extremities with generalized weakness but symmetric strength     Data Reviewed: No new labs  Family Communication: None present    Disposition: Status is: Inpatient Patient presented to the hospital for erratic behavior due to her dementia.  She was thought to have a UTI during her ER stay, and so was admitted to the hospital, but this was treated she is now stable.  Medically ready for discharge when safe dispposition is arranged.        Author: Edwin Dada, MD 01/21/2022 4:11 PM  For on call review [email or web address].

## 2022-01-21 NOTE — Plan of Care (Addendum)
Patient remains confused yelling out single words repeatedly.  Noticed patient making circular hip rotation repetitive movements throughout shift.  Tried to investigate for past trauma, uta.  Will continue to monitor.  Provider notified.    Problem: Education: Goal: Knowledge of General Education information will improve Description: Including pain rating scale, medication(s)/side effects and non-pharmacologic comfort measures 01/21/2022 0637 by Marjory Sneddon, RN Outcome: Progressing 01/21/2022 0613 by Marjory Sneddon, RN Outcome: Progressing   Problem: Health Behavior/Discharge Planning: Goal: Ability to manage health-related needs will improve 01/21/2022 0637 by Marjory Sneddon, RN Outcome: Progressing 01/21/2022 0613 by Marjory Sneddon, RN Outcome: Progressing   Problem: Clinical Measurements: Goal: Ability to maintain clinical measurements within normal limits will improve 01/21/2022 0637 by Marjory Sneddon, RN Outcome: Progressing 01/21/2022 0613 by Marjory Sneddon, RN Outcome: Progressing Goal: Will remain free from infection 01/21/2022 0637 by Marjory Sneddon, RN Outcome: Progressing 01/21/2022 0613 by Marjory Sneddon, RN Outcome: Progressing Goal: Diagnostic test results will improve 01/21/2022 0637 by Marjory Sneddon, RN Outcome: Progressing 01/21/2022 0613 by Marjory Sneddon, RN Outcome: Progressing Goal: Respiratory complications will improve 01/21/2022 0637 by Marjory Sneddon, RN Outcome: Progressing 01/21/2022 0613 by Marjory Sneddon, RN Outcome: Progressing Goal: Cardiovascular complication will be avoided 01/21/2022 0637 by Marjory Sneddon, RN Outcome: Progressing 01/21/2022 0613 by Marjory Sneddon, RN Outcome: Progressing   Problem: Activity: Goal: Risk for activity intolerance will decrease 01/21/2022 0637 by Marjory Sneddon, RN Outcome: Progressing 01/21/2022 0613 by Marjory Sneddon, RN Outcome: Progressing   Problem: Nutrition: Goal: Adequate nutrition will be maintained 01/21/2022 0637 by  Marjory Sneddon, RN Outcome: Progressing 01/21/2022 0613 by Marjory Sneddon, RN Outcome: Progressing   Problem: Coping: Goal: Level of anxiety will decrease 01/21/2022 0637 by Marjory Sneddon, RN Outcome: Progressing 01/21/2022 0613 by Marjory Sneddon, RN Outcome: Progressing   Problem: Elimination: Goal: Will not experience complications related to bowel motility 01/21/2022 0637 by Marjory Sneddon, RN Outcome: Progressing 01/21/2022 0613 by Marjory Sneddon, RN Outcome: Progressing Goal: Will not experience complications related to urinary retention 01/21/2022 0637 by Marjory Sneddon, RN Outcome: Progressing 01/21/2022 0613 by Marjory Sneddon, RN Outcome: Progressing   Problem: Pain Managment: Goal: General experience of comfort will improve 01/21/2022 0637 by Marjory Sneddon, RN Outcome: Progressing 01/21/2022 0613 by Marjory Sneddon, RN Outcome: Progressing   Problem: Safety: Goal: Ability to remain free from injury will improve 01/21/2022 0637 by Marjory Sneddon, RN Outcome: Progressing 01/21/2022 0613 by Marjory Sneddon, RN Outcome: Progressing   Problem: Skin Integrity: Goal: Risk for impaired skin integrity will decrease 01/21/2022 0637 by Marjory Sneddon, RN Outcome: Progressing 01/21/2022 0613 by Marjory Sneddon, RN Outcome: Progressing   Problem: Education: Goal: Ability to verbalize activity precautions or restrictions will improve 01/21/2022 0637 by Marjory Sneddon, RN Outcome: Progressing 01/21/2022 0613 by Marjory Sneddon, RN Outcome: Progressing Goal: Knowledge of the prescribed therapeutic regimen will improve 01/21/2022 0637 by Marjory Sneddon, RN Outcome: Progressing 01/21/2022 0613 by Marjory Sneddon, RN Outcome: Progressing Goal: Understanding of discharge needs will improve 01/21/2022 0637 by Marjory Sneddon, RN Outcome: Progressing 01/21/2022 0613 by Marjory Sneddon, RN Outcome: Progressing   Problem: Activity: Goal: Ability to avoid complications of mobility impairment will  improve 01/21/2022 0637 by Marjory Sneddon, RN Outcome: Progressing 01/21/2022 0613 by Marjory Sneddon, RN Outcome: Progressing Goal: Ability to tolerate increased activity will improve 01/21/2022 0637 by Marjory Sneddon, RN Outcome: Progressing 01/21/2022 0613 by Marjory Sneddon, RN Outcome: Progressing Goal: Will remain free from falls 01/21/2022 0637 by Marjory Sneddon, RN Outcome: Progressing 01/21/2022 0613 by Marjory Sneddon, RN Outcome: Progressing  Problem: Bowel/Gastric: Goal: Gastrointestinal status for postoperative course will improve 01/21/2022 0637 by Marjory Sneddon, RN Outcome: Progressing 01/21/2022 0613 by Marjory Sneddon, RN Outcome: Progressing   Problem: Clinical Measurements: Goal: Ability to maintain clinical measurements within normal limits will improve 01/21/2022 0637 by Marjory Sneddon, RN Outcome: Progressing 01/21/2022 0613 by Marjory Sneddon, RN Outcome: Progressing Goal: Postoperative complications will be avoided or minimized 01/21/2022 0637 by Marjory Sneddon, RN Outcome: Progressing 01/21/2022 0613 by Marjory Sneddon, RN Outcome: Progressing Goal: Diagnostic test results will improve 01/21/2022 0637 by Marjory Sneddon, RN Outcome: Progressing 01/21/2022 0613 by Marjory Sneddon, RN Outcome: Progressing   Problem: Pain Management: Goal: Pain level will decrease 01/21/2022 0637 by Marjory Sneddon, RN Outcome: Progressing 01/21/2022 0613 by Marjory Sneddon, RN Outcome: Progressing   Problem: Skin Integrity: Goal: Will show signs of wound healing 01/21/2022 0637 by Marjory Sneddon, RN Outcome: Progressing 01/21/2022 0613 by Marjory Sneddon, RN Outcome: Progressing   Problem: Health Behavior/Discharge Planning: Goal: Identification of resources available to assist in meeting health care needs will improve 01/21/2022 0637 by Marjory Sneddon, RN Outcome: Progressing 01/21/2022 0613 by Marjory Sneddon, RN Outcome: Progressing   Problem: Bladder/Genitourinary: Goal: Urinary functional  status for postoperative course will improve 01/21/2022 0637 by Marjory Sneddon, RN Outcome: Progressing 01/21/2022 0613 by Marjory Sneddon, RN Outcome: Progressing

## 2022-01-21 NOTE — Progress Notes (Signed)
Occupational Therapy Treatment Patient Details Name: Danielle [name] MRN: [id number] DOB: Oct 29, 1945 Today's Date: 01/21/2022   History of present illness Patient is a 77 year old female who presents to the emergency department 12/29/21 with concern for abnormal behaviors, recently in hospital for same.patient noted to have "Chronic appearing compression fracture of L3 with approximately 40%  vertebral body height loss anteriorly.   there is no high-grade spinal canal stenosis in the thoracic or lumbar spine. Moderate bilateral neural foraminal narrowing at T1-T2" .  patient underwent kyphoplasty on 1/5. no brace needed per orders. PMH: pseudodementia, depression, anxiety.   OT comments  Patient is progressing towards goals. Patient was able to participate in transfers from recliner to [facility] then to bed with RW with increased time and cues for sequencing. Patient was able to have a few more conversations that were appropriate compared to previous session. Patient will need caregiver support in next level of care.  Patient's discharge plan remains appropriate at this time. OT will continue to follow acutely.     Recommendations for follow up therapy are one component of a multi-disciplinary discharge planning process, led by the attending physician.  Recommendations may be updated based on patient status, additional functional criteria and insurance authorization.    Follow Up Recommendations  Skilled nursing-short term rehab (<3 hours/day)     Assistance Recommended at Discharge Frequent or constant Supervision/Assistance  Patient can return home with the following  A lot of help with bathing/dressing/bathroom;Assistance with cooking/housework;Direct supervision/assist for medications management;Assist for transportation;Direct supervision/assist for financial management;Help with stairs or ramp for entrance;Two people to help with walking and/or transfers   Equipment Recommendations  Other  (comment) (defer to next venue)    Recommendations for Other Services      Precautions / Restrictions Precautions Precautions: Fall Precaution Comments: dementia, Wernicke's type aphasia, back precautions Required Braces or Orthoses:  (no brace per MD) Restrictions Weight Bearing Restrictions: No       Mobility Bed Mobility Overal bed mobility: Needs Assistance Bed Mobility: Sit to Sidelying Rolling: Mod assist       Sit to sidelying: Max assist General bed mobility comments: increased time to follow back precautions to transition into sidelying then supine in bed. rolling with mod A to complete hygiene in bed       Balance Overall balance assessment: Needs assistance Sitting-balance support: Feet supported, Bilateral upper extremity supported Sitting balance-Leahy Scale: Fair     Standing balance support: Bilateral upper extremity supported, During functional activity, Reliant on assistive device for balance Standing balance-Leahy Scale: Poor           ADL either performed or assessed with clinical judgement   ADL Overall ADL's : Needs assistance/impaired         Toilet Transfer: Moderate assistance;Rolling walker (2 wheels) Toilet Transfer Details (indicate cue type and reason): from recliner to [facility], and BSC to bed with increased time Toileting- Clothing Manipulation and Hygiene: Total assistance;Sit to/from stand Toileting - Clothing Manipulation Details (indicate cue type and reason): patient was max A for standing hygiene and then returned to bed to ensure complete hygiene as there was some BM on pad when patient initally stood up.     Functional mobility during ADLs: Moderate assistance;Rolling walker (2 wheels)        Cognition Arousal/Alertness: Awake/alert Behavior During Therapy: Flat affect Overall Cognitive Status: No family/caregiver present to determine baseline cognitive functioning  Pertinent Vitals/ Pain        Pain Assessment Pain Assessment: Faces Faces Pain Scale: Hurts a little bit Pain Location: back Pain Descriptors / Indicators: Discomfort Pain Intervention(s): Limited activity within patient's tolerance, Monitored during session         Frequency  Min 2X/week        Progress Toward Goals  OT Goals(current goals can now be found in the care plan section)  Progress towards OT goals: Progressing toward goals     Plan Discharge plan remains appropriate       AM-PAC OT "6 Clicks" Daily Activity     Outcome Measure   Help from another person eating meals?: A Little Help from another person taking care of personal grooming?: A Little Help from another person toileting, which includes using toliet, bedpan, or urinal?: A Lot Help from another person bathing (including washing, rinsing, drying)?: A Lot Help from another person to put on and taking off regular upper body clothing?: A Lot Help from another person to put on and taking off regular lower body clothing?: A Lot 6 Click Score: 14    End of Session Equipment Utilized During Treatment: Rolling walker (2 wheels)  OT Visit Diagnosis: Unsteadiness on feet (R26.81);Other abnormalities of gait and mobility (R26.89);Muscle weakness (generalized) (M62.81);History of falling (Z91.81);Pain   Activity Tolerance Patient tolerated treatment well   Patient Left in bed;with call bell/phone within reach;with nursing/sitter in room   Nurse Communication Other (comment) (ok to participate in session)        Time: 2426-8341 OT Time Calculation (min): 26 min  Charges: OT General Charges $OT Visit: 1 Visit OT Treatments $Self Care/Home Management : 23-37 mins  Rennie Plowman, MS Acute Rehabilitation Department Office# [phone]   Willa Rough 01/21/2022, 4:37 PM

## 2022-01-21 NOTE — Plan of Care (Signed)
Patient alert and oriented to self only.  VSS throughout shift with soft BP this morning.  Pt remains asymptomatic.  Denied pain and SOB.  Diminished lungs, IS encouraged.  Purewick in place, pt didn't void on bedside commode.  Bladder scan 355m, I&O cath 4529m  POC maintained, will continue to monitor.  Bed alarm activated.  Problem: Education: Goal: Knowledge of General Education information will improve Description: Including pain rating scale, medication(s)/side effects and non-pharmacologic comfort measures Outcome: Progressing   Problem: Health Behavior/Discharge Planning: Goal: Ability to manage health-related needs will improve Outcome: Progressing   Problem: Clinical Measurements: Goal: Ability to maintain clinical measurements within normal limits will improve Outcome: Progressing Goal: Will remain free from infection Outcome: Progressing Goal: Diagnostic test results will improve Outcome: Progressing Goal: Respiratory complications will improve Outcome: Progressing Goal: Cardiovascular complication will be avoided Outcome: Progressing   Problem: Activity: Goal: Risk for activity intolerance will decrease Outcome: Progressing   Problem: Nutrition: Goal: Adequate nutrition will be maintained Outcome: Progressing   Problem: Coping: Goal: Level of anxiety will decrease Outcome: Progressing   Problem: Elimination: Goal: Will not experience complications related to bowel motility Outcome: Progressing Goal: Will not experience complications related to urinary retention Outcome: Progressing   Problem: Pain Managment: Goal: General experience of comfort will improve Outcome: Progressing   Problem: Safety: Goal: Ability to remain free from injury will improve Outcome: Progressing   Problem: Skin Integrity: Goal: Risk for impaired skin integrity will decrease Outcome: Progressing   Problem: Education: Goal: Ability to verbalize activity precautions or  restrictions will improve Outcome: Progressing Goal: Knowledge of the prescribed therapeutic regimen will improve Outcome: Progressing Goal: Understanding of discharge needs will improve Outcome: Progressing   Problem: Activity: Goal: Ability to avoid complications of mobility impairment will improve Outcome: Progressing Goal: Ability to tolerate increased activity will improve Outcome: Progressing Goal: Will remain free from falls Outcome: Progressing   Problem: Bowel/Gastric: Goal: Gastrointestinal status for postoperative course will improve Outcome: Progressing   Problem: Clinical Measurements: Goal: Ability to maintain clinical measurements within normal limits will improve Outcome: Progressing Goal: Postoperative complications will be avoided or minimized Outcome: Progressing Goal: Diagnostic test results will improve Outcome: Progressing   Problem: Pain Management: Goal: Pain level will decrease Outcome: Progressing   Problem: Skin Integrity: Goal: Will show signs of wound healing Outcome: Progressing   Problem: Health Behavior/Discharge Planning: Goal: Identification of resources available to assist in meeting health care needs will improve Outcome: Progressing   Problem: Bladder/Genitourinary: Goal: Urinary functional status for postoperative course will improve Outcome: Progressing

## 2022-01-21 NOTE — TOC Progression Note (Signed)
Transition of Care [facility]) - Progression Note    Patient Details  Name: Danielle [name] MRN: [id number] Date of Birth: 09/05/1945  Transition of Care [facility]) CM/SW Contact  Joaquin Courts, RN Phone Number: 01/21/2022, 2:04 PM  Clinical Narrative:    [facility] patient.     Barriers to Discharge: Continued Medical Work up  Expected Discharge Plan and Services In-house Referral: NA Discharge Planning Services: CM Consult Post Acute Care Choice: [place] arrangements for the past 2 months: Single Family Home                   DME Agency: NA       HH Arranged: NA [place] Agency: NA         Social Determinants of Health (SDOH) Interventions [place]: No Food Insecurity (01/17/2022)  Housing: Low Risk  (01/17/2022)  Transportation Needs: No Transportation Needs (01/17/2022)  Utilities: Not At Risk (01/17/2022)  Alcohol Screen: Low Risk  (11/26/2021)  Depression (PHQ2-9): High Risk (10/09/2021)  Tobacco Use: Low Risk  (01/19/2022)    Readmission Risk Interventions    10/31/2021    5:06 PM  Readmission Risk Prevention Plan  Transportation Screening Complete  PCP or Specialist Appt within 3-5 Days Complete  HRI or [place] Complete  Social Work Consult for [place] Planning/Counseling Complete  Palliative Care Screening Not Applicable  Medication Review Press photographer) Complete

## 2022-01-22 ENCOUNTER — Ambulatory Visit (INDEPENDENT_AMBULATORY_CARE_PROVIDER_SITE_OTHER): Payer: Self-pay | Admitting: Behavioral Health

## 2022-01-22 DIAGNOSIS — F489 Nonpsychotic mental disorder, unspecified: Secondary | ICD-10-CM

## 2022-01-22 DIAGNOSIS — R4189 Other symptoms and signs involving cognitive functions and awareness: Secondary | ICD-10-CM | POA: Diagnosis not present

## 2022-01-22 NOTE — Progress Notes (Signed)
Physical Therapy Treatment Patient Details Name: Danielle [name] MRN: [id number] DOB: 1945-11-05 Today's Date: 01/22/2022   History of Present Illness Patient is a 77 year old female who presents to the emergency department 12/29/21 with concern for abnormal behaviors, recently in hospital for same.patient noted to have "Chronic appearing compression fracture of L3 with approximately 40%  vertebral body height loss anteriorly.   there is no high-grade spinal canal stenosis in the thoracic or lumbar spine. Moderate bilateral neural foraminal narrowing at T1-T2" .  patient underwent kyphoplasty on 1/5. no brace needed per orders. PMH: pseudodementia, depression, anxiety.    PT Comments    Pt in recliner, appears uncomfortable as she has writhing type movements at her pelvis. Mod assist for sit to stand. Pt ambulated 52' with RW with min assist for management of RW,  shuffling gait pattern. Attempted seated knee extension exercises with assistance but pt appeared to have discomfort with this. RN administered pain medication at end of session. Pt not able to follow commands this session, she did not have any meaningful verbal responses to commands/questions.     Recommendations for follow up therapy are one component of a multi-disciplinary discharge planning process, led by the attending physician.  Recommendations may be updated based on patient status, additional functional criteria and insurance authorization.  Follow Up Recommendations  Skilled nursing-short term rehab (<3 hours/day) Can patient physically be transported by private vehicle: No   Assistance Recommended at Discharge Frequent or constant Supervision/Assistance  Patient can return home with the following Two people to help with bathing/dressing/bathroom;Assistance with cooking/housework;Assistance with feeding;Direct supervision/assist for medications management;Direct supervision/assist for financial management;Assist for  transportation;Help with stairs or ramp for entrance;A lot of help with walking and/or transfers   Equipment Recommendations  None recommended by PT    Recommendations for Other Services       Precautions / Restrictions Precautions Precautions: Fall Precaution Comments: dementia, Wernicke's type aphasia, back precautions Required Braces or Orthoses:  (no brace per MD) Restrictions Weight Bearing Restrictions: No     Mobility  Bed Mobility Overal bed mobility: Needs Assistance         Sit to supine: Mod assist   General bed mobility comments: assist for BLEs into bed and then +2 total to scoot up in bed    Transfers Overall transfer level: Needs assistance Equipment used: Rolling walker (2 wheels) Transfers: Sit to/from Stand Sit to Stand: Mod assist           General transfer comment: mod A to steady, heavy multimodal cues for performance, improved standing balance with therapist shifting RW further anterior    Ambulation/Gait Ambulation/Gait assistance: Min assist Gait Distance (Feet): 16 Feet Assistive device: Rolling walker (2 wheels) Gait Pattern/deviations: Shuffle, Decreased step length - left, Decreased stance time - right Gait velocity: decreased     General Gait Details: shuffling gait, no response to commands to increase step length   Stairs             Wheelchair Mobility    Modified Rankin (Stroke Patients Only)       Balance Overall balance assessment: Needs assistance Sitting-balance support: Feet supported, Bilateral upper extremity supported Sitting balance-Leahy Scale: Fair     Standing balance support: Bilateral upper extremity supported, During functional activity, Reliant on assistive device for balance Standing balance-Leahy Scale: Poor  Cognition Arousal/Alertness: Awake/alert Behavior During Therapy: Flat affect Overall Cognitive Status: No family/caregiver present to  determine baseline cognitive functioning                   Orientation Level: Place, Time, Situation     Following Commands: Follows one step commands inconsistently       General Comments: pt repeatedly stated, "no, no, no" throughout session, no meaningful response to questions nor to commands        Exercises General Exercises - Lower Extremity Long Arc Quad: AAROM, Both, 5 reps, Seated (pt appeared to be having discomfort with this, so stopped after 5 reps)    General Comments        Pertinent Vitals/Pain Pain Assessment Breathing: occasional labored breathing, short period of hyperventilation Negative Vocalization: occasional moan/groan, low speech, negative/disapproving quality Facial Expression: sad, frightened, frown Body Language: tense, distressed pacing, fidgeting Consolability: distracted or reassured by voice/touch PAINAD Score: 5 Facial Expression: Tense Body Movements: Restlessness Muscle Tension: Tense, rigid Pain Location: pt with writhing type motions at pelvis, not able to verbalize location of pain Pain Intervention(s): RN gave pain meds during session, Limited activity within patient's tolerance, Monitored during session    Home Living                          Prior Function            PT Goals (current goals can now be found in the care plan section) Acute Rehab PT Goals Patient Stated Goal: none stated PT Goal Formulation: Patient unable to participate in goal setting Time For Goal Achievement: 01/31/22 Potential to Achieve Goals: Fair Progress towards PT goals: Progressing toward goals    Frequency    Min 2X/week      PT Plan Current plan remains appropriate    Co-evaluation              AM-PAC PT "6 Clicks" Mobility   Outcome Measure  Help needed turning from your back to your side while in a flat bed without using bedrails?: A Little Help needed moving from lying on your back to sitting on the side of  a flat bed without using bedrails?: A Lot Help needed moving to and from a bed to a chair (including a wheelchair)?: A Lot Help needed standing up from a chair using your arms (e.g., wheelchair or bedside chair)?: A Lot Help needed to walk in hospital room?: A Lot Help needed climbing 3-5 steps with a railing? : Total 6 Click Score: 12    End of Session Equipment Utilized During Treatment: Gait belt Activity Tolerance: Patient limited by pain Patient left: in bed;with call bell/phone within reach;with bed alarm set;with nursing/sitter in room Nurse Communication: Mobility status PT Visit Diagnosis: Unsteadiness on feet (R26.81);Difficulty in walking, not elsewhere classified (R26.2)     Time: 7510-2585 PT Time Calculation (min) (ACUTE ONLY): 15 min  Charges:  $Gait Training: 8-22 mins                     Blondell Reveal Kistler PT 01/22/2022  Acute Rehabilitation Services  Office [phone]

## 2022-01-22 NOTE — Progress Notes (Signed)
  Progress Note   Patient: Danielle [name] JIR:[id number] DOB: 1945-03-12 DOA: 12/29/2021     7 DOS: the patient was seen and examined on 01/22/2022 at 9:21 AM      Brief hospital course: Danielle [name] is a 77 y.o. F with obesity BMI 34, OSA on CPAP, hypothyroidism, HTN, CAD, depression and pseudodementia who presented with erratic behavior.  12/18: Initially presented with erratic behavior 12/19-20: Psychiatry evaluated, recommended discharge to SNF or memory care 12/27: Urine culture sent for no apparent reason, started Cipro 12/29-30: Patient with temp 100.38F, hospitalists consulted for "fever"   12/27: Urine culture multiple species 12/30: Repeat urine culture no growth 1/3: Repeat urine culture no growth 1/4: Blood cultures x2 no growth     Assessment and Plan: * Pseudodementia Stable Prozac and Geodon discontinued by Psychiatry - PT - TOC consult for placement - Continue Seroquel, propranolol, trazodone     AKI (acute kidney injury) ([place]) Cr 1.2 on admisison, improved to baseline 0.8 with fluids.  Hypomagnesemia Supplemented  Lumbar compression fracture (HCC) Treated by orthopedics - PT eval - Analgesics as needed  Purported UTI Urine culture with multiple species. Repeat on admission no growth, repeat again during hospitalization no growth. - Continue FLomax  Depression with anxiety - Continue Seroquel, trazodone  Hypothyroidism - Continue levothyroxine  Hyperlipidemia LDL goal <70 - Continue Crestor  Hypertension BP normal - Continue propranolol  OSA (obstructive sleep apnea) - Continue CPAP at night          Subjective: No new nursing concerns, no fever, pain complaints.     Physical Exam: BP (!) 146/82 (BP Location: Right Arm)   Pulse 94   Temp 98.1 F (36.7 C) (Oral)   Resp 18   Ht '5\' 3"'$  (1.6 m)   Wt 87.5 kg   SpO2 98%   BMI 34.17 kg/m   Elderly adult female, lying in bed, no acute distress RRR, no murmurs, no  peripheral edema Respiratory rate normal, lungs clear without rales or wheezes Abdomen soft without grimace to palpation Interactive but ramlbing, moves upper extremities with generalized weakness but symmetric strength     Data Reviewed: No new labs  Family Communication: None present    Disposition: Status is: Inpatient Patient presented to the hospital for erratic behavior due to her dementia.  She was thought to have a UTI during her ER stay, and so was admitted to the hospital, but this was treated she is now stable.  Medically ready for discharge when safe dispposition is arranged.        Author: Edwin Dada, MD 01/22/2022 11:17 AM  For on call review [email or web address].

## 2022-01-22 NOTE — Progress Notes (Signed)
Pt did not show for scheduled appt and did not notify this office within 24 hours as required. Fees to be assessed.

## 2022-01-23 DIAGNOSIS — R4189 Other symptoms and signs involving cognitive functions and awareness: Secondary | ICD-10-CM | POA: Diagnosis not present

## 2022-01-23 LAB — CBC
HCT: 39.4 % (ref 36.0–46.0)
Hemoglobin: 12.9 g/dL (ref 12.0–15.0)
MCH: 29.9 pg (ref 26.0–34.0)
MCHC: 32.7 g/dL (ref 30.0–36.0)
MCV: 91.4 fL (ref 80.0–100.0)
Platelets: 242 10*3/uL (ref 150–400)
RBC: 4.31 MIL/uL (ref 3.87–5.11)
RDW: 13.4 % (ref 11.5–15.5)
WBC: 6.2 10*3/uL (ref 4.0–10.5)
nRBC: 0 % (ref 0.0–0.2)

## 2022-01-23 LAB — COMPREHENSIVE METABOLIC PANEL
ALT: 11 U/L (ref 0–44)
AST: 14 U/L — ABNORMAL LOW (ref 15–41)
Albumin: 3.3 g/dL — ABNORMAL LOW (ref 3.5–5.0)
Alkaline Phosphatase: 67 U/L (ref 38–126)
Anion gap: 8 (ref 5–15)
BUN: 13 mg/dL (ref 8–23)
CO2: 25 mmol/L (ref 22–32)
Calcium: 9.6 mg/dL (ref 8.9–10.3)
Chloride: 108 mmol/L (ref 98–111)
Creatinine, Ser: 0.91 mg/dL (ref 0.44–1.00)
GFR, Estimated: >60 mL/min (ref >60)
Glucose, Bld: 94 mg/dL (ref 70–99)
Potassium: 4.2 mmol/L (ref 3.5–5.1)
Sodium: 141 mmol/L (ref 135–145)
Total Bilirubin: 0.6 mg/dL (ref 0.3–1.2)
Total Protein: 5.8 g/dL — ABNORMAL LOW (ref 6.5–8.1)

## 2022-01-23 NOTE — TOC Progression Note (Signed)
Transition of Care [facility]) - Progression Note    Patient Details  Name: Danielle [name] MRN: [id number] Date of Birth: 15-Apr-1945  Transition of Care [facility]) CM/SW Contact  Joaquin Courts, RN Phone Number: 01/23/2022, 11:39 AM  Clinical Narrative:    [facility] has reviewed referral again and have reiterated that they cannot accept patient. They have declined to make a bed offer.     Barriers to Discharge: Continued Medical Work up  Expected Discharge Plan and Services In-house Referral: NA Discharge Planning Services: CM Consult Post Acute Care Choice: [place] arrangements for the past 2 months: Single Family Home                   DME Agency: NA       HH Arranged: NA [place] Agency: NA         Social Determinants of Health (SDOH) Interventions [place]: No Food Insecurity (01/17/2022)  Housing: Low Risk  (01/17/2022)  Transportation Needs: No Transportation Needs (01/17/2022)  Utilities: Not At Risk (01/17/2022)  Alcohol Screen: Low Risk  (11/26/2021)  Depression (PHQ2-9): High Risk (10/09/2021)  Tobacco Use: Low Risk  (01/19/2022)    Readmission Risk Interventions    10/31/2021    5:06 PM  Readmission Risk Prevention Plan  Transportation Screening Complete  PCP or Specialist Appt within 3-5 Days Complete  HRI or [place] Complete  Social Work Consult for [place] Planning/Counseling Complete  Palliative Care Screening Not Applicable  Medication Review Press photographer) Complete

## 2022-01-23 NOTE — TOC Progression Note (Addendum)
Transition of Care [facility]) - Progression Note    Patient Details  Name: Danielle [name] MRN: [id number] Date of Birth: January 28, 1945  [facility]) CM/SW Contact  Joaquin Courts, RN Phone Number: 01/23/2022, 2:12 PM  Clinical Narrative:     CM received call from [facility] corporate liaison who reports [facility] in [place] can offer patient a bed, contingent on patient's spouse filling out a long term medicaid application with the department of social services and completing the screening process with corporate liaison.  CM called and left a HIPAA compliant VM for Mr Deyarmin, contacted patient's con mark as well with request to assist with locating Mr Worthey.   Addendum: spouse reports that he has applied for long term medicaid, reports he has spoken with Karene Fry [phone].  VM left for Shaun Arbaugh updating him on this information.    Barriers to Discharge: Continued Medical Work up  Expected Discharge Plan and Services In-house Referral: NA Discharge Planning Services: CM Consult Post Acute Care Choice: [place] arrangements for the past 2 months: Single Family Home                   DME Agency: NA       HH Arranged: NA [place] Agency: NA         Social Determinants of Health (SDOH) Interventions [place]: No Food Insecurity (01/17/2022)  Housing: Low Risk  (01/17/2022)  Transportation Needs: No Transportation Needs (01/17/2022)  Utilities: Not At Risk (01/17/2022)  Alcohol Screen: Low Risk  (11/26/2021)  Depression (PHQ2-9): High Risk (10/09/2021)  Tobacco Use: Low Risk  (01/19/2022)    Readmission Risk Interventions    10/31/2021    5:06 PM  Readmission Risk Prevention Plan  Transportation Screening Complete  PCP or Specialist Appt within 3-5 Days Complete  HRI or [place] Complete  Social Work Consult for [place] Planning/Counseling Complete  Palliative Care  Screening Not Applicable  Medication Review Press photographer) Complete

## 2022-01-23 NOTE — TOC Progression Note (Signed)
Transition of Care [facility]) - Progression Note    Patient Details  Name: Ledora Delker MRN: [id number] Date of Birth: 1945-05-08  Transition of Care [facility]) CM/SW Contact  Joaquin Courts, RN Phone Number: 01/23/2022, 11:08 AM  Clinical Narrative:    CM spoke with Collene Gobble with [facility] and went over referral for SNF placement.  Shaun reports he will review the information and will attempt to come to see patient at bedside for an in-person eval, reports this could be as early at this afternoon/evening.      Barriers to Discharge: Continued Medical Work up  Expected Discharge Plan and Services In-house Referral: NA Discharge Planning Services: CM Consult Post Acute Care Choice: [place] arrangements for the past 2 months: Single Family Home                   DME Agency: NA       HH Arranged: NA [place] Agency: NA         Social Determinants of Health (SDOH) Interventions [place]: No Food Insecurity (01/17/2022)  Housing: Low Risk  (01/17/2022)  Transportation Needs: No Transportation Needs (01/17/2022)  Utilities: Not At Risk (01/17/2022)  Alcohol Screen: Low Risk  (11/26/2021)  Depression (PHQ2-9): High Risk (10/09/2021)  Tobacco Use: Low Risk  (01/19/2022)    Readmission Risk Interventions    10/31/2021    5:06 PM  Readmission Risk Prevention Plan  Transportation Screening Complete  PCP or Specialist Appt within 3-5 Days Complete  HRI or [place] Complete  Social Work Consult for [place] Planning/Counseling Complete  Palliative Care Screening Not Applicable  Medication Review Press photographer) Complete

## 2022-01-23 NOTE — Progress Notes (Signed)
  Progress Note   Patient: Danielle [name] MHD:[id number] DOB: 1945-12-24 DOA: 12/29/2021     8 DOS: the patient was seen and examined on 01/23/2022 at 9:21 AM      Brief hospital course: Mrs. Deats is a 77 y.o. F with obesity BMI 34, OSA on CPAP, hypothyroidism, HTN, CAD, depression and pseudodementia who presented with erratic behavior.  12/18: Initially presented with erratic behavior 12/19-20: Psychiatry evaluated, recommended discharge to SNF or memory care 12/27: Urine culture sent for no apparent reason, started Cipro 12/29-30: Patient with temp 100.72F, hospitalists consulted for "fever"   12/27: Urine culture multiple species 12/30: Repeat urine culture no growth 1/3: Repeat urine culture no growth 1/4: Blood cultures x2 no growth     Assessment and Plan: * Pseudodementia Stable Prozac and Geodon discontinued by Psychiatry - PT - TOC consult for placement - Continue Seroquel, propranolol, trazodone     AKI (acute kidney injury) ([place]) Cr 1.2 on admisison, improved to baseline 0.8 with fluids.  Hypomagnesemia Supplemented  Lumbar compression fracture (HCC) Treated by orthopedics - PT eval - Analgesics as needed  Purported UTI Urine culture with multiple species. Repeat on admission no growth, repeat again during hospitalization no growth. - Continue FLomax  Depression with anxiety - Continue Seroquel, trazodone  Hypothyroidism - Continue levothyroxine  Hyperlipidemia LDL goal <70 - Continue Crestor  Hypertension BP normal - Continue propranolol  OSA (obstructive sleep apnea) - Continue CPAP at night          Subjective: No new nursing concerns, fever, or respiratory symptoms.     Physical Exam: BP 131/77 (BP Location: Right Arm)   Pulse 89   Temp 97.9 F (36.6 C) (Oral)   Resp 18   Ht '5\' 3"'$  (1.6 m)   Wt 87.5 kg   SpO2 100%   BMI 34.17 kg/m   Elderly adult female, lying in bed, somewhat restless but no acute  distress RRR, no murmurs, no peripheral edema Respiratory rate normal, lungs clear without rales or wheezes Abdomen soft without grimace to palpation Interactive but ramlbing, moves upper extremities with generalized weakness but symmetric strength     Data Reviewed: No new labs  Family Communication: None present    Disposition: Status is: Inpatient Patient presented to the hospital for erratic behavior due to her dementia.  She was thought to have a UTI during her ER stay, and so was admitted to the hospital, but this was treated she is now stable.  Medically ready for discharge when safe dispposition is arranged.        Author: Edwin Dada, MD 01/23/2022 11:05 AM  For on call review [email or web address].

## 2022-01-23 NOTE — TOC Progression Note (Signed)
Transition of Care [facility]) - Progression Note    Patient Details  Name: Keishawn Darsey MRN: [id number] Date of Birth: May 09, 1945  Transition of Care [facility]) CM/SW Contact  Joaquin Courts, RN Phone Number: 01/23/2022, 10:11 AM  Clinical Narrative:    CM has reached out to [place], [place], and [facility] corporate liaison to request reconsideration of denials.  Awaiting call back from reps.     Barriers to Discharge: Continued Medical Work up  Expected Discharge Plan and Services In-house Referral: NA Discharge Planning Services: CM Consult Post Acute Care Choice: [place] arrangements for the past 2 months: Single Family Home                   DME Agency: NA       HH Arranged: NA [place] Agency: NA         Social Determinants of Health (SDOH) Interventions [place]: No Food Insecurity (01/17/2022)  Housing: Low Risk  (01/17/2022)  Transportation Needs: No Transportation Needs (01/17/2022)  Utilities: Not At Risk (01/17/2022)  Alcohol Screen: Low Risk  (11/26/2021)  Depression (PHQ2-9): High Risk (10/09/2021)  Tobacco Use: Low Risk  (01/19/2022)    Readmission Risk Interventions    10/31/2021    5:06 PM  Readmission Risk Prevention Plan  Transportation Screening Complete  PCP or Specialist Appt within 3-5 Days Complete  HRI or [place] Complete  Social Work Consult for [place] Planning/Counseling Complete  Palliative Care Screening Not Applicable  Medication Review Press photographer) Complete

## 2022-01-24 DIAGNOSIS — R4189 Other symptoms and signs involving cognitive functions and awareness: Secondary | ICD-10-CM | POA: Diagnosis not present

## 2022-01-24 NOTE — Progress Notes (Signed)
  Progress Note   Patient: Danielle [name] OBS:[id number] DOB: 1945-11-24 DOA: 12/29/2021     9 DOS: the patient was seen and examined on 01/24/2022 at 9:21 AM      Brief hospital course: Danielle [name] is a 77 y.o. F with obesity BMI 34, OSA on CPAP, hypothyroidism, HTN, CAD, depression and pseudodementia who presented with erratic behavior.  12/18: Initially presented with erratic behavior 12/19-20: Psychiatry evaluated, recommended discharge to SNF or memory care 12/27: Urine culture sent for no apparent reason, started Cipro 12/29-30: Patient with temp 100.31F, hospitalists consulted for "fever"   12/27: Urine culture multiple species 12/30: Repeat urine culture no growth 1/3: Repeat urine culture no growth 1/4: Blood cultures x2 no growth     Assessment and Plan: * Pseudodementia Stable Prozac and Geodon discontinued by Psychiatry - PT - TOC consult for placement - Continue Seroquel, propranolol, trazodone     AKI (acute kidney injury) ([place]) Cr 1.2 on admisison, improved to baseline 0.8 with fluids.  Hypomagnesemia Supplemented  Lumbar compression fracture (HCC) Treated by orthopedics - PT eval - Analgesics as needed  Purported UTI Urine culture with multiple species. Repeat on admission no growth, repeat again during hospitalization no growth. - Continue FLomax  Depression with anxiety - Continue Seroquel, trazodone  Hypothyroidism - Continue levothyroxine  Hyperlipidemia LDL goal <70 - Continue Crestor  Hypertension BP normal - Continue propranolol  OSA (obstructive sleep apnea) - Continue CPAP at night          Subjective: No new nursing concerns, no new fever or pain complaints     Physical Exam: BP (!) 108/59 (BP Location: Right Arm)   Pulse 86   Temp 98 F (36.7 C) (Oral)   Resp 16   Ht '5\' 3"'$  (1.6 m)   Wt 87.5 kg   SpO2 99%   BMI 34.17 kg/m   Elderly adult female, lying in bed, somewhat restless but no acute  distress RRR, no murmurs, no peripheral edema Respiratory rate normal, lungs clear without rales or wheezes Abdomen soft without grimace to palpation Interactive but ramlbing, moves upper extremities with generalized weakness but symmetric strength     Data Reviewed: No new labs  Family Communication: Danielle [name] at the bedside   Disposition: Status is: Inpatient Patient presented to the hospital for erratic behavior due to her dementia.  She was thought to have a UTI during her ER stay, and so was admitted to the hospital, but this was treated she is now stable.  Medically ready for discharge when safe dispposition is arranged.        Author: Edwin Dada, MD 01/24/2022 4:35 PM  For on call review [email or web address].

## 2022-01-25 DIAGNOSIS — I10 Essential (primary) hypertension: Secondary | ICD-10-CM

## 2022-01-25 DIAGNOSIS — R4189 Other symptoms and signs involving cognitive functions and awareness: Secondary | ICD-10-CM | POA: Diagnosis not present

## 2022-01-25 DIAGNOSIS — E039 Hypothyroidism, unspecified: Secondary | ICD-10-CM

## 2022-01-25 DIAGNOSIS — F418 Other specified anxiety disorders: Secondary | ICD-10-CM | POA: Diagnosis not present

## 2022-01-25 DIAGNOSIS — F03918 Unspecified dementia, unspecified severity, with other behavioral disturbance: Secondary | ICD-10-CM | POA: Diagnosis not present

## 2022-01-25 NOTE — Progress Notes (Signed)
  Progress Note   Patient: Danielle [name] QVZ:[id number] DOB: July 28, 1945 DOA: 12/29/2021     10 DOS: the patient was seen and examined on 01/25/2022 at 9:21 AM      Brief hospital course: Danielle [name] is a 77 y.o. F with obesity BMI 34, OSA on CPAP, hypothyroidism, HTN, CAD, depression and pseudodementia who presented with erratic behavior.  12/18: Initially presented with erratic behavior 12/19-20: Psychiatry evaluated, recommended discharge to SNF or memory care 12/27: Urine culture sent for no apparent reason, started Cipro 12/29-30: Patient with temp 100.92F, hospitalists consulted for "fever"   12/27: Urine culture multiple species 12/30: Repeat urine culture no growth 1/3: Repeat urine culture no growth 1/4: Blood cultures x2 no growth     Assessment and Plan: * Pseudodementia Stable Prozac and Geodon discontinued by Psychiatry - PT - TOC consult for placement - Continue Seroquel, propranolol, trazodone     AKI (acute kidney injury) ([place]) Cr 1.2 on admisison, improved to baseline 0.8 with fluids.  Hypomagnesemia Supplemented  Lumbar compression fracture (HCC) Treated by orthopedics - PT eval - Analgesics as needed  Purported UTI Urine culture with multiple species. Repeat on admission no growth, repeat again during hospitalization no growth. - Continue FLomax  Depression with anxiety - Continue Seroquel, trazodone  Hypothyroidism - Continue levothyroxine  Hyperlipidemia LDL goal <70 - Continue Crestor  Hypertension BP normal - Continue propranolol  OSA (obstructive sleep apnea) - Continue CPAP at night          Subjective: Patient has no fever or pain complaints.  No nursing concerns.     Physical Exam: BP 121/72 (BP Location: Left Arm)   Pulse 88   Temp (!) 97.4 F (36.3 C) (Oral)   Resp 14   Ht '5\' 3"'$  (1.6 m)   Wt 87.5 kg   SpO2 96%   BMI 34.17 kg/m   Elderly adult female, lying in bed, no acute distress,  interactive RRR, no murmurs, no peripheral edema Respiratory normal, lungs clear without rales or wheezes Abdomen soft, no grimace to palpation, no tenderness or guarding Interactive, makes eye contact, speaks several complete sentences, but these are nonsensical, not goal oriented, she moves her upper extremities and lower extremities with generalized weakness but symmetric strength        Data Reviewed: No new labs  Family Communication: None present   Disposition: Status is: Inpatient Patient presented to the hospital for erratic behavior due to her dementia.  She was thought to have a UTI during her ER stay, and so was admitted to the hospital, but this was treated she is now stable.  Medically ready for discharge when safe dispposition is arranged.        Author: Edwin Dada, MD 01/25/2022 12:55 PM  For on call review [email or web address].

## 2022-01-26 DIAGNOSIS — F03918 Unspecified dementia, unspecified severity, with other behavioral disturbance: Secondary | ICD-10-CM | POA: Diagnosis not present

## 2022-01-26 DIAGNOSIS — I10 Essential (primary) hypertension: Secondary | ICD-10-CM | POA: Diagnosis not present

## 2022-01-26 DIAGNOSIS — F418 Other specified anxiety disorders: Secondary | ICD-10-CM | POA: Diagnosis not present

## 2022-01-26 DIAGNOSIS — R4189 Other symptoms and signs involving cognitive functions and awareness: Secondary | ICD-10-CM | POA: Diagnosis not present

## 2022-01-26 NOTE — Care Plan (Signed)
During her hospitalization here, the patient has not tolerated CPAP.  In discussion with husband, she was not wearing it at home prior to admission either.  Despite that, she has been medically stable, and showed no ill effects, and no signs of hypercarbia.  It is my medical opinion that given her dementia with behavioral disturbance, it would be exceedingly difficult for her to tolerate CPAP nightly without exacerbating her behavioral problems, and the benefit would be vastly outweighed by this risk.  Patient should not wear CPAP at this time.

## 2022-01-26 NOTE — Progress Notes (Signed)
  Progress Note   Patient: Danielle [name] [id number]:[id number] DOB: 1945/08/02 DOA: 12/29/2021     11 DOS: the patient was seen and examined on 01/26/2022 at 11:47AM      Brief hospital course: Danielle [name] is a 77 y.o. F with obesity BMI 34, OSA on CPAP, hypothyroidism, HTN, CAD, depression and pseudodementia who presented with erratic behavior.  12/18: Initially presented with erratic behavior 12/19-20: Psychiatry evaluated, recommended discharge to SNF or memory care 12/27: Urine culture sent for no apparent reason, started Cipro 12/29-30: Patient with temp 100.57F, hospitalists consulted for "fever"   12/27: Urine culture multiple species 12/30: Repeat urine culture no growth 1/3: Repeat urine culture no growth 1/4: Blood cultures x2 no growth     Assessment and Plan: * Pseudodementia Stable Prozac and Geodon discontinued by Psychiatry - PT - TOC consult for placement - Continue Seroquel, propranolol, trazodone     AKI (acute kidney injury) ([place]) Cr 1.2 on admisison, improved to baseline 0.8 with fluids.  Hypomagnesemia Supplemented  Lumbar compression fracture (HCC) Treated by orthopedics - PT eval - Analgesics as needed  Purported UTI Urine culture with multiple species. Repeat on admission no growth, repeat again during hospitalization no growth. - Continue FLomax  Depression with anxiety - Continue Seroquel, trazodone  Hypothyroidism - Continue levothyroxine  Hyperlipidemia LDL goal <70 - Continue Crestor  Hypertension BP normal - Continue propranolol  OSA (obstructive sleep apnea) - Continue CPAP at night          Subjective: No nursing concerns, Patient complains     Physical Exam: BP (!) 98/56 (BP Location: Left Arm)   Pulse 62   Temp 98.2 F (36.8 C) (Oral)   Resp 16   Ht '5\' 3"'$  (1.6 m)   Wt 87.5 kg   SpO2 96%   BMI 34.17 kg/m   Elderly adult female, sitting up in recliner, no acute distress, interactive RRR, no murmurs,  no peripheral edema Respiratory normal, lungs clear without rales or wheezes Abdomen soft, no grimace to palpation, no tenderness or guarding Interactive, makes eye contact, speaks several complete sentences, but these are nonsensical, not goal oriented, she moves her upper extremities and lower extremities with generalized weakness but symmetric strength        Data Reviewed: No new labs  Family Communication: None present   Disposition: Status is: Inpatient Patient presented to the hospital for erratic behavior due to her dementia.  She was thought to have a UTI during her ER stay, and so was admitted to the hospital, but this was treated she is now stable.  Medically ready for discharge when safe dispposition is arranged.        Author: Edwin Dada, MD 01/26/2022 3:54 PM  For on call review [email or web address].

## 2022-01-26 NOTE — TOC Progression Note (Signed)
Transition of Care [facility]) - Progression Note    Patient Details  Name: Danielle [name] MRN: [id number] Date of Birth: 1945/09/13  Transition of [facility]) CM/SW [place], RN Phone [phone]  01/26/2022, 3:22 PM  Clinical Narrative:    Cm received message that patient has 3 bed offers. Bed offers presented to husband Ed Anspaugh. Husband has decided on [facility] in [place].Insurance Josem Kaufmann has been initiated with [employer] health ref ID# [id number]. As soon as insurance Josem Kaufmann is obtained patient will be able to transfer to [facility] in [place].      Barriers to Discharge: Continued Medical Work up  Expected Discharge Plan and Services In-house Referral: NA Discharge Planning Services: CM Consult Post Acute Care Choice: [place] arrangements for the past 2 months: Single Family Home                   DME Agency: NA       HH Arranged: NA [place] Agency: NA         Social Determinants of Health (SDOH) Interventions [place]: No Food Insecurity (01/17/2022)  Housing: Low Risk  (01/17/2022)  Transportation Needs: No Transportation Needs (01/17/2022)  Utilities: Not At Risk (01/17/2022)  Alcohol Screen: Low Risk  (11/26/2021)  Depression (PHQ2-9): High Risk (10/09/2021)  Tobacco Use: Low Risk  (01/19/2022)    Readmission Risk Interventions    10/31/2021    5:06 PM  Readmission Risk Prevention Plan  Transportation Screening Complete  PCP or Specialist Appt within 3-5 Days Complete  HRI or [place] Complete  Social Work Consult for [place] Planning/Counseling Complete  Palliative Care Screening Not Applicable  Medication Review Press photographer) Complete

## 2022-01-26 NOTE — Progress Notes (Signed)
Returned call to RN concerning CPAP usage this admission.

## 2022-01-26 NOTE — Progress Notes (Signed)
Physical Therapy Treatment Patient Details Name: Danielle [name] MRN: [id number] DOB: 31-May-1945 Today's Date: 01/26/2022   History of Present Illness Patient is a 77 year old female who presents to the emergency department 12/29/21 with concern for abnormal behaviors, recently in hospital for same.patient noted to have "Chronic appearing compression fracture of L3 with approximately 40%  vertebral body height loss anteriorly.   there is no high-grade spinal canal stenosis in the thoracic or lumbar spine. Moderate bilateral neural foraminal narrowing at T1-T2" .  patient underwent kyphoplasty on 1/5. no brace needed per orders. PMH: pseudodementia, depression, anxiety.    PT Comments    Pt agreeable to working with therapy. She is easily distracted internally and by the environment;she can be directed. Pt was Mod A for bed mobility and Min A to stand and ambulate. Assisted pt back to bed end of session. Continue to recommend SNF.     Recommendations for follow up therapy are one component of a multi-disciplinary discharge planning process, led by the attending physician.  Recommendations may be updated based on patient status, additional functional criteria and insurance authorization.  Follow Up Recommendations  Skilled nursing-short term rehab (<3 hours/day)     Assistance Recommended at Discharge Frequent or constant Supervision/Assistance  Patient can return home with the following Assistance with cooking/housework;Assistance with feeding;Direct supervision/assist for medications management;Direct supervision/assist for financial management;Assist for transportation;Help with stairs or ramp for entrance;A little help with walking and/or transfers;A little help with bathing/dressing/bathroom   Equipment Recommendations  None recommended by PT    Recommendations for Other Services       Precautions / Restrictions Precautions Precautions: Fall Precaution Comments: dementia,  Wernicke's type aphasia, back precautions Restrictions Weight Bearing Restrictions: No     Mobility  Bed Mobility Overal bed mobility: Needs Assistance Bed Mobility: Supine to Sit, Sit to Supine     Supine to sit: Min assist Sit to supine: Mod assist   General bed mobility comments: Assist for trunk and bil LEs. Increased time. Cues provided.    Transfers Overall transfer level: Needs assistance Equipment used: Rolling walker (2 wheels) Transfers: Sit to/from Stand Sit to Stand: Min assist           General transfer comment: Assist to power up, stabilize, control descent. Cues for safety, technique, hand placement.    Ambulation/Gait Ambulation/Gait assistance: Min assist Gait Distance (Feet): 15 Feet Assistive device: Rolling walker (2 wheels) Gait Pattern/deviations: Step-through pattern, Decreased stride length       General Gait Details: Assist to manage RW intermittently. Pt fairly steady with walker.   Stairs             Wheelchair Mobility    Modified Rankin (Stroke Patients Only)       Balance Overall balance assessment: Needs assistance         Standing balance support: Bilateral upper extremity supported, During functional activity, Reliant on assistive device for balance Standing balance-Leahy Scale: Poor                              Cognition Arousal/Alertness: Awake/alert Behavior During Therapy: WFL for tasks assessed/performed, Anxious Overall Cognitive Status: No family/caregiver present to determine baseline cognitive functioning Area of Impairment: Orientation, Attention, Following commands, Awareness                 Orientation Level: Disoriented to, Time, Situation, Place Current Attention Level: Focused   Following Commands: Follows one step commands with  increased time                Exercises Hand Exercises Wrist Flexion: 15 reps    General Comments        Pertinent Vitals/Pain Pain  Assessment Pain Assessment: Faces Faces Pain Scale: Hurts little more Pain Location: L hip/pelvis Pain Descriptors / Indicators: Grimacing, Discomfort Pain Intervention(s): Limited activity within patient's tolerance, Repositioned, Monitored during session    Home Living                          Prior Function            PT Goals (current goals can now be found in the care plan section) Progress towards PT goals: Progressing toward goals    Frequency    Min 2X/week      PT Plan Current plan remains appropriate    Co-evaluation              AM-PAC PT "6 Clicks" Mobility   Outcome Measure  Help needed turning from your back to your side while in a flat bed without using bedrails?: A Lot Help needed moving from lying on your back to sitting on the side of a flat bed without using bedrails?: A Lot Help needed moving to and from a bed to a chair (including a wheelchair)?: A Little Help needed standing up from a chair using your arms (e.g., wheelchair or bedside chair)?: A Little Help needed to walk in hospital room?: A Little Help needed climbing 3-5 steps with a railing? : Total 6 Click Score: 14    End of Session Equipment Utilized During Treatment: Gait belt Activity Tolerance: Patient tolerated treatment well;Patient limited by pain Patient left: in bed;with call bell/phone within reach;with bed alarm set;with nursing/sitter in room   PT Visit Diagnosis: Unsteadiness on feet (R26.81);Difficulty in walking, not elsewhere classified (R26.2);Muscle weakness (generalized) (M62.81)     Time: 0277-4128 PT Time Calculation (min) (ACUTE ONLY): 33 min  Charges:  $Gait Training: 8-22 mins $Therapeutic Activity: 8-22 mins                         Doreatha Massed, PT Acute Rehabilitation  Office: [phone]

## 2022-01-26 NOTE — Care Management Important Message (Signed)
Important Message  Patient Details IM Letter given. Name: Danielle [name] MRN: [id number] Date of Birth: July 16, 1945   Medicare Important Message Given:  Yes     Kerin Salen 01/26/2022, 10:27 AM

## 2022-01-27 ENCOUNTER — Encounter (HOSPITAL_COMMUNITY): Payer: Self-pay | Admitting: Internal Medicine

## 2022-01-27 DIAGNOSIS — F03918 Unspecified dementia, unspecified severity, with other behavioral disturbance: Secondary | ICD-10-CM | POA: Diagnosis not present

## 2022-01-27 DIAGNOSIS — F418 Other specified anxiety disorders: Secondary | ICD-10-CM | POA: Diagnosis not present

## 2022-01-27 DIAGNOSIS — R4189 Other symptoms and signs involving cognitive functions and awareness: Secondary | ICD-10-CM | POA: Diagnosis not present

## 2022-01-27 DIAGNOSIS — I10 Essential (primary) hypertension: Secondary | ICD-10-CM | POA: Diagnosis not present

## 2022-01-27 NOTE — TOC Progression Note (Addendum)
Transition of Care [facility]) - Progression Note    Patient Details  Name: Danielle [name] MRN: [id number] Date of Birth: November 16, 1945  Transition of Care [facility]) CM/SW [place], RN Phone Number:[phone]  01/27/2022, 10:35 AM  Clinical Narrative:    Insurance Josem Kaufmann is still pending. Updated PT noted was requested and has been uploaded.   [place] is still pending. Will continue to follow.   1452 CM received call from [facility] with insurance auth approval. Reference # [id number] Plan Auth ID [id number] Approved for 3 days 1/16-1/18. CM spoke with Whitney the intake coordinator for [facility]. Per [place] facility can admit tomorrow 1/17. Md has been updated    Barriers to Discharge: Continued Medical Work up  Expected Discharge Plan and Services In-house Referral: NA Discharge Planning Services: CM Consult Post Acute Care Choice: [place] arrangements for the past 2 months: Single Family Home                   DME Agency: NA       HH Arranged: NA [place] Agency: NA         Social Determinants of Health (SDOH) Interventions SDOH Screenings   Food Insecurity: No Food Insecurity (01/17/2022)  Housing: Low Risk  (01/17/2022)  Transportation Needs: No Transportation Needs (01/17/2022)  Utilities: Not At Risk (01/17/2022)  Alcohol Screen: Low Risk  (11/26/2021)  Depression (PHQ2-9): High Risk (10/09/2021)  Tobacco Use: Low Risk  (01/19/2022)    Readmission Risk Interventions    10/31/2021    5:06 PM  Readmission Risk Prevention Plan  Transportation Screening Complete  PCP or Specialist Appt within 3-5 Days Complete  HRI or [place] Complete  Social Work Consult for [place] Planning/Counseling Complete  Palliative Care Screening Not Applicable  Medication Review Press photographer) Complete

## 2022-01-27 NOTE — Progress Notes (Signed)
Progress Note   Patient: Danielle [name] VVO:[id number] DOB: 1945/05/12 DOA: 12/29/2021     12 DOS: the patient was seen and examined on 01/27/2022 at 11:27AM      Brief hospital course: Danielle [name] is a 77 y.o. F with obesity BMI 34, OSA on CPAP, hypothyroidism, HTN, CAD, depression and pseudodementia who presented with erratic behavior.   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, transferred to [facility] 11/16-11/22: Admitted at [facility] for pseudodementia, discharged to home   12/18: Returned, again with erratic behavior 12/19-20: Psychiatry evaluated, unable to review Neuro notes regarding prior Neuropsych testing, instead, impression was of "cognitive decline", and recommendation was simply for discharge to memory care 12/27: Urine culture sent for no apparent reason, started Cipro 12/29-30: Patient with temp 100.38F, hospitalists consulted for "fever"  12/30: Patient admitted to acute care 12/31: Psychiatry reconsulted, unable to read outpatient Neuro notes, diagnoses "delirium"       12/27: Urine culture multiple species 12/30: Repeat urine culture no growth 1/3: Repeat urine culture no growth 1/4: Blood cultures x2 no growth     Assessment and Plan: * 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  Initially referred to behavioral health and Neurology.  Neurology obtained neurocognitive testing (see excellent summary in Dr. Cathren Laine note from 10/16/21) and her neurocognitive testing did NOT show deficits in those areas of function which would be expected to deteriorate first in a cortical dementia such as Alzheimer's or Lewy body dementia.  Work up for subcortical dementias with MRI brain (in Dec 2023), vitamin B1, homocysteine, MMA, ammonia, RPR, D, and vit B12 levels were also normal.  She was perceived by the Neurology team to have pseudodementia in the setting of her anxiety and depression.  Concurrent care by her  Psychiatrist appears to have been fragmented by frequent visits to the ER and hospital during which medications were changed.  During her last hospitalization in Nov 2023, the patient was evaluated by Psychiatry while inpatient, diagnosed with pseudodementia, discharged to [facility], where she was started on Prozac, Zyprexa and Remeron.  During that 6 day admission, she was evidently more cognitively alert: "Cognitively, she would have a normal conversation with you but then would speak in a tangential way that had nothing to do with the conversation. It was very odd. She was alert and oriented x 3 and pleasant and cooperative but would go into these episodes of being confused." And eventually she was discharged home.     She was home about 3 weeks and then behaviors became erratic again and so family brought her back to the ER.    On return, her medical work up was normal in the ER.  She was evaluated by psychiatry again, and my understanding of their notes is that they did not review her prior neurological work up.  Their brief notes use the term "cognitive decline" and their recommendations imply they believed she had dementia.  When she was eventually admitted for UTI, repeat evaluation by Psychiatry again implies that they were unable to review the excellent Neurology notes by Dr. Lavell Anchors referenced above, but instead had the impression that she was having delirium, and recommended medical management alone (and stopping Prozac, etc for being deliriogenic)  Her clinical course over the last 29 days is not consistent with delirium.  It has not improved with treatment of underlying medical issues, and in my experience, it has not been at all a fluctuating inattention and disorientation, but instead  seems very much like described above, she would make clear sense, and then within moments devolve into gibberish and random words.  Regardless, at this point, although it seems clear she does not have an  organic neurodegenerative disease or reversible medical cause of dementia, she has not been able to return to a level where she can cognitively function at home despite hospitalization in a geriatric psychiatric facility and stabilization on appropriate psychiatric medications, and I agree that referral to SNF with possible memory care capabilities is best.  - PT - TOC consult for placement - Continue Seroquel, propranolol, trazodone at discharge     AKI (acute kidney injury) ([place]) Cr 1.2 on admisison, improved to baseline 0.8 with fluids.  Hypomagnesemia Supplemented  Lumbar compression fracture (HCC) Treated by orthopedics.  This seems resolved.  Purported UTI Urine culture with multiple species. Repeat on admission no growth, repeat again during hospitalization no growth.  No recurrent symptoms. - Continue Flomax  Depression with anxiety - Continue Seroquel, trazodone  Hypothyroidism - Continue levothyroxine  Hyperlipidemia LDL goal <70 - Continue Crestor  Hypertension BP normal - Continue propranolol  OSA (obstructive sleep apnea) During her hospitalization here, the patient has not tolerated CPAP.  In discussion with husband, she was not wearing it at home prior to admission either.   Despite that, she has been medically stable, and showed no ill effects, and no signs of hypercarbia.  It is my medical opinion that given her dementia with behavioral disturbance, it would be exceedingly difficult for her to tolerate CPAP nightly without exacerbating her behavioral problems, and the benefit would be vastly outweighed by this risk. - Stop CPAP          Subjective: No new complaints, no nursing concerns.     Physical Exam: BP 106/83 (BP Location: Left Arm)   Pulse 92   Temp 98.1 F (36.7 C) (Oral)   Resp 20   Ht '5\' 3"'$  (1.6 m)   Wt 87.5 kg   SpO2 95%   BMI 34.17 kg/m   Elderly adult female, sitting up in recliner, interactive and appropriate RRR, no  murmurs, no peripheral edema Respiratory rate normal, lungs clear without rales or wheezes She appears to want something, and can make listed expressions like "please do not leave" and "where am I going to?" But then follow up utterances are stuttered and gibberish and unintelligible.      Data Reviewed: No new labs  Family Communication: None present    Disposition: Status is: Inpatient The patient remains medically stable for discharge when a safe disposition can be arranged        Author: Edwin Dada, MD 01/27/2022 4:09 PM  For on call review [email or web address].

## 2022-01-28 DIAGNOSIS — F32A Depression, unspecified: Secondary | ICD-10-CM | POA: Diagnosis not present

## 2022-01-28 DIAGNOSIS — F419 Anxiety disorder, unspecified: Secondary | ICD-10-CM | POA: Diagnosis not present

## 2022-01-28 DIAGNOSIS — R58 Hemorrhage, not elsewhere classified: Secondary | ICD-10-CM | POA: Diagnosis not present

## 2022-01-28 DIAGNOSIS — R509 Fever, unspecified: Secondary | ICD-10-CM | POA: Diagnosis not present

## 2022-01-28 DIAGNOSIS — R231 Pallor: Secondary | ICD-10-CM | POA: Diagnosis not present

## 2022-01-28 DIAGNOSIS — R6521 Severe sepsis with septic shock: Secondary | ICD-10-CM | POA: Diagnosis not present

## 2022-01-28 DIAGNOSIS — L89626 Pressure-induced deep tissue damage of left heel: Secondary | ICD-10-CM | POA: Diagnosis not present

## 2022-01-28 DIAGNOSIS — E1139 Type 2 diabetes mellitus with other diabetic ophthalmic complication: Secondary | ICD-10-CM | POA: Diagnosis not present

## 2022-01-28 DIAGNOSIS — G4733 Obstructive sleep apnea (adult) (pediatric): Secondary | ICD-10-CM | POA: Diagnosis not present

## 2022-01-28 DIAGNOSIS — F0392 Unspecified dementia, unspecified severity, with psychotic disturbance: Secondary | ICD-10-CM | POA: Diagnosis not present

## 2022-01-28 DIAGNOSIS — N390 Urinary tract infection, site not specified: Secondary | ICD-10-CM | POA: Diagnosis not present

## 2022-01-28 DIAGNOSIS — E785 Hyperlipidemia, unspecified: Secondary | ICD-10-CM

## 2022-01-28 DIAGNOSIS — I10 Essential (primary) hypertension: Secondary | ICD-10-CM | POA: Diagnosis not present

## 2022-01-28 DIAGNOSIS — F03C0 Unspecified dementia, severe, without behavioral disturbance, psychotic disturbance, mood disturbance, and anxiety: Secondary | ICD-10-CM | POA: Diagnosis not present

## 2022-01-28 DIAGNOSIS — Z515 Encounter for palliative care: Secondary | ICD-10-CM | POA: Diagnosis not present

## 2022-01-28 DIAGNOSIS — S32030D Wedge compression fracture of third lumbar vertebra, subsequent encounter for fracture with routine healing: Secondary | ICD-10-CM | POA: Diagnosis not present

## 2022-01-28 DIAGNOSIS — R4189 Other symptoms and signs involving cognitive functions and awareness: Secondary | ICD-10-CM | POA: Diagnosis not present

## 2022-01-28 DIAGNOSIS — A419 Sepsis, unspecified organism: Secondary | ICD-10-CM | POA: Diagnosis not present

## 2022-01-28 DIAGNOSIS — I517 Cardiomegaly: Secondary | ICD-10-CM | POA: Diagnosis not present

## 2022-01-28 DIAGNOSIS — R55 Syncope and collapse: Secondary | ICD-10-CM | POA: Diagnosis not present

## 2022-01-28 DIAGNOSIS — K5289 Other specified noninfective gastroenteritis and colitis: Secondary | ICD-10-CM | POA: Diagnosis not present

## 2022-01-28 DIAGNOSIS — R627 Adult failure to thrive: Secondary | ICD-10-CM | POA: Diagnosis not present

## 2022-01-28 DIAGNOSIS — Z7401 Bed confinement status: Secondary | ICD-10-CM | POA: Diagnosis not present

## 2022-01-28 DIAGNOSIS — K219 Gastro-esophageal reflux disease without esophagitis: Secondary | ICD-10-CM | POA: Diagnosis not present

## 2022-01-28 DIAGNOSIS — R000 Tachycardia, unspecified: Secondary | ICD-10-CM | POA: Diagnosis not present

## 2022-01-28 DIAGNOSIS — I4510 Unspecified right bundle-branch block: Secondary | ICD-10-CM | POA: Diagnosis not present

## 2022-01-28 DIAGNOSIS — M6281 Muscle weakness (generalized): Secondary | ICD-10-CM | POA: Diagnosis not present

## 2022-01-28 DIAGNOSIS — H409 Unspecified glaucoma: Secondary | ICD-10-CM | POA: Diagnosis not present

## 2022-01-28 DIAGNOSIS — R933 Abnormal findings on diagnostic imaging of other parts of digestive tract: Secondary | ICD-10-CM | POA: Diagnosis not present

## 2022-01-28 DIAGNOSIS — R296 Repeated falls: Secondary | ICD-10-CM | POA: Diagnosis not present

## 2022-01-28 DIAGNOSIS — N170 Acute kidney failure with tubular necrosis: Secondary | ICD-10-CM | POA: Diagnosis not present

## 2022-01-28 DIAGNOSIS — M549 Dorsalgia, unspecified: Secondary | ICD-10-CM | POA: Diagnosis not present

## 2022-01-28 DIAGNOSIS — N179 Acute kidney failure, unspecified: Secondary | ICD-10-CM | POA: Diagnosis not present

## 2022-01-28 DIAGNOSIS — K5900 Constipation, unspecified: Secondary | ICD-10-CM | POA: Diagnosis not present

## 2022-01-28 DIAGNOSIS — F418 Other specified anxiety disorders: Secondary | ICD-10-CM | POA: Diagnosis not present

## 2022-01-28 DIAGNOSIS — L89616 Pressure-induced deep tissue damage of right heel: Secondary | ICD-10-CM | POA: Diagnosis not present

## 2022-01-28 DIAGNOSIS — I4891 Unspecified atrial fibrillation: Secondary | ICD-10-CM | POA: Diagnosis not present

## 2022-01-28 DIAGNOSIS — E039 Hypothyroidism, unspecified: Secondary | ICD-10-CM | POA: Diagnosis not present

## 2022-01-28 DIAGNOSIS — A4159 Other Gram-negative sepsis: Secondary | ICD-10-CM | POA: Diagnosis not present

## 2022-01-28 DIAGNOSIS — R791 Abnormal coagulation profile: Secondary | ICD-10-CM | POA: Diagnosis not present

## 2022-01-28 DIAGNOSIS — R442 Other hallucinations: Secondary | ICD-10-CM | POA: Diagnosis not present

## 2022-01-28 DIAGNOSIS — S32030A Wedge compression fracture of third lumbar vertebra, initial encounter for closed fracture: Secondary | ICD-10-CM | POA: Diagnosis not present

## 2022-01-28 DIAGNOSIS — L89152 Pressure ulcer of sacral region, stage 2: Secondary | ICD-10-CM | POA: Diagnosis not present

## 2022-01-28 DIAGNOSIS — E870 Hyperosmolality and hypernatremia: Secondary | ICD-10-CM | POA: Diagnosis not present

## 2022-01-28 DIAGNOSIS — Z66 Do not resuscitate: Secondary | ICD-10-CM | POA: Diagnosis not present

## 2022-01-28 LAB — BASIC METABOLIC PANEL
Anion gap: 9 (ref 5–15)
BUN: 14 mg/dL (ref 8–23)
CO2: 24 mmol/L (ref 22–32)
Calcium: 9.4 mg/dL (ref 8.9–10.3)
Chloride: 106 mmol/L (ref 98–111)
Creatinine, Ser: 0.94 mg/dL (ref 0.44–1.00)
GFR, Estimated: >60 mL/min (ref >60)
Glucose, Bld: 123 mg/dL — ABNORMAL HIGH (ref 70–99)
Potassium: 3.8 mmol/L (ref 3.5–5.1)
Sodium: 139 mmol/L (ref 135–145)

## 2022-01-28 MED ORDER — TAMSULOSIN HCL 0.4 MG PO CAPS: 0.8000 mg | ORAL_CAPSULE | Freq: Every day | ORAL | Status: DC

## 2022-01-28 MED ORDER — PROPRANOLOL HCL 10 MG PO TABS: 10.0000 mg | ORAL_TABLET | Freq: Three times a day (TID) | ORAL | Status: DC

## 2022-01-28 MED ORDER — QUETIAPINE FUMARATE 25 MG PO TABS: 25.0000 mg | ORAL_TABLET | Freq: Two times a day (BID) | ORAL | Status: DC

## 2022-01-28 MED ORDER — ENSURE ENLIVE PO LIQD: 237.0000 mL | Freq: Two times a day (BID) | ORAL | 12 refills | Status: DC

## 2022-01-28 MED ORDER — PANTOPRAZOLE SODIUM 20 MG PO TBEC: 20.0000 mg | DELAYED_RELEASE_TABLET | Freq: Every day | ORAL | Status: DC

## 2022-01-28 MED ORDER — POLYETHYLENE GLYCOL 3350 17 G PO PACK: 17.0000 g | PACK | Freq: Every day | ORAL | 0 refills | Status: DC | PRN

## 2022-01-28 MED ORDER — HYDROXYZINE HCL 10 MG PO TABS: 10.0000 mg | ORAL_TABLET | Freq: Three times a day (TID) | ORAL | 0 refills | Status: DC | PRN

## 2022-01-28 MED ORDER — POTASSIUM CHLORIDE CRYS ER 20 MEQ PO TBCR: 20.0000 meq | EXTENDED_RELEASE_TABLET | Freq: Every day | ORAL | Status: DC

## 2022-01-28 MED ORDER — METHOCARBAMOL 500 MG PO TABS: 500.0000 mg | ORAL_TABLET | Freq: Four times a day (QID) | ORAL | Status: DC | PRN

## 2022-01-28 MED ORDER — HYDROCODONE-ACETAMINOPHEN 5-325 MG PO TABS: 1.0000 | ORAL_TABLET | Freq: Four times a day (QID) | ORAL | 0 refills | Status: DC | PRN

## 2022-01-28 NOTE — Progress Notes (Signed)
I have called several  times to [facility] to give report  but unable to get anyone to talk to. Will try again

## 2022-01-28 NOTE — Discharge Summary (Signed)
Physician Discharge Summary   Patient: Danielle [name] MRN: [id number] DOB: 1945-06-12  Admit date:     12/29/2021  Discharge date: 01/28/22  Discharge Physician: Oswald Hillock   PCP: [name], MD   Recommendations at discharge:   Patient to be discharged to skilled nursing facility Check BMP in 1 week  Discharge Diagnoses: Principal Problem:   Pseudodementia Active Problems:   AKI (acute kidney injury) ([place])   OSA (obstructive sleep apnea)   Hypertension   Hyperlipidemia LDL goal <70   Hypothyroidism   Depression with anxiety   Purported UTI   Lumbar compression fracture ([place])   Hypomagnesemia  Resolved Problems:   * No resolved hospital problems. [facility] Course:  Danielle [name] is a 76 y.o. F with obesity BMI 34, OSA on CPAP, hypothyroidism, HTN, CAD, depression and pseudodementia who presented with erratic behavior.   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, transferred to [facility] 11/16-11/22: Admitted at [facility] for pseudodementia, discharged to home   12/18: Returned, again with erratic behavior 12/19-20: Psychiatry evaluated, unable to review Neuro notes regarding prior Neuropsych testing, instead, impression was of "cognitive decline", and recommendation was simply for discharge to memory care 12/27: Urine culture sent for no apparent reason, started Cipro 12/29-30: Patient with temp 100.69F, hospitalists consulted for "fever"  12/30: Patient admitted to acute care 12/31: Psychiatry reconsulted, unable to read outpatient Neuro notes, diagnoses "delirium"       12/27: Urine culture multiple species 12/30: Repeat urine culture no growth 1/3: Repeat urine culture no growth 1/4: Blood cultures x2 no growth  Assessment and Plan:  Pseudodementia -Dementia/anxiety with depression/altered mental status with hallucinations -Patient was seen by psychiatry, did not recommend inpatient hospitalization -Recommend to  continue with Seroquel, trazodone and hydroxyzine -Prozac and Geodon discontinued by psychiatry  Chronic pain -Presented with chronic pain, she has history of bilateral total hip arthroplasty -MRI thoracic and lumbar spine from 12/18 showed chronic appearing compression fracture of L3 with approximately 40% vertebral body height loss anteriorly -Orthopedics was consulted, s/p kyphoplasty on 01/16/22 per Dr. Rolena Infante  Acute kidney injury -Creatinine was 1.2 on admission -Improved to 0.8 with IV fluids  Hypomagnesemia -Replete  Questionable UTI -Urine culture grew multiple species -Repeat cultures also showed no growth -Continue Flomax  Hypothyroidism -Continue Synthroid  Hyperlipidemia -Continue Crestor  Hypertension -Continue propranolol  Hypokalemia -Continue K-Dur 20 meq  p.o. daily  OSA (obstructive sleep apnea) During her hospitalization here, the patient has not tolerated CPAP.  In discussion with husband, she was not wearing it at home prior to admission either.   Despite that, she has been medically stable, and showed no ill effects, and no signs of hypercarbia.   As per my fellow hospitalist and colleague, Dr Loleta Books, :"it is my medical opinion that given her dementia with behavioral disturbance, it would be exceedingly difficult for her to tolerate CPAP nightly without exacerbating her behavioral problems, and the benefit would be vastly outweighed by this risk. - Stop CPAP".         Consultants: Orthopedics, psychiatry Procedures performed:  Disposition: Skilled nursing facility Diet recommendation:  Discharge Diet Orders (From admission, onward)     Start     Ordered   01/28/22 0000  Diet - low sodium heart healthy        01/28/22 1155           Regular diet DISCHARGE MEDICATION: Allergies as of 01/28/2022       Reactions   Ceftin [  cefuroxime Axetil] Hives, Swelling, Other (See Comments)   Swelling of face   Codeine Nausea Only   Compazine  [prochlorperazine] Swelling, Other (See Comments)   Face and tongue swelling        Medication List     STOP taking these medications    bisacodyl 5 MG EC tablet Commonly known as: DULCOLAX   cariprazine 1.5 MG capsule Commonly known as: Vraylar   FLUoxetine 40 MG capsule Commonly known as: PROzac   ibuprofen 800 MG tablet Commonly known as: ADVIL   metoprolol tartrate 25 MG tablet Commonly known as: LOPRESSOR       TAKE these medications    CALCIUM PO Take 1 tablet by mouth every 7 (seven) days.   feeding supplement Liqd Take 237 mLs by mouth 2 (two) times daily between meals.   HYDROcodone-acetaminophen 5-325 MG tablet Commonly known as: NORCO/VICODIN Take 1 tablet by mouth every 6 (six) hours as needed for moderate pain.   hydrOXYzine 10 MG tablet Commonly known as: ATARAX Take 1 tablet (10 mg total) by mouth 3 (three) times daily as needed for nausea or anxiety. What changed:  medication strength how much to take when to take this reasons to take this   levothyroxine 137 MCG tablet Commonly known as: SYNTHROID Take 137 mcg by mouth daily before breakfast.   Magnesium 100 MG Caps Take 100 mg by mouth every 7 (seven) days.   methocarbamol 500 MG tablet Commonly known as: ROBAXIN Take 1 tablet (500 mg total) by mouth every 6 (six) hours as needed for muscle spasms.   nitroGLYCERIN 0.4 MG SL tablet Commonly known as: NITROSTAT Place 0.4 mg under the tongue every 5 (five) minutes as needed for chest pain.   ondansetron 4 MG tablet Commonly known as: ZOFRAN Take 1 tablet (4 mg total) by mouth every 8 (eight) hours as needed for nausea or vomiting.   pantoprazole 20 MG tablet Commonly known as: PROTONIX Take 1 tablet (20 mg total) by mouth daily.   polyethylene glycol 17 g packet Commonly known as: MIRALAX / GLYCOLAX Take 17 g by mouth daily as needed for mild constipation.   potassium chloride SA 20 MEQ tablet Commonly known as: KLOR-CON  M Take 1 tablet (20 mEq total) by mouth daily.   propranolol 10 MG tablet Commonly known as: INDERAL Take 1 tablet (10 mg total) by mouth 3 (three) times daily.   QUEtiapine 25 MG tablet Commonly known as: SEROQUEL Take 1 tablet (25 mg total) by mouth 2 (two) times daily. What changed:  medication strength how much to take when to take this   rosuvastatin 40 MG tablet Commonly known as: CRESTOR Take 1 tablet (40 mg total) by mouth daily.   senna-docusate 8.6-50 MG tablet Commonly known as: Senokot-S Take 1 tablet by mouth at bedtime.   tamsulosin 0.4 MG Caps capsule Commonly known as: FLOMAX Take 2 capsules (0.8 mg total) by mouth at bedtime.   traZODone 100 MG tablet Commonly known as: DESYREL Take 1 tablet (100 mg total) by mouth at bedtime as needed for sleep. What changed: when to take this   Vitamin D (Ergocalciferol) 1.25 MG (50000 UNIT) Caps capsule Commonly known as: DRISDOL Take 50,000 Units by mouth every Wednesday.               Durable Medical Equipment  (From admission, onward)           Start     Ordered   01/08/22 2151  For home  use only DME Hospital bed  Once       Question Answer Comment  Length of Need Lifetime   Bed type Semi-electric      01/08/22 2150   01/08/22 1743  For home use only DME Hospital bed  Once       Question Answer Comment  Length of Need Lifetime   Bed type Semi-electric      01/08/22 1743   01/08/22 1714  For home use only DME Hospital bed  Once       Question Answer Comment  Length of Need Lifetime   Bed type Semi-electric      01/08/22 1713            Follow-up Information     Melina Schools, MD. Schedule an appointment as soon as possible for a visit in 2 week(s).   Specialty: Orthopedic Surgery Why: For suture removal, For wound re-check, If symptoms worsen Contact information: [address] [place] [address] [phone]                Discharge Exam: Danley Danker  Weights   12/29/21 0908 01/16/22 1453  Weight: 87.5 kg 87.5 kg   General-appears in no acute distress Heart-S1-S2, regular, no murmur auscultated Lungs-clear to auscultation bilaterally, no wheezing or crackles auscultated Abdomen-soft, nontender, no organomegaly Extremities-no edema in the lower extremities Neuro-alert, oriented x3, no focal deficit noted  Condition at discharge: good  The results of significant diagnostics from this hospitalization (including imaging, microbiology, ancillary and laboratory) are listed below for reference.   Imaging Studies: VAS [place] LOWER EXTREMITY VENOUS (DVT)  Result Date: 01/20/2022  Lower Venous DVT Study Patient Name:  BELLAMI FARRELLY  Date of Exam:   01/20/2022 Medical Rec #: [id number]           Accession #:    [id number] Date of Birth: February 06, 1945           Patient Gender: F Patient Age:   57 years Exam Location:  [facility] Procedure:      VAS [place] LOWER EXTREMITY VENOUS (DVT) Referring Phys: Annamaria Boots XU --------------------------------------------------------------------------------  Indications: Edema.  Risk Factors: None identified. Limitations: Poor ultrasound/tissue interface and patient positioning, patient movement. Comparison Study: No prior studies. Performing Technologist: Oliver Hum RVT  Examination Guidelines: A complete evaluation includes B-mode imaging, spectral Doppler, color Doppler, and power Doppler as needed of all accessible portions of each vessel. Bilateral testing is considered an integral part of a complete examination. Limited examinations for reoccurring indications may be performed as noted. The reflux portion of the exam is performed with the patient in reverse Trendelenburg.  +-----+---------------+---------+-----------+----------+--------------+ RIGHTCompressibilityPhasicitySpontaneityPropertiesThrombus Aging +-----+---------------+---------+-----------+----------+--------------+ CFV  Full           Yes       Yes                                 +-----+---------------+---------+-----------+----------+--------------+   +---------+---------------+---------+-----------+----------+-------------------+ LEFT     CompressibilityPhasicitySpontaneityPropertiesThrombus Aging      +---------+---------------+---------+-----------+----------+-------------------+ CFV      Full           Yes      Yes                                      +---------+---------------+---------+-----------+----------+-------------------+ SFJ      Full                                                             +---------+---------------+---------+-----------+----------+-------------------+  FV Prox  Full                                                             +---------+---------------+---------+-----------+----------+-------------------+ FV Mid   Full                                                             +---------+---------------+---------+-----------+----------+-------------------+ FV DistalFull           Yes      Yes                                      +---------+---------------+---------+-----------+----------+-------------------+ PFV      Full                                                             +---------+---------------+---------+-----------+----------+-------------------+ POP      Full           Yes      Yes                                      +---------+---------------+---------+-----------+----------+-------------------+ PTV      Full                                                             +---------+---------------+---------+-----------+----------+-------------------+ PERO                                                  Not well visualized +---------+---------------+---------+-----------+----------+-------------------+    Summary: RIGHT: - No evidence of common femoral vein obstruction.  LEFT: - There is no evidence of deep vein thrombosis in the  lower extremity. However, portions of this examination were limited- see technologist comments above.  - No cystic structure found in the popliteal fossa.  *See table(s) above for measurements and observations. Electronically signed by Servando Snare MD on 01/20/2022 at 3:03:55 PM.    Final    DG Lumbar Spine 2-3 Views  Result Date: 01/16/2022 CLINICAL DATA:  L3 kyphoplasty EXAM: LUMBAR SPINE - 2-3 VIEW COMPARISON:  12/29/2021 FINDINGS: A single fluoroscopic images obtained during the performance of the procedure and is provided for interpretation. Vertebral augmentation is seen at the L3 level. Please refer to operative report. Fluoroscopy time: 1.0 minute, 28.932 mGy IMPRESSION: 1. Intraoperative evaluation as above. Electronically Signed   By: Randa Ngo M.D.   On: 01/16/2022 18:33   DG C-Arm 1-60  Min-No Report  Result Date: 01/16/2022 Fluoroscopy was utilized by the requesting physician.  No radiographic interpretation.   DG C-Arm 1-60 Min-No Report  Result Date: 01/16/2022 Fluoroscopy was utilized by the requesting physician.  No radiographic interpretation.   DG C-Arm 1-60 Min-No Report  Result Date: 01/16/2022 Fluoroscopy was utilized by the requesting physician.  No radiographic interpretation.   DG C-Arm 1-60 Min-No Report  Result Date: 01/16/2022 Fluoroscopy was utilized by the requesting physician.  No radiographic interpretation.   MR LUMBAR SPINE WO CONTRAST  Result Date: 12/29/2021 CLINICAL DATA:  Myelopathy; psychiatric evaluation EXAM: MRI THORACIC AND LUMBAR SPINE WITHOUT CONTRAST TECHNIQUE: Multiplanar and multiecho pulse sequences of the thoracic and lumbar spine were obtained without intravenous contrast. COMPARISON:  None Available. FINDINGS: Evaluation is limited by motion artifact. This particularly limits evaluation of the lumbar spine, as no axial sequences could be obtained, and the STIR sequence is nondiagnostic. The thoracic axial medic sequence is also nondiagnostic.  MRI THORACIC SPINE FINDINGS Alignment: Mild S shaped curvature of the thoracolumbar spine. No listhesis. Vertebrae: No fracture, evidence of discitis, or bone lesion. Cord:  Normal signal and morphology. Paraspinal and other soft tissues: Negative. Disc levels: No high-grade spinal canal stenosis. Moderate bilateral neural foraminal narrowing at T1-T2. MRI LUMBAR SPINE FINDINGS Segmentation: 5 lumbar type vertebral bodies are presumed. The last fully formed disc space is labeled L5-S1. Alignment:  3 mm anterolisthesis L5 on S1. Vertebrae: No acute fracture or suspicious osseous lesion. Chronic appearing compression fracture of L3 with approximately 40% vertebral body height loss anteriorly. T1 and T2 hyperintense foci, the largest of which is in L1, consistent with benign hemangiomas. Conus medullaris and cauda equina: Conus extends to the L1-L2 level. Evaluation of the conus and cauda equina is limited. Paraspinal and other soft tissues: Negative. Disc levels: No high-grade spinal canal stenosis. Evaluation of the neural foramina is limited by motion. IMPRESSION: 1. Evaluation is limited by motion artifact. This particularly limits evaluation of the lumbar spine, as no axial sequences could be obtained, and the STIR sequence is nondiagnostic. 2. Within this limitation, there is no high-grade spinal canal stenosis in the thoracic or lumbar spine. Moderate bilateral neural foraminal narrowing at T1-T2. The neural foramina in the lumbar spine cannot be evaluated. If there is persistent concern for spinal pathology, consider repeating the exam when the patient is better able to tolerate it. Electronically Signed   By: Merilyn Baba M.D.   On: 12/29/2021 23:19   MR THORACIC SPINE WO CONTRAST  Result Date: 12/29/2021 CLINICAL DATA:  Myelopathy; psychiatric evaluation EXAM: MRI THORACIC AND LUMBAR SPINE WITHOUT CONTRAST TECHNIQUE: Multiplanar and multiecho pulse sequences of the thoracic and lumbar spine were  obtained without intravenous contrast. COMPARISON:  None Available. FINDINGS: Evaluation is limited by motion artifact. This particularly limits evaluation of the lumbar spine, as no axial sequences could be obtained, and the STIR sequence is nondiagnostic. The thoracic axial medic sequence is also nondiagnostic. MRI THORACIC SPINE FINDINGS Alignment: Mild S shaped curvature of the thoracolumbar spine. No listhesis. Vertebrae: No fracture, evidence of discitis, or bone lesion. Cord:  Normal signal and morphology. Paraspinal and other soft tissues: Negative. Disc levels: No high-grade spinal canal stenosis. Moderate bilateral neural foraminal narrowing at T1-T2. MRI LUMBAR SPINE FINDINGS Segmentation: 5 lumbar type vertebral bodies are presumed. The last fully formed disc space is labeled L5-S1. Alignment:  3 mm anterolisthesis L5 on S1. Vertebrae: No acute fracture or suspicious osseous lesion. Chronic appearing compression fracture of L3 with  approximately 40% vertebral body height loss anteriorly. T1 and T2 hyperintense foci, the largest of which is in L1, consistent with benign hemangiomas. Conus medullaris and cauda equina: Conus extends to the L1-L2 level. Evaluation of the conus and cauda equina is limited. Paraspinal and other soft tissues: Negative. Disc levels: No high-grade spinal canal stenosis. Evaluation of the neural foramina is limited by motion. IMPRESSION: 1. Evaluation is limited by motion artifact. This particularly limits evaluation of the lumbar spine, as no axial sequences could be obtained, and the STIR sequence is nondiagnostic. 2. Within this limitation, there is no high-grade spinal canal stenosis in the thoracic or lumbar spine. Moderate bilateral neural foraminal narrowing at T1-T2. The neural foramina in the lumbar spine cannot be evaluated. If there is persistent concern for spinal pathology, consider repeating the exam when the patient is better able to tolerate it. Electronically  Signed   By: Merilyn Baba M.D.   On: 12/29/2021 23:19   MR BRAIN WO CONTRAST  Result Date: 12/29/2021 CLINICAL DATA:  Altered mental status. EXAM: MRI HEAD WITHOUT CONTRAST TECHNIQUE: Multiplanar, multiecho pulse sequences of the brain and surrounding structures were obtained without intravenous contrast. COMPARISON:  Same-day CT head, brain MRI 11/23/2021. FINDINGS: Image quality is degraded by motion artifact, particularly affecting the axial T2 sequence. The exam could not be completed; axial T1 and coronal T2 images were not obtained. Brain: There is no evidence of acute intracranial hemorrhage, extra-axial fluid collection, or acute infarct (the diffusion sequence is of diagnostic quality. Parenchymal volume is normal. The ventricles are normal in size. Gray-white differentiation is preserved. Scattered small foci of FLAIR signal abnormality in the supratentorial white matter are nonspecific, likely reflecting mild chronic small-vessel ischemic change There is no mass lesion.  There is no mass effect or midline shift. Vascular: Suboptimally assessed due to motion artifact. No definite abnormality. Skull and upper cervical spine: Normal marrow signal. Sinuses/Orbits: Grossly unremarkable. Other: None. IMPRESSION: No acute intracranial pathology. Electronically Signed   By: Valetta Mole M.D.   On: 12/29/2021 14:24    Microbiology: Results for orders placed or performed during the hospital encounter of 12/29/21  Urine Culture     Status: Abnormal   Collection Time: 12/29/21 12:52 AM   Specimen: Urine, Clean Catch  Result Value Ref Range Status   Specimen Description   Final    URINE, CLEAN CATCH Performed at [facility], [place] [address] [place], [place] [address]    Special Requests   Final    NONE Performed at [facility], [place] [address] [place], [place] [address]    Culture (A)  Final    <10,000 COLONIES/mL INSIGNIFICANT GROWTH Performed at [place] [address], [place], [place] [address]    Report Status 12/31/2021 FINAL  Final  Urine Culture     Status: Abnormal   Collection Time: 01/08/22  2:40 AM   Specimen: Urine, Clean Catch  Result Value Ref Range Status   Specimen Description   Final    URINE, CLEAN CATCH Performed at [facility], [place] [address] [place], [place] [address]    Special Requests   Final    NONE Performed at [facility], [place] [address], [place], [place] [address]    Culture MULTIPLE SPECIES PRESENT, SUGGEST RECOLLECTION (A)  Final   Report Status 01/09/2022 FINAL  Final  Urine Culture     Status: None   Collection Time: 01/10/22 11:15 AM  Specimen: In/Out Cath Urine  Result Value Ref Range Status   Specimen Description   Final    IN/OUT CATH URINE Performed at [place] [address] [place], [place] [address]    Special Requests   Final    NONE Performed at [facility], [place] [address], [place], [place] [address]    Culture   Final    NO GROWTH Performed at [facility], [place] [address] [place], [place] [address]    Report Status 01/11/2022 FINAL  Final  Urine Culture     Status: None   Collection Time: 01/14/22  4:30 PM   Specimen: Urine, Clean Catch  Result Value Ref Range Status   Specimen Description   Final    URINE, CLEAN CATCH Performed at [facility], [place] [address], [place], [place] [address]    Special Requests   Final    NONE Performed at [facility], [place] [address] [place], [place] [address]    Culture   Final    NO GROWTH Performed at [facility], [place] [address], [place], [place] [address]    Report Status 01/15/2022 FINAL  Final  Culture, blood (Routine X 2) w Reflex to ID Panel     Status: None   Collection Time: 01/15/22 11:54 AM   Specimen: BLOOD LEFT ARM  Result Value Ref Range Status   Specimen Description   Final     BLOOD LEFT ARM Performed at [place] [address], [place], [place] [address]    Special Requests   Final    BOTTLES DRAWN AEROBIC ONLY Blood Culture adequate volume Performed at [place] [address], [place], [place] [address]    Culture   Final    NO GROWTH 5 DAYS Performed at [facility], [place] [address], [place], [place] [address]    Report Status 01/20/2022 FINAL  Final  Culture, blood (Routine X 2) w Reflex to ID Panel     Status: None   Collection Time: 01/15/22 11:54 AM   Specimen: BLOOD RIGHT HAND  Result Value Ref Range Status   Specimen Description   Final    BLOOD RIGHT HAND Performed at [place] [address], [place], [place] [address]    Special Requests   Final    BOTTLES DRAWN AEROBIC ONLY Blood Culture adequate volume Performed at [place] [address], [place], [place] [address]    Culture   Final    NO GROWTH 5 DAYS Performed at [facility], [place] [address], [place], [place] [address]    Report Status 01/20/2022 FINAL  Final  Surgical PCR screen     Status: None   Collection Time: 01/16/22  2:21 PM   Specimen: Nasal Mucosa; Nasal Swab  Result Value Ref Range Status   MRSA, PCR NEGATIVE NEGATIVE Final   Staphylococcus aureus NEGATIVE NEGATIVE Final    Comment: (NOTE) The Xpert SA Assay (FDA approved for NASAL specimens in patients 62 years of age and older), is one component of a comprehensive surveillance program. It is not intended to diagnose infection nor to guide or monitor treatment. Performed at [facility], [place] [address], [place], [place] [address]     Labs: CBC: Recent Labs  Lab 01/23/22 0754  WBC 6.2  HGB 12.9  HCT 39.4  MCV 91.4  PLT 242  Basic Metabolic Panel: Recent Labs  Lab 01/23/22 0754 01/28/22 1023  NA 141 139  K 4.2 3.8  CL 108 106  CO2 25 24  GLUCOSE 94 123*  BUN 13 14   CREATININE 0.91 0.94  CALCIUM 9.6 9.4   Liver Function Tests: Recent Labs  Lab 01/23/22 0754  AST 14*  ALT 11  ALKPHOS 67  BILITOT 0.6  PROT 5.8*  ALBUMIN 3.3*   CBG: No results for input(s): "GLUCAP" in the last 168 hours.  Discharge time spent: greater than 30 minutes.  Signed: Oswald Hillock, MD [facility] 01/28/2022

## 2022-01-28 NOTE — Progress Notes (Signed)
Called report to Colletta Maryland , Therapist, sports at [facility].

## 2022-01-28 NOTE — TOC Transition Note (Signed)
Transition of Care [facility]) - CM/SW Discharge Note   Patient Details  Name: Danielle [name] MRN: [id number] Date of Birth: October 09, 1945  Transition of Care [facility]) CM/SW Contact:  Angelita Ingles, RN Phone Number:[phone]  01/28/2022, 1:08 PM   Clinical Narrative:    Patient ok to discharge to [facility] per [facility] admissions coordinator. Husband on unit and made aware. Transportation arranged via [place]. Discharge packet is at nurses station.No other needs noted TOC will sign off.     Barriers to Discharge: Continued Medical Work up   Patient Goals and CMS Choice CMS Medicare.gov Compare Post Acute Care list provided to:: Patient Represenative (must comment) [name] (spouse)) Choice offered to / list presented to : Spouse  Discharge Placement                         Discharge Plan and Services Additional resources added to the After Visit Summary for   In-house Referral: NA Discharge Planning Services: CM Consult Post Acute Care Choice: [place]            DME Agency: NA       HH Arranged: NA HH Agency: NA        Social Determinants of Health (SDOH) Interventions SDOH Screenings   Food Insecurity: No Food Insecurity (01/17/2022)  Housing: Low Risk  (01/17/2022)  Transportation Needs: No Transportation Needs (01/17/2022)  Utilities: Not At Risk (01/17/2022)  Alcohol Screen: Low Risk  (11/26/2021)  Depression (PHQ2-9): High Risk (10/09/2021)  Tobacco Use: Low Risk  (01/27/2022)     Readmission Risk Interventions    10/31/2021    5:06 PM  Readmission Risk Prevention Plan  Transportation Screening Complete  PCP or Specialist Appt within 3-5 Days Complete  HRI or [place] Complete  Social Work Consult for [place] Planning/Counseling Complete  Palliative Care Screening Not Applicable  Medication Review Press photographer) Complete

## 2022-01-29 DIAGNOSIS — N390 Urinary tract infection, site not specified: Secondary | ICD-10-CM | POA: Diagnosis not present

## 2022-01-29 DIAGNOSIS — G4733 Obstructive sleep apnea (adult) (pediatric): Secondary | ICD-10-CM | POA: Diagnosis not present

## 2022-01-29 DIAGNOSIS — R4189 Other symptoms and signs involving cognitive functions and awareness: Secondary | ICD-10-CM | POA: Diagnosis not present

## 2022-01-29 DIAGNOSIS — E785 Hyperlipidemia, unspecified: Secondary | ICD-10-CM | POA: Diagnosis not present

## 2022-01-29 DIAGNOSIS — K219 Gastro-esophageal reflux disease without esophagitis: Secondary | ICD-10-CM | POA: Diagnosis not present

## 2022-01-29 DIAGNOSIS — I10 Essential (primary) hypertension: Secondary | ICD-10-CM | POA: Diagnosis not present

## 2022-01-29 DIAGNOSIS — M549 Dorsalgia, unspecified: Secondary | ICD-10-CM | POA: Diagnosis not present

## 2022-01-29 DIAGNOSIS — N179 Acute kidney failure, unspecified: Secondary | ICD-10-CM | POA: Diagnosis not present

## 2022-02-05 DIAGNOSIS — E785 Hyperlipidemia, unspecified: Secondary | ICD-10-CM | POA: Diagnosis not present

## 2022-02-05 DIAGNOSIS — N390 Urinary tract infection, site not specified: Secondary | ICD-10-CM | POA: Diagnosis not present

## 2022-02-05 DIAGNOSIS — I10 Essential (primary) hypertension: Secondary | ICD-10-CM | POA: Diagnosis not present

## 2022-02-05 DIAGNOSIS — N179 Acute kidney failure, unspecified: Secondary | ICD-10-CM | POA: Diagnosis not present

## 2022-02-05 DIAGNOSIS — G4733 Obstructive sleep apnea (adult) (pediatric): Secondary | ICD-10-CM | POA: Diagnosis not present

## 2022-02-05 DIAGNOSIS — K219 Gastro-esophageal reflux disease without esophagitis: Secondary | ICD-10-CM | POA: Diagnosis not present

## 2022-02-05 DIAGNOSIS — S32030A Wedge compression fracture of third lumbar vertebra, initial encounter for closed fracture: Secondary | ICD-10-CM | POA: Diagnosis not present

## 2022-02-05 DIAGNOSIS — E039 Hypothyroidism, unspecified: Secondary | ICD-10-CM | POA: Diagnosis not present

## 2022-02-05 DIAGNOSIS — M549 Dorsalgia, unspecified: Secondary | ICD-10-CM | POA: Diagnosis not present

## 2022-02-06 DIAGNOSIS — F03C0 Unspecified dementia, severe, without behavioral disturbance, psychotic disturbance, mood disturbance, and anxiety: Secondary | ICD-10-CM | POA: Diagnosis not present

## 2022-02-06 DIAGNOSIS — R627 Adult failure to thrive: Secondary | ICD-10-CM | POA: Diagnosis not present

## 2022-02-07 DIAGNOSIS — I4510 Unspecified right bundle-branch block: Secondary | ICD-10-CM | POA: Diagnosis not present

## 2022-02-07 DIAGNOSIS — R58 Hemorrhage, not elsewhere classified: Secondary | ICD-10-CM | POA: Diagnosis not present

## 2022-02-07 DIAGNOSIS — E039 Hypothyroidism, unspecified: Secondary | ICD-10-CM | POA: Diagnosis not present

## 2022-02-07 DIAGNOSIS — Z515 Encounter for palliative care: Secondary | ICD-10-CM | POA: Diagnosis not present

## 2022-02-07 DIAGNOSIS — R6521 Severe sepsis with septic shock: Secondary | ICD-10-CM | POA: Diagnosis not present

## 2022-02-07 DIAGNOSIS — R000 Tachycardia, unspecified: Secondary | ICD-10-CM | POA: Diagnosis not present

## 2022-02-07 DIAGNOSIS — R231 Pallor: Secondary | ICD-10-CM | POA: Diagnosis not present

## 2022-02-07 DIAGNOSIS — L89152 Pressure ulcer of sacral region, stage 2: Secondary | ICD-10-CM | POA: Diagnosis not present

## 2022-02-07 DIAGNOSIS — E870 Hyperosmolality and hypernatremia: Secondary | ICD-10-CM | POA: Diagnosis not present

## 2022-02-07 DIAGNOSIS — L89616 Pressure-induced deep tissue damage of right heel: Secondary | ICD-10-CM | POA: Diagnosis not present

## 2022-02-07 DIAGNOSIS — K5900 Constipation, unspecified: Secondary | ICD-10-CM | POA: Diagnosis not present

## 2022-02-07 DIAGNOSIS — I517 Cardiomegaly: Secondary | ICD-10-CM | POA: Diagnosis not present

## 2022-02-07 DIAGNOSIS — L89626 Pressure-induced deep tissue damage of left heel: Secondary | ICD-10-CM | POA: Diagnosis not present

## 2022-02-07 DIAGNOSIS — R55 Syncope and collapse: Secondary | ICD-10-CM | POA: Diagnosis not present

## 2022-02-07 DIAGNOSIS — A4159 Other Gram-negative sepsis: Secondary | ICD-10-CM | POA: Diagnosis not present

## 2022-02-07 DIAGNOSIS — N170 Acute kidney failure with tubular necrosis: Secondary | ICD-10-CM | POA: Diagnosis not present

## 2022-02-07 DIAGNOSIS — I4891 Unspecified atrial fibrillation: Secondary | ICD-10-CM | POA: Diagnosis not present

## 2022-02-07 DIAGNOSIS — N179 Acute kidney failure, unspecified: Secondary | ICD-10-CM | POA: Diagnosis not present

## 2022-02-07 DIAGNOSIS — E785 Hyperlipidemia, unspecified: Secondary | ICD-10-CM | POA: Diagnosis not present

## 2022-02-07 DIAGNOSIS — R933 Abnormal findings on diagnostic imaging of other parts of digestive tract: Secondary | ICD-10-CM | POA: Diagnosis not present

## 2022-02-07 DIAGNOSIS — K5289 Other specified noninfective gastroenteritis and colitis: Secondary | ICD-10-CM | POA: Diagnosis not present

## 2022-02-07 DIAGNOSIS — Z66 Do not resuscitate: Secondary | ICD-10-CM | POA: Diagnosis not present

## 2022-02-07 DIAGNOSIS — I10 Essential (primary) hypertension: Secondary | ICD-10-CM | POA: Diagnosis not present

## 2022-02-07 DIAGNOSIS — A419 Sepsis, unspecified organism: Secondary | ICD-10-CM | POA: Diagnosis not present

## 2022-02-13 ENCOUNTER — Ambulatory Visit: Payer: Medicare HMO | Admitting: Obstetrics and Gynecology

## 2022-03-02 ENCOUNTER — Encounter: Payer: Self-pay | Admitting: *Deleted

## 2022-03-13 DEATH — deceased
